# Patient Record
Sex: Male | Born: 1945
Health system: Southern US, Community
[De-identification: ages and names within clinical notes are randomized; demographics above are authoritative.]

## PROBLEM LIST (undated history)

## (undated) DIAGNOSIS — J4489 Other specified chronic obstructive pulmonary disease: Secondary | ICD-10-CM

## (undated) DIAGNOSIS — G4733 Obstructive sleep apnea (adult) (pediatric): Secondary | ICD-10-CM

## (undated) DIAGNOSIS — R0602 Shortness of breath: Secondary | ICD-10-CM

## (undated) DIAGNOSIS — I70213 Atherosclerosis of native arteries of extremities with intermittent claudication, bilateral legs: Secondary | ICD-10-CM

## (undated) DIAGNOSIS — J189 Pneumonia, unspecified organism: Secondary | ICD-10-CM

## (undated) DIAGNOSIS — G629 Polyneuropathy, unspecified: Secondary | ICD-10-CM

## (undated) DIAGNOSIS — J9611 Chronic respiratory failure with hypoxia: Secondary | ICD-10-CM

## (undated) DIAGNOSIS — I2510 Atherosclerotic heart disease of native coronary artery without angina pectoris: Secondary | ICD-10-CM

## (undated) DIAGNOSIS — J449 Chronic obstructive pulmonary disease, unspecified: Secondary | ICD-10-CM

## (undated) DIAGNOSIS — R600 Localized edema: Secondary | ICD-10-CM

## (undated) DIAGNOSIS — Z955 Presence of coronary angioplasty implant and graft: Secondary | ICD-10-CM

## (undated) DIAGNOSIS — G709 Myoneural disorder, unspecified: Secondary | ICD-10-CM

## (undated) DIAGNOSIS — G4730 Sleep apnea, unspecified: Secondary | ICD-10-CM

## (undated) DIAGNOSIS — I10 Essential (primary) hypertension: Secondary | ICD-10-CM

## (undated) DIAGNOSIS — M48061 Spinal stenosis, lumbar region without neurogenic claudication: Secondary | ICD-10-CM

## (undated) DIAGNOSIS — H269 Unspecified cataract: Secondary | ICD-10-CM

## (undated) DIAGNOSIS — I739 Peripheral vascular disease, unspecified: Secondary | ICD-10-CM

## (undated) DIAGNOSIS — E119 Type 2 diabetes mellitus without complications: Secondary | ICD-10-CM

## (undated) DIAGNOSIS — E785 Hyperlipidemia, unspecified: Secondary | ICD-10-CM

## (undated) DIAGNOSIS — I509 Heart failure, unspecified: Secondary | ICD-10-CM

## (undated) DIAGNOSIS — M1990 Unspecified osteoarthritis, unspecified site: Secondary | ICD-10-CM

## (undated) DIAGNOSIS — L719 Rosacea, unspecified: Secondary | ICD-10-CM

## (undated) DIAGNOSIS — Z9989 Dependence on other enabling machines and devices: Secondary | ICD-10-CM

## (undated) DIAGNOSIS — I5032 Chronic diastolic (congestive) heart failure: Secondary | ICD-10-CM

## (undated) HISTORY — PX: HAMMER TOE SURGERY: SHX385

## (undated) HISTORY — PX: CORONARY ANGIOPLASTY: SHX604

## (undated) HISTORY — DX: Myoneural disorder, unspecified: G70.9

## (undated) HISTORY — DX: Obstructive sleep apnea (adult) (pediatric): G47.33

## (undated) HISTORY — PX: BACK SURGERY: SHX140

## (undated) HISTORY — PX: COLONOSCOPY W/ BIOPSIES AND POLYPECTOMY: SHX1376

## (undated) HISTORY — DX: Chronic respiratory failure with hypoxia: J96.11

## (undated) HISTORY — DX: Rosacea, unspecified: L71.9

## (undated) HISTORY — DX: Chronic obstructive pulmonary disease, unspecified: J44.9

## (undated) HISTORY — PX: CARDIAC CATHETERIZATION: SHX172

## (undated) HISTORY — DX: Sleep apnea, unspecified: G47.30

## (undated) HISTORY — PX: LUMBAR DISC SURGERY: SHX700

## (undated) HISTORY — DX: Peripheral vascular disease, unspecified: I73.9

## (undated) HISTORY — PX: BUNIONECTOMY: SHX129

## (undated) HISTORY — PX: LUMBAR SPINE SURGERY: SHX701

## (undated) HISTORY — DX: Unspecified cataract: H26.9

---

## 1963-12-28 HISTORY — PX: TONSILLECTOMY: SUR1361

## 1994-12-27 HISTORY — PX: NEUROMA SURGERY: SHX722

## 1997-12-27 DIAGNOSIS — J189 Pneumonia, unspecified organism: Secondary | ICD-10-CM

## 1997-12-27 HISTORY — DX: Pneumonia, unspecified organism: J18.9

## 1999-03-26 HISTORY — PX: OTHER SURGICAL HISTORY: SHX169

## 1999-03-26 HISTORY — PX: CARDIAC CATHETERIZATION: SHX172

## 2000-06-02 HISTORY — PX: ANGIOPLASTY: SHX39

## 2000-06-02 HISTORY — PX: CORONARY ANGIOPLASTY WITH STENT PLACEMENT: SHX49

## 2001-10-27 HISTORY — PX: CERVICAL FUSION: SHX112

## 2001-10-27 HISTORY — PX: ANTERIOR CERVICAL DECOMP/DISCECTOMY FUSION: SHX1161

## 2004-04-21 HISTORY — PX: SPINE SURGERY: SHX786

## 2006-09-30 HISTORY — PX: CARPAL TUNNEL RELEASE: SHX101

## 2008-04-29 ENCOUNTER — Encounter: Admission: RE | Admit: 2008-04-29 | Discharge: 2008-04-29 | Payer: Self-pay

## 2009-09-26 HISTORY — PX: HAND ARTHROPLASTY: SHX968

## 2009-09-26 HISTORY — PX: FINGER ARTHRODESIS: SHX5000

## 2012-05-18 ENCOUNTER — Other Ambulatory Visit: Payer: Self-pay | Admitting: Chiropractic Medicine

## 2012-05-18 ENCOUNTER — Ambulatory Visit
Admission: RE | Admit: 2012-05-18 | Discharge: 2012-05-18 | Disposition: A | Discharge status: Home / Self Care | Payer: BC Managed Care – PPO | Source: Ambulatory Visit | Attending: Chiropractic Medicine | Admitting: Chiropractic Medicine

## 2012-05-18 DIAGNOSIS — R52 Pain, unspecified: Secondary | ICD-10-CM

## 2012-08-02 ENCOUNTER — Encounter (HOSPITAL_COMMUNITY): Payer: Self-pay

## 2012-08-02 ENCOUNTER — Ambulatory Visit (HOSPITAL_COMMUNITY)
Admission: RE | Admit: 2012-08-02 | Discharge: 2012-08-02 | Disposition: A | Discharge status: Home / Self Care | Payer: MEDICARE | Source: Ambulatory Visit | Attending: Internal Medicine | Admitting: Internal Medicine

## 2012-08-02 VITALS — BP 154/62 | HR 95 | Ht 70.0 in | Wt 283.8 lb

## 2012-08-02 DIAGNOSIS — J9610 Chronic respiratory failure, unspecified whether with hypoxia or hypercapnia: Secondary | ICD-10-CM

## 2012-08-02 DIAGNOSIS — R0602 Shortness of breath: Secondary | ICD-10-CM

## 2012-08-02 DIAGNOSIS — R0902 Hypoxemia: Secondary | ICD-10-CM

## 2012-08-02 DIAGNOSIS — Z6841 Body Mass Index (BMI) 40.0 and over, adult: Secondary | ICD-10-CM

## 2012-08-02 DIAGNOSIS — I2510 Atherosclerotic heart disease of native coronary artery without angina pectoris: Secondary | ICD-10-CM

## 2012-08-02 DIAGNOSIS — J9611 Chronic respiratory failure with hypoxia: Secondary | ICD-10-CM

## 2012-08-02 HISTORY — DX: Hyperlipidemia, unspecified: E78.5

## 2012-08-02 HISTORY — DX: Type 2 diabetes mellitus without complications: E11.9

## 2012-08-02 HISTORY — DX: Atherosclerotic heart disease of native coronary artery without angina pectoris: I25.10

## 2012-08-02 HISTORY — DX: Heart failure, unspecified: I50.9

## 2012-08-02 HISTORY — DX: Spinal stenosis, lumbar region without neurogenic claudication: M48.061

## 2012-08-02 NOTE — Progress Notes (Signed)
PCP: Creola Corn, MD  HPI:  Anthony [name] is a 66 y/o male with h/o obesity, HTN, HL, severe osteoarthritis, DM2 (diagnosed 2009), OSA (intolerant of CPAP), former smoker (quit 1989), CAD s/p stent 2001.  Recently moved here from [place] and here to establish cardiac care. Had cath in 2001. Was told he had 70% blockage in one artery and the one in the back was totally blocked. Eventually underwent stenting of the 70%. No caths since. Had stress test every year. Last one was May 2012. Was told it was normal.  Overall doing fairly well. BP and cholesterol have been well controlled. Has struggled with severe orthopedic issues. Is to the point now where does almost no exercise. Failed sleep study many years ago. Tried several masks but couldn't tolerate. Very fatigued during the day. + excessive daytime sleepiness. Wife says he gurgles a lot at night and has witnessed apnea.   No CP. Occasional mild tightness. Dyspnea at moderate activity. Feels like his limitation getting worse. Occasional edema. No orthopnea or PND.     Review of Systems:     Cardiac Review of Systems: {Y] = yes [ ]  = no  Chest Pain [    ]  Resting SOB [   ] Exertional SOB  Cove.Etienne  ]  Kenese.Mounts [  ]   Pedal Edema Cove.Etienne   ]    Palpitations [  ] Syncope  [  ]   Presyncope [   ]  General Review of Systems: [Y] = yes [  ]=no Constitional: recent weight change [  ]; anorexia [  ]; fatigue Cove.Etienne  ]; nausea [  ]; night sweats [  ]; fever [  ]; or chills [  ];                                                                                                                                          Dental: poor dentition[  ];   Eye : blurred vision [  ]; diplopia [   ]; vision changes [  ];  Amaurosis fugax[  ]; Resp: cough [  ];  wheezing[  ];  hemoptysis[  ]; shortness of breath[  y]; paroxysmal nocturnal dyspnea[  ]; dyspnea on exertion[ y ]; or orthopnea[  ];  GI:  gallstones[  ], vomiting[  ];  dysphagia[  ]; melena[  ];  hematochezia [  ];  heartburn[  ];   Hx of  Colonoscopy[  ]; GU: kidney stones [  ]; hematuria[  ];   dysuria [  ];  nocturia[  ];  history of     obstruction [  ];                 Skin: rash, swelling[  ];, hair loss[  ];  peripheral edema[  ];  or itching[  ]; Musculosketetal: myalgias[  ];  joint swelling[  ];  joint erythema[  ];  joint pain[y  ];  back pain[y  ];  Heme/Lymph: bruising[  ];  bleeding[  ];  anemia[  ];  Neuro: TIA[  ];  headaches[  ];  stroke[  ];  vertigo[  ];  seizures[  ];   paresthesias[  ];  difficulty walking[  ];  Psych:depression[  ]; anxiety[  ];  Endocrine: diabetes[ y ];  thyroid dysfunction[  ];   Past Medical History  Diagnosis Date  . CAD (coronary artery disease)     stent placed 06/02/2000  . Lumbar stenosis   . Diabetes mellitus   . Hyperlipidemia   . Congestive heart failure     Current Outpatient Prescriptions  Medication Sig Dispense Refill  . aspirin 325 MG tablet Take 325 mg by mouth daily.      Marland Kitchen atenolol (TENORMIN) 50 MG tablet Take 50 mg by mouth daily.      . B Complex Vitamins (VITAMIN B COMPLEX PO) Take by mouth. otc      . doxazosin (CARDURA) 4 MG tablet Take 4 mg by mouth at bedtime.      Marland Kitchen doxycycline (ADOXA) 100 MG tablet Take 250 mg by mouth daily.      . fish oil-omega-3 fatty acids 1000 MG capsule Take 2 g by mouth daily. OTC      . furosemide (LASIX) 20 MG tablet Take 20 mg by mouth 2 (two) times daily.      Marland Kitchen glipiZIDE (GLUCOTROL XL) 5 MG 24 hr tablet Take 5 mg by mouth daily.      . isosorbide dinitrate (ISORDIL) 30 MG tablet Take 30 mg by mouth 4 (four) times daily.      . isosorbide mononitrate (ISMO,MONOKET) 10 MG tablet Take 30 mg by mouth daily.      Marland Kitchen L-Lysine 1000 MG TABS Take by mouth. otc      . Liraglutide 18 MG/3ML SOLN Inject 18 mg into the skin daily.      Marland Kitchen losartan (COZAAR) 100 MG tablet Take 100 mg by mouth daily.      . metFORMIN (GLUCOPHAGE) 1000 MG tablet Take 1,000 mg by mouth 2 (two) times daily with a meal.      . NON  FORMULARY Cinammon OTC      . NON FORMULARY Garlic Supplement      . omeprazole (PRILOSEC) 20 MG capsule Take 20 mg by mouth daily.      . pioglitazone (ACTOS) 15 MG tablet Take 15 mg by mouth daily.      . potassium chloride SA (K-DUR,KLOR-CON) 20 MEQ tablet Take 20 mEq by mouth 2 (two) times daily.      Marland Kitchen rOPINIRole (REQUIP) 1 MG tablet Take 1 mg by mouth at bedtime.      . simvastatin (ZOCOR) 40 MG tablet Take 40 mg by mouth every evening.         No Known Allergies  History   Social History  . Marital Status: Married    Spouse Name: N/A    Number of Children: N/A  . Years of Education: N/A   Occupational History  . retired    Social History Main Topics  . Smoking status: Former Smoker    Quit date: 08/03/1987  . Smokeless tobacco: Not on file  . Alcohol Use: Not on file  . Drug Use: Not on file  . Sexually Active: Not on file   Other Topics Concern  . Not on file   Social History  Narrative  . No narrative on file    Family History  Problem Relation Age of Onset  . Heart disease Father 55    PHYSICAL EXAM: Filed Vitals:   08/02/12 1112  BP: 154/62  Pulse: 95  Height: 5\' 10"  (1.778 m)  Weight: 283 lb 12.8 oz (128.731 kg)  SpO2: 90%   Sats down to 79%on hall walk with me  General:  No acute distress.. No respiratory difficulty HEENT: normal + rosacea  Neck: supple. no JVD. Carotids 2+ bilat; no bruits. No lymphadenopathy or thryomegaly appreciated. Cor: PMI nondisplaced. Regular rate & rhythm. No rubs, gallops or murmurs. Lungs: clear with diminished  Air movment throughout Abdomen: soft, obese nontender, nondistended. No bruits or masses. Good bowel sounds. Extremities: no cyanosis, clubbing, rash, 1-2+ L > R with stasis dermatitis Neuro: alert & oriented x 3, cranial nerves grossly intact. moves all 4 extremities w/o difficulty. Affect pleasant.  ECG: NSR 80 No ST-T wave abnormalities.    ASSESSMENT & PLAN:

## 2012-08-02 NOTE — Progress Notes (Signed)
Pt's O2 sat 90% on RA, pt ambulated in hallway and O2 sat dropped to 79%, oxygen at 2 L [place] was placed and O2 sat returned to 94%

## 2012-08-02 NOTE — Patient Instructions (Addendum)
You have been referred to [facility] for Home Oxygen  You have been referred to Dr Craige Cotta at [facility]  Your physician has requested that you have an echocardiogram. Echocardiography is a painless test that uses sound waves to create images of your heart. It provides your doctor with information about the size and shape of your heart and how well your heart's chambers and valves are working. This procedure takes approximately one hour. There are no restrictions for this procedure.  Your physician recommends that you schedule a follow-up appointment in: 4-6 weeks with Dr Gala Romney

## 2012-08-03 ENCOUNTER — Telehealth: Payer: Self-pay | Admitting: *Deleted

## 2012-08-03 NOTE — Telephone Encounter (Signed)
I spoke with pt and is scheduled to come in 08/11/12 at 1:30 to see Dr. Craige Cotta

## 2012-08-03 NOTE — Telephone Encounter (Signed)
Message copied by Tommie Sams on Thu Aug 03, 2012  8:40 AM ------      Message from: [name]      Created: Wed Aug 02, 2012  3:44 PM       Can you schedule Mr. Misko for consult visit.  I spoke with cardiology about him, so already have a lot of info.  Should be able to double book visit.  Would like to schedule in next 1 to 2 weeks.            Thanks.

## 2012-08-11 ENCOUNTER — Ambulatory Visit (INDEPENDENT_AMBULATORY_CARE_PROVIDER_SITE_OTHER): Payer: BC Managed Care – PPO | Admitting: Pulmonary Disease

## 2012-08-11 ENCOUNTER — Ambulatory Visit (INDEPENDENT_AMBULATORY_CARE_PROVIDER_SITE_OTHER)
Admission: RE | Admit: 2012-08-11 | Discharge: 2012-08-11 | Disposition: A | Discharge status: Home / Self Care | Payer: BC Managed Care – PPO | Source: Ambulatory Visit | Attending: Pulmonary Disease | Admitting: Pulmonary Disease

## 2012-08-11 ENCOUNTER — Encounter: Payer: Self-pay | Admitting: Pulmonary Disease

## 2012-08-11 VITALS — BP 122/64 | HR 66 | Temp 98.1°F | Ht 69.0 in | Wt 273.0 lb

## 2012-08-11 DIAGNOSIS — J9611 Chronic respiratory failure with hypoxia: Secondary | ICD-10-CM | POA: Insufficient documentation

## 2012-08-11 DIAGNOSIS — R0602 Shortness of breath: Secondary | ICD-10-CM

## 2012-08-11 DIAGNOSIS — R0902 Hypoxemia: Secondary | ICD-10-CM

## 2012-08-11 DIAGNOSIS — Z6841 Body Mass Index (BMI) 40.0 and over, adult: Secondary | ICD-10-CM

## 2012-08-11 DIAGNOSIS — J449 Chronic obstructive pulmonary disease, unspecified: Secondary | ICD-10-CM

## 2012-08-11 DIAGNOSIS — J9610 Chronic respiratory failure, unspecified whether with hypoxia or hypercapnia: Secondary | ICD-10-CM

## 2012-08-11 DIAGNOSIS — G4733 Obstructive sleep apnea (adult) (pediatric): Secondary | ICD-10-CM

## 2012-08-11 DIAGNOSIS — J986 Disorders of diaphragm: Secondary | ICD-10-CM

## 2012-08-11 HISTORY — DX: Obstructive sleep apnea (adult) (pediatric): G47.33

## 2012-08-11 HISTORY — DX: Chronic obstructive pulmonary disease, unspecified: J44.9

## 2012-08-11 LAB — PULMONARY FUNCTION TEST

## 2012-08-11 MED ORDER — BUDESONIDE-FORMOTEROL FUMARATE 160-4.5 MCG/ACT IN AERO: 2.0000 | INHALATION_SPRAY | Freq: Two times a day (BID) | RESPIRATORY_TRACT | Status: DC

## 2012-08-11 NOTE — Assessment & Plan Note (Signed)
He has extensive history of smoking.  He has moderate obstruction on PFT.  He has symptoms suggestive of chronic bronchitis type pattern.  To further assess will arrange for full pulmonary function testing.  Will give him trial of symbicort, and then determine if he needs additional adjustments to his inhaler regimen.  Once he returns from his [place] cruise, will need to assess whether he would benefit from pulmonary rehab.  Will also need to check A1AT levels at some point.

## 2012-08-11 NOTE — Assessment & Plan Note (Signed)
Discussed how his weight is affecting his health.

## 2012-08-11 NOTE — Progress Notes (Signed)
PFT done today. 

## 2012-08-11 NOTE — Assessment & Plan Note (Signed)
I have explained to him the indications for supplemental oxygen.  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

## 2012-08-11 NOTE — Assessment & Plan Note (Signed)
He has snoring, sleep disruption, daytime sleepiness, and witnessed apnea.  He has hx of cardiac disease, COPD, and hypoxia.  He has prior history of sleep apnea.  I am concerned he still has sleep apnea.  I have explained how sleep apnea can affect the patient's health.  Driving precautions and importance of weight loss were discussed.  Treatment options for sleep apnea were reviewed.  To further assess will arrange for in lab sleep study.  Will then also determine how much supplemental oxygen he needs at night.

## 2012-08-11 NOTE — Assessment & Plan Note (Signed)
He has remote history of neck surgery.  I do not have any prior xray's for comparison.  Will assess right hemidiaphragm function with SNIFF test.  He would like benefit from CPAP/BPAP therapy for this.  Will re-assess PAP therapy after review of his sleep study.

## 2012-08-11 NOTE — Patient Instructions (Signed)
Will schedule sleep study, breathing test (PFT), and SNIFF test Symbicort two puffs twice per day, and rinse mouth after each use Follow up in 10 to 14 days

## 2012-08-11 NOTE — Progress Notes (Signed)
Chief Complaint  Patient presents with  . Advice Only     Pt uses 2.5 L at bedtime. Breathing is doing better, cough w/ phlem (not sure what color), some wheezing    History of Present Illness: Anthony [name] is a 66 y.o. male former smoker for evaluation of OSA, COPD, and hypoxic respiratory failure.  He recently moved from [place].  He had recent cardiology evaluation with Dr. Gala Romney.  He was advised that he needed further evaluation for his breathing and sleep.  He tried CPAP years ago, but was not able to adjust to the mask.  He had his sleep study in the 1980's.  His wife reports that he snores, and gurgles while asleep.  He does have trouble feeling sleepy during the day.  He also gets cramps in his legs, and talks in his sleep.    He has been using 2.5 liters oxygen at night, and this helps.  His Epworth score is 14 out of 24.  He gets winded with moderate activity.  He was noted to have oxygen desaturation with exertion in Dr. Prescott Gum office on 08/02/12.  He was started on 2.5 liters oxygen.  He gets cough, and wheeze.  He brings up sputum, but denies hemoptysis.  He does okay on level ground, but gets winded up stairs.  He denies hx of asthma or allergies.    He quit smoking in 1989.  He had pneumonia years ago, but no hx of TB.  He worked as a Social research officer, government for [employer].  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  He moved to [place] this year to be closer to family.  He has not had recent chest xray or PFT's.  He was on an inhaler years ago, but can't recall why.  He had neck surgery in 2002.  Past Medical History  Diagnosis Date  . CAD (coronary artery disease)     stent placed 06/02/2000  . Lumbar stenosis   . Diabetes mellitus   . Hyperlipidemia   . Congestive heart failure     Past Surgical History  Procedure Date  . Tonsillectomy 1965  . Bunionectomy 1991 & 1994    right foot  . Neuroma surgery 1996    right foot  . Lumbar  disc surgery Dec 1997 & Ampril 2005  . Cervical fusion Nov 2002  . Hammer toe surgery June 2007 & August 2008    left foot  . Carpal tunnel release 09/30/2006    right wrist  . Hand arthroplasty Oct 2010    right thumb    Current Outpatient Prescriptions on File Prior to Visit  Medication Sig Dispense Refill  . aspirin 325 MG tablet Take 325 mg by mouth daily.      Marland Kitchen atenolol (TENORMIN) 50 MG tablet Take 50 mg by mouth daily.      . B Complex Vitamins (VITAMIN B COMPLEX PO) Take by mouth. otc      . doxazosin (CARDURA) 4 MG tablet Take 4 mg by mouth at bedtime.      . fish oil-omega-3 fatty acids 1000 MG capsule Take 2 g by mouth daily. OTC      . fluticasone (FLONASE) 50 MCG/ACT nasal spray Place 2 sprays into the nose as needed.      . furosemide (LASIX) 20 MG tablet Take 20 mg by mouth 2 (two) times daily.      Marland Kitchen glipiZIDE (GLUCOTROL XL) 5 MG 24 hr tablet  Take 5 mg by mouth daily.      . isosorbide dinitrate (ISORDIL) 30 MG tablet Take 30 mg by mouth 4 (four) times daily.      . isosorbide mononitrate (ISMO,MONOKET) 10 MG tablet Take 30 mg by mouth daily.      Marland Kitchen L-Lysine 1000 MG TABS Take by mouth. otc      . Liraglutide 18 MG/3ML SOLN Inject 18 mg into the skin daily.      Marland Kitchen losartan (COZAAR) 100 MG tablet Take 100 mg by mouth daily.      . metFORMIN (GLUCOPHAGE) 1000 MG tablet Take 1,000 mg by mouth 2 (two) times daily with a meal.      . NON FORMULARY Cinammon OTC      . NON FORMULARY Garlic Supplement      . omeprazole (PRILOSEC) 20 MG capsule Take 20 mg by mouth 2 (two) times daily.       . potassium chloride SA (K-DUR,KLOR-CON) 20 MEQ tablet Take 20 mEq by mouth 2 (two) times daily.      Marland Kitchen rOPINIRole (REQUIP) 1 MG tablet Take 1 mg by mouth at bedtime.      . simvastatin (ZOCOR) 40 MG tablet Take 40 mg by mouth every evening.        No Known Allergies  Family History  Problem Relation Age of Onset  . Heart disease Father 6    History  Substance Use Topics  . Smoking  status: Former Smoker -- 2.0 packs/day for 28 years    Types: Cigarettes    Quit date: 08/03/1987  . Smokeless tobacco: Not on file  . Alcohol Use: No     Physical Exam: Filed Vitals:   08/11/12 1339  BP: 122/64  Pulse: 66  Temp: 98.1 F (36.7 C)  TempSrc: Oral  Height: 5\' 9"  (1.753 m)  Weight: 273 lb (123.832 kg)  SpO2: 95%  ,  Current Encounter SPO2  08/11/12 1339 95%  08/02/12 1112 90%    Wt Readings from Last 3 Encounters:  08/11/12 273 lb (123.832 kg)  08/02/12 283 lb 12.8 oz (128.731 kg)    Body mass index is 40.32 kg/(m^2).   General - No distress, obese, plethoric appearance ENT - TM clear, no sinus tenderness, no oral exudate, no LAN, no thyromegaly, MP 3 Cardiac - s1s2 regular, no murmur, pulses symmetric, minimal ankle edema Chest - decreased breath sounds, prolonged exhalation, no wheeze/rales/dullness Back - no focal tenderness Abd - soft, non-tender, no organomegaly, + bowel sounds Ext - normal motor strength Neuro - Cranial nerves are normal. PERLA. EOM's intact. Skin - no discernible active dermatitis, erythema, urticaria or inflammatory process. Psych - normal mood, and behavior.  Dg Chest 2 View  08/11/2012  *RADIOLOGY REPORT*  Clinical Data: Dyspnea.  Cough.  Wheezing.  Hypoxia.  CHEST - 2 VIEW  Comparison: None.  Findings: Low lung volumes are seen with elevation of right hemidiaphragm.  Crowding of bronchovascular markings at the lung bases, however both lungs are clear.  There is no evidence of pleural effusion.  Heart size is within normal limits.  Fixation hardware seen in lower cervical spine.  IMPRESSION: Low lung volumes and elevation of right hemidiaphragm.  No active disease.  Original Report Authenticated By: Danae Orleans, M.D.   Spirometry 08/11/12>>FEV1 1.82 (53%), FEV1% 67   Assessment/Plan:  [name], MD [facility]/Sleep Pager:  [phone] 08/11/2012, 1:41 PM

## 2012-08-14 ENCOUNTER — Ambulatory Visit (HOSPITAL_COMMUNITY)
Admission: RE | Admit: 2012-08-14 | Discharge: 2012-08-14 | Disposition: A | Discharge status: Home / Self Care | Payer: MEDICARE | Source: Ambulatory Visit | Attending: Pulmonary Disease | Admitting: Pulmonary Disease

## 2012-08-14 ENCOUNTER — Ambulatory Visit (HOSPITAL_BASED_OUTPATIENT_CLINIC_OR_DEPARTMENT_OTHER): Payer: MEDICARE | Attending: Pulmonary Disease | Admitting: Radiology

## 2012-08-14 VITALS — Ht 69.0 in | Wt 266.0 lb

## 2012-08-14 DIAGNOSIS — R6889 Other general symptoms and signs: Secondary | ICD-10-CM | POA: Insufficient documentation

## 2012-08-14 DIAGNOSIS — G4733 Obstructive sleep apnea (adult) (pediatric): Secondary | ICD-10-CM | POA: Insufficient documentation

## 2012-08-14 DIAGNOSIS — J449 Chronic obstructive pulmonary disease, unspecified: Secondary | ICD-10-CM

## 2012-08-14 DIAGNOSIS — J4489 Other specified chronic obstructive pulmonary disease: Secondary | ICD-10-CM | POA: Insufficient documentation

## 2012-08-21 ENCOUNTER — Encounter: Payer: Self-pay | Admitting: Pulmonary Disease

## 2012-08-21 ENCOUNTER — Ambulatory Visit (INDEPENDENT_AMBULATORY_CARE_PROVIDER_SITE_OTHER): Payer: BC Managed Care – PPO | Admitting: Pulmonary Disease

## 2012-08-21 VITALS — BP 110/56 | HR 61 | Temp 98.8°F | Ht 69.0 in | Wt 267.2 lb

## 2012-08-21 DIAGNOSIS — J449 Chronic obstructive pulmonary disease, unspecified: Secondary | ICD-10-CM

## 2012-08-21 DIAGNOSIS — J9611 Chronic respiratory failure with hypoxia: Secondary | ICD-10-CM

## 2012-08-21 DIAGNOSIS — J986 Disorders of diaphragm: Secondary | ICD-10-CM

## 2012-08-21 DIAGNOSIS — Z6841 Body Mass Index (BMI) 40.0 and over, adult: Secondary | ICD-10-CM

## 2012-08-21 DIAGNOSIS — G4733 Obstructive sleep apnea (adult) (pediatric): Secondary | ICD-10-CM

## 2012-08-21 DIAGNOSIS — J9610 Chronic respiratory failure, unspecified whether with hypoxia or hypercapnia: Secondary | ICD-10-CM

## 2012-08-21 DIAGNOSIS — R0902 Hypoxemia: Secondary | ICD-10-CM

## 2012-08-21 MED ORDER — AEROCHAMBER MV MISC: Status: AC

## 2012-08-21 NOTE — Assessment & Plan Note (Addendum)
He has severe sleep apnea.  I have reviewed his sleep test results with the patient.  Explained how sleep apnea can affect the patient's health.  Driving precautions and importance of weight loss were discussed.  Treatment options for sleep apnea were reviewed.  Will arrange for CPAP 13 cm H2O with 1 liter oxygen.

## 2012-08-21 NOTE — Assessment & Plan Note (Signed)
This is likely related to prior neck surgery with phrenic nerve injury.  Will monitor his status with supplemental oxygen and nocturnal CPAP.

## 2012-08-21 NOTE — Progress Notes (Signed)
Chief Complaint  Patient presents with  . Follow-up    pft, sniff test, and sleep study results. c/o hoarseness since being on symbicort    History of Present Illness: Anthony [name] is a 66 y.o. male former smoker with dyspnea 2nd to COPD, right hemidiaphragm paralysis, chronic hypoxia, and OSA.  He is here to review PFT, SNIFF test, and sleep study.  He has noticed hoarseness since starting symbicort.  He feels this has helped his breathing otherwise.  He is going on trip to [place] later this month.  Past Medical History  Diagnosis Date  . CAD (coronary artery disease)     stent placed 06/02/2000  . Lumbar stenosis   . Diabetes mellitus   . Hyperlipidemia   . Congestive heart failure   . COPD (chronic obstructive pulmonary disease) 08/11/2012  . OSA (obstructive sleep apnea) 08/11/2012  . Chronic respiratory failure with hypoxia 08/11/2012    Past Surgical History  Procedure Date  . Tonsillectomy 1965  . Bunionectomy 1991 & 1994    right foot  . Neuroma surgery 1996    right foot  . Lumbar disc surgery Dec 1997 & Ampril 2005  . Cervical fusion Nov 2002  . Hammer toe surgery June 2007 & August 2008    left foot  . Carpal tunnel release 09/30/2006    right wrist  . Hand arthroplasty Oct 2010    right thumb    Outpatient Encounter Prescriptions as of 08/21/2012  Medication Sig Dispense Refill  . aspirin 325 MG tablet Take 325 mg by mouth daily.      Marland Kitchen atenolol (TENORMIN) 50 MG tablet Take 50 mg by mouth daily.      . B Complex Vitamins (VITAMIN B COMPLEX PO) Take by mouth. otc      . budesonide-formoterol (SYMBICORT) 160-4.5 MCG/ACT inhaler Inhale 2 puffs into the lungs 2 (two) times daily.  1 Inhaler  12  . doxazosin (CARDURA) 4 MG tablet Take 4 mg by mouth at bedtime.      Marland Kitchen doxycycline (ADOXA) 50 MG tablet Take 50 mg by mouth daily.      . fish oil-omega-3 fatty acids 1000 MG capsule Take 2 g by mouth daily. OTC      . fluticasone (FLONASE) 50 MCG/ACT nasal spray Place  2 sprays into the nose as needed.      . furosemide (LASIX) 20 MG tablet Take 20 mg by mouth 2 (two) times daily.      Marland Kitchen glipiZIDE (GLUCOTROL XL) 5 MG 24 hr tablet Take 5 mg by mouth daily.      . isosorbide dinitrate (ISORDIL) 30 MG tablet Take 30 mg by mouth daily.       . isosorbide mononitrate (ISMO,MONOKET) 10 MG tablet Take 30 mg by mouth daily.      Marland Kitchen L-Lysine 1000 MG TABS Take by mouth. otc      . Liraglutide 18 MG/3ML SOLN Inject 12 mg into the skin daily.       Marland Kitchen losartan (COZAAR) 100 MG tablet Take 100 mg by mouth daily.      . metFORMIN (GLUCOPHAGE) 1000 MG tablet Take 1,000 mg by mouth 2 (two) times daily with a meal.      . NON FORMULARY Cinammon OTC      . NON FORMULARY Garlic Supplement      . omeprazole (PRILOSEC) 20 MG capsule Take 20 mg by mouth 2 (two) times daily.       . potassium chloride SA (  K-DUR,KLOR-CON) 20 MEQ tablet Take 20 mEq by mouth 2 (two) times daily.      Marland Kitchen rOPINIRole (REQUIP) 1 MG tablet Take 1 mg by mouth at bedtime.      . simvastatin (ZOCOR) 40 MG tablet Take 40 mg by mouth every evening.        No Known Allergies  Physical Exam:  Filed Vitals:   08/21/12 1343 08/21/12 1345  BP:  110/56  Pulse:  61  Temp: 98.8 F (37.1 C)   TempSrc: Oral   Height: 5\' 9"  (1.753 m)   Weight: 267 lb 3.2 oz (121.201 kg)   SpO2:  91%    Current Encounter SPO2  08/21/12 1345 91%  08/11/12 1339 95%  08/02/12 1112 90%    Body mass index is 39.46 kg/(m^2). Wt Readings from Last 2 Encounters:  08/21/12 267 lb 3.2 oz (121.201 kg)  08/14/12 266 lb (120.657 kg)    General - No distress, obese, plethoric appearance  ENT - TM clear, no sinus tenderness, no oral exudate, no LAN, no thyromegaly, MP 3, no thrush Cardiac - s1s2 regular, no murmur, pulses symmetric, minimal ankle edema  Chest - decreased breath sounds, prolonged exhalation, no wheeze/rales/dullness  Back - no focal tenderness  Abd - soft, non-tender, no organomegaly, + bowel sounds  Ext - normal  motor strength  Neuro - Cranial nerves are normal. PERLA. EOM's intact.  Skin - no discernible active dermatitis, erythema, urticaria or inflammatory process.  Psych - normal mood, and behavior.  Dg Sniff Test  08/14/2012  *RADIOLOGY REPORT*   Clinical Data: Low O2 sats.  Previous neck surgery.   SNIFF TEST  Comparison: None.  Last time:  0.29 minutes.  Findings: The right hemidiaphragm is elevated and shows paradoxical upward motion during inspiration.   IMPRESSION: Paradoxical motion of the right hemidiaphragm with inspiration.    Original Report Authenticated By: Anthony [name], M.D. ( 08/14/2012 12:27:35 )     PFT 08/13/12>>FEV1 1.83 (62%), FEV1% 64, TLC 5.17 (82%), DLCO 67%, +BD  PSG 08/14/12>>AHI 38.5.  CPAP 13 cm H2O with 1 liter oxygen.  Assessment/Plan:  Anthony Helling, MD [place]/Critical Care/Sleep Pager:  [phone] 08/21/2012, 1:46 PM

## 2012-08-21 NOTE — Assessment & Plan Note (Signed)
He is to continue supplemental oxygen at night and with exertion.

## 2012-08-21 NOTE — Patient Instructions (Addendum)
Continue symbicort twice per day with spacer device Will need to arrange for CPAP set up at home Follow up in 6 to 8 weeks

## 2012-08-21 NOTE — Assessment & Plan Note (Addendum)
He has moderate obstruction with bronchodilator response on PFT.    He is to continue symbicort.  Will try adding spacer device.  If this is unsuccessful, would then try switching to spiriva.

## 2012-08-21 NOTE — Assessment & Plan Note (Signed)
Discussed how his weight is affecting his health and his breathing.

## 2012-08-22 ENCOUNTER — Encounter: Payer: Self-pay | Admitting: Pulmonary Disease

## 2012-08-22 DIAGNOSIS — G4733 Obstructive sleep apnea (adult) (pediatric): Secondary | ICD-10-CM

## 2012-08-24 NOTE — Procedures (Signed)
NAME:  Anthony [name], Anthony [name] NO.:  [id number]  MEDICAL RECORD NO.:  [id number]          PATIENT TYPE:  OUT  LOCATION:  SLEEP CENTER                 FACILITY:  [facility]  PHYSICIAN:  [name], MD        DATE OF BIRTH:  12/05/1946  DATE OF STUDY:  08/14/2012                           NOCTURNAL POLYSOMNOGRAM  REFERRING PHYSICIAN:  INDICATION FOR STUDY:  Anthony [name] is a 66 year old male, who has history of coronary disease, hypertension, diabetes, COPD, and chronic hypoxemia.  He also has a history of obstructive sleep apnea from the 1980s.  However he was not able to tolerate CPAP therapy at that time. He continued to have snoring, witnessed apnea, sleep disruption, and daytime sleepiness.  He is referred to sleep lab for evaluation of hypersomnia with obstructive sleep apnea.  Height is 5 feet 9 inches, weight is 266 pounds.  BMI is 39.  Neck size is 18.5 inches.  EPWORTH SLEEPINESS SCORE:  8.  MEDICATIONS:  Cozaar, aspirin, Lasix, Glucotrol, isosorbide, Klor-Con, metformin, Prevacid, atenolol, doxazosin, Lipitor, Ropinirole, doxycycline, fluticasone, fish oil.  SLEEP ARCHITECTURE:  The patient followed a split-night study protocol. During the diagnostic portion of study, total recording time was 179 minutes, total sleep time was 120 minutes.  Sleep efficiency was 67%. Sleep latency was 21 minutes.  REM latency 25 minutes.  This portion of study was notable for lack of slow-wave sleep.  He slept exclusively in the non-supine position.  During the titration portion of study, total recording time was 228 minutes.  Total sleep time was 171 minute.  Sleep efficiency was 75%. Sleep latency was 1 minute.  REM latency was 1 minute.  This portion of the study was notable for lack of slow-wave sleep and he slept in both, supine and non-supine positions.  RESPIRATORY DATA:  The average respiratory rate was 18.  Moderate snoring was noted by the technician.  During the  diagnostic portion of the study, the overall apnea-hypopnea index was 38.5.  There were 2 mixed respiratory events.  The remainder of the events were obstructive in nature.  During the titration portion of study, the patient was started on CPAP of 5 and increased to 17 cm of water.  With CPAP set at 13 cm of water, the apnea-hypopnea index was reduced to 3.  At this pressure setting, he was observed in REM sleep and supine sleep.  OXYGEN DATA:  The patient's baseline oxygenation was 90%.  The oxygen saturation nadir was 69%.  The patient had reasonable control of his oxygenation with CPAP at 13 cm of water with the addition of 1 L of supplement oxygen.  CARDIAC DATA:  The average heart rate was 66 and the rhythm strip showed normal sinus rhythm.  MOVEMENT-PARASOMNIA:  The patient had 1 restroom trip.  The periodic limb movement index was 13 during the diagnostic portion of study, and 10 during the titration portion of study.  IMPRESSIONS-RECOMMENDATIONS:  This study shows evidence for severe obstructive sleep apnea with an apnea-hypopnea index of 38.5, an oxygen saturation nadir of 69%.  He had good control of his obstructive sleep apnea with the use of CPAP at 13 cm of  water.  He required the use of 1 L supplemental oxygen to maintain his oxygenation.  This would be consistent with sleep-related hyperventilation.  In addition to diet, exercise, and weight reduction, I would recommend the patient be started on CPAP at 13 cm of water with the use of 1 L supplemental oxygen.  The patient then be monitored for his clinical response.  He may need to have overnight oximetry done as an outpatient to determine if he needs further adjustments in his total oxygen setting.     [name], MD Diplomat, American Board of Sleep Medicine    VS/MEDQ  D:  08/22/2012 10:56:48  T:  08/23/2012 04:21:58  Job:  [id number]

## 2012-08-26 DIAGNOSIS — I2510 Atherosclerotic heart disease of native coronary artery without angina pectoris: Secondary | ICD-10-CM | POA: Insufficient documentation

## 2012-08-26 NOTE — Assessment & Plan Note (Signed)
Stable. No evidence of ischemia. Recent stress test with previous cardiologist reportedly normal. Needs aggressive RF management.

## 2012-08-26 NOTE — Assessment & Plan Note (Signed)
Stressed need for weight loss. We discussed various strategies. I suggested he consider the [employer].

## 2012-08-26 NOTE — Assessment & Plan Note (Addendum)
He has severe exertional hypoxemia likely due to underlying COPD, severe OSA and OHS. I had an extensive discussion with him and his wife about this. Will order home O2 and refer to Dr. Craige Cotta in Pulmonary for PFTs, sleep study and treatment of his lung disease. (I called Dr. Craige Cotta to discuss personally).  I stressed the need for Anthony [name] to lose weight. Will check echo to evaluate RV and LV function as well as PA pressures.

## 2012-08-30 ENCOUNTER — Encounter (HOSPITAL_BASED_OUTPATIENT_CLINIC_OR_DEPARTMENT_OTHER): Payer: BC Managed Care – PPO

## 2012-09-19 ENCOUNTER — Other Ambulatory Visit (HOSPITAL_COMMUNITY): Payer: BC Managed Care – PPO

## 2012-09-19 ENCOUNTER — Encounter (HOSPITAL_COMMUNITY): Payer: BC Managed Care – PPO

## 2012-10-09 ENCOUNTER — Ambulatory Visit (HOSPITAL_COMMUNITY)
Admission: RE | Admit: 2012-10-09 | Discharge: 2012-10-09 | Disposition: A | Discharge status: Home / Self Care | Payer: MEDICARE | Source: Ambulatory Visit | Attending: Internal Medicine | Admitting: Internal Medicine

## 2012-10-09 ENCOUNTER — Ambulatory Visit (HOSPITAL_BASED_OUTPATIENT_CLINIC_OR_DEPARTMENT_OTHER)
Admission: RE | Admit: 2012-10-09 | Discharge: 2012-10-09 | Disposition: A | Discharge status: Home / Self Care | Payer: MEDICARE | Source: Ambulatory Visit | Attending: Internal Medicine | Admitting: Internal Medicine

## 2012-10-09 VITALS — BP 118/58 | HR 68 | Wt 247.0 lb

## 2012-10-09 DIAGNOSIS — J9611 Chronic respiratory failure with hypoxia: Secondary | ICD-10-CM

## 2012-10-09 DIAGNOSIS — I509 Heart failure, unspecified: Secondary | ICD-10-CM

## 2012-10-09 DIAGNOSIS — E119 Type 2 diabetes mellitus without complications: Secondary | ICD-10-CM | POA: Insufficient documentation

## 2012-10-09 DIAGNOSIS — I739 Peripheral vascular disease, unspecified: Secondary | ICD-10-CM

## 2012-10-09 DIAGNOSIS — R0602 Shortness of breath: Secondary | ICD-10-CM | POA: Insufficient documentation

## 2012-10-09 DIAGNOSIS — I517 Cardiomegaly: Secondary | ICD-10-CM | POA: Insufficient documentation

## 2012-10-09 DIAGNOSIS — R0902 Hypoxemia: Secondary | ICD-10-CM

## 2012-10-09 DIAGNOSIS — J9610 Chronic respiratory failure, unspecified whether with hypoxia or hypercapnia: Secondary | ICD-10-CM

## 2012-10-09 DIAGNOSIS — I10 Essential (primary) hypertension: Secondary | ICD-10-CM | POA: Insufficient documentation

## 2012-10-09 DIAGNOSIS — I2510 Atherosclerotic heart disease of native coronary artery without angina pectoris: Secondary | ICD-10-CM

## 2012-10-09 DIAGNOSIS — Z6841 Body Mass Index (BMI) 40.0 and over, adult: Secondary | ICD-10-CM

## 2012-10-09 MED ORDER — BISOPROLOL FUMARATE 5 MG PO TABS: 5.0000 mg | ORAL_TABLET | Freq: Every day | ORAL | Status: DC

## 2012-10-09 NOTE — Assessment & Plan Note (Signed)
No evidence of ischemia. Given COPD with reactive airway will switch atenolol 50 daily to bisoprolol 5 daily to minimize bronchoconstriction.

## 2012-10-09 NOTE — Assessment & Plan Note (Signed)
He is doing great with his weight loss. I congratulated him and urged him to continue. Exercise seems limited by claudication (at least in part) - will get ABIs.

## 2012-10-09 NOTE — Assessment & Plan Note (Signed)
Much improved with weight loss and CPAP. Can use O2 as needed.

## 2012-10-09 NOTE — Assessment & Plan Note (Signed)
Check ABIs. 

## 2012-10-09 NOTE — Progress Notes (Signed)
Patient ID: Anthony [name], male   DOB: [birth year], 66 y.o.   MRN: [id number] PCP: Anthony Corn, MD  HPI:  Anthony [name] is a 66 y/o male with h/o obesity, HTN, HL, severe osteoarthritis, DM2 (diagnosed 2009), OSA (intolerant of CPAP), former smoker (quit 1989), CAD s/p stent 2001.  Recently moved here from [place] and here to establish cardiac care. Had cath in 2001. Was told he had 70% blockage in one artery and the one in the back was totally blocked. Eventually underwent stenting of the 70%. No caths since. Had stress test every year. Last one was May 2012. Was told it was normal.  We saw him a few months ago. Had exertional desaturations. Started on home O2. Scheduled f/u with Anthony [name].  PFTs with moderate COPD FEV1 1.83 (62%) FVC   2.84 (66%) FEF 25-75% 0.79 (29%) DLCO 67%  CXR with elevated R hemidiaphragm.  Sleep study with severe OS - AHI 39   Feeling much better. Using paleo diet. Weight down 45 pounds. Using CPAP regularly. Only wearing O2 at night. No CP, edema, orthopnea, PND. Still not on exercise program. Has bilateral calf pain which limits his walking.   ECHO reviewed with him personally. EF 55%. RV moderately dilated but normal function. No significant TR jet to measure PA pressure.  Past Medical History  Diagnosis Date  . CAD (coronary artery disease)     stent placed 06/02/2000  . Lumbar stenosis   . Diabetes mellitus   . Hyperlipidemia   . Congestive heart failure   . COPD (chronic obstructive pulmonary disease) 08/11/2012  . OSA (obstructive sleep apnea) 08/11/2012  . Chronic respiratory failure with hypoxia 08/11/2012    Current Outpatient Prescriptions  Medication Sig Dispense Refill  . aspirin 325 MG tablet Take 325 mg by mouth daily.      Marland Kitchen atenolol (TENORMIN) 50 MG tablet Take 50 mg by mouth daily.      . B Complex Vitamins (VITAMIN B COMPLEX PO) Take by mouth. otc      . budesonide-formoterol (SYMBICORT) 160-4.5 MCG/ACT inhaler Inhale 2 puffs into the lungs 2  (two) times daily.  1 Inhaler  12  . doxazosin (CARDURA) 4 MG tablet Take 4 mg by mouth at bedtime.      Marland Kitchen doxycycline (ADOXA) 50 MG tablet Take 50 mg by mouth daily.      . fish oil-omega-3 fatty acids 1000 MG capsule Take 2 g by mouth daily. OTC      . fluticasone (FLONASE) 50 MCG/ACT nasal spray Place 2 sprays into the nose as needed.      . furosemide (LASIX) 20 MG tablet Take 20 mg by mouth 2 (two) times daily.      Marland Kitchen glipiZIDE (GLUCOTROL XL) 5 MG 24 hr tablet Take 5 mg by mouth daily.      . isosorbide dinitrate (ISORDIL) 30 MG tablet Take 30 mg by mouth daily.       . isosorbide mononitrate (ISMO,MONOKET) 10 MG tablet Take 30 mg by mouth daily.      Marland Kitchen L-Lysine 1000 MG TABS Take by mouth. otc      . Liraglutide 18 MG/3ML SOLN Inject 12 mg into the skin daily.       Marland Kitchen losartan (COZAAR) 100 MG tablet Take 100 mg by mouth daily.      . metFORMIN (GLUCOPHAGE) 1000 MG tablet Take 1,000 mg by mouth 2 (two) times daily with a meal.      . NON FORMULARY Cinammon OTC      .  NON FORMULARY Garlic Supplement      . omeprazole (PRILOSEC) 20 MG capsule Take 20 mg by mouth 2 (two) times daily.       . potassium chloride SA (K-DUR,KLOR-CON) 20 MEQ tablet Take 20 mEq by mouth 2 (two) times daily.      Marland Kitchen rOPINIRole (REQUIP) 1 MG tablet Take 1 mg by mouth at bedtime.      . simvastatin (ZOCOR) 40 MG tablet Take 40 mg by mouth every evening.      Marland Kitchen Spacer/Aero-Holding Chambers (AEROCHAMBER MV) inhaler Use as instructed  1 each  0     No Known Allergies  History   Social History  . Marital Status: Married    Spouse Name: N/A    Number of Children: N/A  . Years of Education: N/A   Occupational History  . retired    Social History Main Topics  . Smoking status: Former Smoker -- 2.0 packs/day for 28 years    Types: Cigarettes    Quit date: 08/03/1987  . Smokeless tobacco: Not on file  . Alcohol Use: No  . Drug Use: No  . Sexually Active: Not on file   Other Topics Concern  . Not on file     Social History Narrative  . No narrative on file    Family History  Problem Relation Age of Onset  . Heart disease Father 24    PHYSICAL EXAM: Filed Vitals:   10/09/12 1347  BP: 118/58  Pulse: 68  Weight: 247 lb (112.038 kg)  SpO2: 90%   Brisk hall walk today sats dropped to 89% at peak  General:  No acute distress.. No respiratory difficulty HEENT: normal + mild rosacea  Neck: supple. no JVD. Carotids 2+ bilat; no bruits. No lymphadenopathy or thryomegaly appreciated. Cor: PMI nondisplaced. Regular rate & rhythm. No rubs, gallops or murmurs. Lungs: clear with mildly diminished  air movment throughout Abdomen: soft, obese nontender, nondistended. No bruits or masses. Good bowel sounds. Extremities: no cyanosis, clubbing, rash, no edema. +stasis dermatitis  DP pulses non-palpable Neuro: alert & oriented x 3, cranial nerves grossly intact. moves all 4 extremities w/o difficulty. Affect pleasant.    ASSESSMENT & PLAN:

## 2012-10-09 NOTE — Patient Instructions (Addendum)
Stop Atenolol  Start Bisoprolol 5 mg daily  Your physician has requested that you have an ankle brachial index (ABI). During this test an ultrasound and blood pressure cuff are used to evaluate the arteries that supply the arms and legs with blood. Allow thirty minutes for this exam. There are no restrictions or special instructions.  We will contact you in 3 months to schedule your next appointment.

## 2012-10-09 NOTE — Progress Notes (Signed)
  Echocardiogram 2D Echocardiogram has been performed.  Eyob Godlewski 10/09/2012, 1:44 PM

## 2012-10-10 ENCOUNTER — Encounter (INDEPENDENT_AMBULATORY_CARE_PROVIDER_SITE_OTHER): Payer: MEDICARE

## 2012-10-10 ENCOUNTER — Other Ambulatory Visit: Payer: Self-pay | Admitting: Cardiology

## 2012-10-10 DIAGNOSIS — I739 Peripheral vascular disease, unspecified: Secondary | ICD-10-CM

## 2012-10-10 DIAGNOSIS — I70219 Atherosclerosis of native arteries of extremities with intermittent claudication, unspecified extremity: Secondary | ICD-10-CM

## 2012-10-11 ENCOUNTER — Encounter: Payer: Self-pay | Admitting: Pulmonary Disease

## 2012-10-11 ENCOUNTER — Ambulatory Visit: Payer: MEDICARE | Admitting: Cardiovascular Disease

## 2012-10-11 ENCOUNTER — Ambulatory Visit (INDEPENDENT_AMBULATORY_CARE_PROVIDER_SITE_OTHER): Payer: MEDICARE | Admitting: Pulmonary Disease

## 2012-10-11 VITALS — BP 104/52 | HR 71 | Temp 97.7°F | Ht 69.0 in | Wt 248.0 lb

## 2012-10-11 DIAGNOSIS — J986 Disorders of diaphragm: Secondary | ICD-10-CM

## 2012-10-11 DIAGNOSIS — R0902 Hypoxemia: Secondary | ICD-10-CM

## 2012-10-11 DIAGNOSIS — J449 Chronic obstructive pulmonary disease, unspecified: Secondary | ICD-10-CM

## 2012-10-11 DIAGNOSIS — J9611 Chronic respiratory failure with hypoxia: Secondary | ICD-10-CM

## 2012-10-11 DIAGNOSIS — J9610 Chronic respiratory failure, unspecified whether with hypoxia or hypercapnia: Secondary | ICD-10-CM

## 2012-10-11 DIAGNOSIS — G4733 Obstructive sleep apnea (adult) (pediatric): Secondary | ICD-10-CM

## 2012-10-11 MED ORDER — MOMETASONE FURO-FORMOTEROL FUM 100-5 MCG/ACT IN AERO: 2.0000 | INHALATION_SPRAY | Freq: Two times a day (BID) | RESPIRATORY_TRACT | Status: DC

## 2012-10-11 NOTE — Patient Instructions (Signed)
Stop symbicort Dulera two puffs twice per day, and rinse mouth after each use Follow up in January 2014

## 2012-10-11 NOTE — Assessment & Plan Note (Signed)
This is likely related to prior neck surgery with phrenic nerve injury.  Continue supplemental oxygen and nocturnal CPAP.

## 2012-10-11 NOTE — Assessment & Plan Note (Signed)
He has benefit from LABA/ICS.  He has BD responsiveness on recent PFT.  His symptoms are more consistent with chronic bronchitis.  He has hoarseness for using symbicort that has not improved with spacer.  Will change him to dulera.  If this does not help, then could try spiriva or nebulized pulmicort.

## 2012-10-11 NOTE — Assessment & Plan Note (Signed)
He is compliant with therapy and reports benefit.  He is to continue CPAP 13 cm H2O with 1 liter oxygen.

## 2012-10-11 NOTE — Assessment & Plan Note (Signed)
He is to continue supplemental oxygen at night and with exertion as needed.

## 2012-10-11 NOTE — Progress Notes (Signed)
Chief Complaint  Patient presents with  . Follow-up    breathing is better, slight cough-can't bring the phlem up. denies any wheezing, chest tx. c/o hoarseness d/t symbicort    History of Present Illness: Anthony [name] is a 66 y.o. male former smoker with dyspnea 2nd to COPD, right hemidiaphragm paralysis, chronic hypoxia, and OSA.  His breathing is doing better.  He still has hoarseness and globus since starting symbicort.    He is sleeping better with CPAP.  Tests: PFT 08/13/12>>FEV1 1.83 (62%), FEV1% 64, TLC 5.17 (82%), DLCO 67%, +BD SNIFF test 08/14/12>>Paradoxical motion of the right hemidiaphragm with inspiration.  PSG 08/14/12>>AHI 38.5.  CPAP 13 cm H2O with 1 liter oxygen. Echo 10/09/12>>mild LVH, EF 50 to 55%, grade 1 diastolic dysfx, mild LA dilation, PAS 35 mmHg  Past Medical History  Diagnosis Date  . CAD (coronary artery disease)     stent placed 06/02/2000  . Lumbar stenosis   . Diabetes mellitus   . Hyperlipidemia   . Congestive heart failure   . COPD (chronic obstructive pulmonary disease) 08/11/2012  . OSA (obstructive sleep apnea) 08/11/2012  . Chronic respiratory failure with hypoxia 08/11/2012    Past Surgical History  Procedure Date  . Tonsillectomy 1965  . Bunionectomy 1991 & 1994    right foot  . Neuroma surgery 1996    right foot  . Lumbar disc surgery Dec 1997 & Ampril 2005  . Cervical fusion Nov 2002  . Hammer toe surgery June 2007 & August 2008    left foot  . Carpal tunnel release 09/30/2006    right wrist  . Hand arthroplasty Oct 2010    right thumb    Outpatient Encounter Prescriptions as of 10/11/2012  Medication Sig Dispense Refill  . aspirin 325 MG tablet Take 325 mg by mouth daily.      . B Complex Vitamins (VITAMIN B COMPLEX PO) Take by mouth. otc      . bisoprolol (ZEBETA) 5 MG tablet Take 1 tablet (5 mg total) by mouth daily.  90 tablet  1  . doxazosin (CARDURA) 4 MG tablet Take 4 mg by mouth at bedtime.      Marland Kitchen doxycycline (ADOXA)  50 MG tablet Take 50 mg by mouth daily.      . fish oil-omega-3 fatty acids 1000 MG capsule Take 2 g by mouth daily. OTC      . fluticasone (FLONASE) 50 MCG/ACT nasal spray Place 2 sprays into the nose as needed.      . furosemide (LASIX) 20 MG tablet Take 20 mg by mouth 2 (two) times daily.      Marland Kitchen glipiZIDE (GLUCOTROL XL) 5 MG 24 hr tablet Take 5 mg by mouth daily.      . isosorbide dinitrate (ISORDIL) 30 MG tablet Take 30 mg by mouth daily.       Marland Kitchen L-Lysine 1000 MG TABS Take by mouth. otc      . Liraglutide 18 MG/3ML SOLN Inject 12 mg into the skin daily.       Marland Kitchen losartan (COZAAR) 100 MG tablet Take 100 mg by mouth daily.      . metFORMIN (GLUCOPHAGE) 1000 MG tablet Take 1,000 mg by mouth 2 (two) times daily with a meal.      . Multiple Vitamin (MULTIVITAMIN) tablet Take 1 tablet by mouth daily.      . NON FORMULARY Cinammon OTC      . NON FORMULARY Garlic Supplement      .  omeprazole (PRILOSEC) 20 MG capsule Take 20 mg by mouth 2 (two) times daily.       . potassium chloride SA (K-DUR,KLOR-CON) 20 MEQ tablet Take 20 mEq by mouth 2 (two) times daily.      Marland Kitchen rOPINIRole (REQUIP) 1 MG tablet Take 1 mg by mouth at bedtime.      . simvastatin (ZOCOR) 40 MG tablet Take 40 mg by mouth every evening.      Marland Kitchen Spacer/Aero-Holding Chambers (AEROCHAMBER MV) inhaler Use as instructed  1 each  0  . DISCONTD: budesonide-formoterol (SYMBICORT) 160-4.5 MCG/ACT inhaler Inhale 2 puffs into the lungs 2 (two) times daily.  1 Inhaler  12  . mometasone-formoterol (DULERA) 100-5 MCG/ACT AERO Inhale 2 puffs into the lungs 2 (two) times daily.  1 Inhaler  5    No Known Allergies  Physical Exam:  Filed Vitals:   10/11/12 1335  BP: 104/52  Pulse: 71  Temp: 97.7 F (36.5 C)  TempSrc: Oral  Height: 5\' 9"  (1.753 m)  Weight: 248 lb (112.492 kg)  SpO2: 94%    Current Encounter SPO2  10/11/12 1335 94%  10/09/12 1347 90%  08/21/12 1345 91%    Body mass index is 36.62 kg/(m^2). Wt Readings from Last 2  Encounters:  10/11/12 248 lb (112.492 kg)  10/09/12 247 lb (112.038 kg)    General - No distress, obese, plethoric appearance  ENT - TM clear, no sinus tenderness, no oral exudate, no LAN, no thyromegaly, MP 3, no thrush Cardiac - s1s2 regular, no murmur, pulses symmetric, minimal ankle edema  Chest - decreased breath sounds, prolonged exhalation, no wheeze/rales/dullness  Back - no focal tenderness  Abd - soft, non-tender, no organomegaly, + bowel sounds  Ext - normal motor strength  Neuro - Cranial nerves are normal. PERLA. EOM's intact.  Skin - no discernible active dermatitis, erythema, urticaria or inflammatory process.  Psych - normal mood, and behavior.  Dg Sniff Test  08/14/2012  *RADIOLOGY REPORT*   Clinical Data: Low O2 sats.  Previous neck surgery.   SNIFF TEST  Comparison: None.  Last time:  0.29 minutes.  Findings: The right hemidiaphragm is elevated and shows paradoxical upward motion during inspiration.   IMPRESSION: Paradoxical motion of the right hemidiaphragm with inspiration.    Original Report Authenticated By: Reyes Ivan, M.D. ( 08/14/2012 12:27:35 )       Assessment/Plan:  [name], MD [facility] Pager:  [phone] 10/11/2012, 1:55 PM

## 2012-10-17 ENCOUNTER — Encounter: Payer: Self-pay | Admitting: Dermatopathology

## 2012-10-18 ENCOUNTER — Ambulatory Visit (INDEPENDENT_AMBULATORY_CARE_PROVIDER_SITE_OTHER): Payer: MEDICARE | Admitting: Cardiovascular Disease

## 2012-10-18 ENCOUNTER — Encounter: Payer: Self-pay | Admitting: Cardiovascular Disease

## 2012-10-18 VITALS — BP 108/64 | HR 72 | Ht 69.0 in | Wt 245.8 lb

## 2012-10-18 DIAGNOSIS — I739 Peripheral vascular disease, unspecified: Secondary | ICD-10-CM

## 2012-10-18 NOTE — Progress Notes (Signed)
HPI  This is a 66 year old pleasant male who was referred by Dr. Gala Romney for evaluation and management of peripheral arterial disease and claudication. The patient recently moved from [place]. He has a history of obesity, HTN, HL, severe osteoarthritis, DM2 (diagnosed 2009), OSA (intolerant of CPAP), former smoker (quit 1989), CAD s/p stent 2001. He also has moderate COPD. He has been dieting recently and lost 45 pounds. Recent ECHO showed EF 55%. RV moderately dilated but normal function. No significant TR jet to measure PA pressure. The patient noticed bilateral calf discomfort with walking which started more than one year ago. The discomfort is worse on the right side. It has been progressive. He gets the pain walking to the mailbox. He usually has to stop for a minute or two and then can resume. This has been significantly affecting his exercise ability. There is no rest pain or lower extremity ulceration.   No Known Allergies   Current Outpatient Prescriptions on File Prior to Visit  Medication Sig Dispense Refill  . aspirin 325 MG tablet Take 325 mg by mouth daily.      . B Complex Vitamins (VITAMIN B COMPLEX PO) Take by mouth. otc      . bisoprolol (ZEBETA) 5 MG tablet Take 1 tablet (5 mg total) by mouth daily.  90 tablet  1  . doxazosin (CARDURA) 4 MG tablet Take 4 mg by mouth at bedtime.      Marland Kitchen doxycycline (ADOXA) 50 MG tablet Take 50 mg by mouth daily.      . fish oil-omega-3 fatty acids 1000 MG capsule Take 2 g by mouth daily. OTC      . fluticasone (FLONASE) 50 MCG/ACT nasal spray Place 2 sprays into the nose as needed.      . furosemide (LASIX) 20 MG tablet Take 20 mg by mouth 2 (two) times daily.      Marland Kitchen glipiZIDE (GLUCOTROL XL) 5 MG 24 hr tablet Take 5 mg by mouth daily.      . isosorbide dinitrate (ISORDIL) 30 MG tablet Take 30 mg by mouth daily.       Marland Kitchen L-Lysine 1000 MG TABS Take by mouth. otc      . Liraglutide 18 MG/3ML SOLN Inject 12 mg into the skin daily.       Marland Kitchen  losartan (COZAAR) 100 MG tablet Take 100 mg by mouth daily.      . metFORMIN (GLUCOPHAGE) 1000 MG tablet Take 1,000 mg by mouth 2 (two) times daily with a meal.      . mometasone-formoterol (DULERA) 100-5 MCG/ACT AERO Inhale 2 puffs into the lungs 2 (two) times daily.  1 Inhaler  5  . Multiple Vitamin (MULTIVITAMIN) tablet Take 1 tablet by mouth daily.      . NON FORMULARY Cinammon OTC      . NON FORMULARY Garlic Supplement      . potassium chloride SA (K-DUR,KLOR-CON) 20 MEQ tablet Take 20 mEq by mouth 2 (two) times daily.      Marland Kitchen rOPINIRole (REQUIP) 1 MG tablet Take 1 mg by mouth at bedtime.      . simvastatin (ZOCOR) 40 MG tablet Take 40 mg by mouth every evening.      Marland Kitchen Spacer/Aero-Holding Chambers (AEROCHAMBER MV) inhaler Use as instructed  1 each  0  . omeprazole (PRILOSEC) 20 MG capsule Take 20 mg by mouth 2 (two) times daily.          Past Medical History  Diagnosis Date  .  CAD (coronary artery disease)     stent placed 06/02/2000  . Lumbar stenosis   . Diabetes mellitus   . Hyperlipidemia   . Congestive heart failure   . COPD (chronic obstructive pulmonary disease) 08/11/2012  . OSA (obstructive sleep apnea) 08/11/2012  . Chronic respiratory failure with hypoxia 08/11/2012     Past Surgical History  Procedure Date  . Tonsillectomy 1965  . Bunionectomy 1991 & 1994    right foot  . Neuroma surgery 1996    right foot  . Lumbar disc surgery Dec 1997 & Ampril 2005  . Cervical fusion Nov 2002  . Hammer toe surgery June 2007 & August 2008    left foot  . Carpal tunnel release 09/30/2006    right wrist  . Hand arthroplasty Oct 2010    right thumb     Family History  Problem Relation Age of Onset  . Heart disease Father 79     History   Social History  . Marital Status: Married    Spouse Name: N/A    Number of Children: N/A  . Years of Education: N/A   Occupational History  . retired    Social History Main Topics  . Smoking status: Former Smoker -- 2.0  packs/day for 28 years    Types: Cigarettes    Quit date: 08/03/1987  . Smokeless tobacco: Not on file  . Alcohol Use: No  . Drug Use: No  . Sexually Active: Not on file   Other Topics Concern  . Not on file   Social History Narrative  . No narrative on file     PHYSICAL EXAM   BP 108/64  Pulse 72  Ht 5\' 9"  (1.753 m)  Wt 111.471 kg (245 lb 12 oz)  BMI 36.29 kg/m2  SpO2 94% Constitutional: He is oriented to person, place, and time. He appears well-developed and well-nourished. No distress.  HENT: No nasal discharge.  Head: Normocephalic and atraumatic.  Eyes: Pupils are equal and round. Right eye exhibits no discharge. Left eye exhibits no discharge.  Neck: Normal range of motion. Neck supple. No JVD present. No thyromegaly present.  Cardiovascular: Normal rate, regular rhythm, normal heart sounds and. Exam reveals no gallop and no friction rub. No murmur heard.  Pulmonary/Chest: Effort normal and breath sounds normal. No stridor. No respiratory distress. He has no wheezes. He has no rales. He exhibits no tenderness.  Abdominal: Soft. Bowel sounds are normal. He exhibits no distension. There is no tenderness. There is no rebound and no guarding.  Musculoskeletal: Normal range of motion. He exhibits no edema and no tenderness.  Neurological: He is alert and oriented to person, place, and time. Coordination normal.  Skin: Skin is warm and dry. No rash noted. He is not diaphoretic. No erythema. No pallor.  Psychiatric: He has a normal mood and affect. His behavior is normal. Judgment and thought content normal.  Vascular: Femoral pulse is diminished bilaterally but that is stronger on the right side. DP/PT diminished bilaterally. Radial pulses are normal on both sides.     ASSESSMENT AND PLAN

## 2012-10-18 NOTE — Patient Instructions (Addendum)
You have been referred to Dr Myra Gianotti at [facility]

## 2012-10-18 NOTE — Assessment & Plan Note (Signed)
The patient has bilateral calf claudication worse on the right side. His symptoms currently seem to be lifestyle limiting and has been progressive over the last year. He is currently in [place] category 2 or 3.  Duplex ultrasound showed significant right common femoral artery stenosis with a velocity of 489 cm/s. There was also evidence of right popliteal stenosis. On the left side, there was evidence of inflow disease. I discussed with the patient and his wife different management options including a trial of medical therapy with a walking program versus proceeding with angiography and possible revascularization. The patient is frustrated by his inability to do much exercise. His symptoms also has been worsening. Thus, I think it is reasonable to proceed with the second option. The patient's wife is interested in a second opinion and he will be referred to Dr. Myra Gianotti per their request.

## 2012-11-02 ENCOUNTER — Telehealth: Payer: Self-pay | Admitting: Pulmonary Disease

## 2012-11-02 NOTE — Telephone Encounter (Signed)
CPAP 09/26/12 to 10/25/12>>Used on 30 of 30 nights with average 8 hrs 48 min.  Average AHI 0.6 with CPAP 13 cm H2O.  Will have my nurse inform pt that CPAP report looks very good.  No change to current set up.

## 2012-11-03 NOTE — Telephone Encounter (Signed)
I spoke with patient about results and he verbalized understanding and had no questions 

## 2012-11-06 ENCOUNTER — Encounter: Payer: MEDICARE | Admitting: Surgery

## 2012-11-10 ENCOUNTER — Encounter: Payer: Self-pay | Admitting: Surgery

## 2012-11-13 ENCOUNTER — Ambulatory Visit (INDEPENDENT_AMBULATORY_CARE_PROVIDER_SITE_OTHER): Payer: MEDICARE | Admitting: Surgery

## 2012-11-13 ENCOUNTER — Encounter: Payer: Self-pay | Admitting: Surgery

## 2012-11-13 VITALS — BP 123/68 | HR 68 | Resp 16 | Ht 68.0 in | Wt 243.4 lb

## 2012-11-13 DIAGNOSIS — I739 Peripheral vascular disease, unspecified: Secondary | ICD-10-CM

## 2012-11-13 DIAGNOSIS — I70219 Atherosclerosis of native arteries of extremities with intermittent claudication, unspecified extremity: Secondary | ICD-10-CM | POA: Insufficient documentation

## 2012-11-13 NOTE — Progress Notes (Signed)
[facility] [employer]   Patient name: Anthony [name] MRN: [id number] DOB: 01/16/1946 Sex: male   Referred by: Dr. Arida  Reason for referral:  Chief Complaint  Patient presents with  . New Evaluation    PAD severe focal right CFA stenosis, Ref. by Dr. Arida    HISTORY OF PRESENT ILLNESS: The patient is referred today for a second opinion regarding peripheral vascular disease. This is a very pleasant 66-year-old gentleman who for the past several years has been experiencing progressive lower extremity problems which he describes as cramping or tightness in both calves. This fracture and at less than one block. It is alleviated with rest. This has become lifestyle limiting for him. He denies ulceration. He denies rest pain.  The patient is a diabetic. His most recent hemoglobin A1c was 5.6. He is also medically managed for his hypercholesterolemia with a statin. His last LDL cholesterol was 70. He has a history of coronary artery disease which was treated with percutaneous stenting in the remote past. He has not had any congestive failure symptoms or angina recently. He is on 1 L of oxygen at night. This is thought to be secondary to the phrenic nerve injury during neck surgery. This was confirmed with a sniff test.  Past Medical History  Diagnosis Date  . CAD (coronary artery disease)     stent placed 06/02/2000  . Lumbar stenosis   . Diabetes mellitus   . Hyperlipidemia   . Congestive heart failure   . COPD (chronic obstructive pulmonary disease) 08/11/2012  . OSA (obstructive sleep apnea) 08/11/2012  . Chronic respiratory failure with hypoxia 08/11/2012  . Peripheral vascular disease     Past Surgical History  Procedure Date  . Tonsillectomy 1965  . Bunionectomy 1991 & 1994    right foot  . Neuroma surgery 1996    right foot  . Lumbar disc surgery Dec 1997 & Ampril 2005  . Cervical fusion Nov 2002  . Hammer toe surgery June 2007 & August 2008    left foot  .  Carpal tunnel release 09/30/2006    right wrist  . Hand arthroplasty Oct 2010    right thumb  . Heart catherization 03/26/1999  . Angioplasty 06/02/2000    Stent  . Spine surgery 04/21/2004    Lower back disk gurgery- Lumbar stenosis    History   Social History  . Marital Status: Married    Spouse Name: N/A    Number of Children: N/A  . Years of Education: N/A   Occupational History  . retired    Social History Main Topics  . Smoking status: Former Smoker -- 2.0 packs/day for 28 years    Types: Cigarettes    Quit date: 08/03/1987  . Smokeless tobacco: Never Used  . Alcohol Use: No  . Drug Use: No  . Sexually Active: Not on file   Other Topics Concern  . Not on file   Social History Narrative  . No narrative on file    Family History  Problem Relation Age of Onset  . Heart disease Father 48  . Cancer Father   . Hyperlipidemia Father   . Hypertension Father   . Heart attack Father   . Cancer Mother   . Deep vein thrombosis Mother     Varicose veins  . Diabetes Mother     Allergies as of 11/13/2012  . (No Known Allergies)    Current Outpatient Prescriptions on File Prior to Visit  Medication   Sig Dispense Refill  . aspirin 325 MG tablet Take 325 mg by mouth daily.      . B Complex Vitamins (VITAMIN B COMPLEX PO) Take by mouth. otc      . bisoprolol (ZEBETA) 5 MG tablet Take 1 tablet (5 mg total) by mouth daily.  90 tablet  1  . doxazosin (CARDURA) 4 MG tablet Take 4 mg by mouth at bedtime.      . doxycycline (ADOXA) 50 MG tablet Take 50 mg by mouth daily.      . fish oil-omega-3 fatty acids 1000 MG capsule Take 2 g by mouth daily. OTC      . furosemide (LASIX) 20 MG tablet Take 20 mg by mouth daily.       . glipiZIDE (GLUCOTROL XL) 5 MG 24 hr tablet Take 5 mg by mouth daily.      . isosorbide dinitrate (ISORDIL) 30 MG tablet Take 30 mg by mouth daily.       . L-Lysine 1000 MG TABS Take by mouth. otc      . Liraglutide 18 MG/3ML SOLN Inject 6 mg into the skin  daily.       . losartan (COZAAR) 100 MG tablet Take 100 mg by mouth daily.      . metFORMIN (GLUCOPHAGE) 1000 MG tablet Take 1,000 mg by mouth 2 (two) times daily with a meal.      . mometasone-formoterol (DULERA) 100-5 MCG/ACT AERO Inhale 2 puffs into the lungs 2 (two) times daily.  1 Inhaler  5  . Multiple Vitamin (MULTIVITAMIN) tablet Take 1 tablet by mouth daily.      . NON FORMULARY Cinammon OTC      . NON FORMULARY Garlic Supplement      . potassium chloride SA (K-DUR,KLOR-CON) 20 MEQ tablet Take 20 mEq by mouth 2 (two) times daily.      . rOPINIRole (REQUIP) 1 MG tablet Take 1 mg by mouth at bedtime.      . simvastatin (ZOCOR) 40 MG tablet Take 40 mg by mouth every evening.      . Spacer/Aero-Holding Chambers (AEROCHAMBER MV) inhaler Use as instructed  1 each  0  . fluticasone (FLONASE) 50 MCG/ACT nasal spray Place 2 sprays into the nose as needed.      . omeprazole (PRILOSEC) 20 MG capsule Take 20 mg by mouth 2 (two) times daily.          REVIEW OF SYSTEMS: Cardiovascular: No chest pain, chest pressure, No history of DVT or phlebitis. Pulmonary: No productive cough, asthma or wheezing. Neurologic: No weakness, paresthesias, aphasia, or amaurosis. No dizziness. Hematologic: No bleeding problems or clotting disorders. Musculoskeletal: No joint pain or joint swelling. Gastrointestinal: No blood in stool or hematemesis Genitourinary: No dysuria or hematuria. Psychiatric:: No history of major depression. Integumentary: No rashes or ulcers. Constitutional: No fever or chills.  PHYSICAL EXAMINATION: General: The patient appears their stated age.  Vital signs are BP 123/68  Pulse 68  Resp 16  Ht 5' 8" (1.727 m)  Wt 243 lb 6.4 oz (110.406 kg)  BMI 37.01 kg/m2  SpO2 97% HEENT:  No gross abnormalities Pulmonary: Respirations are non-labored Abdomen: Soft and non-tender. Aorta is not palpable  Musculoskeletal: There are no major deformities.   Neurologic: No focal weakness or  paresthesias are detected, Skin: There are no ulcer or rashes noted. Psychiatric: The patient has normal affect. Cardiovascular: There is a regular rate and rhythm without significant murmur appreciated. Femoral pulses are palpable. The   left is 2+, right is 1+. No carotid bruits  Diagnostic Studies: I have reviewed his outside ultrasound which shows a high-grade right common femoral stenosis and a right popliteal stenosis. The ABI on the right is 0.64 on the left is 0.83  Outside Studies/Documentation Historical records were reviewed.  They showed vascular disease with bilateral arterial insufficiency  Medication Changes: The patient was started on cilostazol today  Assessment:  Peripheral vascular disease Plan: I discussed the treatment options today which include medical therapy, versus surgical therapy, versus percutaneous intervention. The patient's symptoms do appear to be lifestyle limiting, and therefore I would recommend proceeding with intervention. I have scheduled him for angiography to be performed on Wednesday, December 4. L. plan on accessing his left groin, studying his aorta and bilateral lower extremities, and proceeding with intervention of the right leg if feasible. The risks and benefits were discussed today. I decided to start him on cilostazol to see if we get any relief from this. Congestive heart failure is a contraindication, however the patient has not had any failure symptoms for greater than 10 years. If he does have significant improvement from medication, he'll contact me and cancel his arteriogram and have followup in 6 months.     V. Wells Brabham IV, M.D. [facility]  Office: [phone] Pager:  [phone]   

## 2012-11-20 ENCOUNTER — Encounter (HOSPITAL_COMMUNITY): Payer: Self-pay | Admitting: Pharmacy Technician

## 2012-11-21 ENCOUNTER — Other Ambulatory Visit: Payer: Self-pay

## 2012-11-28 MED ORDER — SODIUM CHLORIDE 0.9 % IV SOLN: INTRAVENOUS | Status: DC

## 2012-11-29 ENCOUNTER — Encounter (HOSPITAL_COMMUNITY)
Admission: RE | Disposition: A | Discharge status: Home / Self Care | Payer: Self-pay | Source: Ambulatory Visit | Attending: Surgery

## 2012-11-29 ENCOUNTER — Telehealth: Payer: Self-pay | Admitting: Surgery

## 2012-11-29 ENCOUNTER — Ambulatory Visit (HOSPITAL_COMMUNITY)
Admission: RE | Admit: 2012-11-29 | Discharge: 2012-11-29 | Disposition: A | Discharge status: Home / Self Care | Payer: MEDICARE | Source: Ambulatory Visit | Attending: Surgery | Admitting: Surgery

## 2012-11-29 DIAGNOSIS — J449 Chronic obstructive pulmonary disease, unspecified: Secondary | ICD-10-CM | POA: Insufficient documentation

## 2012-11-29 DIAGNOSIS — E119 Type 2 diabetes mellitus without complications: Secondary | ICD-10-CM | POA: Insufficient documentation

## 2012-11-29 DIAGNOSIS — I70219 Atherosclerosis of native arteries of extremities with intermittent claudication, unspecified extremity: Secondary | ICD-10-CM

## 2012-11-29 DIAGNOSIS — J4489 Other specified chronic obstructive pulmonary disease: Secondary | ICD-10-CM | POA: Insufficient documentation

## 2012-11-29 HISTORY — PX: ABDOMINAL AORTAGRAM: SHX5454

## 2012-11-29 HISTORY — PX: ABDOMINAL ANGIOGRAM: SHX5705

## 2012-11-29 LAB — POCT I-STAT, CHEM 8
BUN: 39 mg/dL — ABNORMAL HIGH (ref 6–23)
Calcium, Ion: 1.22 mmol/L (ref 1.13–1.30)
Chloride: 108 mEq/L (ref 96–112)
Creatinine, Ser: 1.3 mg/dL (ref 0.50–1.35)
Glucose, Bld: 154 mg/dL — ABNORMAL HIGH (ref 70–99)
HCT: 43 % (ref 39.0–52.0)
Hemoglobin: 14.6 g/dL (ref 13.0–17.0)
Potassium: 4.5 mEq/L (ref 3.5–5.1)
Sodium: 139 mEq/L (ref 135–145)
TCO2: 23 mmol/L (ref 0–100)

## 2012-11-29 LAB — GLUCOSE, CAPILLARY: Glucose-Capillary: 119 mg/dL — ABNORMAL HIGH (ref 70–99)

## 2012-11-29 SURGERY — ABDOMINAL AORTAGRAM
Anesthesia: LOCAL

## 2012-11-29 MED ORDER — HYDRALAZINE HCL 20 MG/ML IJ SOLN: 10.0000 mg | INTRAMUSCULAR | Status: DC | PRN

## 2012-11-29 MED ORDER — ALUM & MAG HYDROXIDE-SIMETH 200-200-20 MG/5ML PO SUSP: 15.0000 mL | ORAL | Status: DC | PRN

## 2012-11-29 MED ORDER — OXYCODONE-ACETAMINOPHEN 5-325 MG PO TABS: 1.0000 | ORAL_TABLET | ORAL | Status: DC | PRN

## 2012-11-29 MED ORDER — MIDAZOLAM HCL 2 MG/2ML IJ SOLN
INTRAMUSCULAR | Status: AC
  Filled 2012-11-29: qty 2

## 2012-11-29 MED ORDER — ACETAMINOPHEN 325 MG PO TABS: 325.0000 mg | ORAL_TABLET | ORAL | Status: DC | PRN

## 2012-11-29 MED ORDER — HEPARIN (PORCINE) IN NACL 2-0.9 UNIT/ML-% IJ SOLN
INTRAMUSCULAR | Status: AC
  Filled 2012-11-29: qty 1000

## 2012-11-29 MED ORDER — FENTANYL CITRATE 0.05 MG/ML IJ SOLN
INTRAMUSCULAR | Status: AC
  Filled 2012-11-29: qty 2

## 2012-11-29 MED ORDER — MORPHINE SULFATE 10 MG/ML IJ SOLN: 2.0000 mg | INTRAMUSCULAR | Status: DC | PRN

## 2012-11-29 MED ORDER — ACETAMINOPHEN 325 MG RE SUPP: 325.0000 mg | RECTAL | Status: DC | PRN

## 2012-11-29 MED ORDER — PHENOL 1.4 % MT LIQD: 1.0000 | OROMUCOSAL | Status: DC | PRN

## 2012-11-29 MED ORDER — GUAIFENESIN-DM 100-10 MG/5ML PO SYRP: 15.0000 mL | ORAL_SOLUTION | ORAL | Status: DC | PRN

## 2012-11-29 MED ORDER — LABETALOL HCL 5 MG/ML IV SOLN: 10.0000 mg | INTRAVENOUS | Status: DC | PRN

## 2012-11-29 MED ORDER — SODIUM CHLORIDE 0.9 % IV SOLN: INTRAVENOUS | Status: DC

## 2012-11-29 MED ORDER — ONDANSETRON HCL 4 MG/2ML IJ SOLN: 4.0000 mg | Freq: Four times a day (QID) | INTRAMUSCULAR | Status: DC | PRN

## 2012-11-29 MED ORDER — LIDOCAINE HCL (PF) 1 % IJ SOLN
INTRAMUSCULAR | Status: AC
  Filled 2012-11-29: qty 30

## 2012-11-29 MED ORDER — METOPROLOL TARTRATE 1 MG/ML IV SOLN: 2.0000 mg | INTRAVENOUS | Status: DC | PRN

## 2012-11-29 NOTE — H&P (View-Only) (Signed)
[facility] Specialist of [facility]   Patient name: Anthony [name] MRN: [id number] DOB: [birth year] Sex: male   Referred by: Dr. Kirke Corin  Reason for referral:  Chief Complaint  Patient presents with  . New Evaluation    PAD severe focal right CFA stenosis, Ref. by Dr. Kirke Corin    HISTORY OF PRESENT ILLNESS: The patient is referred today for a second opinion regarding peripheral vascular disease. This is a very pleasant 66 year old gentleman who for the past several years has been experiencing progressive lower extremity problems which he describes as cramping or tightness in both calves. This fracture and at less than one block. It is alleviated with rest. This has become lifestyle limiting for him. He denies ulceration. He denies rest pain.  The patient is a diabetic. His most recent hemoglobin A1c was 5.6. He is also medically managed for his hypercholesterolemia with a statin. His last LDL cholesterol was 70. He has a history of coronary artery disease which was treated with percutaneous stenting in the remote past. He has not had any congestive failure symptoms or angina recently. He is on 1 L of oxygen at night. This is thought to be secondary to the phrenic nerve injury during neck surgery. This was confirmed with a sniff test.  Past Medical History  Diagnosis Date  . CAD (coronary artery disease)     stent placed 06/02/2000  . Lumbar stenosis   . Diabetes mellitus   . Hyperlipidemia   . Congestive heart failure   . COPD (chronic obstructive pulmonary disease) 08/11/2012  . OSA (obstructive sleep apnea) 08/11/2012  . Chronic respiratory failure with hypoxia 08/11/2012  . Peripheral vascular disease     Past Surgical History  Procedure Date  . Tonsillectomy 1965  . Bunionectomy 1991 & 1994    right foot  . Neuroma surgery 1996    right foot  . Lumbar disc surgery Dec 1997 & Ampril 2005  . Cervical fusion Nov 2002  . Hammer toe surgery June 2007 & August 2008    left foot  .  Carpal tunnel release 09/30/2006    right wrist  . Hand arthroplasty Oct 2010    right thumb  . Heart catherization 03/26/1999  . Angioplasty 06/02/2000    Stent  . Spine surgery 04/21/2004    Lower back disk gurgery- Lumbar stenosis    History   Social History  . Marital Status: Married    Spouse Name: N/A    Number of Children: N/A  . Years of Education: N/A   Occupational History  . retired    Social History Main Topics  . Smoking status: Former Smoker -- 2.0 packs/day for 28 years    Types: Cigarettes    Quit date: 08/03/1987  . Smokeless tobacco: Never Used  . Alcohol Use: No  . Drug Use: No  . Sexually Active: Not on file   Other Topics Concern  . Not on file   Social History Narrative  . No narrative on file    Family History  Problem Relation Age of Onset  . Heart disease Father 15  . Cancer Father   . Hyperlipidemia Father   . Hypertension Father   . Heart attack Father   . Cancer Mother   . Deep vein thrombosis Mother     Varicose veins  . Diabetes Mother     Allergies as of 11/13/2012  . (No Known Allergies)    Current Outpatient Prescriptions on File Prior to Visit  Medication  Sig Dispense Refill  . aspirin 325 MG tablet Take 325 mg by mouth daily.      . B Complex Vitamins (VITAMIN B COMPLEX PO) Take by mouth. otc      . bisoprolol (ZEBETA) 5 MG tablet Take 1 tablet (5 mg total) by mouth daily.  90 tablet  1  . doxazosin (CARDURA) 4 MG tablet Take 4 mg by mouth at bedtime.      Marland Kitchen doxycycline (ADOXA) 50 MG tablet Take 50 mg by mouth daily.      . fish oil-omega-3 fatty acids 1000 MG capsule Take 2 g by mouth daily. OTC      . furosemide (LASIX) 20 MG tablet Take 20 mg by mouth daily.       Marland Kitchen glipiZIDE (GLUCOTROL XL) 5 MG 24 hr tablet Take 5 mg by mouth daily.      . isosorbide dinitrate (ISORDIL) 30 MG tablet Take 30 mg by mouth daily.       Marland Kitchen L-Lysine 1000 MG TABS Take by mouth. otc      . Liraglutide 18 MG/3ML SOLN Inject 6 mg into the skin  daily.       Marland Kitchen losartan (COZAAR) 100 MG tablet Take 100 mg by mouth daily.      . metFORMIN (GLUCOPHAGE) 1000 MG tablet Take 1,000 mg by mouth 2 (two) times daily with a meal.      . mometasone-formoterol (DULERA) 100-5 MCG/ACT AERO Inhale 2 puffs into the lungs 2 (two) times daily.  1 Inhaler  5  . Multiple Vitamin (MULTIVITAMIN) tablet Take 1 tablet by mouth daily.      . NON FORMULARY Cinammon OTC      . NON FORMULARY Garlic Supplement      . potassium chloride SA (K-DUR,KLOR-CON) 20 MEQ tablet Take 20 mEq by mouth 2 (two) times daily.      Marland Kitchen rOPINIRole (REQUIP) 1 MG tablet Take 1 mg by mouth at bedtime.      . simvastatin (ZOCOR) 40 MG tablet Take 40 mg by mouth every evening.      Marland Kitchen Spacer/Aero-Holding Chambers (AEROCHAMBER MV) inhaler Use as instructed  1 each  0  . fluticasone (FLONASE) 50 MCG/ACT nasal spray Place 2 sprays into the nose as needed.      Marland Kitchen omeprazole (PRILOSEC) 20 MG capsule Take 20 mg by mouth 2 (two) times daily.          REVIEW OF SYSTEMS: Cardiovascular: No chest pain, chest pressure, No history of DVT or phlebitis. Pulmonary: No productive cough, asthma or wheezing. Neurologic: No weakness, paresthesias, aphasia, or amaurosis. No dizziness. Hematologic: No bleeding problems or clotting disorders. Musculoskeletal: No joint pain or joint swelling. Gastrointestinal: No blood in stool or hematemesis Genitourinary: No dysuria or hematuria. Psychiatric:: No history of major depression. Integumentary: No rashes or ulcers. Constitutional: No fever or chills.  PHYSICAL EXAMINATION: General: The patient appears their stated age.  Vital signs are BP 123/68  Pulse 68  Resp 16  Ht 5\' 8"  (1.727 m)  Wt 243 lb 6.4 oz (110.406 kg)  BMI 37.01 kg/m2  SpO2 97% HEENT:  No gross abnormalities Pulmonary: Respirations are non-labored Abdomen: Soft and non-tender. Aorta is not palpable  Musculoskeletal: There are no major deformities.   Neurologic: No focal weakness or  paresthesias are detected, Skin: There are no ulcer or rashes noted. Psychiatric: The patient has normal affect. Cardiovascular: There is a regular rate and rhythm without significant murmur appreciated. Femoral pulses are palpable. The  left is 2+, right is 1+. No carotid bruits  Diagnostic Studies: I have reviewed his outside ultrasound which shows a high-grade right common femoral stenosis and a right popliteal stenosis. The ABI on the right is 0.64 on the left is 0.83  Outside Studies/Documentation Historical records were reviewed.  They showed vascular disease with bilateral arterial insufficiency  Medication Changes: The patient was started on cilostazol today  Assessment:  Peripheral vascular disease Plan: I discussed the treatment options today which include medical therapy, versus surgical therapy, versus percutaneous intervention. The patient's symptoms do appear to be lifestyle limiting, and therefore I would recommend proceeding with intervention. I have scheduled him for angiography to be performed on Wednesday, December 4. L. plan on accessing his left groin, studying his aorta and bilateral lower extremities, and proceeding with intervention of the right leg if feasible. The risks and benefits were discussed today. I decided to start him on cilostazol to see if we get any relief from this. Congestive heart failure is a contraindication, however the patient has not had any failure symptoms for greater than 10 years. If he does have significant improvement from medication, he'll contact me and cancel his arteriogram and have followup in 6 months.     Jorge Ny, M.D. [facility] [place] Office: [phone] Pager:  [phone]

## 2012-11-29 NOTE — Telephone Encounter (Signed)
Pt called, dpm

## 2012-11-29 NOTE — Interval H&P Note (Signed)
History and Physical Interval Note:  11/29/2012 10:37 AM  Anthony [name]  has presented today for surgery, with the diagnosis of PVD  The various methods of treatment have been discussed with the patient and family. After consideration of risks, benefits and other options for treatment, the patient has consented to  Procedure(s) (LRB) with comments: ABDOMINAL AORTAGRAM (N/A) as a surgical intervention .  The patient's history has been reviewed, patient examined, no change in status, stable for surgery.  I have reviewed the patient's chart and labs.  Questions were answered to the patient's satisfaction.     Jamerson Vonbargen IV, V. WELLS

## 2012-11-29 NOTE — Telephone Encounter (Signed)
Message copied by Fredrich Birks on Wed Nov 29, 2012  3:24 PM ------      Message from: Melene Plan      Created: Wed Nov 29, 2012 12:10 PM                   ----- Message -----         From: Nada Libman, MD         Sent: 11/29/2012  11:31 AM           To: Reuel Derby, Melene Plan, RN            11/29/2012:                   1.  ultrasound-guided access, left femoral artery       2.  abdominal aortogram       3.  bilateral lower extremity runoff       4.  second order catheterization (right external iliac artery).                   Please schedule the patient coming to see me in one month

## 2012-11-29 NOTE — Op Note (Signed)
[facility] [place]  Patient name: Anthony [name] MRN: [id number] DOB: December 27, 1946 Sex: male  11/29/2012 Pre-operative Diagnosis: Bilateral claudication, right greater than left Post-operative diagnosis:  Same Surgeon:  Jorge Ny Procedure Performed:  1.  ultrasound-guided access, left femoral artery  2.  abdominal aortogram  3.  bilateral lower extremity runoff  4.  second order catheterization (right external iliac artery).     Indications:  The patient suffers from bilateral lower extremity lifestyle limiting claudication, right leg worse than the left. Ultrasound has identified lesions within the right common femoral and right popliteal artery no be further evaluated today. In addition we will evaluate his left leg. Percutaneous intervention will be considered if it is reasonable.  Procedure:  The patient was identified in the holding area and taken to room 8.  The patient was then placed supine on the table and prepped and draped in the usual sterile fashion.  A time out was called.  Ultrasound was used to evaluate the left common femoral artery.  It was patent .  A digital ultrasound image was acquired.  A micropuncture needle was used to access the left common femoral artery under ultrasound guidance.  An 018 wire was advanced without resistance and a micropuncture sheath was placed.  The 018 wire was removed and a benson wire was placed.  The micropuncture sheath was exchanged for a 5 french sheath.  An omniflush catheter was advanced over the wire to the level of L-1.  An abdominal angiogram was obtained.  Next, using the omniflush catheter and a benson wire, the aortic bifurcation was crossed and the catheter was placed into theright external iliac artery and right runoff was obtained.  left runoff was performed via retrograde sheath injections.  Findings:   Aortogram:  The visualized portions of the suprarenal abdominal aorta showed no significant  stenosis. Single renal arteries are identified bilaterally which are widely patent without significant stenosis. The infrarenal abdominal aorta is widely patent. The right common and external iliac arteries are patent throughout their course without significant stenosis. Significant calcification is identified within bilateral common and external iliac arteries. The right hypogastric artery is occluded. The left common iliac artery is patent with significant calcification at its distal extent, creating moderate stenosis.. The left hypogastric artery is widely patent the left external iliac artery is patent. A pullback gradient was obtained across the common and external iliac stenosis. There was a 20 mm gradient across the common left iliac lesion.  Right Lower Extremity:  Several islands of calcification are identified within the right common femoral artery which appeared to be flow-limiting. The right profundal femoral artery is widely patent. The right superficial femoral artery has several islands of focal calcification creating significant stenosis. There is a nearly occlusive lesion within the popliteal artery within the joint space behind the knee. Three-vessel runoff is identified down to the ankle. Across the ankle, the posterior tibial artery is the dominant vessel. The anterior tibial artery becomes diminutive.  Left Lower Extremity:  Several islands of focal calcification are identified within the left common femoral artery. There are also calcified areas at the ostium of the left profunda femoral artery. Multiple focal calcifications are seen within the superficial femoral artery creating mild to moderate stenosis. Again seen in the popliteal artery behind the knee is a large calcified lesion. This does not appear to be flow-limiting at this time. There is three-vessel runoff down to the ankle. The posterior tibial artery is the  dominant vessel across the ankle.  Intervention:   None  Impression:  #1  20 mm gradient across a left common iliac lesion  #2  focal calcified lesions within the right common femoral artery which are hemodynamically significant.  #3  focal lesion in the right popliteal artery behind the knee which is hemodynamically significant  #4  left common femoral artery stenosis secondary to focal areas of calcified plaque    V. Durene Cal, M.D. [facility] [place] Office: [phone] Pager:  [phone]

## 2012-11-29 NOTE — Progress Notes (Addendum)
1530 discharge instructions given to patient and spouse.  Both voice understanding.  Advised to resume glucophage  In 48 hours.   1545 ambulatory about department without incident.  Groin without sign of bleeding or hematoma

## 2012-11-29 NOTE — Telephone Encounter (Addendum)
Message copied by Shari Prows on Wed Nov 29, 2012  3:25 PM ------      Message from: Melene Plan      Created: Wed Nov 29, 2012 12:10 PM                   ----- Message -----         From: Nada Libman, MD         Sent: 11/29/2012  11:31 AM           To: Reuel Derby, Melene Plan, RN            11/29/2012:                   1.  ultrasound-guided access, left femoral artery       2.  abdominal aortogram       3.  bilateral lower extremity runoff       4.  second order catheterization (right external iliac artery).                   Please schedule the patient coming to see me in one month  The above pt was scheduled on 01/08/13 at 11:45am w/ [facility]. I left a message for the pt and also mailed an appt letter.awt

## 2012-12-27 HISTORY — PX: EYE SURGERY: SHX253

## 2012-12-27 HISTORY — PX: CATARACT EXTRACTION W/ INTRAOCULAR LENS IMPLANT: SHX1309

## 2013-01-05 ENCOUNTER — Encounter: Payer: Self-pay | Admitting: Surgery

## 2013-01-08 ENCOUNTER — Encounter: Payer: Self-pay | Admitting: Pulmonary Disease

## 2013-01-08 ENCOUNTER — Ambulatory Visit (INDEPENDENT_AMBULATORY_CARE_PROVIDER_SITE_OTHER): Payer: MEDICARE | Admitting: Surgery

## 2013-01-08 ENCOUNTER — Encounter: Payer: Self-pay | Admitting: Surgery

## 2013-01-08 ENCOUNTER — Ambulatory Visit (INDEPENDENT_AMBULATORY_CARE_PROVIDER_SITE_OTHER): Payer: MEDICARE | Admitting: Pulmonary Disease

## 2013-01-08 VITALS — BP 132/77 | HR 58 | Resp 16 | Ht 68.0 in | Wt 234.0 lb

## 2013-01-08 VITALS — BP 120/64 | HR 49 | Temp 98.0°F | Ht 68.0 in | Wt 235.6 lb

## 2013-01-08 DIAGNOSIS — J449 Chronic obstructive pulmonary disease, unspecified: Secondary | ICD-10-CM

## 2013-01-08 DIAGNOSIS — I739 Peripheral vascular disease, unspecified: Secondary | ICD-10-CM

## 2013-01-08 DIAGNOSIS — G4733 Obstructive sleep apnea (adult) (pediatric): Secondary | ICD-10-CM

## 2013-01-08 DIAGNOSIS — R490 Dysphonia: Secondary | ICD-10-CM

## 2013-01-08 MED ORDER — FLUTICASONE PROPIONATE 50 MCG/ACT NA SUSP: 2.0000 | Freq: Every day | NASAL | Status: DC

## 2013-01-08 NOTE — Assessment & Plan Note (Signed)
Stable on current inhaler regimen. 

## 2013-01-08 NOTE — Assessment & Plan Note (Signed)
This is intermittent, and seems to be related to post nasal drip.  Will give him trial of flonase.

## 2013-01-08 NOTE — Patient Instructions (Signed)
Flonase two sprays each nostril daily for 3 weeks, then as needed Call in 2 to 3 weeks to update status of hoarseness Will arrange for overnight oxygen test Follow up in 6 months

## 2013-01-08 NOTE — Progress Notes (Signed)
Chief Complaint  Patient presents with  . COPD    breathing is better. c/o raspy voice from dulera (using spacer and rinsing after each use). no cough, wheezing, chest tx)  . Sleep Apnea    wears CPAP everynight x 8-9 hrs. denies any problems w/ mask/machine    History of Present Illness: Anthony [name] is a 67 y.o. male former smoker with dyspnea 2nd to COPD, right hemidiaphragm paralysis, chronic hypoxia, and OSA.  He has been doing well.  He is sleeping better, and more active.  His oxygen levels have improved, and he is no longer needing oxygen during the day.  He still has intermittent hoarseness.  He thinks this was worse when he was taking PPI.  He has some post-nasal drip.  He is sleeping better with CPAP.  Tests: PFT 08/13/12>>FEV1 1.83 (62%), FEV1% 64, TLC 5.17 (82%), DLCO 67%, +BD SNIFF test 08/14/12>>Paradoxical motion of the right hemidiaphragm with inspiration.  PSG 08/14/12>>AHI 38.5.  CPAP 13 cm H2O with 1 liter oxygen. Echo 10/09/12>>mild LVH, EF 50 to 55%, grade 1 diastolic dysfx, mild LA dilation, PAS 35 mmHg CPAP 09/26/12 to 10/25/12>>Used on 30 of 30 nights with average 8 hrs 48 min. Average AHI 0.6 with CPAP 13 cm H2O.  Past Medical History  Diagnosis Date  . CAD (coronary artery disease)     stent placed 06/02/2000  . Lumbar stenosis   . Diabetes mellitus   . Hyperlipidemia   . Congestive heart failure   . COPD (chronic obstructive pulmonary disease) 08/11/2012  . OSA (obstructive sleep apnea) 08/11/2012  . Chronic respiratory failure with hypoxia 08/11/2012  . Peripheral vascular disease     Past Surgical History  Procedure Date  . Tonsillectomy 1965  . Bunionectomy 1991 & 1994    right foot  . Neuroma surgery 1996    right foot  . Lumbar disc surgery Dec 1997 & Ampril 2005  . Cervical fusion Nov 2002  . Hammer toe surgery June 2007 & August 2008    left foot  . Carpal tunnel release 09/30/2006    right wrist  . Hand arthroplasty Oct 2010   right thumb  . Heart catherization 03/26/1999  . Angioplasty 06/02/2000    Stent  . Spine surgery 04/21/2004    Lower back disk gurgery- Lumbar stenosis  . Abdominal angiogram Dec. 4, 2013    Outpatient Encounter Prescriptions as of 01/08/2013  Medication Sig Dispense Refill  . aspirin 325 MG tablet Take 325 mg by mouth daily.      . B Complex Vitamins (VITAMIN B COMPLEX PO) Take 1 tablet by mouth 2 (two) times daily.       . bisoprolol (ZEBETA) 5 MG tablet Take 5 mg by mouth daily.      Marland Kitchen CINNAMON PO Take 2 tablets by mouth daily.      Marland Kitchen doxazosin (CARDURA) 4 MG tablet Take 2 mg by mouth at bedtime.       Marland Kitchen doxycycline (ADOXA) 50 MG tablet Take 50 mg by mouth daily.      . fish oil-omega-3 fatty acids 1000 MG capsule Take 2 g by mouth daily.       . furosemide (LASIX) 20 MG tablet Take 20 mg by mouth daily.       Marland Kitchen GARLIC PO Take 1 tablet by mouth daily.      Marland Kitchen glipiZIDE (GLUCOTROL XL) 5 MG 24 hr tablet Take 5 mg by mouth daily.      . isosorbide  dinitrate (ISORDIL) 30 MG tablet Take 30 mg by mouth daily.       Marland Kitchen L-Lysine 1000 MG TABS Take 1,000 mg by mouth daily.       . Liraglutide 18 MG/3ML SOLN Inject 6 mg into the skin daily.       Marland Kitchen losartan (COZAAR) 100 MG tablet Take 100 mg by mouth daily.      . metFORMIN (GLUCOPHAGE) 1000 MG tablet Take 1,000 mg by mouth 2 (two) times daily with a meal.      . mometasone-formoterol (DULERA) 100-5 MCG/ACT AERO Inhale 2 puffs into the lungs 2 (two) times daily.      . Multiple Vitamin (MULTIVITAMIN) tablet Take 1 tablet by mouth daily.      . pentoxifylline (TRENTAL) 400 MG CR tablet Take 400 mg by mouth 2 (two) times daily.      . potassium chloride SA (K-DUR,KLOR-CON) 20 MEQ tablet Take 20 mEq by mouth 2 (two) times daily.      Marland Kitchen rOPINIRole (REQUIP) 1 MG tablet Take 1 mg by mouth at bedtime.      . simvastatin (ZOCOR) 40 MG tablet Take 40 mg by mouth every evening.      Marland Kitchen Spacer/Aero-Holding Chambers (AEROCHAMBER MV) inhaler Use as instructed  1  each  0  . [DISCONTINUED] cilostazol (PLETAL) 100 MG tablet Take 100 mg by mouth 2 (two) times daily.      . [DISCONTINUED] doxycycline (VIBRAMYCIN) 50 MG capsule       . [DISCONTINUED] isosorbide mononitrate (IMDUR) 30 MG 24 hr tablet         No Known Allergies  Physical Exam:  Filed Vitals:   01/08/13 1337  BP: 120/64  Pulse: 49  Temp: 98 F (36.7 C)  TempSrc: Oral  Height: 5\' 8"  (1.727 m)  Weight: 235 lb 9.6 oz (106.867 kg)  SpO2: 98%    Current Encounter SPO2  01/08/13 1337 98%  01/08/13 1159 97%  11/29/12 1553 97%  11/29/12 1551 97%  11/29/12 1546 97%  11/29/12 1500 98%  11/29/12 1430 97%  11/29/12 1400 94%  11/29/12 1341 95%  11/29/12 1324 94%  11/29/12 1310 95%  11/29/12 0803 95%    Body mass index is 35.82 kg/(m^2).   Wt Readings from Last 2 Encounters:  01/08/13 235 lb 9.6 oz (106.867 kg)  01/08/13 234 lb (106.142 kg)    General - No distress, obese, plethoric appearance  ENT - No sinus tenderness, clear nasal drainage, no oral exudate, no LAN, MP 3, no thrush Cardiac - s1s2 regular, no murmur Chest - decreased breath sounds, prolonged exhalation, no wheeze/rales/dullness  Back - no focal tenderness  Abd - soft, non-tender Ext - no edema Neuro - normal strength Skin - no rashes Psych - normal mood, and behavior   Assessment/Plan:  [name], MD [facility] Pager:  [phone] 01/08/2013, 1:45 PM

## 2013-01-08 NOTE — Assessment & Plan Note (Signed)
He is compliant with therapy and reports benefit.  Will arrange for ONO with CPAP and room air and then determine if he needs to continue supplemental oxygen at night.

## 2013-01-08 NOTE — Progress Notes (Signed)
[facility]   Patient name: Anthony [name] MRN: [id number] DOB: 1946/07/29 Sex: male     Chief Complaint  Patient presents with  . PVD    One month f/up  of Aortogram of Dec. 4, 2013    HISTORY OF PRESENT ILLNESS: The patient is back today for followup. He recently underwent angiography to better define his lower extremity anatomy. He suffers from bilateral claudication, right greater than left. He is medically managed for his diabetes, hypertension, and hyperlipidemia. He also suffers lower back pain. He was not a candidate for percutaneous intervention. He attempted a trial of cilostazol, which he had benefit from however had to discontinue it secondary to GI side effects. He also tried to cut the dose in half but again had side effects.  Past Medical History  Diagnosis Date  . CAD (coronary artery disease)     stent placed 06/02/2000  . Lumbar stenosis   . Diabetes mellitus   . Hyperlipidemia   . Congestive heart failure   . COPD (chronic obstructive pulmonary disease) 08/11/2012  . OSA (obstructive sleep apnea) 08/11/2012  . Chronic respiratory failure with hypoxia 08/11/2012  . Peripheral vascular disease     Past Surgical History  Procedure Date  . Tonsillectomy 1965  . Bunionectomy 1991 & 1994    right foot  . Neuroma surgery 1996    right foot  . Lumbar disc surgery Dec 1997 & Ampril 2005  . Cervical fusion Nov 2002  . Hammer toe surgery June 2007 & August 2008    left foot  . Carpal tunnel release 09/30/2006    right wrist  . Hand arthroplasty Oct 2010    right thumb  . Heart catherization 03/26/1999  . Angioplasty 06/02/2000    Stent  . Spine surgery 04/21/2004    Lower back disk gurgery- Lumbar stenosis  . Abdominal angiogram Dec. 4, 2013    History   Social History  . Marital Status: Married    Spouse Name: N/A    Number of Children: N/A  . Years of Education: N/A   Occupational History  . retired    Social History Main  Topics  . Smoking status: Former Smoker -- 2.0 packs/day for 28 years    Types: Cigarettes    Quit date: 08/03/1987  . Smokeless tobacco: Never Used  . Alcohol Use: No  . Drug Use: No  . Sexually Active: Not on file   Other Topics Concern  . Not on file   Social History Narrative  . No narrative on file    Family History  Problem Relation Age of Onset  . Heart disease Father 66  . Cancer Father   . Hyperlipidemia Father   . Hypertension Father   . Heart attack Father   . Cancer Mother   . Deep vein thrombosis Mother     Varicose veins  . Diabetes Mother   . Hyperlipidemia Mother   . Hypertension Mother     Allergies as of 01/08/2013  . (No Known Allergies)    Current Outpatient Prescriptions on File Prior to Visit  Medication Sig Dispense Refill  . aspirin 325 MG tablet Take 325 mg by mouth daily.      . B Complex Vitamins (VITAMIN B COMPLEX PO) Take 1 tablet by mouth 2 (two) times daily.       . bisoprolol (ZEBETA) 5 MG tablet Take 5 mg by mouth daily.      Marland Kitchen CINNAMON PO  Take 2 tablets by mouth daily.      Marland Kitchen doxazosin (CARDURA) 4 MG tablet Take 2 mg by mouth at bedtime.       Marland Kitchen doxycycline (ADOXA) 50 MG tablet Take 50 mg by mouth daily.      . fish oil-omega-3 fatty acids 1000 MG capsule Take 2 g by mouth daily.       . furosemide (LASIX) 20 MG tablet Take 20 mg by mouth daily.       Marland Kitchen GARLIC PO Take 1 tablet by mouth daily.      Marland Kitchen glipiZIDE (GLUCOTROL XL) 5 MG 24 hr tablet Take 5 mg by mouth daily.      . isosorbide dinitrate (ISORDIL) 30 MG tablet Take 30 mg by mouth daily.       Marland Kitchen L-Lysine 1000 MG TABS Take 1,000 mg by mouth daily.       . Liraglutide 18 MG/3ML SOLN Inject 6 mg into the skin daily.       Marland Kitchen losartan (COZAAR) 100 MG tablet Take 100 mg by mouth daily.      . metFORMIN (GLUCOPHAGE) 1000 MG tablet Take 1,000 mg by mouth 2 (two) times daily with a meal.      . mometasone-formoterol (DULERA) 100-5 MCG/ACT AERO Inhale 2 puffs into the lungs 2 (two)  times daily.      . Multiple Vitamin (MULTIVITAMIN) tablet Take 1 tablet by mouth daily.      . potassium chloride SA (K-DUR,KLOR-CON) 20 MEQ tablet Take 20 mEq by mouth 2 (two) times daily.      Marland Kitchen rOPINIRole (REQUIP) 1 MG tablet Take 1 mg by mouth at bedtime.      . simvastatin (ZOCOR) 40 MG tablet Take 40 mg by mouth every evening.      Marland Kitchen Spacer/Aero-Holding Chambers (AEROCHAMBER MV) inhaler Use as instructed  1 each  0  . cilostazol (PLETAL) 100 MG tablet Take 100 mg by mouth 2 (two) times daily.         REVIEW OF SYSTEMS: No changes from prior visit  PHYSICAL EXAMINATION:   Vital signs are BP 132/77  Pulse 58  Resp 16  Ht 5\' 8"  (1.727 m)  Wt 234 lb (106.142 kg)  BMI 35.58 kg/m2  SpO2 97% General: The patient appears their stated age. HEENT:  No gross abnormalities Pulmonary:  Non labored breathing Musculoskeletal: There are no major deformities. Neurologic: No focal weakness or paresthesias are detected, Skin: There are no ulcer or rashes noted. Psychiatric: The patient has normal affect. Cardiovascular: There is a regular rate and rhythm without significant murmur appreciated. No carotid bruits   Diagnostic Studies None  Assessment: Peripheral vascular disease without ulceration Plan: I reviewed the patient's angiogram with the patient and his wife. I do not feel that he is a candidate for percutaneous intervention given that his disease is in the right common femoral artery as well as in the right popliteal artery behind the knee. In order to proceed with revascularization, he would need a right femoral endarterectomy with below knee popliteal artery bypass graft. I do not feel that his symptoms are severe enough to warrant such an operation. I had extensive discussion with the patient detailing an exercise program. We also discussed the importance of daily foot examinations. If he were to develop wounds or an infection this would necessitate revascularization. Since he  responded to Pletal but could not tolerate it secondary to GI side effects, I have given him a prescription for Trental.  I have scheduled him to come back to see me in one year, however I have told him that if his symptoms get worse for him to call or contact me sooner.  Jorge Ny, M.D. [facility] Office: [phone] Pager:  [phone]

## 2013-01-23 ENCOUNTER — Telehealth: Payer: Self-pay | Admitting: Pulmonary Disease

## 2013-01-23 DIAGNOSIS — G4733 Obstructive sleep apnea (adult) (pediatric): Secondary | ICD-10-CM

## 2013-01-23 NOTE — Telephone Encounter (Signed)
ONO with CPAP and RA 01/18/13 >> test time 10 hrs 37 min.  Mean SpO2 94%, low SpO2 86%.  Spent 16 sec with SpO2 < 88%.  Will have my nurse inform pt that ONO looked good.  He no longer needs to use oxygen at night.  He needs to continue to use CPAP with sleep.  Please inform pt that I have sent order to have home oxygen set up removed.

## 2013-01-24 NOTE — Telephone Encounter (Signed)
I spoke with patient about results and he verbalized understanding and had no questions 

## 2013-01-29 ENCOUNTER — Other Ambulatory Visit (HOSPITAL_COMMUNITY): Payer: Self-pay | Admitting: *Deleted

## 2013-01-29 MED ORDER — ISOSORBIDE MONONITRATE ER 30 MG PO TB24: 30.0000 mg | ORAL_TABLET | Freq: Every day | ORAL | Status: DC

## 2013-02-01 ENCOUNTER — Encounter: Payer: Self-pay | Admitting: Pulmonary Disease

## 2013-04-24 ENCOUNTER — Other Ambulatory Visit (HOSPITAL_COMMUNITY): Payer: Self-pay | Admitting: Internal Medicine

## 2013-06-11 ENCOUNTER — Other Ambulatory Visit: Payer: Self-pay | Admitting: *Deleted

## 2013-06-11 DIAGNOSIS — I739 Peripheral vascular disease, unspecified: Secondary | ICD-10-CM

## 2013-06-11 MED ORDER — PENTOXIFYLLINE ER 400 MG PO TBCR: 400.0000 mg | EXTENDED_RELEASE_TABLET | Freq: Two times a day (BID) | ORAL | Status: DC

## 2013-07-14 ENCOUNTER — Other Ambulatory Visit (HOSPITAL_COMMUNITY): Payer: Self-pay | Admitting: Internal Medicine

## 2013-07-19 ENCOUNTER — Other Ambulatory Visit: Payer: Self-pay | Admitting: *Deleted

## 2013-07-19 DIAGNOSIS — I739 Peripheral vascular disease, unspecified: Secondary | ICD-10-CM

## 2013-07-19 MED ORDER — PENTOXIFYLLINE ER 400 MG PO TBCR: 400.0000 mg | EXTENDED_RELEASE_TABLET | Freq: Two times a day (BID) | ORAL | Status: DC

## 2013-07-24 ENCOUNTER — Encounter: Payer: Self-pay | Admitting: Pulmonary Disease

## 2013-07-24 ENCOUNTER — Ambulatory Visit (INDEPENDENT_AMBULATORY_CARE_PROVIDER_SITE_OTHER): Payer: MEDICARE | Admitting: Pulmonary Disease

## 2013-07-24 VITALS — BP 118/72 | HR 59 | Temp 98.2°F | Ht 69.0 in | Wt 243.8 lb

## 2013-07-24 DIAGNOSIS — G4733 Obstructive sleep apnea (adult) (pediatric): Secondary | ICD-10-CM

## 2013-07-24 DIAGNOSIS — R490 Dysphonia: Secondary | ICD-10-CM

## 2013-07-24 DIAGNOSIS — J449 Chronic obstructive pulmonary disease, unspecified: Secondary | ICD-10-CM

## 2013-07-24 MED ORDER — TIOTROPIUM BROMIDE MONOHYDRATE 18 MCG IN CAPS: 18.0000 ug | ORAL_CAPSULE | Freq: Every day | RESPIRATORY_TRACT | Status: DC

## 2013-07-24 NOTE — Assessment & Plan Note (Signed)
Will change to spiriva and stop dulera.

## 2013-07-24 NOTE — Progress Notes (Signed)
Chief Complaint  Patient presents with  . Sleep Apnea    Pt is using cpap every night for 7-8 hours a night without any complaints.   Marland Kitchen COPD    pt denies any increase in sob, or cough. he is still having hoarsenss and decreased dulera to once a day and hoarsenss is some improved.     History of Present Illness: Anthony [name] is a 67 y.o. male former smoker with dyspnea 2nd to COPD, right hemidiaphragm paralysis, and OSA.  He is still having hoarseness.  He was using flonase, and this helped his sinus congestion, but his hoarseness persisted.  He decreased dulera to two puffs in the morning, and this helped his hoarseness.  He is not having cough, wheeze, or sputum.  He keeps up with his activities okay.  He is using CPAP every night and feels this helps.  Tests: PFT 08/13/12>>FEV1 1.83 (62%), FEV1% 64, TLC 5.17 (82%), DLCO 67%, +BD SNIFF test 08/14/12>>Paradoxical motion of the right hemidiaphragm with inspiration.  PSG 08/14/12>>AHI 38.5.  CPAP 13 cm H2O with 1 liter oxygen. Echo 10/09/12>>mild LVH, EF 50 to 55%, grade 1 diastolic dysfx, mild LA dilation, PAS 35 mmHg CPAP 09/26/12 to 10/25/12>>Used on 30 of 30 nights with average 8 hrs 48 min. Average AHI 0.6 with CPAP 13 cm H2O. ONO with CPAP and RA 01/18/13 >> test time 10 hrs 37 min. Mean SpO2 94%, low SpO2 86%. Spent 16 sec with SpO2 < 88%.  He  has a past medical history of CAD (coronary artery disease); Lumbar stenosis; Diabetes mellitus; Hyperlipidemia; Congestive heart failure; COPD (chronic obstructive pulmonary disease) (08/11/2012); OSA (obstructive sleep apnea) (08/11/2012); Chronic respiratory failure with hypoxia (08/11/2012); and Peripheral vascular disease.  He  has past surgical history that includes Tonsillectomy (1965); Bunionectomy (1991 & 1994); Neuroma surgery (1996); Lumbar disc surgery (Dec 1997 & Ampril 2005); Cervical fusion (Nov 2002); Hammer toe surgery (June 2007 & August 2008); Carpal tunnel release (09/30/2006);  Hand Arthroplasty (Oct 2010); Heart catherization (03/26/1999); Angioplasty (06/02/2000); Spine surgery (04/21/2004); and Abdominal angiogram (Dec. 4, 2013).   Outpatient Encounter Prescriptions as of 07/24/2013  Medication Sig Dispense Refill  . aspirin 325 MG tablet Take 325 mg by mouth daily.      . B Complex Vitamins (VITAMIN B COMPLEX PO) Take 1 tablet by mouth 2 (two) times daily.       . bisoprolol (ZEBETA) 5 MG tablet TAKE 1 TABLET DAILY  90 tablet  0  . CINNAMON PO Take 2 tablets by mouth daily.      Marland Kitchen doxazosin (CARDURA) 4 MG tablet Take 2 mg by mouth at bedtime.       Marland Kitchen doxycycline (ADOXA) 50 MG tablet Take 50 mg by mouth daily.      . fish oil-omega-3 fatty acids 1000 MG capsule Take 2 g by mouth daily.       . fluticasone (FLONASE) 50 MCG/ACT nasal spray Place 2 sprays into the nose daily.  16 g  2  . furosemide (LASIX) 20 MG tablet Take 20 mg by mouth daily.       Marland Kitchen GARLIC PO Take 1 tablet by mouth daily.      Marland Kitchen glipiZIDE (GLUCOTROL XL) 5 MG 24 hr tablet Take 5 mg by mouth daily.      . isosorbide mononitrate (IMDUR) 30 MG 24 hr tablet Take 1 tablet (30 mg total) by mouth daily.  90 tablet  3  . L-Lysine 1000 MG TABS Take 1,000 mg  by mouth daily.       Marland Kitchen losartan (COZAAR) 100 MG tablet Take 100 mg by mouth daily.      . metFORMIN (GLUCOPHAGE) 1000 MG tablet Take 1,000 mg by mouth 2 (two) times daily with a meal.      . mometasone-formoterol (DULERA) 100-5 MCG/ACT AERO Inhale 2 puffs into the lungs daily.       . Multiple Vitamin (MULTIVITAMIN) tablet Take 1 tablet by mouth daily.      . pentoxifylline (TRENTAL) 400 MG CR tablet Take 1 tablet (400 mg total) by mouth 2 (two) times daily.  180 tablet  2  . potassium chloride SA (K-DUR,KLOR-CON) 20 MEQ tablet Take 20 mEq by mouth 2 (two) times daily.      Marland Kitchen rOPINIRole (REQUIP) 1 MG tablet Take 1 mg by mouth at bedtime.      . simvastatin (ZOCOR) 40 MG tablet Take 40 mg by mouth every evening.      Marland Kitchen Spacer/Aero-Holding Chambers  (AEROCHAMBER MV) inhaler Use as instructed  1 each  0  . [DISCONTINUED] bisoprolol (ZEBETA) 5 MG tablet Take 5 mg by mouth daily.      . [DISCONTINUED] Liraglutide 18 MG/3ML SOLN Inject 6 mg into the skin daily.        No facility-administered encounter medications on file as of 07/24/2013.    No Known Allergies  Physical Exam:  General - No distress ENT - No sinus tenderness, clear nasal drainage, no oral exudate, no LAN, MP 3 Cardiac - s1s2 regular, no murmur Chest - decreased breath sounds, prolonged exhalation, no wheeze/rales/dullness  Back - no focal tenderness  Abd - soft, non-tender Ext - no edema Neuro - normal strength Skin - no rashes Psych - normal mood, and behavior   Assessment/Plan:  [name], MD [facility] Pager:  [phone] 07/24/2013, 2:02 PM

## 2013-07-24 NOTE — Assessment & Plan Note (Signed)
He is compliant with therapy and reports benefit from CPAP.     

## 2013-07-24 NOTE — Assessment & Plan Note (Signed)
This persists after improving his post-nasal drip.  Will stop dulera and give trial of spiriva.  He is to call in two weeks to update status, and then decide if he wants to continue spiriva.  Discussed side effects to monitor for while using spiriva.

## 2013-07-24 NOTE — Patient Instructions (Signed)
Stop dulera Spiriva one puff daily Call in two weeks to update status Follow up in 6 months

## 2013-08-01 ENCOUNTER — Telehealth: Payer: Self-pay | Admitting: Pulmonary Disease

## 2013-08-01 ENCOUNTER — Other Ambulatory Visit: Payer: Self-pay

## 2013-08-01 MED ORDER — TIOTROPIUM BROMIDE MONOHYDRATE 18 MCG IN CAPS: 18.0000 ug | ORAL_CAPSULE | Freq: Every day | RESPIRATORY_TRACT | Status: DC

## 2013-08-01 NOTE — Telephone Encounter (Signed)
I spoke with pt and he just needed RX [facility] I advised will send RX nothing further was needed

## 2013-09-27 ENCOUNTER — Encounter (HOSPITAL_COMMUNITY): Payer: Self-pay

## 2013-09-27 ENCOUNTER — Ambulatory Visit (HOSPITAL_COMMUNITY)
Admission: RE | Admit: 2013-09-27 | Discharge: 2013-09-27 | Disposition: A | Discharge status: Home / Self Care | Payer: MEDICARE | Source: Ambulatory Visit | Attending: Internal Medicine | Admitting: Internal Medicine

## 2013-09-27 VITALS — BP 116/52 | HR 67 | Ht 69.0 in | Wt 244.8 lb

## 2013-09-27 DIAGNOSIS — Z7982 Long term (current) use of aspirin: Secondary | ICD-10-CM | POA: Insufficient documentation

## 2013-09-27 DIAGNOSIS — J4489 Other specified chronic obstructive pulmonary disease: Secondary | ICD-10-CM | POA: Insufficient documentation

## 2013-09-27 DIAGNOSIS — I2510 Atherosclerotic heart disease of native coronary artery without angina pectoris: Secondary | ICD-10-CM

## 2013-09-27 DIAGNOSIS — R079 Chest pain, unspecified: Secondary | ICD-10-CM | POA: Insufficient documentation

## 2013-09-27 DIAGNOSIS — I10 Essential (primary) hypertension: Secondary | ICD-10-CM

## 2013-09-27 DIAGNOSIS — J449 Chronic obstructive pulmonary disease, unspecified: Secondary | ICD-10-CM | POA: Insufficient documentation

## 2013-09-27 DIAGNOSIS — I739 Peripheral vascular disease, unspecified: Secondary | ICD-10-CM | POA: Insufficient documentation

## 2013-09-27 DIAGNOSIS — I509 Heart failure, unspecified: Secondary | ICD-10-CM | POA: Insufficient documentation

## 2013-09-27 DIAGNOSIS — Z9861 Coronary angioplasty status: Secondary | ICD-10-CM | POA: Insufficient documentation

## 2013-09-27 DIAGNOSIS — E785 Hyperlipidemia, unspecified: Secondary | ICD-10-CM | POA: Insufficient documentation

## 2013-09-27 DIAGNOSIS — E119 Type 2 diabetes mellitus without complications: Secondary | ICD-10-CM | POA: Insufficient documentation

## 2013-09-27 DIAGNOSIS — R0789 Other chest pain: Secondary | ICD-10-CM | POA: Insufficient documentation

## 2013-09-27 DIAGNOSIS — G4733 Obstructive sleep apnea (adult) (pediatric): Secondary | ICD-10-CM | POA: Insufficient documentation

## 2013-09-27 DIAGNOSIS — M48061 Spinal stenosis, lumbar region without neurogenic claudication: Secondary | ICD-10-CM | POA: Insufficient documentation

## 2013-09-27 DIAGNOSIS — Z79899 Other long term (current) drug therapy: Secondary | ICD-10-CM | POA: Insufficient documentation

## 2013-09-27 DIAGNOSIS — Z87891 Personal history of nicotine dependence: Secondary | ICD-10-CM | POA: Insufficient documentation

## 2013-09-27 DIAGNOSIS — J9610 Chronic respiratory failure, unspecified whether with hypoxia or hypercapnia: Secondary | ICD-10-CM | POA: Insufficient documentation

## 2013-09-27 MED ORDER — BISOPROLOL FUMARATE 5 MG PO TABS: ORAL_TABLET | ORAL | Status: DC

## 2013-09-27 NOTE — Addendum Note (Signed)
Encounter addended by: Noralee Space, RN on: 09/27/2013  3:01 PM<BR>     Documentation filed: Orders

## 2013-09-27 NOTE — Progress Notes (Signed)
Patient ID: [name], male   DOB: Sep 03, 1946, 67 y.o.   MRN: [id number] PCP: Creola Corn, MD  HPI:  Anthony [name] is a 67 y/o male with h/o obesity, HTN, HL, severe osteoarthritis, DM2 (diagnosed 2009), OSA (intolerant of CPAP), former smoker (quit 1989), CAD s/p stent 2001.  Recently moved here from [place] and here to establish cardiac care. Had cath in 2001. Was told he had 70% blockage in one artery and the one in the back was totally blocked. Eventually underwent stenting of the 70%. No caths since. Had stress test every year. Last one was May 2012. Was told it was normal.  We saw him a few months ago. Had exertional desaturations. Started on home O2. Scheduled f/u with Dr. Craige Cotta.  PFTs with moderate COPD FEV1 1.83 (62%) FVC   2.84 (66%) FEF 25-75% 0.79 (29%) DLCO 67%  ECHO 10/13 EF 50-55%  CXR with elevated R hemidiaphragm. Sleep study with severe OS - AHI 39. Weight initially 285 pounds lost down to 235. Now back at 245.    Has severe PAD R>L followed by Dr. Myra Gianotti. Treating medically for now. Now on Trental. Says he is doing fairly well. Claudication is limiting. C/o chest tightness. Started about 2 weeks ago. Worse when taking deep breaths and in evening. Seems to be getting some better. No HF.     Past Medical History  Diagnosis Date  . CAD (coronary artery disease)     stent placed 06/02/2000  . Lumbar stenosis   . Diabetes mellitus   . Hyperlipidemia   . Congestive heart failure   . COPD (chronic obstructive pulmonary disease) 08/11/2012  . OSA (obstructive sleep apnea) 08/11/2012  . Chronic respiratory failure with hypoxia 08/11/2012  . Peripheral vascular disease     Current Outpatient Prescriptions  Medication Sig Dispense Refill  . aspirin 325 MG tablet Take 325 mg by mouth daily.      . B Complex Vitamins (VITAMIN B COMPLEX PO) Take 1 tablet by mouth 2 (two) times daily.       . bisoprolol (ZEBETA) 5 MG tablet TAKE 1 TABLET DAILY  90 tablet  0  . CINNAMON PO Take  2 tablets by mouth daily.      Marland Kitchen doxazosin (CARDURA) 4 MG tablet Take 2 mg by mouth at bedtime.       Marland Kitchen doxycycline (ADOXA) 50 MG tablet Take 50 mg by mouth daily.      . fish oil-omega-3 fatty acids 1000 MG capsule Take 2 g by mouth daily.       . fluticasone (FLONASE) 50 MCG/ACT nasal spray Place 2 sprays into the nose daily.  16 g  2  . furosemide (LASIX) 20 MG tablet Take 20 mg by mouth daily.       Marland Kitchen GARLIC PO Take 1 tablet by mouth daily.      Marland Kitchen glipiZIDE (GLUCOTROL XL) 5 MG 24 hr tablet Take 5 mg by mouth daily.      . isosorbide mononitrate (IMDUR) 30 MG 24 hr tablet Take 1 tablet (30 mg total) by mouth daily.  90 tablet  3  . L-Lysine 1000 MG TABS Take 1,000 mg by mouth daily.       Marland Kitchen losartan (COZAAR) 100 MG tablet Take 100 mg by mouth daily.      . metFORMIN (GLUCOPHAGE) 1000 MG tablet Take 1,000 mg by mouth 2 (two) times daily with a meal.      . Multiple Vitamin (MULTIVITAMIN) tablet Take 1  tablet by mouth daily.      . pentoxifylline (TRENTAL) 400 MG CR tablet Take 1 tablet (400 mg total) by mouth 2 (two) times daily.  180 tablet  2  . potassium chloride SA (K-DUR,KLOR-CON) 20 MEQ tablet Take 20 mEq by mouth 2 (two) times daily.      Marland Kitchen rOPINIRole (REQUIP) 1 MG tablet Take 1 mg by mouth at bedtime.      . simvastatin (ZOCOR) 40 MG tablet Take 40 mg by mouth every evening.      . tiotropium (SPIRIVA) 18 MCG inhalation capsule Place 1 capsule (18 mcg total) into inhaler and inhale daily.  90 capsule  1  . traMADol (ULTRAM) 50 MG tablet Take 50 mg by mouth every 6 (six) hours as needed for pain.       No current facility-administered medications for this encounter.     No Known Allergies  History   Social History  . Marital Status: Married    Spouse Name: N/A    Number of Children: N/A  . Years of Education: N/A   Occupational History  . retired    Social History Main Topics  . Smoking status: Former Smoker -- 2.00 packs/day for 28 years    Types: Cigarettes    Quit  date: 08/03/1987  . Smokeless tobacco: Never Used  . Alcohol Use: No  . Drug Use: No  . Sexual Activity: Not on file   Other Topics Concern  . Not on file   Social History Narrative  . No narrative on file    Family History  Problem Relation Age of Onset  . Heart disease Father 42  . Cancer Father   . Hyperlipidemia Father   . Hypertension Father   . Heart attack Father   . Cancer Mother   . Deep vein thrombosis Mother     Varicose veins  . Diabetes Mother   . Hyperlipidemia Mother   . Hypertension Mother     PHYSICAL EXAM: Filed Vitals:   09/27/13 1409  BP: 116/52  Pulse: 67  Height: 5\' 9"  (1.753 m)  Weight: 244 lb 12.8 oz (111.041 kg)  SpO2: 95%    General:  No acute distress.. No respiratory difficulty HEENT: normal + mild rosacea  Neck: supple. JVP 7. Carotids 2+ bilat; no bruits. No lymphadenopathy or thryomegaly appreciated. Cor: PMI nondisplaced. Regular rate & rhythm. No rubs, gallops or murmurs. Lungs: clear with mildly diminished  air movment throughout Abdomen: soft, obese nontender, nondistended. No bruits or masses. Good bowel sounds. Extremities: no cyanosis, clubbing, rash, no edema. +stasis dermatitis  DP pulses non-palpable Neuro: alert & oriented x 3, cranial nerves grossly intact. moves all 4 extremities w/o difficulty. Affect pleasant.    ASSESSMENT & PLAN:  1) Chest tightness 2) CAD 3) PAD 4) DM2 5) HTN  Chest tightness with typical and atypical features. Will proceed with Lexiscan Myoview (unable to walk TM due to PAD). Lipids followed by Dr. Timothy Lasso. Continue ASA and statin. Blood pressure well controlled. Continue current regimen.   Truman Hayward 2:31 PM

## 2013-09-28 NOTE — Addendum Note (Signed)
Encounter addended by: Simon Rhein, CCT on: 09/28/2013 10:21 AM<BR>     Documentation filed: Charges VN

## 2013-09-28 NOTE — Addendum Note (Signed)
Encounter addended by: Theresia Bough, CMA on: 09/28/2013  9:44 AM<BR>     Documentation filed: Orders

## 2013-10-16 ENCOUNTER — Encounter (HOSPITAL_COMMUNITY): Payer: MEDICARE

## 2013-10-18 ENCOUNTER — Ambulatory Visit (HOSPITAL_COMMUNITY): Payer: MEDICARE | Attending: Internal Medicine | Admitting: Radiology

## 2013-10-18 VITALS — BP 174/88 | HR 61 | Ht 69.0 in | Wt 242.0 lb

## 2013-10-18 DIAGNOSIS — R0602 Shortness of breath: Secondary | ICD-10-CM

## 2013-10-18 DIAGNOSIS — I2510 Atherosclerotic heart disease of native coronary artery without angina pectoris: Secondary | ICD-10-CM

## 2013-10-18 DIAGNOSIS — R0789 Other chest pain: Secondary | ICD-10-CM | POA: Insufficient documentation

## 2013-10-18 DIAGNOSIS — R079 Chest pain, unspecified: Secondary | ICD-10-CM

## 2013-10-18 MED ORDER — TECHNETIUM TC 99M SESTAMIBI GENERIC - CARDIOLITE
30.0000 | Freq: Once | INTRAVENOUS | Status: AC | PRN
  Administered 2013-10-18: 30 via INTRAVENOUS

## 2013-10-18 MED ORDER — TECHNETIUM TC 99M SESTAMIBI GENERIC - CARDIOLITE
10.0000 | Freq: Once | INTRAVENOUS | Status: AC | PRN
  Administered 2013-10-18: 10 via INTRAVENOUS

## 2013-10-18 MED ORDER — REGADENOSON 0.4 MG/5ML IV SOLN
0.4000 mg | Freq: Once | INTRAVENOUS | Status: AC
  Administered 2013-10-18: 0.4 mg via INTRAVENOUS

## 2013-10-18 NOTE — Progress Notes (Signed)
[facility] 3 NUCLEAR MED [address] [place], [place] [address] [phone]    Cardiology Nuclear Med Study  Anthony [name] is a 67 y.o. male     MRN : [id number]     DOB: 09-11-1946  Procedure Date: 10/18/2013  Nuclear Med Background Indication for Stress Test:  Evaluation for Ischemia and Stent Patency History:  Stent; OZH:[id number] (no report); Echo Cardiac Risk Factors: Family History - CAD, History of Smoking, Hypertension, Lipids, NIDDM, Obesity and PVD  Symptoms:  Chest Tightness (last date of chest discomfort was about 2-weeks ago) and DOE   Nuclear Pre-Procedure Caffeine/Decaff Intake:  None NPO After: 7:00pm   Lungs:  Clear. O2 Sat: 97% on room air. IV 0.9% NS with Angio Cath:  22g  IV Site: R Hand  IV Started by:  Bonnita Levan, RN  Chest Size (in):  48 Cup Size: n/a  Height: 5\' 9"  (1.753 m)  Weight:  242 lb (109.77 kg)  BMI:  Body mass index is 35.72 kg/(m^2). Tech Comments:  Trental held > 72 hrs    Nuclear Med Study 1 or 2 day study: 1 day  Stress Test Type:  Lexiscan  Reading MD: Dietrich Pates, MD  Order Authorizing Provider:  Arvilla Meres, MD  Resting Radionuclide: Technetium 59m Sestamibi  Resting Radionuclide Dose: 11.0 mCi   Stress Radionuclide:  Technetium 87m Sestamibi  Stress Radionuclide Dose: 33.0 mCi           Stress Protocol Rest HR: 61 Stress HR: 83  Rest BP: 174/88 Stress BP: 182/92  Exercise Time (min): n/a METS: n/a   Predicted Max HR: 151 bpm % Max HR: 54.97 bpm Rate Pressure Product: 46962   Dose of Adenosine (mg):  n/a Dose of Lexiscan: 0.4 mg  Dose of Atropine (mg): n/a Dose of Dobutamine: n/a mcg/kg/min (at max HR)  Stress Test Technologist: Smiley Houseman, CMA-N  Nuclear Technologist:  Harlow Asa, CNMT     Rest Procedure:  Myocardial perfusion imaging was performed at rest 45 minutes following the intravenous administration of Technetium 31m Sestamibi.  Rest ECG: NSR - Normal EKG  Stress Procedure:   The patient received IV Lexiscan 0.4 mg over 15-seconds.  Technetium 16m Sestamibi injected at 30-seconds.  He c/o shortness of breath and tingling in fingers with Lexiscan.  Isolated PVC.  Quantitative spect images were obtained after a 45 minute delay.  Stress ECG: No significant change from baseline ECG  QPS Raw Data Images:  Soft tissue (diaphragm. Bowel) underlie heart.   Stress Images:  Normal homogeneous uptake in all areas of the myocardium. Rest Images:  Normal homogeneous uptake in all areas of the myocardium. Subtraction (SDS):  No evidence of ischemia. Transient Ischemic Dilatation (Normal <1.22):  1.04 Lung/Heart Ratio (Normal <0.45):  0.33  Quantitative Gated Spect Images QGS EDV:  141 ml QGS ESV:  62 ml  Impression Exercise Capacity:  Lexiscan with no exercise. BP Response:  Normal blood pressure response. Clinical Symptoms:  No chest pain. ECG Impression:  No significant ST segment change suggestive of ischemia. Comparison with Prior Nuclear Study: No images to compare  Overall Impression:  Normal stress nuclear study.  LV Ejection Fraction: 56%.  LV Wall Motion:  NL LV Function; NL Wall Motion  Dietrich Pates

## 2013-11-01 ENCOUNTER — Other Ambulatory Visit: Payer: Self-pay

## 2013-11-29 ENCOUNTER — Encounter: Payer: Self-pay | Admitting: Pulmonary Disease

## 2013-11-29 ENCOUNTER — Ambulatory Visit (INDEPENDENT_AMBULATORY_CARE_PROVIDER_SITE_OTHER): Payer: MEDICARE | Admitting: Pulmonary Disease

## 2013-11-29 VITALS — BP 130/80 | HR 66 | Ht 69.0 in | Wt 249.0 lb

## 2013-11-29 DIAGNOSIS — R490 Dysphonia: Secondary | ICD-10-CM

## 2013-11-29 DIAGNOSIS — J449 Chronic obstructive pulmonary disease, unspecified: Secondary | ICD-10-CM

## 2013-11-29 DIAGNOSIS — G4733 Obstructive sleep apnea (adult) (pediatric): Secondary | ICD-10-CM

## 2013-11-29 MED ORDER — ALBUTEROL SULFATE HFA 108 (90 BASE) MCG/ACT IN AERS: 1.0000 | INHALATION_SPRAY | Freq: Four times a day (QID) | RESPIRATORY_TRACT | Status: DC | PRN

## 2013-11-29 NOTE — Patient Instructions (Signed)
Can try changing spiriva two one puff every other day Proair two puffs up to four times per day as needed for cough, wheeze, or chest congestion Follow up in 6 months

## 2013-11-29 NOTE — Progress Notes (Signed)
Chief Complaint  Patient presents with  . COPD    Breathing is doing well. Reports DOE from time to time. Feels Spiriva is working well.    History of Present Illness: Anthony [name] is a 67 y.o. male former smoker with dyspnea 2nd to COPD, right hemidiaphragm paralysis, and OSA on CPAP 13 cm H2O.  He has been doing well.  He went on cruise to [place], and did not have any breathing difficulty with travel.  He feels spiriva is working well.  He is not having hoarseness.  He uses flonase occasionally.  He uses CPAP every night, and this works well.  His main limitation to activity at present is related to claudication.  Tests: PFT 08/13/12>>FEV1 1.83 (62%), FEV1% 64, TLC 5.17 (82%), DLCO 67%, +BD SNIFF test 08/14/12>>Paradoxical motion of the right hemidiaphragm with inspiration.  PSG 08/14/12>>AHI 38.5.  CPAP 13 cm H2O with 1 liter oxygen. Echo 10/09/12>>mild LVH, EF 50 to 55%, grade 1 diastolic dysfx, mild LA dilation, PAS 35 mmHg CPAP 09/26/12 to 10/25/12>>Used on 30 of 30 nights with average 8 hrs 48 min. Average AHI 0.6 with CPAP 13 cm H2O. ONO with CPAP and RA 01/18/13 >> test time 10 hrs 37 min. Mean SpO2 94%, low SpO2 86%. Spent 16 sec with SpO2 < 88%.  He  has a past medical history of CAD (coronary artery disease); Lumbar stenosis; Diabetes mellitus; Hyperlipidemia; Congestive heart failure; COPD (chronic obstructive pulmonary disease) (08/11/2012); OSA (obstructive sleep apnea) (08/11/2012); Chronic respiratory failure with hypoxia (08/11/2012); and Peripheral vascular disease.  He  has past surgical history that includes Tonsillectomy (1965); Bunionectomy (1991 & 1994); Neuroma surgery (1996); Lumbar disc surgery (Dec 1997 & Ampril 2005); Cervical fusion (Nov 2002); Hammer toe surgery (June 2007 & August 2008); Carpal tunnel release (09/30/2006); Hand Arthroplasty (Oct 2010); Heart catherization (03/26/1999); Angioplasty (06/02/2000); Spine surgery (04/21/2004); and Abdominal angiogram (Dec.  4, 2013).   Outpatient Encounter Prescriptions as of 11/29/2013  Medication Sig  . aspirin 325 MG tablet Take 325 mg by mouth daily.  . B Complex Vitamins (VITAMIN B COMPLEX PO) Take 1 tablet by mouth 2 (two) times daily.   . bisoprolol (ZEBETA) 5 MG tablet TAKE 1 TABLET DAILY  . CINNAMON PO Take 2 tablets by mouth daily.  Marland Kitchen doxazosin (CARDURA) 4 MG tablet Take 2 mg by mouth at bedtime.   Marland Kitchen doxycycline (ADOXA) 50 MG tablet Take 50 mg by mouth daily.  . fish oil-omega-3 fatty acids 1000 MG capsule Take 2 g by mouth daily.   . fluticasone (FLONASE) 50 MCG/ACT nasal spray Place 2 sprays into the nose at bedtime as needed.  . furosemide (LASIX) 20 MG tablet Take 20 mg by mouth daily.   Marland Kitchen GARLIC PO Take 1 tablet by mouth daily.  Marland Kitchen glipiZIDE (GLUCOTROL XL) 5 MG 24 hr tablet Take 5 mg by mouth daily.  . isosorbide mononitrate (IMDUR) 30 MG 24 hr tablet Take 1 tablet (30 mg total) by mouth daily.  Marland Kitchen L-Lysine 1000 MG TABS Take 1,000 mg by mouth daily.   Marland Kitchen losartan (COZAAR) 100 MG tablet Take 100 mg by mouth daily.  . metFORMIN (GLUCOPHAGE) 1000 MG tablet Take 1,000 mg by mouth 2 (two) times daily with a meal.  . Multiple Vitamin (MULTIVITAMIN) tablet Take 1 tablet by mouth daily.  . pentoxifylline (TRENTAL) 400 MG CR tablet Take 1 tablet (400 mg total) by mouth 2 (two) times daily.  . potassium chloride SA (K-DUR,KLOR-CON) 20 MEQ tablet Take 20 mEq  by mouth 2 (two) times daily.  Marland Kitchen rOPINIRole (REQUIP) 1 MG tablet Take 1 mg by mouth at bedtime.  . simvastatin (ZOCOR) 40 MG tablet Take 40 mg by mouth every evening.  . tiotropium (SPIRIVA) 18 MCG inhalation capsule Place 1 capsule (18 mcg total) into inhaler and inhale daily.  . traMADol (ULTRAM) 50 MG tablet Take 50 mg by mouth every 6 (six) hours as needed for pain.  . [DISCONTINUED] fluticasone (FLONASE) 50 MCG/ACT nasal spray Place 2 sprays into the nose daily.    No Known Allergies  Physical Exam:  General - No distress ENT - No sinus  tenderness, no oral exudate, no LAN, MP 3 Cardiac - s1s2 regular, no murmur Chest - decreased breath sounds, prolonged exhalation, no wheeze/rales/dullness  Back - no focal tenderness  Abd - soft, non-tender Ext - no edema Neuro - normal strength Skin - no rashes Psych - normal mood, and behavior   Assessment/Plan:  [name], MD  Pulmonary/Critical Care/Sleep Pager:  [phone] 11/29/2013, 4:24 PM

## 2013-11-29 NOTE — Assessment & Plan Note (Signed)
Much improved after stopping dulera.

## 2013-11-29 NOTE — Assessment & Plan Note (Signed)
He has done well since changing to spiriva.  Advised he could try decreasing dose of spiriva as tolerated.  Will provide him with proair rescue inhaler.

## 2013-11-29 NOTE — Assessment & Plan Note (Signed)
He is compliant with CPAP and reports benefit.  Continue CPAP 13 cm H2O.

## 2013-12-05 ENCOUNTER — Ambulatory Visit: Payer: Self-pay | Admitting: Podiatrist

## 2013-12-07 ENCOUNTER — Encounter: Payer: Self-pay | Admitting: Surgery

## 2013-12-10 ENCOUNTER — Ambulatory Visit (INDEPENDENT_AMBULATORY_CARE_PROVIDER_SITE_OTHER): Payer: MEDICARE | Admitting: Surgery

## 2013-12-10 ENCOUNTER — Encounter: Payer: Self-pay | Admitting: Surgery

## 2013-12-10 VITALS — BP 130/62 | HR 60 | Temp 98.0°F | Resp 14 | Ht 68.75 in | Wt 245.0 lb

## 2013-12-10 DIAGNOSIS — M79609 Pain in unspecified limb: Secondary | ICD-10-CM | POA: Insufficient documentation

## 2013-12-10 DIAGNOSIS — I739 Peripheral vascular disease, unspecified: Secondary | ICD-10-CM

## 2013-12-10 NOTE — Progress Notes (Signed)
Patient name: Anthony [name] MRN: [id number] DOB: Jun 07, 1946 Sex: male     Chief Complaint  Patient presents with  . PVD    1 year follow up    HISTORY OF PRESENT ILLNESS: The patient is back today for followup.  He suffers from bilateral claudication, right greater than left.  Approximately one year ago he underwent angiography which revealed severe disease within the right common femoral and right popliteal artery behind the knee.  At that time, I felt he would need a right femoral endarterectomy with below knee popliteal artery bypass grafting.  I did not feel that his symptoms were severe enough to warrant an operation.  I tried him on Pletal, however he could not tolerate it secondary to GI side effects, therefore I gave him a prescription for Trental at his  last visit.  He does feel like this is helping as he had to come off of it for catheterization and noticed a difference.  He denies rest pain or nonhealing ulcers.  The patient continues to be medically managed for diabetes, hypertension, and hyperlipidemia.  He does take a statin.  Past Medical History  Diagnosis Date  . CAD (coronary artery disease)     stent placed 06/02/2000  . Lumbar stenosis   . Diabetes mellitus   . Hyperlipidemia   . Congestive heart failure   . COPD (chronic obstructive pulmonary disease) 08/11/2012  . OSA (obstructive sleep apnea) 08/11/2012  . Chronic respiratory failure with hypoxia 08/11/2012  . Peripheral vascular disease     Past Surgical History  Procedure Laterality Date  . Tonsillectomy  1965  . Bunionectomy  1991 & 1994    right foot  . Neuroma surgery  1996    right foot  . Lumbar disc surgery  Dec 1997 & Ampril 2005  . Cervical fusion  Nov 2002  . Hammer toe surgery  June 2007 & August 2008    left foot  . Carpal tunnel release  09/30/2006    right wrist  . Hand arthroplasty  Oct 2010    right thumb  . Heart catherization  03/26/1999  . Angioplasty  06/02/2000    Stent  .  Spine surgery  04/21/2004    Lower back disk gurgery- Lumbar stenosis  . Abdominal angiogram  Dec. 4, 2013    History   Social History  . Marital Status: Married    Spouse Name: N/A    Number of Children: N/A  . Years of Education: N/A   Occupational History  . retired    Social History Main Topics  . Smoking status: Former Smoker -- 2.00 packs/day for 28 years    Types: Cigarettes    Quit date: 08/03/1987  . Smokeless tobacco: Never Used  . Alcohol Use: No  . Drug Use: No  . Sexual Activity: Not on file   Other Topics Concern  . Not on file   Social History Narrative  . No narrative on file    Family History  Problem Relation Age of Onset  . Heart disease Father 12  . Cancer Father   . Hyperlipidemia Father   . Hypertension Father   . Heart attack Father   . Cancer Mother   . Deep vein thrombosis Mother     Varicose veins  . Diabetes Mother   . Hyperlipidemia Mother   . Hypertension Mother     Allergies as of 12/10/2013  . (No Known Allergies)    Current Outpatient  Prescriptions on File Prior to Visit  Medication Sig Dispense Refill  . albuterol (PROAIR HFA) 108 (90 BASE) MCG/ACT inhaler Inhale 1-2 puffs into the lungs every 6 (six) hours as needed for wheezing or shortness of breath.  3 Inhaler  3  . aspirin 325 MG tablet Take 325 mg by mouth daily.      . B Complex Vitamins (VITAMIN B COMPLEX PO) Take 1 tablet by mouth 2 (two) times daily.       . bisoprolol (ZEBETA) 5 MG tablet TAKE 1 TABLET DAILY  90 tablet  3  . CINNAMON PO Take 2 tablets by mouth daily.      Marland Kitchen doxazosin (CARDURA) 4 MG tablet Take 2 mg by mouth at bedtime.       Marland Kitchen doxycycline (ADOXA) 50 MG tablet Take 50 mg by mouth daily.      . fish oil-omega-3 fatty acids 1000 MG capsule Take 2 g by mouth daily.       . fluticasone (FLONASE) 50 MCG/ACT nasal spray Place 2 sprays into the nose at bedtime as needed.      . furosemide (LASIX) 20 MG tablet Take 20 mg by mouth daily.       Marland Kitchen GARLIC PO  Take 1 tablet by mouth daily.      Marland Kitchen glipiZIDE (GLUCOTROL XL) 5 MG 24 hr tablet Take 5 mg by mouth daily.      . isosorbide mononitrate (IMDUR) 30 MG 24 hr tablet Take 1 tablet (30 mg total) by mouth daily.  90 tablet  3  . L-Lysine 1000 MG TABS Take 1,000 mg by mouth daily.       Marland Kitchen losartan (COZAAR) 100 MG tablet Take 100 mg by mouth daily.      . metFORMIN (GLUCOPHAGE) 1000 MG tablet Take 1,000 mg by mouth 2 (two) times daily with a meal.      . Multiple Vitamin (MULTIVITAMIN) tablet Take 1 tablet by mouth daily.      . pentoxifylline (TRENTAL) 400 MG CR tablet Take 1 tablet (400 mg total) by mouth 2 (two) times daily.  180 tablet  2  . potassium chloride SA (K-DUR,KLOR-CON) 20 MEQ tablet Take 20 mEq by mouth 2 (two) times daily.      Marland Kitchen rOPINIRole (REQUIP) 1 MG tablet Take 1 mg by mouth at bedtime.      . simvastatin (ZOCOR) 40 MG tablet Take 40 mg by mouth every evening.      . traMADol (ULTRAM) 50 MG tablet Take 50 mg by mouth every 6 (six) hours as needed for pain.       No current facility-administered medications on file prior to visit.     REVIEW OF SYSTEMS: Please see history of present illness, otherwise no changes from prior visit.  PHYSICAL EXAMINATION:   Vital signs are BP 130/62  Pulse 60  Temp(Src) 98 F (36.7 C) (Oral)  Resp 14  Ht 5' 8.75" (1.746 m)  Wt 245 lb (111.131 kg)  BMI 36.45 kg/m2  SpO2 100% General: The patient appears their stated age. HEENT:  No gross abnormalities Pulmonary:  Non labored breathing Musculoskeletal: There are no major deformities. Neurologic: No focal weakness or paresthesias are detected, Skin: There are no ulcer or rashes noted. Psychiatric: The patient has normal affect. Cardiovascular: There is a regular rate and rhythm without significant murmur appreciated.  Pedal pulses are not palpable.  No carotid bruits.   Diagnostic Studies None  Assessment: Bilateral claudication, right greater than left  Plan: The patient has had a  slight improvement in his symptomatology with the addition of Trental.  He is currently without rest pain or ulcers.  I again reviewed the natural history of peripheral vascular disease, as well as appropriate management strategies.  I again do not think he has symptoms severe enough to warrant revascularization below the knee.  We discussed the possibility of going to [facility] for stem cell treatment.  He will contact me if he wishes to do so.  Otherwise, he will followup with ultrasound in one year.  Jorge Ny, M.D. [facility] Office: [phone] Pager:  [phone]

## 2013-12-10 NOTE — Addendum Note (Signed)
Addended by: Sharee Pimple on: 12/10/2013 02:49 PM   Modules accepted: Orders

## 2013-12-12 ENCOUNTER — Ambulatory Visit: Payer: BC Managed Care – PPO | Admitting: Podiatrist

## 2013-12-17 ENCOUNTER — Ambulatory Visit: Payer: MEDICARE | Admitting: Surgery

## 2013-12-19 ENCOUNTER — Ambulatory Visit (INDEPENDENT_AMBULATORY_CARE_PROVIDER_SITE_OTHER): Payer: BC Managed Care – PPO | Admitting: Podiatrist

## 2013-12-19 ENCOUNTER — Encounter: Payer: Self-pay | Admitting: Podiatrist

## 2013-12-19 VITALS — BP 185/95 | HR 56 | Resp 18 | Ht 69.0 in | Wt 239.0 lb

## 2013-12-19 DIAGNOSIS — M79609 Pain in unspecified limb: Secondary | ICD-10-CM

## 2013-12-19 DIAGNOSIS — B351 Tinea unguium: Secondary | ICD-10-CM

## 2013-12-19 NOTE — Progress Notes (Signed)
  HPI:  Patient presents today for follow up of foot and nail care. Denies any new complaints today.  Objective:  Patients chart is reviewed.  Neurovascular status unchanged with non palpable pedal pulses, delayed capillary refill time- He is being followed by Dr. Myra Gianotti.  Patients nails are thickened, discolored, distrophic, friable and brittle with yellow-brown discoloration. Patient subjectively relates they are painful with shoes and with ambulation of bilateral feet.  Assessment:  Symptomatic onychomycosis, PVD   Plan:  Discussed treatment options and alternatives.  The symptomatic toenails were debrided through manual an mechanical means without complication.  Return appointment recommended at routine intervals of 3-5 months.  He goes to [place] in the wintertime    Marlowe Aschoff, DPM

## 2014-01-04 ENCOUNTER — Other Ambulatory Visit (HOSPITAL_COMMUNITY): Payer: Self-pay | Admitting: Cardiology

## 2014-01-04 DIAGNOSIS — I509 Heart failure, unspecified: Secondary | ICD-10-CM

## 2014-01-04 MED ORDER — ISOSORBIDE MONONITRATE ER 30 MG PO TB24: 30.0000 mg | ORAL_TABLET | Freq: Every day | ORAL | Status: DC

## 2014-01-14 ENCOUNTER — Ambulatory Visit: Payer: MEDICARE | Admitting: Family

## 2014-03-26 ENCOUNTER — Other Ambulatory Visit: Payer: Self-pay | Admitting: *Deleted

## 2014-03-26 DIAGNOSIS — I739 Peripheral vascular disease, unspecified: Secondary | ICD-10-CM

## 2014-03-26 MED ORDER — PENTOXIFYLLINE ER 400 MG PO TBCR
400.0000 mg | EXTENDED_RELEASE_TABLET | Freq: Two times a day (BID) | ORAL | Status: DC
Start: 2014-03-26 — End: 2015-01-02

## 2014-05-01 ENCOUNTER — Encounter (HOSPITAL_COMMUNITY): Payer: Self-pay

## 2014-05-01 ENCOUNTER — Ambulatory Visit (HOSPITAL_COMMUNITY)
Admission: RE | Admit: 2014-05-01 | Discharge: 2014-05-01 | Disposition: A | Discharge status: Home / Self Care | Payer: MEDICARE | Source: Ambulatory Visit | Attending: Internal Medicine | Admitting: Internal Medicine

## 2014-05-01 ENCOUNTER — Encounter: Payer: Self-pay | Admitting: Podiatrist

## 2014-05-01 ENCOUNTER — Ambulatory Visit (INDEPENDENT_AMBULATORY_CARE_PROVIDER_SITE_OTHER): Payer: BC Managed Care – PPO | Admitting: Podiatrist

## 2014-05-01 VITALS — BP 138/72 | HR 67 | Wt 247.0 lb

## 2014-05-01 VITALS — BP 168/82 | HR 60 | Resp 17 | Ht 69.0 in | Wt 247.0 lb

## 2014-05-01 DIAGNOSIS — E785 Hyperlipidemia, unspecified: Secondary | ICD-10-CM | POA: Insufficient documentation

## 2014-05-01 DIAGNOSIS — M5430 Sciatica, unspecified side: Secondary | ICD-10-CM | POA: Insufficient documentation

## 2014-05-01 DIAGNOSIS — E119 Type 2 diabetes mellitus without complications: Secondary | ICD-10-CM | POA: Insufficient documentation

## 2014-05-01 DIAGNOSIS — I2510 Atherosclerotic heart disease of native coronary artery without angina pectoris: Secondary | ICD-10-CM

## 2014-05-01 DIAGNOSIS — J449 Chronic obstructive pulmonary disease, unspecified: Secondary | ICD-10-CM | POA: Insufficient documentation

## 2014-05-01 DIAGNOSIS — L600 Ingrowing nail: Secondary | ICD-10-CM

## 2014-05-01 DIAGNOSIS — I10 Essential (primary) hypertension: Secondary | ICD-10-CM | POA: Insufficient documentation

## 2014-05-01 DIAGNOSIS — G4733 Obstructive sleep apnea (adult) (pediatric): Secondary | ICD-10-CM | POA: Insufficient documentation

## 2014-05-01 DIAGNOSIS — L03039 Cellulitis of unspecified toe: Secondary | ICD-10-CM

## 2014-05-01 DIAGNOSIS — J4489 Other specified chronic obstructive pulmonary disease: Secondary | ICD-10-CM | POA: Insufficient documentation

## 2014-05-01 DIAGNOSIS — M79609 Pain in unspecified limb: Secondary | ICD-10-CM

## 2014-05-01 DIAGNOSIS — B351 Tinea unguium: Secondary | ICD-10-CM

## 2014-05-01 DIAGNOSIS — I739 Peripheral vascular disease, unspecified: Secondary | ICD-10-CM | POA: Insufficient documentation

## 2014-05-01 DIAGNOSIS — Z87891 Personal history of nicotine dependence: Secondary | ICD-10-CM | POA: Insufficient documentation

## 2014-05-01 NOTE — Addendum Note (Signed)
Encounter addended by: Kerry Dory, CMA on: 05/01/2014 10:31 AM<BR>     Documentation filed: Patient Instructions Section

## 2014-05-01 NOTE — Progress Notes (Signed)
Patient ID: Anthony [name], male   DOB: [birth year], 68 y.o.   MRN: [id number]  PCP: [name], MD  HPI:  Devery is a 68 y/o male with h/o obesity, HTN, HL, severe osteoarthritis, DM2 (diagnosed 2009), OSA (intolerant of CPAP), former smoker (quit 1989), CAD s/p stent 2001 and PAD. He is the father-in-law of Dr. [name] in Interventional Radiology.    Had cath in [place] in 2001. Was told he had 70% blockage in one artery and the one in the back was totally blocked. Eventually underwent stenting of the 70%. No caths since. Myoview 10/14: Normal. LV Ejection Fraction: 56%. LV Wall Motion: NL LV Function; NL Wall Motion  PFTs with moderate COPD FEV1 1.83 (62%) FVC   2.84 (66%) FEF 25-75% 0.79 (29%) DLCO 67%  Saw Dr. Halford Chessman and placed on home O2 but now weaned off with inhalers. Checks sats with pulse oximeter  ECHO 10/13 EF 50-55%  CXR with elevated R hemidiaphragm. Sleep study with severe OS - AHI 39. Weight initially 285 pounds lost down to 235. Now back at 245.    Has severe PAD R>L followed by Dr. [name]. Treating medically for now. Now on Trental.   Says he is doing fairly well. Feels claudication is some worse R>L but main limiting factor now is left leg sciatica - unable to do his walking program. Trying to get in with [facility]. Wears CPAP at night. No dyspnea or CP. Weight back up about 10 pounds. (had lost 35 pounds).    Past Medical History  Diagnosis Date  . CAD (coronary artery disease)     stent placed 06/02/2000  . Lumbar stenosis   . Diabetes mellitus   . Hyperlipidemia   . Congestive heart failure   . COPD (chronic obstructive pulmonary disease) 08/11/2012  . OSA (obstructive sleep apnea) 08/11/2012  . Chronic respiratory failure with hypoxia 08/11/2012  . Peripheral vascular disease     Current Outpatient Prescriptions  Medication Sig Dispense Refill  . albuterol (PROAIR HFA) 108 (90 BASE) MCG/ACT inhaler Inhale 1-2 puffs into the lungs every 6 (six) hours as needed  for wheezing or shortness of breath.  3 Inhaler  3  . aspirin 325 MG tablet Take 325 mg by mouth daily.      . B Complex Vitamins (VITAMIN B COMPLEX PO) Take 1 tablet by mouth 2 (two) times daily.       . bisoprolol (ZEBETA) 5 MG tablet TAKE 1 TABLET DAILY  90 tablet  3  . CINNAMON PO Take 2 tablets by mouth daily.      Marland Kitchen doxazosin (CARDURA) 4 MG tablet Take 2 mg by mouth at bedtime.       Marland Kitchen doxycycline (ADOXA) 50 MG tablet Take 50 mg by mouth daily.      . fish oil-omega-3 fatty acids 1000 MG capsule Take 2 g by mouth daily.       . fluticasone (FLONASE) 50 MCG/ACT nasal spray Place 2 sprays into the nose at bedtime as needed.      . furosemide (LASIX) 20 MG tablet Take 20 mg by mouth daily.       Marland Kitchen GARLIC PO Take 1 tablet by mouth daily.      Marland Kitchen glipiZIDE (GLUCOTROL XL) 5 MG 24 hr tablet Take 5 mg by mouth daily.      . isosorbide mononitrate (IMDUR) 30 MG 24 hr tablet Take 1 tablet (30 mg total) by mouth daily.  90 tablet  3  .  L-Lysine 1000 MG TABS Take 1,000 mg by mouth daily.       Marland Kitchen losartan (COZAAR) 100 MG tablet Take 100 mg by mouth daily.      . metFORMIN (GLUCOPHAGE) 1000 MG tablet Take 1,000 mg by mouth 2 (two) times daily with a meal.      . MISC NATURAL PRODUCTS PO Take by mouth. TUMERIC capsules  500mg   One capsule twice a day.      . Multiple Vitamin (MULTIVITAMIN) tablet Take 1 tablet by mouth daily.      . pentoxifylline (TRENTAL) 400 MG CR tablet Take 1 tablet (400 mg total) by mouth 2 (two) times daily.  180 tablet  2  . potassium chloride SA (K-DUR,KLOR-CON) 20 MEQ tablet Take 20 mEq by mouth 2 (two) times daily.      Marland Kitchen rOPINIRole (REQUIP) 1 MG tablet Take 1 mg by mouth at bedtime.      . simvastatin (ZOCOR) 40 MG tablet Take 40 mg by mouth every evening.      . tiotropium (SPIRIVA) 18 MCG inhalation capsule Place 18 mcg into inhaler and inhale every other day.      . traMADol (ULTRAM) 50 MG tablet Take 50 mg by mouth every 6 (six) hours as needed for pain.       No  current facility-administered medications for this encounter.     No Known Allergies  History   Social History  . Marital Status: Married    Spouse Name: N/A    Number of Children: N/A  . Years of Education: N/A   Occupational History  . retired    Social History Main Topics  . Smoking status: Former Smoker -- 2.00 packs/day for 28 years    Types: Cigarettes    Quit date: 08/03/1987  . Smokeless tobacco: Never Used  . Alcohol Use: No  . Drug Use: No  . Sexual Activity: Not on file   Other Topics Concern  . Not on file   Social History Narrative  . No narrative on file    Family History  Problem Relation Age of Onset  . Heart disease Father 17  . Cancer Father   . Hyperlipidemia Father   . Hypertension Father   . Heart attack Father   . Cancer Mother   . Deep vein thrombosis Mother     Varicose veins  . Diabetes Mother   . Hyperlipidemia Mother   . Hypertension Mother     PHYSICAL EXAM: Filed Vitals:   05/01/14 0931  BP: 138/72  Pulse: 67  Weight: 247 lb (112.038 kg)  SpO2: 95%    General:  No acute distress..\ No respiratory difficulty HEENT: normal +rosacea  Neck: supple. JVP 6. Carotids 2+ bilat; no bruits. No lymphadenopathy or thryomegaly appreciated. Cor: PMI nondisplaced. Regular rate & rhythm. No rubs, gallops or murmurs. Lungs: clear with mildly diminished  air movment throughout Abdomen: soft, obese nontender, nondistended. No bruits or masses. Good bowel sounds. Extremities: no cyanosis, clubbing, rash, no edema. +stasis dermatitis  DP pulses non-palpable. Small healing ulcer on back of R foot Neuro: alert & oriented x 3, cranial nerves grossly intact. moves all 4 extremities w/o difficulty. Affect pleasant.    ASSESSMENT & PLAN:  1) CAD 2) PAD 3) DM2 4) HTN 5) Sciatica 6) OSA on CPAP  CAD stable without exertional symptoms. Recent Myoview looks good. Several prominent issues. Will refer him to NSU to evaluate sciatica (I  contacted Dr. Vertell Limber to arrange). Also likely needs  to f/u with Dr. [name] for PAD once sciatica issue straightened out. Stressed need to find alternate exercise he can tolerate and resume his Paleo diet to keep his weight down closer to 230.   Lipids followed by Dr. Virgina Jock. Continue ASA and statin. Blood pressure well controlled.   RTC in 6 months.   Kayleena Eke R Eilis Chestnutt,MD 10:04 AM

## 2014-05-01 NOTE — Progress Notes (Signed)
   HPI: Patient presents today for follow up of foot and nail care. He relates his right great toenail is digging  on the both borders and causing him to have significant discomfort. Objective: Patients chart is reviewed. Neurovascular status unchanged with non palpable pedal pulses, delayed capillary refill time- He is being followed by Dr. [name]. Patients nails are thickened, discolored, distrophic, friable and brittle with yellow-brown discoloration. Patient subjectively relates they are painful with shoes and with ambulation of bilateral feet. His right great toenail has ingrown medial and lateral nail borders. Redness swelling and pain with compression is also noted. No active pus, or purulence is present.  Assessment: Symptomatic onychomycosis, PVD , ingrown toenail right first  Plan: Discussed treatment options and alternatives. The symptomatic toenails were debrided through manual an mechanical means without complication. Discussed a permanent procedure to his right great toenail however I cannot do this procedure until he has a recent vascular study performed to assess healing potential. I did however perform a incision and drainage on the medial and lateral nail borders to open a channel to relieve the pressure. Antibiotic ointment and a dressing was applied and he was given instructions for aftercare. He will watch the area closely for any signs of infection will call if any of these arise. He is also encouraged to have his ABIs and vascular studies performed per Dr. Stephens Shire orders. Should he have adequate circulation for wound healing we'll consider permanent phenol matrixectomy right great toe.

## 2014-05-01 NOTE — Patient Instructions (Signed)
Your physician recommends that you schedule a follow-up appointment in: 6 months  Do the following things EVERYDAY: 1) Weigh yourself in the morning before breakfast. Write it down and keep it in a log. 2) Take your medicines as prescribed 3) Eat low salt foods-Limit salt (sodium) to 2000 mg per day.  4) Stay as active as you can everyday 5) Limit all fluids for the day to less than 2 liters 6)

## 2014-05-01 NOTE — Patient Instructions (Signed)

## 2014-05-03 ENCOUNTER — Other Ambulatory Visit: Payer: Self-pay | Admitting: Neurosurgery

## 2014-05-03 DIAGNOSIS — M5416 Radiculopathy, lumbar region: Secondary | ICD-10-CM

## 2014-05-04 ENCOUNTER — Ambulatory Visit
Admission: RE | Admit: 2014-05-04 | Discharge: 2014-05-04 | Disposition: A | Discharge status: Home / Self Care | Payer: MEDICARE | Source: Ambulatory Visit | Attending: Neurosurgery | Admitting: Neurosurgery

## 2014-05-04 DIAGNOSIS — M5416 Radiculopathy, lumbar region: Secondary | ICD-10-CM

## 2014-05-04 MED ORDER — GADOBENATE DIMEGLUMINE 529 MG/ML IV SOLN
20.0000 mL | Freq: Once | INTRAVENOUS | Status: AC | PRN
  Administered 2014-05-04: 20 mL via INTRAVENOUS

## 2014-05-09 ENCOUNTER — Other Ambulatory Visit: Payer: Self-pay | Admitting: Neurosurgery

## 2014-05-13 ENCOUNTER — Other Ambulatory Visit: Payer: MEDICARE

## 2014-05-15 ENCOUNTER — Encounter (HOSPITAL_COMMUNITY): Payer: Self-pay | Admitting: Pharmacy Technician

## 2014-05-18 NOTE — Pre-Procedure Instructions (Signed)
[facility] - Preparing for Surgery  Before surgery, you can play an important role.  Because skin is not sterile, your skin needs to be as free of germs as possible.  You can reduce the number of germs on you skin by washing with CHG (chlorahexidine gluconate) soap before surgery.  CHG is an antiseptic cleaner which kills germs and bonds with the skin to continue killing germs even after washing.  Please DO NOT use if you have an allergy to CHG or antibacterial soaps.  If your skin becomes reddened/irritated stop using the CHG and inform your nurse when you arrive at Short Stay.  Do not shave (including legs and underarms) for at least 48 hours prior to the first CHG shower.  You may shave your face.  Please follow these instructions carefully:   1.  Shower with CHG Soap the night before surgery and the morning of Surgery.  2.  If you choose to wash your hair, wash your hair first as usual with your normal shampoo.  3.  After you shampoo, rinse your hair and body thoroughly to remove the shampoo.  4.  Use CHG as you would any other liquid soap.  You can apply CHG directly to the skin and wash gently with scrungie or a clean washcloth.  5.  Apply the CHG Soap to your body ONLY FROM THE NECK DOWN.  Do not use on open wounds or open sores.  Avoid contact with your eyes, ears, mouth and genitals (private parts).  Wash genitals (private parts) with your normal soap.  6.  Wash thoroughly, paying special attention to the area where your surgery will be performed.  7.  Thoroughly rinse your body with warm water from the neck down.  8.  DO NOT shower/wash with your normal soap after using and rinsing off the CHG Soap.  9.  Pat yourself dry with a clean towel.            10.  Wear clean pajamas.            11.  Place clean sheets on your bed the night of your first shower and do not sleep with pets.  Day of Surgery  Do not apply any lotions the morning of surgery.  Please wear clean clothes to the  hospital/surgery center.   

## 2014-05-18 NOTE — Pre-Procedure Instructions (Signed)
Anthony [name]  05/18/2014   Your procedure is scheduled on:  June 2  Report to [facility] Admitting at 07:00 AM.  Call this number if you have problems the morning of surgery: [phone]   Remember:   Do not eat food or drink liquids after midnight.   Take these medicines the morning of surgery with A SIP OF WATER: Albuterol, Bisoprolol, Doxycyline, Flonase, Hydrocodone (if needed), Isosorbide, Spiriva   STOP Multiple Vitamin, Tumeric, L-Lysine, Garlic, Fish Oil, Cinnamon, Vitamin B complex, Aspirin today   Do not wear jewelry, make-up or nail polish.  Do not wear lotions, powders, or perfumes. You may wear deodorant.  Do not shave 48 hours prior to surgery. Men may shave face and neck.  Do not bring valuables to the hospital.  [facility] is not responsible for any belongings or valuables.               Contacts, dentures or bridgework may not be worn into surgery.  Leave suitcase in the car. After surgery it may be brought to your room.  For patients admitted to the hospital, discharge time is determined by your treatment team.               Special Instructions: See [facility] Preparing For Surgery   Please read over the following fact sheets that you were given: Pain Booklet, Coughing and Deep Breathing, Blood Transfusion Information and Surgical Site Infection Prevention

## 2014-05-21 ENCOUNTER — Encounter (HOSPITAL_COMMUNITY): Payer: Self-pay

## 2014-05-21 ENCOUNTER — Ambulatory Visit (HOSPITAL_COMMUNITY)
Admission: RE | Admit: 2014-05-21 | Discharge: 2014-05-21 | Disposition: A | Discharge status: Home / Self Care | Payer: MEDICARE | Source: Ambulatory Visit | Attending: Neurosurgery | Admitting: Neurosurgery

## 2014-05-21 ENCOUNTER — Encounter (HOSPITAL_COMMUNITY)
Admission: RE | Admit: 2014-05-21 | Discharge: 2014-05-21 | Disposition: A | Discharge status: Home / Self Care | Payer: MEDICARE | Source: Ambulatory Visit | Attending: Neurosurgery | Admitting: Neurosurgery

## 2014-05-21 DIAGNOSIS — Z01812 Encounter for preprocedural laboratory examination: Secondary | ICD-10-CM | POA: Insufficient documentation

## 2014-05-21 DIAGNOSIS — Z01818 Encounter for other preprocedural examination: Secondary | ICD-10-CM | POA: Insufficient documentation

## 2014-05-21 LAB — CBC
HCT: 44.8 % (ref 39.0–52.0)
Hemoglobin: 15.1 g/dL (ref 13.0–17.0)
MCH: 30.9 pg (ref 26.0–34.0)
MCHC: 33.7 g/dL (ref 30.0–36.0)
MCV: 91.6 fL (ref 78.0–100.0)
Platelets: 149 10*3/uL — ABNORMAL LOW (ref 150–400)
RBC: 4.89 MIL/uL (ref 4.22–5.81)
RDW: 12.8 % (ref 11.5–15.5)
WBC: 7.3 10*3/uL (ref 4.0–10.5)

## 2014-05-21 LAB — BASIC METABOLIC PANEL
BUN: 29 mg/dL — ABNORMAL HIGH (ref 6–23)
CO2: 27 mEq/L (ref 19–32)
Calcium: 9.8 mg/dL (ref 8.4–10.5)
Chloride: 102 mEq/L (ref 96–112)
Creatinine, Ser: 1.19 mg/dL (ref 0.50–1.35)
GFR calc Af Amer: 70 mL/min — ABNORMAL LOW (ref >90)
GFR calc non Af Amer: 61 mL/min — ABNORMAL LOW (ref >90)
Glucose, Bld: 154 mg/dL — ABNORMAL HIGH (ref 70–99)
Potassium: 4.9 mEq/L (ref 3.7–5.3)
Sodium: 140 mEq/L (ref 137–147)

## 2014-05-21 LAB — TYPE AND SCREEN
ABO/RH(D): O POS
Antibody Screen: NEGATIVE

## 2014-05-21 LAB — ABO/RH: ABO/RH(D): O POS

## 2014-05-21 LAB — SURGICAL PCR SCREEN
MRSA, PCR: NEGATIVE
Staphylococcus aureus: NEGATIVE

## 2014-05-21 NOTE — Pre-Procedure Instructions (Signed)
Anthony [name]  05/21/2014   Your procedure is scheduled on:  June 2  Report to [facility] Admitting at 07:00 AM.  Call this number if you have problems the morning of surgery: [phone]   Remember:   Do not eat food or drink liquids after midnight.   Take these medicines the morning of surgery with A SIP OF WATER: Albuterol, Bisoprolol, Doxycyline, Flonase, Hydrocodone (if needed), Isosorbide, Spiriva   STOP Multiple Vitamin, Tumeric, L-Lysine, Garlic, Fish Oil, Cinnamon, Vitamin B complex, Aspirin today   Do not wear jewelry, make-up or nail polish.  Do not wear lotions, powders, or perfumes. You may wear deodorant.  Do not shave 48 hours prior to surgery. Men may shave face and neck.  Do not bring valuables to the hospital.  [facility] is not responsible for any belongings or valuables.               Contacts, dentures or bridgework may not be worn into surgery.  Leave suitcase in the car. After surgery it may be brought to your room.  For patients admitted to the hospital, discharge time is determined by your treatment team.               Special Instructions: See [facility] Preparing For Surgery   Please read over the following fact sheets that you were given: Pain Booklet, Coughing and Deep Breathing, Blood Transfusion Information and Surgical Site Infection Prevention

## 2014-05-22 NOTE — Progress Notes (Signed)
Anesthesia Chart Review:  Patient is a 68 year old male scheduled for right L2-3, L4-5, L3-4, possible L1-2 ALIF on 05/28/14 by Dr. Vertell Limber.  History includes former smoker, CAD s/p stent '01, DM2, HLD, CHF, COPD, right hemidiaphragm paralysis, chronic respiratory failure with hypoxia '13 (now off supplemental O2 per 05/01/14 cardiology notes), PVD/claudication (Dr. [name]), OSA with CPAP use, tonsillectomy, cervical fusion '02, lumbar surgery '05. BMI is consistent with obesity. PCP is Dr. [name]. Pulmonologist is Dr. Halford Chessman, last visit 11/29/13. Cardiologist is Dr. [name], last visit 05/01/14. Patient is the father-in-law to interventional radiologist Dr. [name].  EKG on 09/27/13 showed NSR, low voltage QRS, cannot rule out anterior infarct (age undetermined).  Nuclear stress test on 10/18/13 showed: Overall Impression: Normal stress nuclear study. LV Ejection Fraction: 56%. LV Wall Motion: NL LV Function; NL Wall Motion.  Echo on 10/09/12 showed: - Left ventricle: The cavity size was normal. Wall thickness was increased in a pattern of mild LVH. Systolic function was normal. The estimated ejection fraction was in the range of 50% to 55%. Doppler parameters are consistent with abnormal left ventricular relaxation (grade 1 diastolic dysfunction). - Left atrium: The atrium was mildly dilated. - Pulmonary arteries: PA peak pressure: 42mm Hg (S). - Mitral valve: Trivial regurgitation. - Tricuspid valve: Mild regurgitation.  According to Dr. Clayborne Dana 05/01/14 note, patient had a cardiac cath in 2001 in [place] and was told "he had 70% blockage in one artery and one in the back was totally blocker.  Eventually underwent stenting of the 70%. No caths since."  CXR on 05/21/14 showed: Chronic elevation of the right hemidiaphragm. No superimposed acute abnormality  According to Dr. Juanetta Gosling last office note: PFT 08/13/12>>FEV1 1.83 (62%), FEV1% 64, TLC 5.17 (82%), DLCO 67%, +BD  SNIFF test  08/14/12>>Paradoxical motion of the right hemidiaphragm with inspiration.  PSG 08/14/12>>AHI 38.5. CPAP 13 cm H2O with 1 liter oxygen.  Echo 10/09/12>>mild LVH, EF 50 to 24%, grade 1 diastolic dysfx, mild LA dilation, PAS 35 mmHg  CPAP 09/26/12 to 10/25/12>>Used on 30 of 30 nights with average 8 hrs 48 min. Average AHI 0.6 with CPAP 13 cm H2O.  ONO with CPAP and RA 01/18/13 >> test time 10 hrs 37 min. Mean SpO2 94%, low SpO2 86%. Spent 16 sec with SpO2 < 88%.  Preoperative labs noted.    Patient with recent cardiology follow-up and was felt stable at that time. He had a normal stress test less than one year ago. Dr. [name] referred patient to Dr. Vertell Limber.  Pulmonology also saw him within the past six month.  There were no new CXR findings.  Further evaluation by his assigned anesthesiologist on the day of surgery, but if no acute cardiopulmonary symptoms then I would anticipate that he could proceed as planned.  Anesthesiologist Dr. Lissa Hoard agrees with this plan.  [facility] Short Stay Center/Anesthesiology Phone [phone] 05/22/2014 4:39 PM

## 2014-05-27 MED ORDER — DEXTROSE 5 % IV SOLN
3.0000 g | INTRAVENOUS | Status: AC
  Administered 2014-05-28 (×2): 3 g via INTRAVENOUS
  Filled 2014-05-27: qty 3000

## 2014-05-28 ENCOUNTER — Encounter (HOSPITAL_COMMUNITY): Payer: Self-pay | Admitting: Surgery

## 2014-05-28 ENCOUNTER — Inpatient Hospital Stay (HOSPITAL_COMMUNITY)
Admission: RE | Admit: 2014-05-28 | Discharge: 2014-05-30 | DRG: 458 | Disposition: A | Discharge status: Home / Self Care | Payer: MEDICARE | Source: Ambulatory Visit | Attending: Neurosurgery | Admitting: Neurosurgery

## 2014-05-28 ENCOUNTER — Encounter (HOSPITAL_COMMUNITY)
Admission: RE | Disposition: A | Discharge status: Home / Self Care | Payer: Self-pay | Source: Ambulatory Visit | Attending: Neurosurgery

## 2014-05-28 ENCOUNTER — Inpatient Hospital Stay (HOSPITAL_COMMUNITY): Payer: MEDICARE | Admitting: Anesthesiology

## 2014-05-28 ENCOUNTER — Inpatient Hospital Stay (HOSPITAL_COMMUNITY): Payer: MEDICARE

## 2014-05-28 ENCOUNTER — Encounter (HOSPITAL_COMMUNITY): Payer: MEDICARE | Admitting: Vascular Surgery

## 2014-05-28 DIAGNOSIS — M47817 Spondylosis without myelopathy or radiculopathy, lumbosacral region: Secondary | ICD-10-CM | POA: Diagnosis not present

## 2014-05-28 DIAGNOSIS — M5137 Other intervertebral disc degeneration, lumbosacral region: Secondary | ICD-10-CM | POA: Diagnosis present

## 2014-05-28 DIAGNOSIS — J449 Chronic obstructive pulmonary disease, unspecified: Secondary | ICD-10-CM | POA: Diagnosis not present

## 2014-05-28 DIAGNOSIS — E119 Type 2 diabetes mellitus without complications: Secondary | ICD-10-CM | POA: Diagnosis present

## 2014-05-28 DIAGNOSIS — I509 Heart failure, unspecified: Secondary | ICD-10-CM | POA: Diagnosis present

## 2014-05-28 DIAGNOSIS — I10 Essential (primary) hypertension: Secondary | ICD-10-CM | POA: Diagnosis present

## 2014-05-28 DIAGNOSIS — M4120 Other idiopathic scoliosis, site unspecified: Principal | ICD-10-CM | POA: Diagnosis present

## 2014-05-28 DIAGNOSIS — M51379 Other intervertebral disc degeneration, lumbosacral region without mention of lumbar back pain or lower extremity pain: Secondary | ICD-10-CM | POA: Diagnosis present

## 2014-05-28 DIAGNOSIS — M419 Scoliosis, unspecified: Secondary | ICD-10-CM | POA: Diagnosis present

## 2014-05-28 DIAGNOSIS — J4489 Other specified chronic obstructive pulmonary disease: Secondary | ICD-10-CM | POA: Diagnosis present

## 2014-05-28 HISTORY — PX: ANTERIOR LATERAL LUMBAR FUSION 4 LEVELS: SHX5552

## 2014-05-28 HISTORY — PX: LUMBAR PERCUTANEOUS PEDICLE SCREW 4 LEVEL: SHX6318

## 2014-05-28 LAB — GLUCOSE, CAPILLARY
Glucose-Capillary: 154 mg/dL — ABNORMAL HIGH (ref 70–99)
Glucose-Capillary: 131 mg/dL — ABNORMAL HIGH (ref 70–99)
Glucose-Capillary: 152 mg/dL — ABNORMAL HIGH (ref 70–99)
Glucose-Capillary: 184 mg/dL — ABNORMAL HIGH (ref 70–99)

## 2014-05-28 SURGERY — ANTERIOR LATERAL LUMBAR FUSION 4 LEVELS
Anesthesia: General | Site: Back | Laterality: Right

## 2014-05-28 MED ORDER — OXYCODONE HCL 5 MG PO TABS: 5.0000 mg | ORAL_TABLET | Freq: Once | ORAL | Status: DC | PRN

## 2014-05-28 MED ORDER — SIMVASTATIN 40 MG PO TABS
40.0000 mg | ORAL_TABLET | Freq: Every evening | ORAL | Status: DC
  Administered 2014-05-28 – 2014-05-29 (×2): 40 mg via ORAL
  Filled 2014-05-28 (×3): qty 1

## 2014-05-28 MED ORDER — PANTOPRAZOLE SODIUM 40 MG IV SOLR
40.0000 mg | Freq: Every day | INTRAVENOUS | Status: DC
  Administered 2014-05-28: 40 mg via INTRAVENOUS
  Filled 2014-05-28 (×2): qty 40

## 2014-05-28 MED ORDER — CEFAZOLIN SODIUM 1-5 GM-% IV SOLN
INTRAVENOUS | Status: AC
  Filled 2014-05-28: qty 50

## 2014-05-28 MED ORDER — PROPOFOL 10 MG/ML IV BOLUS
INTRAVENOUS | Status: DC | PRN
Start: 2014-05-28 — End: 2014-05-28
  Administered 2014-05-28: 160 mg via INTRAVENOUS

## 2014-05-28 MED ORDER — SUCCINYLCHOLINE CHLORIDE 20 MG/ML IJ SOLN
INTRAMUSCULAR | Status: DC | PRN
  Administered 2014-05-28: 100 mg via INTRAVENOUS

## 2014-05-28 MED ORDER — FENTANYL CITRATE 0.05 MG/ML IJ SOLN
INTRAMUSCULAR | Status: DC | PRN
  Administered 2014-05-28 (×2): 50 ug via INTRAVENOUS
  Administered 2014-05-28: 200 ug via INTRAVENOUS
  Administered 2014-05-28 (×4): 50 ug via INTRAVENOUS
  Administered 2014-05-28: 25 ug via INTRAVENOUS
  Administered 2014-05-28: 100 ug via INTRAVENOUS

## 2014-05-28 MED ORDER — BISOPROLOL FUMARATE 5 MG PO TABS
5.0000 mg | ORAL_TABLET | Freq: Every day | ORAL | Status: DC
  Administered 2014-05-29 – 2014-05-30 (×2): 5 mg via ORAL
  Filled 2014-05-28 (×2): qty 1

## 2014-05-28 MED ORDER — LIDOCAINE-EPINEPHRINE 1 %-1:100000 IJ SOLN
INTRAMUSCULAR | Status: DC | PRN
  Administered 2014-05-28: 22 mL

## 2014-05-28 MED ORDER — KCL IN DEXTROSE-NACL 20-5-0.45 MEQ/L-%-% IV SOLN
INTRAVENOUS | Status: DC
  Administered 2014-05-28 (×2): via INTRAVENOUS
  Filled 2014-05-28 (×4): qty 1000

## 2014-05-28 MED ORDER — ASPIRIN 325 MG PO TABS
325.0000 mg | ORAL_TABLET | Freq: Every day | ORAL | Status: DC
  Administered 2014-05-29 – 2014-05-30 (×2): 325 mg via ORAL
  Filled 2014-05-28 (×2): qty 1

## 2014-05-28 MED ORDER — 0.9 % SODIUM CHLORIDE (POUR BTL) OPTIME
TOPICAL | Status: DC | PRN
  Administered 2014-05-28: 1000 mL

## 2014-05-28 MED ORDER — ALBUTEROL SULFATE (2.5 MG/3ML) 0.083% IN NEBU: 2.5000 mg | INHALATION_SOLUTION | Freq: Four times a day (QID) | RESPIRATORY_TRACT | Status: DC | PRN

## 2014-05-28 MED ORDER — FENTANYL CITRATE 0.05 MG/ML IJ SOLN
INTRAMUSCULAR | Status: AC
  Filled 2014-05-28: qty 5

## 2014-05-28 MED ORDER — ALBUMIN HUMAN 5 % IV SOLN
INTRAVENOUS | Status: DC | PRN
  Administered 2014-05-28: 15:00:00 via INTRAVENOUS

## 2014-05-28 MED ORDER — ONDANSETRON HCL 4 MG/2ML IJ SOLN: 4.0000 mg | INTRAMUSCULAR | Status: DC | PRN

## 2014-05-28 MED ORDER — ONDANSETRON HCL 4 MG/2ML IJ SOLN
INTRAMUSCULAR | Status: AC
  Filled 2014-05-28: qty 2

## 2014-05-28 MED ORDER — METFORMIN HCL 500 MG PO TABS
1000.0000 mg | ORAL_TABLET | Freq: Two times a day (BID) | ORAL | Status: DC
  Administered 2014-05-29 – 2014-05-30 (×3): 1000 mg via ORAL
  Filled 2014-05-28 (×5): qty 2

## 2014-05-28 MED ORDER — DOXYCYCLINE HYCLATE 100 MG PO TABS
50.0000 mg | ORAL_TABLET | Freq: Every day | ORAL | Status: DC
  Administered 2014-05-28: 50 mg via ORAL
  Administered 2014-05-29: 10:00:00 via ORAL
  Administered 2014-05-30: 50 mg via ORAL
  Filled 2014-05-28 (×3): qty 0.5

## 2014-05-28 MED ORDER — SODIUM CHLORIDE 0.9 % IJ SOLN: 3.0000 mL | INTRAMUSCULAR | Status: DC | PRN

## 2014-05-28 MED ORDER — ISOSORBIDE MONONITRATE ER 30 MG PO TB24
30.0000 mg | ORAL_TABLET | Freq: Every day | ORAL | Status: DC
  Administered 2014-05-29 – 2014-05-30 (×2): 30 mg via ORAL
  Filled 2014-05-28 (×2): qty 1

## 2014-05-28 MED ORDER — OXYCODONE HCL 5 MG/5ML PO SOLN: 5.0000 mg | Freq: Once | ORAL | Status: DC | PRN

## 2014-05-28 MED ORDER — PROPOFOL 10 MG/ML IV BOLUS
INTRAVENOUS | Status: AC
  Filled 2014-05-28: qty 20

## 2014-05-28 MED ORDER — BUPIVACAINE HCL (PF) 0.5 % IJ SOLN
INTRAMUSCULAR | Status: DC | PRN
  Administered 2014-05-28: 22 mL

## 2014-05-28 MED ORDER — EPHEDRINE SULFATE 50 MG/ML IJ SOLN
INTRAMUSCULAR | Status: DC | PRN
  Administered 2014-05-28 (×5): 10 mg via INTRAVENOUS

## 2014-05-28 MED ORDER — PROMETHAZINE HCL 25 MG/ML IJ SOLN: 6.2500 mg | INTRAMUSCULAR | Status: DC | PRN

## 2014-05-28 MED ORDER — HYDROCODONE-ACETAMINOPHEN 10-325 MG PO TABS: 1.0000 | ORAL_TABLET | Freq: Four times a day (QID) | ORAL | Status: DC | PRN

## 2014-05-28 MED ORDER — TIOTROPIUM BROMIDE MONOHYDRATE 18 MCG IN CAPS
18.0000 ug | ORAL_CAPSULE | RESPIRATORY_TRACT | Status: DC
  Administered 2014-05-30: 18 ug via RESPIRATORY_TRACT
  Filled 2014-05-28: qty 5

## 2014-05-28 MED ORDER — LIDOCAINE HCL (CARDIAC) 20 MG/ML IV SOLN
INTRAVENOUS | Status: AC
  Filled 2014-05-28: qty 5

## 2014-05-28 MED ORDER — ALUM & MAG HYDROXIDE-SIMETH 200-200-20 MG/5ML PO SUSP: 30.0000 mL | Freq: Four times a day (QID) | ORAL | Status: DC | PRN

## 2014-05-28 MED ORDER — MORPHINE SULFATE 2 MG/ML IJ SOLN
1.0000 mg | INTRAMUSCULAR | Status: DC | PRN
  Administered 2014-05-28: 2 mg via INTRAVENOUS
  Filled 2014-05-28: qty 1

## 2014-05-28 MED ORDER — L-LYSINE 1000 MG PO TABS: 1000.0000 mg | ORAL_TABLET | Freq: Every day | ORAL | Status: DC

## 2014-05-28 MED ORDER — ACETAMINOPHEN 650 MG RE SUPP: 650.0000 mg | RECTAL | Status: DC | PRN

## 2014-05-28 MED ORDER — PENTOXIFYLLINE ER 400 MG PO TBCR
400.0000 mg | EXTENDED_RELEASE_TABLET | Freq: Two times a day (BID) | ORAL | Status: DC
  Administered 2014-05-29 – 2014-05-30 (×3): 400 mg via ORAL
  Filled 2014-05-28 (×5): qty 1

## 2014-05-28 MED ORDER — INSULIN ASPART 100 UNIT/ML ~~LOC~~ SOLN: 0.0000 [IU] | Freq: Every day | SUBCUTANEOUS | Status: DC

## 2014-05-28 MED ORDER — THROMBIN 5000 UNITS EX SOLR
CUTANEOUS | Status: DC | PRN
  Administered 2014-05-28 (×2): 5000 [IU] via TOPICAL

## 2014-05-28 MED ORDER — LOSARTAN POTASSIUM 50 MG PO TABS
100.0000 mg | ORAL_TABLET | Freq: Every day | ORAL | Status: DC
  Administered 2014-05-28 – 2014-05-30 (×3): 100 mg via ORAL
  Filled 2014-05-28 (×3): qty 2

## 2014-05-28 MED ORDER — ROCURONIUM BROMIDE 50 MG/5ML IV SOLN
INTRAVENOUS | Status: AC
  Filled 2014-05-28: qty 1

## 2014-05-28 MED ORDER — INSULIN ASPART 100 UNIT/ML ~~LOC~~ SOLN
4.0000 [IU] | Freq: Three times a day (TID) | SUBCUTANEOUS | Status: DC
  Administered 2014-05-29 – 2014-05-30 (×2): 4 [IU] via SUBCUTANEOUS

## 2014-05-28 MED ORDER — SODIUM CHLORIDE 0.9 % IJ SOLN
3.0000 mL | Freq: Two times a day (BID) | INTRAMUSCULAR | Status: DC
  Administered 2014-05-28 – 2014-05-29 (×2): 3 mL via INTRAVENOUS

## 2014-05-28 MED ORDER — LIDOCAINE HCL (CARDIAC) 20 MG/ML IV SOLN
INTRAVENOUS | Status: DC | PRN
  Administered 2014-05-28: 80 mg via INTRAVENOUS

## 2014-05-28 MED ORDER — MENTHOL 3 MG MT LOZG: 1.0000 | LOZENGE | OROMUCOSAL | Status: DC | PRN

## 2014-05-28 MED ORDER — FLUTICASONE PROPIONATE 50 MCG/ACT NA SUSP
2.0000 | Freq: Every evening | NASAL | Status: DC | PRN
  Filled 2014-05-28: qty 16

## 2014-05-28 MED ORDER — DEXTROSE 5 % IV SOLN
10.0000 mg | INTRAVENOUS | Status: DC | PRN
  Administered 2014-05-28: 20 ug/min via INTRAVENOUS

## 2014-05-28 MED ORDER — ONE-DAILY MULTI VITAMINS PO TABS: 1.0000 | ORAL_TABLET | Freq: Every day | ORAL | Status: DC

## 2014-05-28 MED ORDER — LACTATED RINGERS IV SOLN
INTRAVENOUS | Status: DC
  Administered 2014-05-28: 08:00:00 via INTRAVENOUS

## 2014-05-28 MED ORDER — ARTIFICIAL TEARS OP OINT
TOPICAL_OINTMENT | OPHTHALMIC | Status: DC | PRN
  Administered 2014-05-28: 1 via OPHTHALMIC

## 2014-05-28 MED ORDER — DOCUSATE SODIUM 100 MG PO CAPS
100.0000 mg | ORAL_CAPSULE | Freq: Two times a day (BID) | ORAL | Status: DC
  Administered 2014-05-28 – 2014-05-30 (×4): 100 mg via ORAL
  Filled 2014-05-28 (×4): qty 1

## 2014-05-28 MED ORDER — CEFAZOLIN SODIUM-DEXTROSE 2-3 GM-% IV SOLR
INTRAVENOUS | Status: AC
  Filled 2014-05-28: qty 50

## 2014-05-28 MED ORDER — ACETAMINOPHEN 325 MG PO TABS: 325.0000 mg | ORAL_TABLET | ORAL | Status: DC | PRN

## 2014-05-28 MED ORDER — CEFAZOLIN SODIUM 1-5 GM-% IV SOLN
1.0000 g | Freq: Three times a day (TID) | INTRAVENOUS | Status: AC
  Administered 2014-05-28 – 2014-05-29 (×2): 1 g via INTRAVENOUS
  Filled 2014-05-28 (×2): qty 50

## 2014-05-28 MED ORDER — DOXYCYCLINE HYCLATE 50 MG PO CAPS
50.0000 mg | ORAL_CAPSULE | Freq: Every day | ORAL | Status: DC
  Filled 2014-05-28: qty 1

## 2014-05-28 MED ORDER — HYDROMORPHONE HCL PF 1 MG/ML IJ SOLN
0.2500 mg | INTRAMUSCULAR | Status: DC | PRN
  Administered 2014-05-28 (×3): 0.5 mg via INTRAVENOUS

## 2014-05-28 MED ORDER — INSULIN ASPART 100 UNIT/ML ~~LOC~~ SOLN
0.0000 [IU] | Freq: Three times a day (TID) | SUBCUTANEOUS | Status: DC
  Administered 2014-05-29: 5 [IU] via SUBCUTANEOUS
  Administered 2014-05-30: 3 [IU] via SUBCUTANEOUS

## 2014-05-28 MED ORDER — GLYCOPYRROLATE 0.2 MG/ML IJ SOLN
INTRAMUSCULAR | Status: DC | PRN
  Administered 2014-05-28: 0.2 mg via INTRAVENOUS

## 2014-05-28 MED ORDER — HEMOSTATIC AGENTS (NO CHARGE) OPTIME
TOPICAL | Status: DC | PRN
  Administered 2014-05-28: 1 via TOPICAL

## 2014-05-28 MED ORDER — DOXYCYCLINE MONOHYDRATE 50 MG PO TABS: 50.0000 mg | ORAL_TABLET | Freq: Every day | ORAL | Status: DC

## 2014-05-28 MED ORDER — MIDAZOLAM HCL 5 MG/5ML IJ SOLN
INTRAMUSCULAR | Status: DC | PRN
  Administered 2014-05-28: 2 mg via INTRAVENOUS

## 2014-05-28 MED ORDER — GLIPIZIDE ER 5 MG PO TB24
5.0000 mg | ORAL_TABLET | Freq: Every day | ORAL | Status: DC
  Administered 2014-05-28 – 2014-05-30 (×3): 5 mg via ORAL
  Filled 2014-05-28 (×3): qty 1

## 2014-05-28 MED ORDER — ACETAMINOPHEN 160 MG/5ML PO SOLN: 325.0000 mg | ORAL | Status: DC | PRN

## 2014-05-28 MED ORDER — DIAZEPAM 5 MG PO TABS
ORAL_TABLET | ORAL | Status: AC
  Administered 2014-05-28: 5 mg via ORAL
  Filled 2014-05-28: qty 1

## 2014-05-28 MED ORDER — FLEET ENEMA 7-19 GM/118ML RE ENEM: 1.0000 | ENEMA | Freq: Once | RECTAL | Status: AC | PRN

## 2014-05-28 MED ORDER — ONDANSETRON HCL 4 MG/2ML IJ SOLN
INTRAMUSCULAR | Status: DC | PRN
  Administered 2014-05-28: 4 mg via INTRAVENOUS

## 2014-05-28 MED ORDER — PHENOL 1.4 % MT LIQD: 1.0000 | OROMUCOSAL | Status: DC | PRN

## 2014-05-28 MED ORDER — HYDROMORPHONE HCL PF 1 MG/ML IJ SOLN
INTRAMUSCULAR | Status: AC
  Administered 2014-05-28: 0.5 mg via INTRAVENOUS
  Filled 2014-05-28: qty 1

## 2014-05-28 MED ORDER — DOXAZOSIN MESYLATE 2 MG PO TABS
2.0000 mg | ORAL_TABLET | Freq: Every day | ORAL | Status: DC
  Administered 2014-05-28 – 2014-05-29 (×2): 2 mg via ORAL
  Filled 2014-05-28 (×3): qty 1

## 2014-05-28 MED ORDER — ROPINIROLE HCL 1 MG PO TABS
1.0000 mg | ORAL_TABLET | Freq: Every day | ORAL | Status: DC
  Administered 2014-05-28 – 2014-05-29 (×2): 1 mg via ORAL
  Filled 2014-05-28 (×3): qty 1

## 2014-05-28 MED ORDER — LACTATED RINGERS IV SOLN
INTRAVENOUS | Status: DC | PRN
  Administered 2014-05-28 (×3): via INTRAVENOUS

## 2014-05-28 MED ORDER — OXYCODONE-ACETAMINOPHEN 5-325 MG PO TABS
1.0000 | ORAL_TABLET | ORAL | Status: DC | PRN
  Administered 2014-05-28: 1 via ORAL
  Administered 2014-05-29: 2 via ORAL
  Filled 2014-05-28 (×2): qty 2

## 2014-05-28 MED ORDER — HYDROMORPHONE HCL PF 1 MG/ML IJ SOLN
INTRAMUSCULAR | Status: AC
  Filled 2014-05-28: qty 1

## 2014-05-28 MED ORDER — SODIUM CHLORIDE 0.9 % IV SOLN: 250.0000 mL | INTRAVENOUS | Status: DC

## 2014-05-28 MED ORDER — FUROSEMIDE 20 MG PO TABS
20.0000 mg | ORAL_TABLET | Freq: Every day | ORAL | Status: DC
  Administered 2014-05-28 – 2014-05-30 (×3): 20 mg via ORAL
  Filled 2014-05-28 (×3): qty 1

## 2014-05-28 MED ORDER — POTASSIUM CHLORIDE CRYS ER 20 MEQ PO TBCR
20.0000 meq | EXTENDED_RELEASE_TABLET | Freq: Two times a day (BID) | ORAL | Status: DC
  Administered 2014-05-28 – 2014-05-30 (×4): 20 meq via ORAL
  Filled 2014-05-28 (×5): qty 1

## 2014-05-28 MED ORDER — SENNA 8.6 MG PO TABS
1.0000 | ORAL_TABLET | Freq: Two times a day (BID) | ORAL | Status: DC
Start: 2014-05-28 — End: 2014-05-30
  Administered 2014-05-28 – 2014-05-30 (×4): 8.6 mg via ORAL
  Filled 2014-05-28 (×4): qty 1

## 2014-05-28 MED ORDER — HYDROCODONE-ACETAMINOPHEN 5-325 MG PO TABS
1.0000 | ORAL_TABLET | ORAL | Status: DC | PRN
  Administered 2014-05-29 – 2014-05-30 (×6): 2 via ORAL
  Filled 2014-05-28 (×6): qty 2

## 2014-05-28 MED ORDER — MIDAZOLAM HCL 2 MG/2ML IJ SOLN
INTRAMUSCULAR | Status: AC
  Filled 2014-05-28: qty 2

## 2014-05-28 MED ORDER — BISACODYL 10 MG RE SUPP: 10.0000 mg | Freq: Every day | RECTAL | Status: DC | PRN

## 2014-05-28 MED ORDER — ACETAMINOPHEN 325 MG PO TABS: 650.0000 mg | ORAL_TABLET | ORAL | Status: DC | PRN

## 2014-05-28 MED ORDER — DIAZEPAM 5 MG PO TABS
5.0000 mg | ORAL_TABLET | Freq: Four times a day (QID) | ORAL | Status: DC | PRN
  Administered 2014-05-28 – 2014-05-30 (×4): 5 mg via ORAL
  Filled 2014-05-28 (×3): qty 1

## 2014-05-28 MED ORDER — ADULT MULTIVITAMIN W/MINERALS CH
1.0000 | ORAL_TABLET | Freq: Every day | ORAL | Status: DC
  Administered 2014-05-28 – 2014-05-30 (×3): 1 via ORAL
  Filled 2014-05-28 (×3): qty 1

## 2014-05-28 MED ORDER — SENNOSIDES-DOCUSATE SODIUM 8.6-50 MG PO TABS: 1.0000 | ORAL_TABLET | Freq: Every evening | ORAL | Status: DC | PRN

## 2014-05-28 SURGICAL SUPPLY — 82 items
BAG DECANTER FOR FLEXI CONT (MISCELLANEOUS) ×6 IMPLANT
BENZOIN TINCTURE PRP APPL 2/3 (GAUZE/BANDAGES/DRESSINGS) ×3 IMPLANT
BLADE 10 SAFETY STRL DISP (BLADE) ×6 IMPLANT
BLADE SURG ROTATE 9660 (MISCELLANEOUS) IMPLANT
BONE MATRIX OSTEOCEL PRO MED (Bone Implant) ×12 IMPLANT
CAGE COROENT XLW 10X22X50-15 (Cage) ×3 IMPLANT
CAGE COROENT XLW 10X22X60-15 (Cage) ×6 IMPLANT
CLIP NEUROVISION LG (CLIP) ×3 IMPLANT
CONT SPEC 4OZ CLIKSEAL STRL BL (MISCELLANEOUS) ×6 IMPLANT
COVER BACK TABLE 24X17X13 BIG (DRAPES) IMPLANT
COVER TABLE BACK 60X90 (DRAPES) ×3 IMPLANT
DERMABOND ADHESIVE PROPEN (GAUZE/BANDAGES/DRESSINGS) ×1
DERMABOND ADVANCED (GAUZE/BANDAGES/DRESSINGS) ×1
DERMABOND ADVANCED .7 DNX12 (GAUZE/BANDAGES/DRESSINGS) ×2 IMPLANT
DERMABOND ADVANCED .7 DNX6 (GAUZE/BANDAGES/DRESSINGS) ×2 IMPLANT
DIGITIZER BENDINI (MISCELLANEOUS) ×3 IMPLANT
DRAPE C-ARM 42X72 X-RAY (DRAPES) ×9 IMPLANT
DRAPE C-ARMOR (DRAPES) ×6 IMPLANT
DRAPE LAPAROTOMY 100X72X124 (DRAPES) ×6 IMPLANT
DRAPE POUCH INSTRU U-SHP 10X18 (DRAPES) ×6 IMPLANT
DRAPE SURG 17X23 STRL (DRAPES) ×3 IMPLANT
DRESSING TELFA 8X3 (GAUZE/BANDAGES/DRESSINGS) ×3 IMPLANT
DURAPREP 26ML APPLICATOR (WOUND CARE) ×9 IMPLANT
ELECT REM PT RETURN 9FT ADLT (ELECTROSURGICAL) ×6
ELECTRODE REM PT RTRN 9FT ADLT (ELECTROSURGICAL) ×4 IMPLANT
GAUZE SPONGE 4X4 16PLY XRAY LF (GAUZE/BANDAGES/DRESSINGS) ×6 IMPLANT
GLOVE BIO SURGEON STRL SZ8 (GLOVE) ×9 IMPLANT
GLOVE BIOGEL PI IND STRL 8 (GLOVE) ×8 IMPLANT
GLOVE BIOGEL PI IND STRL 8.5 (GLOVE) ×8 IMPLANT
GLOVE BIOGEL PI INDICATOR 8 (GLOVE) ×4
GLOVE BIOGEL PI INDICATOR 8.5 (GLOVE) ×4
GLOVE ECLIPSE 8.0 STRL XLNG CF (GLOVE) ×12 IMPLANT
GLOVE ECLIPSE 8.5 STRL (GLOVE) ×12 IMPLANT
GLOVE EXAM NITRILE LRG STRL (GLOVE) IMPLANT
GLOVE EXAM NITRILE MD LF STRL (GLOVE) IMPLANT
GLOVE EXAM NITRILE XL STR (GLOVE) IMPLANT
GLOVE EXAM NITRILE XS STR PU (GLOVE) IMPLANT
GOWN STRL REUS W/ TWL LRG LVL3 (GOWN DISPOSABLE) IMPLANT
GOWN STRL REUS W/ TWL XL LVL3 (GOWN DISPOSABLE) ×8 IMPLANT
GOWN STRL REUS W/TWL 2XL LVL3 (GOWN DISPOSABLE) ×9 IMPLANT
GOWN STRL REUS W/TWL LRG LVL3 (GOWN DISPOSABLE)
GOWN STRL REUS W/TWL XL LVL3 (GOWN DISPOSABLE) ×4
GUIDEWIRE NITINOL BEVEL TIP (WIRE) ×27 IMPLANT
INTERLOCK LORDOTIC 12X22X50 (Bone Implant) ×2 IMPLANT
KIT BASIN OR (CUSTOM PROCEDURE TRAY) ×6 IMPLANT
KIT DILATOR XLIF 5 (KITS) ×2 IMPLANT
KIT MAXCESS (KITS) ×3 IMPLANT
KIT POSITION SURG JACKSON T1 (MISCELLANEOUS) ×3 IMPLANT
KIT ROOM TURNOVER OR (KITS) ×6 IMPLANT
KIT XLIF (KITS) ×1
LORDOTIC 12X22X50 (Bone Implant) ×3 IMPLANT
MARKER SKIN DUAL TIP RULER LAB (MISCELLANEOUS) ×3 IMPLANT
NEEDLE HYPO 25X1 1.5 SAFETY (NEEDLE) ×6 IMPLANT
NEEDLE I PASS (NEEDLE) ×3 IMPLANT
NS IRRIG 1000ML POUR BTL (IV SOLUTION) ×6 IMPLANT
PACK LAMINECTOMY NEURO (CUSTOM PROCEDURE TRAY) ×6 IMPLANT
PAD ARMBOARD 7.5X6 YLW CONV (MISCELLANEOUS) ×9 IMPLANT
PATTIES SURGICAL .5 X.5 (GAUZE/BANDAGES/DRESSINGS) IMPLANT
PATTIES SURGICAL .5 X1 (DISPOSABLE) IMPLANT
PATTIES SURGICAL 1X1 (DISPOSABLE) IMPLANT
PROBE BALL TIP NVM5 SNG USE (BALLOONS) ×3 IMPLANT
ROD PRECEPT 300MM SPINE (Rod) ×6 IMPLANT
SCREW POLYAXIAL 6.5X50MM (Screw) ×9 IMPLANT
SCREW PRECEPT 6.5X45 (Screw) ×18 IMPLANT
SCREW PRECEPT POLY 7.5X45MM (Screw) ×3 IMPLANT
SCREW PRECEPT SET (Screw) ×27 IMPLANT
SPONGE GAUZE 4X4 12PLY (GAUZE/BANDAGES/DRESSINGS) ×3 IMPLANT
SPONGE LAP 4X18 X RAY DECT (DISPOSABLE) IMPLANT
SPONGE SURGIFOAM ABS GEL SZ50 (HEMOSTASIS) IMPLANT
STAPLER SKIN PROX WIDE 3.9 (STAPLE) ×3 IMPLANT
STRIP CLOSURE SKIN 1/2X4 (GAUZE/BANDAGES/DRESSINGS) ×3 IMPLANT
SUT VIC AB 1 CT1 18XBRD ANBCTR (SUTURE) ×8 IMPLANT
SUT VIC AB 1 CT1 8-18 (SUTURE) ×4
SUT VIC AB 2-0 CT1 18 (SUTURE) ×15 IMPLANT
SUT VIC AB 3-0 SH 8-18 (SUTURE) ×18 IMPLANT
SYR 20ML ECCENTRIC (SYRINGE) ×6 IMPLANT
SYR INSULIN 1ML 31GX6 SAFETY (SYRINGE) IMPLANT
TAPE CLOTH 3X10 TAN LF (GAUZE/BANDAGES/DRESSINGS) ×9 IMPLANT
TOWEL OR 17X24 6PK STRL BLUE (TOWEL DISPOSABLE) ×6 IMPLANT
TOWEL OR 17X26 10 PK STRL BLUE (TOWEL DISPOSABLE) ×6 IMPLANT
TRAY FOLEY CATH 14FRSI W/METER (CATHETERS) ×6 IMPLANT
WATER STERILE IRR 1000ML POUR (IV SOLUTION) ×6 IMPLANT

## 2014-05-28 NOTE — Interval H&P Note (Signed)
History and Physical Interval Note:  05/28/2014 7:43 AM  Anthony [name]  has presented today for surgery, with the diagnosis of Scoliosis, Lumbar degenerative disc disease, Lumbar spondylosis, Radiculopathy  The various methods of treatment have been discussed with the patient and family. After consideration of risks, benefits and other options for treatment, the patient has consented to  Procedure(s) with comments: Right L4-5 L3-4 L2-3 possibly L1-2 Anterior lateral lumbar fusion with percutaneaous pedicle screws (Right) - Right L4-5 L3-4 L2-3 possibly L1-2 Anterior lateral lumbar fusion with percutaneaous pedicle screws LUMBAR PERCUTANEOUS PEDICLE SCREW 4 LEVEL (N/A) as a surgical intervention .  The patient's history has been reviewed, patient examined, no change in status, stable for surgery.  I have reviewed the patient's chart and labs.  Questions were answered to the patient's satisfaction.     Anthony [name]

## 2014-05-28 NOTE — Transfer of Care (Signed)
Immediate Anesthesia Transfer of Care Note  Patient: Anthony [name]  Procedure(s) Performed: Procedure(s) with comments: Right L4-5 L3-4 L2-3, L1-2  Anterior lateral lumbar fusion with percutaneaous pedicle screws. Lumbar four/five, three/four, two/three and possible two/one (Right) - Lumbar One-Five Fusion with Percutaneous Screws LUMBAR PERCUTANEOUS PEDICLE SCREW 4 LEVEL (N/A)  Patient Location: PACU  Anesthesia Type:General  Level of Consciousness: awake, alert  and oriented  Airway & Oxygen Therapy: Patient Spontanous Breathing and Patient connected to face mask oxygen  Post-op Assessment: Report given to PACU RN, Post -op Vital signs reviewed and stable and Patient moving all extremities X 4  Post vital signs: Reviewed and stable  Complications: No apparent anesthesia complications

## 2014-05-28 NOTE — Brief Op Note (Signed)
05/28/2014  7:30 PM  PATIENT:  Anthony [name]  68 y.o. male  PRE-OPERATIVE DIAGNOSIS:  Scoliosis, Lumbar degenerative disc disease, Lumbar spondylosis, Radiculopathy L 12, L 23, L 34, L 45 levels with loss of sagittal balance  POST-OPERATIVE DIAGNOSIS: Scoliosis, Lumbar degenerative disc disease, Lumbar spondylosis, Radiculopathy L 12, L 23, L 34, L 45 levels with loss of sagittal balance   PROCEDURE:  Procedure(s) with comments: Right L4-5 L3-4 L2-3, L1-2  Anterior lateral lumbar fusion with percutaneaous pedicle screws. Lumbar four/five, three/four, two/three and possible two/one (Right) - Lumbar One-Five Fusion with Percutaneous Screws LUMBAR PERCUTANEOUS PEDICLE SCREW 4 LEVEL (N/A) with correction of scoliosis and sagittal imbalance  SURGEON:  Surgeon(s) and Role:    * Erline Levine, MD - Primary    * Kristeen Miss, MD - Assisting  PHYSICIAN ASSISTANT:   ASSISTANTS: Poteat, RN   ANESTHESIA:   general  EBL:     BLOOD ADMINISTERED:none  DRAINS: none   LOCAL MEDICATIONS USED:  LIDOCAINE   SPECIMEN:  No Specimen  DISPOSITION OF SPECIMEN:  N/A  COUNTS:  YES  TOURNIQUET:  * No tourniquets in log *  DICTATION: DICTATION: Patient is a 68 year old with severe spondylosis stenosis and scoliosis of the lumbar spine. It was elected to take him to surgery for anterolateral decompression and posterior pedicle screw fixation.  He is significantly out of sagittal balance.  Procedure: Patient was brought to the operating room and placed in a left lateral decubitus position on the operative table and using orthogonally projected C-arm fluoroscopy the patient was placed so that the L 12, L2-3 L3-4 and L4-5 levels were visualized in AP and lateral plane. The patient was then taped into position. The table was flexed so as to expose the L4-5 level as the patient has a high iliac crest. Skin was marked along with a posterior finger dissection incision. His flank was then prepped and draped in  usual sterile fashion and incisions were made sequentially at L4-5 and then one single additional incision was made for the remaining three levels. Posterior finger dissection was made to enter the retroperitoneal space and then subsequently the probe was inserted into the psoas muscle from the left side initially at the L4-5 level. After mapping the neural elements were able to dock the probe per the midpoint of this vertebral level and without indications electrically of too close proximity to the neural tissues. Subsequently the self-retaining tractor was.after sequential dilators were utilized the shim was employed and the interspace was cleared of psoas muscle and then incised. A thorough discectomy was performed. Various instruments were used to clear the interspace of disc material. After thorough discectomy was performed and this was performed using AP and lateral fluoroscopy a 10 x 22 x 60 15 degree lordotic implant was packed with osteocel plus. This was tamped into position using the slides and its position was confirmed on AP and lateral fluoroscopy. Subsequently exposure was performed at the L3-4 level and similar dissection was performed with locking of the self-retaining retractor. At this level were able to place a 10 x 22 x 60 15 degree lordotic implant packed in a similar fashion. At the L2-3 level were able to place a 12 x 22 x 50 15 degree lordotic implant packed in a similar fashion.  At the L 12 level we were able to place a 12 x 22 x 50 10 degree lordotic implant packed in a similar fashion.Hemostasis was assured the wounds were irrigated and closed with interrupted  Vicryl sutures.  Patient was then turned into a prone position on the [place] table and using AP and lateral fluoroscopy throughout this portion of the procedure, pedicle screws were placed using Nuvasive cannulated percutaneous screws and the Bendini system with nerve monitoring throughout the surgery. 2 screws were placed at L1  and (6.5 x 50 mm) on the left and 6.5 x 45 mm on the right  Two at L 2  Both 45 mm in length, 2 at L3  Of similar size, and one at L4 50 mm in length on the left (the right sided pedicle was atretic on the concave side of the scoliotic curve  and 2 at L5 of 45 mm in length.  300 mm rods were then cut ( to 150 mm on the left and 120 mm on the right) and affixed to the screw heads after they were contoured to restore lordosis and each level was compressed sequentially and then  locked down on the screws. All connections were then torqued and the Towers were disassembled. The wounds were irrigated and then closed with 1, 2-0 and 3-0 Vicryl stitches. Sterile occlusive dressing was placed with Dermabond. The patient was then extubated in the operating room and taken to recovery in stable and satisfactory condition having tolerated his operation well. Counts were correct at the end of the case.  With the hyperlordotic cages and compression, we were able to restore the patient's lordosis to an acceptable level with mismatch reduced from 35 to 10 as well as correct his coronal deformity.   PLAN OF CARE: Admit to inpatient   PATIENT DISPOSITION:  PACU - hemodynamically stable.   Delay start of Pharmacological VTE agent (>24hrs) due to surgical blood loss or risk of bleeding: yes

## 2014-05-28 NOTE — Anesthesia Preprocedure Evaluation (Addendum)
Anesthesia Evaluation  Patient identified by MRN, date of birth, ID band Patient awake    Reviewed: Allergy & Precautions, H&P , NPO status , Patient's Chart, lab work & pertinent test results  History of Anesthesia Complications Negative for: history of anesthetic complications  Airway Mallampati: II TM Distance: >3 FB Neck ROM: Full    Dental  (+) Teeth Intact   Pulmonary neg shortness of breath, sleep apnea and Continuous Positive Airway Pressure Ventilation , COPD COPD inhaler, neg recent URI, former smoker,  breath sounds clear to auscultation        Cardiovascular hypertension, Pt. on medications and Pt. on home beta blockers - angina+ CAD, + Cardiac Stents, + Peripheral Vascular Disease and +CHF - Past MI - dysrhythmias - Valvular Problems/MurmursRhythm:Regular     Neuro/Psych  Neuromuscular disease negative psych ROS   GI/Hepatic negative GI ROS, Neg liver ROS,   Endo/Other  diabetes, Type 2  Renal/GU negative Renal ROS     Musculoskeletal   Abdominal   Peds  Hematology negative hematology ROS (+)   Anesthesia Other Findings   Reproductive/Obstetrics                          Anesthesia Physical Anesthesia Plan  ASA: III  Anesthesia Plan: General   Post-op Pain Management:    Induction: Intravenous  Airway Management Planned: Oral ETT  Additional Equipment: None  Intra-op Plan:   Post-operative Plan: Extubation in OR  Informed Consent: I have reviewed the patients History and Physical, chart, labs and discussed the procedure including the risks, benefits and alternatives for the proposed anesthesia with the patient or authorized representative who has indicated his/her understanding and acceptance.   Dental advisory given  Plan Discussed with: CRNA and Surgeon  Anesthesia Plan Comments:         Anesthesia Quick Evaluation

## 2014-05-28 NOTE — Op Note (Signed)
05/28/2014  7:30 PM  PATIENT:  Anthony [name]  68 y.o. male  PRE-OPERATIVE DIAGNOSIS:  Scoliosis, Lumbar degenerative disc disease, Lumbar spondylosis, Radiculopathy L 12, L 23, L 34, L 45 levels with loss of sagittal balance  POST-OPERATIVE DIAGNOSIS: Scoliosis, Lumbar degenerative disc disease, Lumbar spondylosis, Radiculopathy L 12, L 23, L 34, L 45 levels with loss of sagittal balance   PROCEDURE:  Procedure(s) with comments: Right L4-5 L3-4 L2-3, L1-2  Anterior lateral lumbar fusion with percutaneaous pedicle screws. Lumbar four/five, three/four, two/three and possible two/one (Right) - Lumbar One-Five Fusion with Percutaneous Screws LUMBAR PERCUTANEOUS PEDICLE SCREW 4 LEVEL (N/A) with correction of scoliosis and sagittal imbalance  SURGEON:  Surgeon(s) and Role:    * Erline Levine, MD - Primary    * Kristeen Miss, MD - Assisting  PHYSICIAN ASSISTANT:   ASSISTANTS: Poteat, RN   ANESTHESIA:   general  EBL:     BLOOD ADMINISTERED:none  DRAINS: none   LOCAL MEDICATIONS USED:  LIDOCAINE   SPECIMEN:  No Specimen  DISPOSITION OF SPECIMEN:  N/A  COUNTS:  YES  TOURNIQUET:  * No tourniquets in log *  DICTATION: DICTATION: Patient is a 68 year old with severe spondylosis stenosis and scoliosis of the lumbar spine. It was elected to take him to surgery for anterolateral decompression and posterior pedicle screw fixation.  He is significantly out of sagittal balance.  Procedure: Patient was brought to the operating room and placed in a left lateral decubitus position on the operative table and using orthogonally projected C-arm fluoroscopy the patient was placed so that the L 12, L2-3 L3-4 and L4-5 levels were visualized in AP and lateral plane. The patient was then taped into position. The table was flexed so as to expose the L4-5 level as the patient has a high iliac crest. Skin was marked along with a posterior finger dissection incision. His flank was then prepped and draped in  usual sterile fashion and incisions were made sequentially at L4-5 and then one single additional incision was made for the remaining three levels. Posterior finger dissection was made to enter the retroperitoneal space and then subsequently the probe was inserted into the psoas muscle from the left side initially at the L4-5 level. After mapping the neural elements were able to dock the probe per the midpoint of this vertebral level and without indications electrically of too close proximity to the neural tissues. Subsequently the self-retaining tractor was.after sequential dilators were utilized the shim was employed and the interspace was cleared of psoas muscle and then incised. A thorough discectomy was performed. Various instruments were used to clear the interspace of disc material. After thorough discectomy was performed and this was performed using AP and lateral fluoroscopy a 10 x 22 x 60 15 degree lordotic implant was packed with osteocel plus. This was tamped into position using the slides and its position was confirmed on AP and lateral fluoroscopy. Subsequently exposure was performed at the L3-4 level and similar dissection was performed with locking of the self-retaining retractor. At this level were able to place a 10 x 22 x 60 15 degree lordotic implant packed in a similar fashion. At the L2-3 level were able to place a 12 x 22 x 50 15 degree lordotic implant packed in a similar fashion.  At the L 12 level we were able to place a 12 x 22 x 50 10 degree lordotic implant packed in a similar fashion.Hemostasis was assured the wounds were irrigated and closed with interrupted  Vicryl sutures.  Patient was then turned into a prone position on the [place] table and using AP and lateral fluoroscopy throughout this portion of the procedure, pedicle screws were placed using Nuvasive cannulated percutaneous screws and the Bendini system with nerve monitoring throughout the surgery. 2 screws were placed at L1  and (6.5 x 50 mm) on the left and 6.5 x 45 mm on the right  Two at L 2  Both 45 mm in length, 2 at L3  Of similar size, and one at L4 50 mm in length on the left (the right sided pedicle was atretic on the concave side of the scoliotic curve  and 2 at L5 of 45 mm in length.  300 mm rods were then cut ( to 150 mm on the left and 120 mm on the right) and affixed to the screw heads after they were contoured to restore lordosis and each level was compressed sequentially and then  locked down on the screws. All connections were then torqued and the Towers were disassembled. The wounds were irrigated and then closed with 1, 2-0 and 3-0 Vicryl stitches. Sterile occlusive dressing was placed with Dermabond. The patient was then extubated in the operating room and taken to recovery in stable and satisfactory condition having tolerated his operation well. Counts were correct at the end of the case.  With the hyperlordotic cages and compression, we were able to restore the patient's lordosis to an acceptable level with mismatch reduced from 35 to 10 as well as correct his coronal deformity.   PLAN OF CARE: Admit to inpatient   PATIENT DISPOSITION:  PACU - hemodynamically stable.   Delay start of Pharmacological VTE agent (>24hrs) due to surgical blood loss or risk of bleeding: yes

## 2014-05-28 NOTE — Progress Notes (Signed)
Patient arrived from PACU via stretcher. VSS. Advanced diet to full liquid per patient request. Neuro intact. Pain 5/10. Will continue to monitor.  Cori Razor, RN

## 2014-05-28 NOTE — H&P (Signed)
[place], [place] [address] Phone: [phone]   Patient ID:   [id number] Patient: Anthony [name]  Date of Birth: October 06, 1946 Visit Type: Office Visit   Date: 05/06/2014 10:00 AM Provider: Marchia Meiers. Vertell Limber MD   This 68 year old male presents for Follow Up of Low back pain and Follow Up of Leg pain.  History of Present Illness: 1.  Follow Up of Low back pain  2.  Follow Up of Leg pain  Patient returns to review his thoracolumbar radiographs and MRI.  This shows that he has significant nerve root compression and severe scoliosis with the L4 L5, L3 L4, L2-3, L1-2 levels most severely affected.  He appears to have most severe left L5 nerve root compression as well as L4 nerve root compression at the L4-L5 level.  Based on my review of his imaging and examination I recommended that he undergo decompression and fusion and is to be done with a combination of anterolateral decompression and fusion with excellent procedure at those 4 affected levels with percutaneous pedicle screw fixation.  This be done for right-sided approach.  He do not believe that redo decompression will divide him significant relief.  Patient was fitted for LSO brace.  The specifics of the surgery went over in great detail with the patient and his wife.      Medical/Surgical/Interim History Reviewed, no change.  Last detailed document date:05/02/2014.   PAST MEDICAL HISTORY, SURGICAL HISTORY, FAMILY HISTORY, SOCIAL HISTORY AND REVIEW OF SYSTEMS I have reviewed the patient's past medical, surgical, family and social history as well as the comprehensive review of systems as included on the [facility] history form dated 05/02/2014, which I have signed.  Family History: Reviewed, no changes.  Last detailed document: 05/02/2014.   Social History: Tobacco use reviewed. Reviewed, no changes. Last detailed document date: 05/02/2014.      MEDICATIONS(added,  continued or stopped this visit):   Started Medication Directions Instruction Stopped  05/02/2014 hydrocodone 10 mg-acetaminophen 325 mg tablet take 1 tablet by oral route  every 4 - 6 hours as needed for pain      ALLERGIES:  Ingredient Reaction Medication Name Comment  NO KNOWN ALLERGIES     No known allergies. Reviewed, no changes.   Vitals Date Temp F BP Pulse Ht In Wt Lb BMI BSA Pain Score  05/06/2014  117/74 65 69 247 36.48  7/10      DIAGNOSTIC RESULTS Diagnostic report text  CLINICAL DATA: Low back pain extending to the left buttock, hip, and posterior thigh. Previous lumbar spine surgery 1997 and 2005. Lumbar radiculopathy.  BUN and creatinine were obtained on site at [facility] at  [address]  Results: BUN 27 mg/dL, Creatinine 1.2 mg/dL.  EXAM: MRI LUMBAR SPINE WITHOUT AND WITH CONTRAST  TECHNIQUE: Multiplanar and multiecho pulse sequences of the lumbar spine were obtained without and with intravenous contrast.  CONTRAST: 10mL MULTIHANCE GADOBENATE DIMEGLUMINE 529 MG/ML IV SOLN  COMPARISON: Lumbar spine radiographs 05/02/2014  FINDINGS: Normal signal is present in the conus medullaris which terminates at T12, within normal limits. Moderate levoconvex scoliosis of the lumbar spine is centered at L3. Edematous endplate marrow changes and collapse are present on the right at L3-4. There is no fluid or enhancement within the disc space. Asymmetric left-sided endplate marrow changes are present at T12-L1. Limited imaging of the abdomen is unremarkable.  T12-L1: A leftward disc protrusion is present. Moderate facet hypertrophy is worse  on the left. This results in mild to moderate left lateral recess and foraminal stenosis.  L1-2: A far left lateral disc protrusion is present. Mild facet hypertrophy is noted bilaterally. No significant stenosis is evident.  L2-3: Asymmetric right-sided facet hypertrophy is present. A moderate broad-based disc protrusion is noted. This  results in mild to moderate right lateral recess and foraminal stenosis. The left foramen is patent.  L3-4: A right laminotomy was performed. There is a residual broad-based disc herniation. Facet hypertrophy is worse on the left. Mild lateral recess narrowing is present bilaterally. Severe right and moderate left foraminal stenosis is evident.  L4-5: A broad disc protrusion is present. Facet hypertrophy is worse on the left. The patient is status post partial laminectomy. The central canal is patent. Moderate to severe left and mild right foraminal stenosis is present.  L5-S1: Disc signal and morphology are normal. Mild facet hypertrophy is noted. There is no significant stenosis.  The postcontrast images demonstrate enhancement associated with the areas of described endplate edema. There is also enhancement posteriorly at the operative levels, L3-4 and L4-5.  IMPRESSION: 1. Moderate scoliosis of the lumbar spine is centered the left at L3. 2. Mild to moderate left lateral recess and foraminal stenosis at T12-L1. 3. Far left lateral disc protrusion at L1-2 without significant stenosis. 4. Mild to moderate right lateral recess and foraminal stenosis at L2-3. 5. Severe right and moderate left foraminal stenosis at L3-4. 6. Moderate to severe left and mild right foraminal stenosis at L4-5. This is the most severe level of left-sided disease. 7. Postoperative changes at L3-4 and L4-5 as described.   Electronically Signed By: Lawrence Santiago M.D. On: 05/04/2014 17:55   Embedded Images (not for diagnostic purposes)        IMPRESSION Scoliosis, spondylolisthesis, recurrent stenosis, radiculopathy L45, L3 L4, L2-3, L1-2 levels.  Plan is decompression and fusion with excellent and pedicle screw fixation from the right-sided approach area patient was fitted with L also brace.  Risks and benefits were discussed in detail with patient and wishes to proceed with surgery.  Completed Orders (this encounter) Order  Details Reason Side Interpretation Result Initial Treatment Date Region  Lifestyle education regarding diet Encouraged to eat a well balanced diet and follow up with primary care physician.         Assessment/Plan # Detail Type Description   1. Assessment Lumbar scoliosis (737.30).       2. Assessment Lumbar radiculopathy (724.4).       3. Assessment Lumbar spondylosis (721.3).       4. Assessment Lumbar disc degenerative disease (722.52).       5. Assessment BMI 36.0-36.9,ADULT (V85.36).   Plan Orders Today's instructions / counseling include(s) Lifestyle education regarding diet.         Pain Assessment/Treatment Pain Scale: 7/10. Method: Numeric Pain Intensity Scale. Location: back. Onset: 02/23/2014. Duration: varies. Quality: aching. Pain Assessment/Treatment follow-up plan of care: Patient currently taking pain medication as prescribed..  Fall Risk Plan The patient has not fallen in the last year.  Surgery scheduled for May 28, 2014.  Orders: Instruction(s)/Education: Assessment Instruction  V85.36 Lifestyle education regarding diet             Provider:  Marchia Meiers. Vertell Limber MD  05/12/2014 04:05 PM Dictation edited by: Marchia Meiers. Vertell Limber    CC Providers: Erline Levine MD [address] [place], [place] [address] ----------------------------------------------------------------------------------------------------------------------------------------------------------------------         Electronically signed by Marchia Meiers. Vertell Limber MD on 05/12/2014  04:05 PM  > [place], [place] [address] Phone: [phone]   Patient ID:   [id number] Patient: Anthony [name]  Date of Birth: 10/30/1946 Visit Type: Office Visit   Date: 05/02/2014 02:00 PM Provider: Marchia Meiers. Vertell Limber MD   This 68 year old male presents for Low back pain and Leg pain.  History of Present Illness: 1.  Low back pain  2.  Leg pain  Gwen Pounds, [phone].o.  retired male reports increasing lumbar & left leg pain since February.  No recollection of injury.    Chiropractic stim tx without pain reduction.  Tramadol 50mg  4-5/day lately  Hx: HTN, CHF, NIDDM, COPD, PAD(Lgroin, Rknee),  SxHx: 1997& 2005 Lumbar Lam, 2002 cervical fusion  X-ray on [place]  Patient is retired.  He used to work at [employer] and ran a [employer].  He and his wife moved from [place] to be with his children and grandchildren.  He has had prior lumbar laminectomy 2 and also an extensive cervical fusion with multilevel corpectomy and anterior reconstruction performed in 2002.  The patient describes that he is in miserable pain right now and is unable to get any relief unless he is supine and uses ice.  He has been using tramadol 50 mg up to 5 times daily.  He went to chiropractor and said he had stimulation without any relief  Currently the patient complains of left buttock pain radiating to his left leg to his ankle.  He describes that he goes to the inside of his ankle.        PAST MEDICAL/SURGICAL HISTORY   (Detailed)  Disease/disorder Onset Date Management Date Comments    Surgery, lumbar spine  ER 05/02/2014 -1997 and 2005    Spinal fusion, cervical 2002   Arthritis      Diabetes mellitus      Hypertension          PAST MEDICAL HISTORY, SURGICAL HISTORY, FAMILY HISTORY, SOCIAL HISTORY AND REVIEW OF SYSTEMS I have reviewed the patient's past medical, surgical, family and social history as well as the comprehensive review of systems as included on the [facility] history form dated 05/02/2014, which I have signed.  Family History  (Detailed)  Relationship Family Member Name Deceased Age at Death Condition Onset Age Cause of Death  Father    Cancer, colon  N  Friend    Stroke  N  Mother    Cancer, uterine  N   SOCIAL HISTORY  (Detailed) Tobacco use reviewed. Preferred language is Unknown.   Smoking status: Never  smoker.  SMOKING STATUS Use Status Type Smoking Status Usage Per Day Years Used Total Pack Years  no/never  Never smoker       HOME ENVIRONMENT/SAFETY The patient has not fallen in the last year.        MEDICATIONS(added, continued or stopped this visit):   Started Medication Directions Instruction Stopped  05/02/2014 hydrocodone 10 mg-acetaminophen 325 mg tablet take 1 tablet by oral route  every 4 - 6 hours as needed for pain      ALLERGIES:  Ingredient Reaction Medication Name Comment  NO KNOWN ALLERGIES     No known allergies.  REVIEW OF SYSTEMS System Neg/Pos Details  Constitutional Negative Chills, fatigue, fever, malaise, night sweats, weight gain and weight loss.  ENMT Negative Ear drainage, hearing loss, nasal drainage, otalgia, sinus pressure and sore throat.  Eyes Negative Eye discharge, eye pain and vision changes.  Respiratory Negative  Chronic cough, cough, dyspnea, known TB exposure and wheezing.  Cardio Negative Chest pain, claudication, edema and irregular heartbeat/palpitations.  GI Negative Abdominal pain, blood in stool, change in stool pattern, constipation, decreased appetite, diarrhea, heartburn, nausea and vomiting.  GU Negative Dribbling, dysuria, erectile dysfunction, hematuria, polyuria, slow stream, urinary frequency, urinary incontinence and urinary retention.  Endocrine Negative Cold intolerance, heat intolerance, polydipsia and polyphagia.  Neuro Negative Dizziness, extremity weakness, gait disturbance, headache, memory impairment, numbness in extremity, seizures and tremors.  Psych Negative Anxiety, depression and insomnia.  Integumentary Negative Brittle hair, brittle nails, change in shape/size of mole(s), hair loss, hirsutism, hives, pruritus, rash and skin lesion.  MS Positive Back pain, LLE pain.  Hema/Lymph Negative Easy bleeding, easy bruising and lymphadenopathy.  Allergic/Immuno Negative Contact allergy, environmental allergies, food  allergies and seasonal allergies.  Reproductive Negative Penile discharge and sexual dysfunction.    Vitals Date Temp F BP Pulse Ht In Wt Lb BMI BSA Pain Score  05/02/2014  113/70 73 69 247 36.48  9/10     PHYSICAL EXAM General Level of Distress: no acute distress Overall Appearance: normal  Head and Face  Right Left  Fundoscopic Exam:  normal normal    Cardiovascular Cardiac: regular rate and rhythm without murmur  Right Left  Carotid Pulses: normal normal  Respiratory Lungs: clear to auscultation  Neurological Orientation: normal Recent and Remote Memory: normal Attention Span and Concentration:   normal Language: normal Fund of Knowledge: normal  Right Left Sensation: normal normal Upper Extremity Coordination: normal normal  Lower Extremity Coordination: normal normal  Musculoskeletal Gait and Station: normal  Right Left Upper Extremity Muscle Strength: normal normal Lower Extremity Muscle Strength: normal normal Upper Extremity Muscle Tone:  normal normal Lower Extremity Muscle Tone: normal normal  Motor Strength Upper and lower extremity motor strength was tested in the clinically pertinent muscles.     Deep Tendon Reflexes  Right Left Biceps: normal normal Triceps: normal normal Brachiloradialis: normal normal Patellar: decrease normal Achilles: normal normal  Cranial Nerves II. Optic Nerve/Visual Fields: normal III. Oculomotor: normal IV. Trochlear: normal V. Trigeminal: normal VI. Abducens: normal VII. Facial: normal VIII. Acoustic/Vestibular: normal IX. Glossopharyngeal: normal X. Vagus: normal XI. Spinal Accessory: normal XII. Hypoglossal: normal  Motor and other Tests Lhermittes: negative Rhomberg: negative Pronator drift: absent     Right Left Spurlings negative negative Hoffman's: normal normal Clonus: normal normal Babinski: normal normal SLR:  positive at 45 degrees   Additional Findings:  Patient has significant  left sciatic notch discomfort to palpation.  He is able to stand on his heels and toes and able to squat on either leg independently.  He has a healed lumbar midline incision.    DIAGNOSTIC RESULTS Plain radiographs demonstrate levoconvex scoliosis with the apex at L3 L4 and with multilevel degenerative changes.  He appears to have a prior laminectomy at L2-3 on the left.  He has calcification of the abdominal aorta and iliac arteries.  Cervical radiographs show multilevel corpectomy with plating from what appears to be C3 through to the C7 level.  This appears to be well-healed.  No hardware loosening or complications.    IMPRESSION Patient appeared to have a significant left lower lumbar radiculopathy.  He is quite miserable and has severe pain.  He has significant lumbar scoliosis.  Completed Orders (this encounter) Order Details Reason Side Interpretation Result Initial Treatment Date Region  Lifestyle education regarding diet Encouraged to eat a well balanced diet and follow up with primary care physician.  Lumbar Spine- AP/Lat/Flex/Ex      05/02/2014 All Levels to All Levels   Assessment/Plan # Detail Type Description   1. Assessment Lumbar radiculopathy (724.4).       2. Assessment Lumbar scoliosis (737.30).       3. Assessment Lumbar spondylosis (721.3).       4. Assessment Lumbar disc degenerative disease (722.52).       5. Assessment BMI 36.0-36.9,ADULT (V85.36).   Plan Orders Today's instructions / counseling include(s) Lifestyle education regarding diet.         Pain Assessment/Treatment Pain Scale: 9/10. Method: Numeric Pain Intensity Scale. Location: low back/left leg. Onset: 02/23/2014. Duration: varies. Quality: aching, sharp. Pain Assessment/Treatment follow-up plan of care: Patient currently taking Tramadol for pain..  Fall Risk Plan The patient has not fallen in the last year.  I have recommended the patient undergo a lumbar MRI with and without  gadolinium.  I will make further recommendations after the studies can perform.  I have given him a prescription for pain medication.  Orders: Diagnostic Procedures: Assessment Procedure   Lumbar Spine- AP/Lat/Flex/Ex  724.4 MRI Spine/lumb With & W/o Contrast  724.4 MRI Spine/lumb With & W/o Contrast  724.4 Return to Clinic after study is performed  737.30 Scoliosis- AP/Lat  Instruction(s)/Education: Assessment Instruction  V85.36 Lifestyle education regarding diet    MEDICATIONS PRESCRIBED TODAY    Rx Quantity Refills  HYDROCODONE-ACETAMINOPHEN 10 mg-325 mg  90 0            Provider:  Marchia Meiers. Vertell Limber MD  05/02/2014 06:54 PM Dictation edited by: Marchia Meiers. Vertell Limber    CC Providers: Erline Levine MD [address] [place], [place] [address] ----------------------------------------------------------------------------------------------------------------------------------------------------------------------         Electronically signed by Marchia Meiers. Vertell Limber MD on 05/02/2014 06:55 PM

## 2014-05-28 NOTE — Anesthesia Postprocedure Evaluation (Signed)
  Anesthesia Post-op Note  Patient: Anthony [name]  Procedure(s) Performed: Procedure(s) with comments: Right L4-5 L3-4 L2-3, L1-2  Anterior lateral lumbar fusion with percutaneaous pedicle screws. Lumbar four/five, three/four, two/three and possible two/one (Right) - Lumbar One-Five Fusion with Percutaneous Screws LUMBAR PERCUTANEOUS PEDICLE SCREW 4 LEVEL (N/A)  Patient Location: PACU  Anesthesia Type:General  Level of Consciousness: awake and alert   Airway and Oxygen Therapy: Patient Spontanous Breathing  Post-op Pain: mild  Post-op Assessment: Post-op Vital signs reviewed, Patient's Cardiovascular Status Stable and Respiratory Function Stable  Post-op Vital Signs: Reviewed  Filed Vitals:   05/28/14 1827  BP: 122/63  Pulse: 73  Temp: 36.8 C  Resp: 12    Complications: No apparent anesthesia complications

## 2014-05-28 NOTE — Anesthesia Procedure Notes (Signed)
Procedure Name: Intubation Date/Time: 05/28/2014 10:36 AM Performed by: Ollen Bowl Pre-anesthesia Checklist: Patient identified, Timeout performed, Emergency Drugs available, Suction available and Patient being monitored Patient Re-evaluated:Patient Re-evaluated prior to inductionOxygen Delivery Method: Circle system utilized and Simple face mask Preoxygenation: Pre-oxygenation with 100% oxygen Intubation Type: IV induction Ventilation: Mask ventilation without difficulty Laryngoscope Size: Miller and 3 Grade View: Grade I Tube type: Oral Tube size: 7.5 mm Number of attempts: 1 Airway Equipment and Method: Patient positioned with wedge pillow and Stylet Placement Confirmation: ETT inserted through vocal cords under direct vision,  positive ETCO2 and breath sounds checked- equal and bilateral Secured at: 22 cm Tube secured with: Tape Dental Injury: Teeth and Oropharynx as per pre-operative assessment

## 2014-05-28 NOTE — Progress Notes (Signed)
Awake, alert, conversant.  MAEW with good strength.  Doing well. 

## 2014-05-29 LAB — GLUCOSE, CAPILLARY
Glucose-Capillary: 206 mg/dL — ABNORMAL HIGH (ref 70–99)
Glucose-Capillary: 215 mg/dL — ABNORMAL HIGH (ref 70–99)
Glucose-Capillary: 128 mg/dL — ABNORMAL HIGH (ref 70–99)

## 2014-05-29 LAB — HEMOGLOBIN A1C
Hgb A1c MFr Bld: 6.5 % — ABNORMAL HIGH (ref <5.7)
Mean Plasma Glucose: 140 mg/dL — ABNORMAL HIGH (ref <117)

## 2014-05-29 MED ORDER — PANTOPRAZOLE SODIUM 40 MG PO TBEC
40.0000 mg | DELAYED_RELEASE_TABLET | Freq: Every day | ORAL | Status: DC
  Administered 2014-05-29 – 2014-05-30 (×2): 40 mg via ORAL
  Filled 2014-05-29 (×2): qty 1

## 2014-05-29 NOTE — Evaluation (Signed)
Physical Therapy Evaluation Patient Details Name: Anthony [name] MRN: [id number] DOB: 1946-12-17 Today's Date: 05/29/2014   History of Present Illness  pt s/p L1-L5 ALIF  Clinical Impression  Pt admitted with/for lumbar fusion surgery.  The back education has been initiated with pt and wife, including precautions, back brace issues, lifting restrictions, bed mobility and progression of activity.  Pt currently limited functionally due to the problems listed below.  (see problems list.)  Pt will benefit from PT to maximize function and safety to be able to get home safely with available assist of wife.     Follow Up Recommendations No PT follow up    Equipment Recommendations  Rolling walker with 5" wheels    Recommendations for Other Services       Precautions / Restrictions Precautions Precautions: Back Required Braces or Orthoses: Spinal Brace Spinal Brace: Lumbar corset;Applied in sitting position      Mobility  Bed Mobility Overal bed mobility: Needs Assistance Bed Mobility: Rolling;Sidelying to Sit;Sit to Sidelying Rolling: Min guard Sidelying to sit: Min guard     Sit to sidelying: Min guard General bed mobility comments: cues for technique  Transfers Overall transfer level: Needs assistance   Transfers: Sit to/from Stand Sit to Stand: Min guard         General transfer comment: cues for hand placement  Ambulation/Gait Ambulation/Gait assistance: Min guard Ambulation Distance (Feet): 170 Feet Assistive device: Rolling walker (2 wheeled) Gait Pattern/deviations: Step-through pattern   Gait velocity interpretation: Below normal speed for age/gender General Gait Details: steady, but slow  Stairs            Wheelchair Mobility    Modified Rankin (Stroke Patients Only)       Balance Overall balance assessment: No apparent balance deficits (not formally assessed)                                           Pertinent  Vitals/Pain     Home Living Family/patient expects to be discharged to:: Private residence Living Arrangements: Spouse/significant other Available Help at Discharge: Family;Available 24 hours/day Type of Home: House Home Access: Level entry     Home Layout: One level Home Equipment: Shower seat - built in      Prior Function Level of Independence: Independent               Hand Dominance   Dominant Hand: Right    Extremity/Trunk Assessment               Lower Extremity Assessment: Overall WFL for tasks assessed         Communication   Communication: No difficulties  Cognition Arousal/Alertness: Awake/alert Behavior During Therapy: WFL for tasks assessed/performed Overall Cognitive Status: Within Functional Limits for tasks assessed                      General Comments      Exercises        Assessment/Plan    PT Assessment Patient needs continued PT services  PT Diagnosis     PT Problem List Decreased strength;Decreased activity tolerance;Decreased mobility;Decreased knowledge of use of DME  PT Treatment Interventions DME instruction;Gait training;Functional mobility training;Therapeutic activities;Patient/family education   PT Goals (Current goals can be found in the Care Plan section) Acute Rehab PT Goals Patient Stated Goal: back to independence PT Goal Formulation: With  patient Time For Goal Achievement: 06/05/14 Potential to Achieve Goals: Good    Frequency Min 5X/week   Barriers to discharge        Co-evaluation               End of Session   Activity Tolerance: Patient tolerated treatment well Patient left: in chair;with call bell/phone within reach Nurse Communication: Mobility status         Time: 1040-1100 PT Time Calculation (min): 20 min   Charges:   PT Evaluation $Initial PT Evaluation Tier I: 1 Procedure PT Treatments $Gait Training: 8-22 mins   PT G CodesJack Quarto  V 05/29/2014, 12:37 PM 05/29/2014  Donnella Sham, PT [phone]  (pager)

## 2014-05-29 NOTE — Progress Notes (Signed)
Patient ID: Anthony [name], male   DOB: May 13, 1946, 68 y.o.   MRN: [id number] Alert and oriented. Moderate soreness in the back. The leg pain. Incisions are clean and dry Stable

## 2014-05-29 NOTE — Evaluation (Signed)
Occupational Therapy Evaluation Patient Details Name: Anthony [name] MRN: [id number] DOB: 1946-11-04 Today's Date: 05/29/2014    History of Present Illness pt s/p L1-L5 ALIF   Clinical Impression   Pt s/p above. Pt moving well. OT provided education and no further OT needs. Will sign off.     Follow Up Recommendations  No OT follow up;Supervision - Intermittent    Equipment Recommendations  None recommended by OT    Recommendations for Other Services       Precautions / Restrictions Precautions Precautions: Back Precaution Comments: Reviewed precautions with pt Required Braces or Orthoses: Spinal Brace Spinal Brace: Lumbar corset;Applied in sitting position      Mobility Bed Mobility Overal bed mobility: Needs Assistance Bed Mobility: Rolling;Sidelying to Sit;Sit to Sidelying Rolling: Min guard Sidelying to sit: Min guard     Sit to sidelying: Mod assist;Min assist General bed mobility comments: Assistance with LE's. Practiced x2 and showed spouse. Cues for technique.  Transfers Overall transfer level: Needs assistance Equipment used: Rolling walker (2 wheeled) Transfers: Sit to/from Stand Sit to Stand: Min guard         General transfer comment: Cues for hand placement    Balance                                            ADL Overall ADL's : Needs assistance/impaired                 Upper Body Dressing : Sitting;Supervision/safety (doffed back brace sitting)   Lower Body Dressing: Maximal assistance;Sit to/from stand   Toilet Transfer: Supervision/safety;Ambulation;RW (chair; Min guard sit to stand from bed)       Tub/ Shower Transfer: Min guard;Ambulation;Rolling walker   Functional mobility during ADLs: Min guard;Supervision/safety;Rolling walker General ADL Comments: Educated on use of cups for teeth care and placement of grooming items. Educated on AE for LB ADLs (pt did not want to practice and says wife will assist).  Recommended sitting for LB dressing and to sit for LB bathing unless wife is going to wash LB. Educated on toilet aid for hygiene if pt uses bathroom somewhere else (has bidet at home). Told pt to have clothing under back brace and no sleeping in it. Discussed use of bag on walker, safe shoewear, and rugs.     Vision                     Perception     Praxis      Pertinent Vitals/Pain Pain in back but not rated.      Hand Dominance Right   Extremity/Trunk Assessment Upper Extremity Assessment Upper Extremity Assessment: Overall WFL for tasks assessed   Lower Extremity Assessment Lower Extremity Assessment: Defer to PT evaluation       Communication Communication Communication: No difficulties   Cognition Arousal/Alertness: Awake/alert Behavior During Therapy: WFL for tasks assessed/performed Overall Cognitive Status: Within Functional Limits for tasks assessed                     General Comments       Exercises       Shoulder Instructions      Home Living Family/patient expects to be discharged to:: Private residence Living Arrangements: Spouse/significant other Available Help at Discharge: Family;Available 24 hours/day Type of Home: House Home Access: Level entry  Home Layout: One level     Bathroom Shower/Tub: Occupational psychologist: Handicapped height     Home Equipment: Shower seat - built in;Adaptive equipment;Hand held Architectural technologist: Reacher;Long-handled Conservation officer, historic buildings;Other (Comment) (long handled brush; bidet)        Prior Functioning/Environment Level of Independence: Independent             OT Diagnosis:     OT Problem List:     OT Treatment/Interventions:      OT Goals(Current goals can be found in the care plan section)    OT Frequency:     Barriers to D/C:            Co-evaluation              End of Session Equipment Utilized During Treatment: Gait belt;Rolling  walker;Back brace  Activity Tolerance: Patient tolerated treatment well Patient left: in bed;with call bell/phone within reach;with family/visitor present   Time: 8338-2505 OT Time Calculation (min): 29 min Charges:  OT General Charges $OT Visit: 1 Procedure OT Evaluation $Initial OT Evaluation Tier I: 1 Procedure OT Treatments $Self Care/Home Management : 8-22 mins G-Codes:    Anthony [name] OTR/L [phone] 05/29/2014, 3:19 PM

## 2014-05-29 NOTE — Progress Notes (Signed)
[facility] d/c at [phone]. Pt is due to void. Endorsed to on coming nurse.

## 2014-05-29 NOTE — Clinical Social Work Note (Signed)
CSW consulted for possible SNF placement at time of discharge. PT recommending no PT follow-up at this time. CSW signing off. Thank you for the referral.  Lubertha Sayres, MSW, [facility] Licensed Clinical Social Worker [phone] and [phone]

## 2014-05-29 NOTE — Progress Notes (Signed)
CARE MANAGEMENT NOTE 05/29/2014  Patient:  Anthony [name], Anthony [name]   Account Number:  [id number]  Date Initiated:  05/29/2014  Documentation initiated by:  Olga Coaster  Subjective/Objective Assessment:   ADMITTED FOR SURGERY     Action/Plan:   CM FOLLOWING FOR DCP   Anticipated DC Date:  06/01/2014   Anticipated DC Plan:  AWAITING ON PT/OT EVALS FOR DISPOSITION NEEDS     DC Planning Services  CM consult         Status of service:  In process, will continue to follow Medicare Important Message given?   (If response is "NO", the following Medicare IM given date fields will be blank)  Per UR Regulation:  Reviewed for med. necessity/level of care/duration of stay  Comments:  6/3/2015Mindi Slicker RN,BSN,MHA [phone]

## 2014-05-29 NOTE — Progress Notes (Signed)
Subjective: Patient reports "I'm just sore in my low back. Last night was painful in that bed, but not bad now"  Objective: Vital signs in last 24 hours: Temp:  [98.2 F (36.8 C)-99.3 F (37.4 C)] 98.9 F (37.2 C) (06/03 0552) Pulse Rate:  [70-86] 72 (06/03 0552) Resp:  [9-20] 18 (06/03 0552) BP: (106-164)/(50-79) 164/66 mmHg (06/03 0552) SpO2:  [93 %-100 %] 99 % (06/03 0552)  Intake/Output from previous day: 06/02 0701 - 06/03 0700 In: 3150 [I.V.:2900; IV Piggyback:250] Out: 7654 [Urine:2925; Blood:450] Intake/Output this shift:    Sitting in chair in LSO. Conversant, reporting no problems.Incisions with dermabond, no erythema, swelling, or drainage. Good strength BLE. Denies buttock/hip/thigh pain; but verbalizes understanding that intermittent leg pain is likely over next 25mos.  Lab Results: No results found for this basename: WBC, HGB, HCT, PLT,  in the last 72 hours BMET No results found for this basename: NA, K, CL, CO2, GLUCOSE, BUN, CREATININE, CALCIUM,  in the last 72 hours  Studies/Results: Dg Lumbar Spine 2-3 Views  05/28/2014   CLINICAL DATA:  Lumbar fusion.  EXAM: LUMBAR SPINE - 2-3 VIEW; DG C-ARM GT 120 MIN  COMPARISON:  None.  FINDINGS: There are pedicle screws, posterior rods and interbody fusion devices fusing L2-L5. Well-positioned hardware without complicating features.  IMPRESSION: Posterior and interbody fusion L2-L5.   Electronically Signed   By: Kalman Jewels M.D.   On: 05/28/2014 18:10   Dg C-arm Gt 120 Min  05/28/2014   CLINICAL DATA:  Lumbar fusion.  EXAM: LUMBAR SPINE - 2-3 VIEW; DG C-ARM GT 120 MIN  COMPARISON:  None.  FINDINGS: There are pedicle screws, posterior rods and interbody fusion devices fusing L2-L5. Well-positioned hardware without complicating features.  IMPRESSION: Posterior and interbody fusion L2-L5.   Electronically Signed   By: Kalman Jewels M.D.   On: 05/28/2014 18:10    Assessment/Plan: Improving   LOS: 1 day  Mobilize in LSO  with PT.    Anthony [name] 05/29/2014, 8:58 AM

## 2014-05-30 LAB — GLUCOSE, CAPILLARY
Glucose-Capillary: 135 mg/dL — ABNORMAL HIGH (ref 70–99)
Glucose-Capillary: 152 mg/dL — ABNORMAL HIGH (ref 70–99)

## 2014-05-30 MED ORDER — DIAZEPAM 5 MG PO TABS: 5.0000 mg | ORAL_TABLET | Freq: Four times a day (QID) | ORAL | Status: DC | PRN

## 2014-05-30 MED ORDER — HYDROCODONE-ACETAMINOPHEN 10-325 MG PO TABS: 1.0000 | ORAL_TABLET | Freq: Four times a day (QID) | ORAL | Status: DC | PRN

## 2014-05-30 NOTE — Progress Notes (Signed)
Talked to patient about DCP; noted physical therapy notes recommending a rolling walker; patient stated that " my wife picked me up one yesterday". Mindi Slicker RN,BSN,MHA [phone]

## 2014-05-30 NOTE — Discharge Summary (Signed)
Physician Discharge Summary  Patient ID: Anthony [name] MRN: [id number] DOB/AGE: Jun 11, 1946 68 y.o.  Admit date: 05/28/2014 Discharge date: 05/30/2014  Admission Diagnoses:Scoliosis, Lumbar degenerative disc disease, Lumbar spondylosis, Radiculopathy L 12, L 23, L 34, L 45 levels with loss of sagittal balance    Discharge Diagnoses: Scoliosis, Lumbar degenerative disc disease, Lumbar spondylosis, Radiculopathy L 12, L 23, L 34, L 45 levels with loss of sagittal balance  s/p Right L4-5 L3-4 L2-3, L1-2  Anterior lateral lumbar fusion with percutaneaous pedicle screws. Lumbar four/five, three/four, two/three and possible two/one (Right) - Lumbar One-Five Fusion with Percutaneous Screws LUMBAR PERCUTANEOUS PEDICLE SCREW 4 LEVEL (N/A) with correction of scoliosis and sagittal imbalance   Active Problems:   Lumbar spine scoliosis   Discharged Condition: good  Hospital Course: Levent Kornegay was admitted for surgery with dx scoliosis, spondylosis, and radiculopathy. Following uncomplicated C5-Y8 Anterolateral fusion with percutaneous pedicle screws, he recovered nicely in Neuro PACU and transferred to 4N for nursing care and therapies. He has progressed well.  Consults: None  Significant Diagnostic Studies: radiology: X-Ray: intra-operative  Treatments: surgery: Right L4-5 L3-4 L2-3, L1-2  Anterior lateral lumbar fusion with percutaneaous pedicle screws. Lumbar four/five, three/four, two/three and possible two/one (Right) - Lumbar One-Five Fusion with Percutaneous Screws LUMBAR PERCUTANEOUS PEDICLE SCREW 4 LEVEL (N/A) with correction of scoliosis and sagittal imbalance    Discharge Exam: Blood pressure 156/72, pulse 80, temperature 98.8 F (37.1 C), temperature source Oral, resp. rate 20, height 5' 7.5" (1.715 m), weight 111.585 kg (246 lb), SpO2 95.00%. Alert, conversant, sitting in chair. Incisions with Dermabond, no erythema, swelling, or drainage. good strength BLE. Lumbar soreness only.  Denies thigh/leg pain. Notes one episode of right thigh spasm last night relieved by Valium PO.    Disposition: 01-Home or Self Care  Pt verballizes understanding of d/c instructions.   Pt will call office to schedule 3-4 week f/u with DrStern       Medication List    ASK your doctor about these medications       albuterol 108 (90 BASE) MCG/ACT inhaler  Commonly known as:  PROAIR HFA  Inhale 1-2 puffs into the lungs every 6 (six) hours as needed for wheezing or shortness of breath.     aspirin 325 MG tablet  Take 325 mg by mouth daily.     bisoprolol 5 MG tablet  Commonly known as:  ZEBETA  Take 5 mg by mouth daily.     CINNAMON PO  Take 2 tablets by mouth daily.     doxazosin 4 MG tablet  Commonly known as:  CARDURA  Take 2 mg by mouth at bedtime.     doxycycline 50 MG tablet  Commonly known as:  ADOXA  Take 50 mg by mouth daily.     fish oil-omega-3 fatty acids 1000 MG capsule  Take 3 g by mouth daily.     fluticasone 50 MCG/ACT nasal spray  Commonly known as:  FLONASE  Place 2 sprays into the nose at bedtime as needed for allergies.     furosemide 20 MG tablet  Commonly known as:  LASIX  Take 20 mg by mouth daily.     GARLIC PO  Take 1 tablet by mouth daily.     glipiZIDE 5 MG 24 hr tablet  Commonly known as:  GLUCOTROL XL  Take 5 mg by mouth daily.     HYDROcodone-acetaminophen 10-325 MG per tablet  Commonly known as:  NORCO  Take 1 tablet by mouth  every 6 (six) hours as needed (pain).     isosorbide mononitrate 30 MG 24 hr tablet  Commonly known as:  IMDUR  Take 1 tablet (30 mg total) by mouth daily.     L-Lysine 1000 MG Tabs  Take 1,000 mg by mouth daily.     losartan 100 MG tablet  Commonly known as:  COZAAR  Take 100 mg by mouth daily.     metFORMIN 1000 MG tablet  Commonly known as:  GLUCOPHAGE  Take 1,000 mg by mouth 2 (two) times daily with a meal.     MISC NATURAL PRODUCTS PO  Take 500 mg by mouth daily. TUMERIC capsules      multivitamin tablet  Take 1 tablet by mouth daily.     pentoxifylline 400 MG CR tablet  Commonly known as:  TRENTAL  Take 1 tablet (400 mg total) by mouth 2 (two) times daily.     potassium chloride SA 20 MEQ tablet  Commonly known as:  K-DUR,KLOR-CON  Take 20 mEq by mouth 2 (two) times daily.     rOPINIRole 1 MG tablet  Commonly known as:  REQUIP  Take 1 mg by mouth at bedtime.     simvastatin 40 MG tablet  Commonly known as:  ZOCOR  Take 40 mg by mouth every evening.     tiotropium 18 MCG inhalation capsule  Commonly known as:  SPIRIVA  Place 18 mcg into inhaler and inhale every other day.     VITAMIN B COMPLEX PO  Take 1 tablet by mouth 2 (two) times daily.         SignedVerdis Prime 05/30/2014, 8:19 AM

## 2014-05-30 NOTE — Progress Notes (Signed)
Subjective: Patient reports "I feel good. Not really hurting"  Objective: Vital signs in last 24 hours: Temp:  [97.8 F (36.6 C)-99.4 F (37.4 C)] 98.8 F (37.1 C) (06/04 0024) Pulse Rate:  [76-86] 80 (06/04 0458) Resp:  [18-20] 20 (06/04 0458) BP: (123-156)/(49-75) 156/72 mmHg (06/04 0458) SpO2:  [93 %-98 %] 95 % (06/04 0458)  Intake/Output from previous day: 06/03 0701 - 06/04 0700 In: 220 [P.O.:220] Out: -  Intake/Output this shift:    Alert, conversant, sitting in chair. Incisions with Dermabond, no erythema, swelling, or drainage. good strength BLE. Lumbar soreness only. Denies thigh/leg pain. Notes one episode of right thigh spasm last night relieved by Valium PO.  Lab Results: No results found for this basename: WBC, HGB, HCT, PLT,  in the last 72 hours BMET No results found for this basename: NA, K, CL, CO2, GLUCOSE, BUN, CREATININE, CALCIUM,  in the last 72 hours  Studies/Results: Dg Lumbar Spine 2-3 Views  05/28/2014   CLINICAL DATA:  Lumbar fusion.  EXAM: LUMBAR SPINE - 2-3 VIEW; DG C-ARM GT 120 MIN  COMPARISON:  None.  FINDINGS: There are pedicle screws, posterior rods and interbody fusion devices fusing L2-L5. Well-positioned hardware without complicating features.  IMPRESSION: Posterior and interbody fusion L2-L5.   Electronically Signed   By: Kalman Jewels M.D.   On: 05/28/2014 18:10   Dg C-arm Gt 120 Min  05/28/2014   CLINICAL DATA:  Lumbar fusion.  EXAM: LUMBAR SPINE - 2-3 VIEW; DG C-ARM GT 120 MIN  COMPARISON:  None.  FINDINGS: There are pedicle screws, posterior rods and interbody fusion devices fusing L2-L5. Well-positioned hardware without complicating features.  IMPRESSION: Posterior and interbody fusion L2-L5.   Electronically Signed   By: Kalman Jewels M.D.   On: 05/28/2014 18:10    Assessment/Plan: Improving   LOS: 2 days  PT/OT signed off. Pt verballizes understanding of d/c instructions. Will d/w DrElsner & request d/c order and rx's for home.  Pt  will call office to schedule 3-4 week f/u with DrStern   Verdis Prime 05/30/2014, 8:12 AM

## 2014-05-30 NOTE — Progress Notes (Signed)
Patient changed his mind and is now requesting a rolling walker; [facility] called and will deliver a RW to his room prior to discharging home; Aneta Mins [phone]

## 2014-05-30 NOTE — Progress Notes (Signed)
Physical Therapy Treatment Patient Details Name: Anthony [name] MRN: [id number] DOB: 02-08-1946 Today's Date: 05/30/2014    History of Present Illness pt s/p L1-L5 ALIF    PT Comments    Has progressed well.  Should be at Independent level in the home.  All education reinforced another time and pt verbalized understanding.  Follow Up Recommendations  No PT follow up     Equipment Recommendations  Rolling walker with 5" wheels;Other (comment) (RW received)    Recommendations for Other Services       Precautions / Restrictions Precautions Precautions: Back Precaution Comments: Reviewed precautions with pt Required Braces or Orthoses: Spinal Brace Spinal Brace: Lumbar corset;Applied in sitting position Restrictions Weight Bearing Restrictions: No    Mobility  Bed Mobility Overal bed mobility: Needs Assistance Bed Mobility: Rolling;Sidelying to Sit Rolling: Supervision Sidelying to sit: Supervision       General bed mobility comments: Good technique, though used rail  Transfers Overall transfer level: Needs assistance Equipment used: Rolling walker (2 wheeled) Transfers: Sit to/from Stand Sit to Stand: Supervision         General transfer comment: Cues for hand placement  Ambulation/Gait Ambulation/Gait assistance: Supervision Ambulation Distance (Feet): 300 Feet Assistive device: Rolling walker (2 wheeled) Gait Pattern/deviations: WFL(Within Functional Limits) Gait velocity: slower   General Gait Details: steady and still slow   Stairs            Wheelchair Mobility    Modified Rankin (Stroke Patients Only)       Balance Overall balance assessment: No apparent balance deficits (not formally assessed)                                  Cognition Arousal/Alertness: Awake/alert Behavior During Therapy: WFL for tasks assessed/performed Overall Cognitive Status: Within Functional Limits for tasks assessed                       Exercises      General Comments        Pertinent Vitals/Pain     Home Living                      Prior Function            PT Goals (current goals can now be found in the care plan section) Acute Rehab PT Goals Patient Stated Goal: back to independence PT Goal Formulation: With patient Time For Goal Achievement: 06/05/14 Potential to Achieve Goals: Good Progress towards PT goals: Progressing toward goals    Frequency  Min 5X/week    PT Plan Current plan remains appropriate    Co-evaluation             End of Session Equipment Utilized During Treatment: Back brace Activity Tolerance: Patient tolerated treatment well Patient left: in chair;with call bell/phone within reach     Time: 1154-1207 PT Time Calculation (min): 13 min  Charges:  $Gait Training: 8-22 mins                    G Codes:      [employer] V 05/30/2014, 12:12 PM 05/30/2014  Donnella Sham, [place] [phone]  (pager)

## 2014-05-30 NOTE — Progress Notes (Signed)
Pt doing well. Pt given D/C instructions with Rx's. Pt verbalized understanding of D/C teaching. Pt received rolling walker from [facility] for home use prior to D/C. Pt D/C's home via wheelchair @ 1315 per MD order. Pt's IV was removed prior to D/C. Pt is stable @ D/C and has no other needs. Holli Humbles, RN

## 2014-05-31 LAB — GLUCOSE, CAPILLARY: Glucose-Capillary: 114 mg/dL — ABNORMAL HIGH (ref 70–99)

## 2014-06-04 ENCOUNTER — Encounter (HOSPITAL_COMMUNITY): Payer: Self-pay | Admitting: Neurosurgery

## 2014-06-05 ENCOUNTER — Other Ambulatory Visit: Payer: Self-pay | Admitting: Pulmonary Disease

## 2014-06-05 MED ORDER — TIOTROPIUM BROMIDE MONOHYDRATE 18 MCG IN CAPS: 18.0000 ug | ORAL_CAPSULE | Freq: Every day | RESPIRATORY_TRACT | Status: DC

## 2014-06-24 ENCOUNTER — Ambulatory Visit (INDEPENDENT_AMBULATORY_CARE_PROVIDER_SITE_OTHER): Payer: MEDICARE | Admitting: Pulmonary Disease

## 2014-06-24 ENCOUNTER — Encounter: Payer: Self-pay | Admitting: Pulmonary Disease

## 2014-06-24 VITALS — BP 132/74 | HR 77 | Ht 69.0 in | Wt 244.0 lb

## 2014-06-24 DIAGNOSIS — G4733 Obstructive sleep apnea (adult) (pediatric): Secondary | ICD-10-CM

## 2014-06-24 DIAGNOSIS — J449 Chronic obstructive pulmonary disease, unspecified: Secondary | ICD-10-CM

## 2014-06-24 DIAGNOSIS — J986 Disorders of diaphragm: Secondary | ICD-10-CM

## 2014-06-24 NOTE — Progress Notes (Signed)
Chief Complaint  Patient presents with  . Follow-up    rov, pt has no breathing complaints at this time.  Pt had back sx on 05/28/14.  Wearing cpap 8 hours nightly, no complaints with mask, supplies, or settings.     History of Present Illness: Anthony [name] is a 68 y.o. male former smoker with dyspnea 2nd to COPD, right hemidiaphragm paralysis, and OSA on CPAP 13 cm H2O.  Since his last visit he had surgery on L1 to L5 spine.  His pain is much better.  He still has to wear a belt, and has been walking with a cane.  His breathing has been doing well.  He uses his CPAP every night.  Tests: PFT 08/13/12>>FEV1 1.83 (62%), FEV1% 64, TLC 5.17 (82%), DLCO 67%, +BD SNIFF test 08/14/12>>Paradoxical motion of the right hemidiaphragm with inspiration.  PSG 08/14/12>>AHI 38.5.  CPAP 13 cm H2O with 1 liter oxygen. Echo 10/09/12>>mild LVH, EF 50 to 00%, grade 1 diastolic dysfx, mild LA dilation, PAS 35 mmHg CPAP 09/26/12 to 10/25/12>>Used on 30 of 30 nights with average 8 hrs 48 min. Average AHI 0.6 with CPAP 13 cm H2O. ONO with CPAP and RA 01/18/13 >> test time 10 hrs 37 min. Mean SpO2 94%, low SpO2 86%. Spent 16 sec with SpO2 < 88%.  He  has a past medical history of CAD (coronary artery disease); Lumbar stenosis; Diabetes mellitus; Hyperlipidemia; Congestive heart failure; COPD (chronic obstructive pulmonary disease) (08/11/2012); Chronic respiratory failure with hypoxia (08/11/2012); Peripheral vascular disease; and OSA (obstructive sleep apnea) (08/11/2012).  He  has past surgical history that includes Tonsillectomy (1965); Bunionectomy ([place]); Neuroma surgery (1996); Lumbar disc surgery (Dec 1997 & Ampril 2005); Cervical fusion (Nov 2002); Hammer toe surgery (June 2007 & August 2008); Carpal tunnel release (09/30/2006); Hand Arthroplasty (Oct 2010); Heart catherization (03/26/1999); Angioplasty (06/02/2000); Spine surgery (04/21/2004); Abdominal angiogram (Dec. 4, 2013); Cardiac catheterization;  Anterior lateral lumbar fusion 4 levels (Right, 05/28/2014); and Lumbar percutaneous pedicle screw 4 level (N/A, 05/28/2014).   Outpatient Encounter Prescriptions as of 06/24/2014  Medication Sig  . albuterol (PROAIR HFA) 108 (90 BASE) MCG/ACT inhaler Inhale 1-2 puffs into the lungs every 6 (six) hours as needed for wheezing or shortness of breath.  Marland Kitchen aspirin 325 MG tablet Take 325 mg by mouth daily.  . B Complex Vitamins (VITAMIN B COMPLEX PO) Take 1 tablet by mouth 2 (two) times daily.   . bisoprolol (ZEBETA) 5 MG tablet Take 5 mg by mouth daily.  Marland Kitchen CINNAMON PO Take 2 tablets by mouth daily.  Marland Kitchen doxazosin (CARDURA) 4 MG tablet Take 2 mg by mouth at bedtime.   Marland Kitchen doxycycline (ADOXA) 50 MG tablet Take 50 mg by mouth daily.  . fish oil-omega-3 fatty acids 1000 MG capsule Take 3 g by mouth daily.   . fluticasone (FLONASE) 50 MCG/ACT nasal spray Place 2 sprays into the nose at bedtime as needed for allergies.   . furosemide (LASIX) 20 MG tablet Take 20 mg by mouth daily.   Marland Kitchen GARLIC PO Take 1 tablet by mouth daily.  Marland Kitchen glipiZIDE (GLUCOTROL XL) 5 MG 24 hr tablet Take 5 mg by mouth daily.  . isosorbide mononitrate (IMDUR) 30 MG 24 hr tablet Take 1 tablet (30 mg total) by mouth daily.  Marland Kitchen L-Lysine 1000 MG TABS Take 1,000 mg by mouth daily.   Marland Kitchen losartan (COZAAR) 100 MG tablet Take 100 mg by mouth daily.  . metFORMIN (GLUCOPHAGE) 1000 MG tablet Take 1,000 mg by  mouth 2 (two) times daily with a meal.  . MISC NATURAL PRODUCTS PO Take 500 mg by mouth daily. TUMERIC capsules  . Multiple Vitamin (MULTIVITAMIN) tablet Take 1 tablet by mouth daily.  . pentoxifylline (TRENTAL) 400 MG CR tablet Take 1 tablet (400 mg total) by mouth 2 (two) times daily.  . potassium chloride SA (K-DUR,KLOR-CON) 20 MEQ tablet Take 20 mEq by mouth 2 (two) times daily.  Marland Kitchen rOPINIRole (REQUIP) 1 MG tablet Take 1 mg by mouth at bedtime.  . simvastatin (ZOCOR) 40 MG tablet Take 40 mg by mouth every evening.  . tiotropium (SPIRIVA) 18 MCG  inhalation capsule Place 18 mcg into inhaler and inhale every other day.  . [DISCONTINUED] tiotropium (SPIRIVA) 18 MCG inhalation capsule Place 1 capsule (18 mcg total) into inhaler and inhale daily.  . [DISCONTINUED] diazepam (VALIUM) 5 MG tablet Take 1 tablet (5 mg total) by mouth every 6 (six) hours as needed for muscle spasms.  . [DISCONTINUED] HYDROcodone-acetaminophen (NORCO) 10-325 MG per tablet Take 1 tablet by mouth every 6 (six) hours as needed (pain).  . [DISCONTINUED] HYDROcodone-acetaminophen (NORCO) 10-325 MG per tablet Take 1 tablet by mouth every 6 (six) hours as needed (pain).    No Known Allergies  Physical Exam:  General - No distress ENT - No sinus tenderness, no oral exudate, no LAN, MP 3 Cardiac - s1s2 regular, no murmur Chest - decreased breath sounds, prolonged exhalation, no wheeze/rales/dullness  Back - no focal tenderness  Abd - soft, non-tender Ext - no edema Neuro - normal strength Skin - no rashes Psych - normal mood, and behavior   Assessment/Plan:  [name], MD [facility] Pager:  [phone] 06/24/2014, 10:07 AM

## 2014-06-24 NOTE — Patient Instructions (Signed)
Will get copy of CPAP report Follow up in 6 months

## 2014-06-26 ENCOUNTER — Encounter (HOSPITAL_COMMUNITY): Payer: Self-pay | Admitting: Neurosurgery

## 2014-06-27 NOTE — Assessment & Plan Note (Signed)
Stable.  He is to continue spiriva and prn albuterol.

## 2014-06-27 NOTE — Assessment & Plan Note (Signed)
This is likely related to prior neck surgery with phrenic nerve injury.  Continue nocturnal CPAP.

## 2014-06-27 NOTE — Assessment & Plan Note (Signed)
He is compliant with therapy and reports benefit from CPAP.  Will get copy of his report and call him with results.

## 2014-07-06 ENCOUNTER — Telehealth: Payer: Self-pay | Admitting: Pulmonary Disease

## 2014-07-06 NOTE — Telephone Encounter (Signed)
CPAP 05/24/14 to 06/22/14 >> used on 29 of 30 nights with average 8 hrs and 34 min.  Average AHI is 0.3 with CPAP 13 cm H2O.  Will have my nurse inform pt that CPAP report looks excellent.  No change to current set up needed.

## 2014-07-09 NOTE — Telephone Encounter (Signed)
LMOM x 1 

## 2014-07-11 NOTE — Telephone Encounter (Signed)
lmtcb x2 

## 2014-07-12 NOTE — Telephone Encounter (Signed)
LMOM x3

## 2014-07-16 NOTE — Telephone Encounter (Signed)
Pt returned call & can be reached at [phone].  Anthony [name]

## 2014-07-16 NOTE — Telephone Encounter (Signed)
I spoke with patient about results and he verbalized understanding and had no questions 

## 2014-07-25 ENCOUNTER — Ambulatory Visit (INDEPENDENT_AMBULATORY_CARE_PROVIDER_SITE_OTHER): Payer: BC Managed Care – PPO | Admitting: Podiatrist

## 2014-07-25 DIAGNOSIS — B351 Tinea unguium: Secondary | ICD-10-CM

## 2014-07-25 DIAGNOSIS — M79609 Pain in unspecified limb: Secondary | ICD-10-CM

## 2014-07-25 DIAGNOSIS — I739 Peripheral vascular disease, unspecified: Secondary | ICD-10-CM

## 2014-07-25 DIAGNOSIS — M79673 Pain in unspecified foot: Secondary | ICD-10-CM

## 2014-07-26 NOTE — Progress Notes (Signed)
HPI: Patient presents today for follow up of foot and nail care. He has PVD and is being followed by Dr. Wells Brabham  Objective: Patients chart is reviewed. Neurovascular status unchanged with non palpable pedal pulses, delayed capillary refill time- He is being followed by Dr. Brabham. Patients nails are thickened, discolored, distrophic, friable and brittle with yellow-brown discoloration. Patient subjectively relates they are painful with shoes and with ambulation of bilateral feet.  Assessment: Symptomatic onychomycosis, PVD  Plan: Discussed treatment options and alternatives. The symptomatic toenails were debrided through manual an mechanical means without complication. He will be seen back in 3 months or as needed for followup. If any problems or concerns arise he will call.       

## 2014-08-01 ENCOUNTER — Ambulatory Visit: Payer: BC Managed Care – PPO | Admitting: Podiatrist

## 2014-08-23 ENCOUNTER — Encounter: Payer: Self-pay | Admitting: Surgery

## 2014-08-26 ENCOUNTER — Ambulatory Visit (INDEPENDENT_AMBULATORY_CARE_PROVIDER_SITE_OTHER): Payer: MEDICARE | Admitting: Surgery

## 2014-08-26 ENCOUNTER — Ambulatory Visit (HOSPITAL_COMMUNITY)
Admission: RE | Admit: 2014-08-26 | Discharge: 2014-08-26 | Disposition: A | Discharge status: Home / Self Care | Payer: MEDICARE | Source: Ambulatory Visit | Attending: Surgery | Admitting: Surgery

## 2014-08-26 ENCOUNTER — Encounter: Payer: Self-pay | Admitting: Surgery

## 2014-08-26 ENCOUNTER — Other Ambulatory Visit: Payer: Self-pay

## 2014-08-26 VITALS — BP 111/58 | HR 68 | Temp 98.2°F | Resp 16 | Ht 69.0 in | Wt 250.0 lb

## 2014-08-26 DIAGNOSIS — M79609 Pain in unspecified limb: Secondary | ICD-10-CM

## 2014-08-26 DIAGNOSIS — I10 Essential (primary) hypertension: Secondary | ICD-10-CM | POA: Insufficient documentation

## 2014-08-26 DIAGNOSIS — I739 Peripheral vascular disease, unspecified: Secondary | ICD-10-CM | POA: Insufficient documentation

## 2014-08-26 DIAGNOSIS — E119 Type 2 diabetes mellitus without complications: Secondary | ICD-10-CM | POA: Diagnosis not present

## 2014-08-26 NOTE — Progress Notes (Signed)
Patient name: Anthony [name] MRN: [id number] DOB: [birth year] Sex: male     Chief Complaint  Patient presents with  . PVD    1 year follow up on pvd. pt c/o increased pain in BLE,  R>L       Pt had 4 level fusion by Dr. Vertell Limber    HISTORY OF PRESENT ILLNESS: The patient is back today for followup of his claudication, right leg greater than left.  He has previously undergone angiography which revealed severe disease within his right common femoral and right popliteal artery, behind the knee.  I have tried to manage him conservatively.  He could not tolerate Pletal secondary to GI side effects.  Trental was not very helpful.  He states that over the past year his symptoms have progressed.  He can now only walk about 40 feet before he gets cramping within his calf which takes approximately 15 minutes of rest to resolve.  He denies rest pain or nonhealing wounds.  The patient is medically managed for hypercholesterolemia with a statin.  He is a type II diabetic.  His most recent hemoglobin A1c was in the 5.9 range.  His blood pressure is managed with a ARB.  He has not smoked since 1989  Past Medical History  Diagnosis Date  . CAD (coronary artery disease)     stent placed 06/02/2000  . Lumbar stenosis   . Diabetes mellitus   . Hyperlipidemia   . Congestive heart failure   . COPD (chronic obstructive pulmonary disease) 08/11/2012  . Chronic respiratory failure with hypoxia 08/11/2012  . Peripheral vascular disease   . OSA (obstructive sleep apnea) 08/11/2012    cpcap    Past Surgical History  Procedure Laterality Date  . Tonsillectomy  1965  . [place]    right foot  . Neuroma surgery  1996    right foot  . Lumbar disc surgery  Dec 1997 & Ampril 2005  . Cervical fusion  Nov 2002  . Hammer toe surgery  June 2007 & August 2008    left foot  . Carpal tunnel release  09/30/2006    right wrist  . Hand arthroplasty  Oct 2010    right thumb  . Heart catherization   03/26/1999  . Angioplasty  06/02/2000    Stent  . Spine surgery  04/21/2004    Lower back disk gurgery- Lumbar stenosis  . Abdominal angiogram  Dec. 4, 2013  . Cardiac catheterization    . Anterior lateral lumbar fusion 4 levels Right 05/28/2014    Procedure: Right L4-5 L3-4 L2-3, L1-2  Anterior lateral lumbar fusion with percutaneaous pedicle screws. Lumbar four/five, three/four, two/three and possible two/one;  Surgeon: Erline Levine, MD;  Location: [facility];  Service: Neurosurgery;  Laterality: Right;  Lumbar One-Five Fusion with Percutaneous Screws  . Lumbar percutaneous pedicle screw 4 level N/A 05/28/2014    Procedure: LUMBAR PERCUTANEOUS PEDICLE SCREW 4 LEVEL;  Surgeon: Erline Levine, MD;  Location: [facility];  Service: Neurosurgery;  Laterality: N/A;    History   Social History  . Marital Status: Married    Spouse Name: N/A    Number of Children: N/A  . Years of Education: N/A   Occupational History  . retired    Social History Main Topics  . Smoking status: Former Smoker -- 2.00 packs/day for 28 years    Types: Cigarettes    Quit date: 08/03/1987  . Smokeless tobacco: Never Used  .  Alcohol Use: Yes     Comment: weekly  . Drug Use: No  . Sexual Activity: Not on file   Other Topics Concern  . Not on file   Social History Narrative  . No narrative on file    Family History  Problem Relation Age of Onset  . Heart disease Father 85  . Cancer Father   . Hyperlipidemia Father   . Hypertension Father   . Heart attack Father   . Cancer Mother   . Deep vein thrombosis Mother     Varicose veins  . Diabetes Mother   . Hyperlipidemia Mother   . Hypertension Mother     Allergies as of 08/26/2014  . (No Known Allergies)    Current Outpatient Prescriptions on File Prior to Visit  Medication Sig Dispense Refill  . albuterol (PROAIR HFA) 108 (90 BASE) MCG/ACT inhaler Inhale 1-2 puffs into the lungs every 6 (six) hours as needed for wheezing or shortness of breath.  3  Inhaler  3  . aspirin 325 MG tablet Take 325 mg by mouth daily.      . B Complex Vitamins (VITAMIN B COMPLEX PO) Take 1 tablet by mouth 2 (two) times daily.       . bisoprolol (ZEBETA) 5 MG tablet Take 5 mg by mouth daily.      Marland Kitchen CINNAMON PO Take 2 tablets by mouth daily.      Marland Kitchen doxazosin (CARDURA) 4 MG tablet Take 2 mg by mouth at bedtime.       Marland Kitchen doxycycline (ADOXA) 50 MG tablet Take 50 mg by mouth daily.      . fish oil-omega-3 fatty acids 1000 MG capsule Take 3 g by mouth daily.       . fluticasone (FLONASE) 50 MCG/ACT nasal spray Place 2 sprays into the nose at bedtime as needed for allergies.       . furosemide (LASIX) 20 MG tablet Take 20 mg by mouth daily.       Marland Kitchen GARLIC PO Take 1 tablet by mouth daily.      Marland Kitchen glipiZIDE (GLUCOTROL XL) 5 MG 24 hr tablet Take 5 mg by mouth daily.      . isosorbide mononitrate (IMDUR) 30 MG 24 hr tablet Take 1 tablet (30 mg total) by mouth daily.  90 tablet  3  . L-Lysine 1000 MG TABS Take 1,000 mg by mouth daily.       Marland Kitchen losartan (COZAAR) 100 MG tablet Take 100 mg by mouth daily.      . metFORMIN (GLUCOPHAGE) 1000 MG tablet Take 1,000 mg by mouth 2 (two) times daily with a meal.      . MISC NATURAL PRODUCTS PO Take 500 mg by mouth daily. TUMERIC capsules      . Multiple Vitamin (MULTIVITAMIN) tablet Take 1 tablet by mouth daily.      . pentoxifylline (TRENTAL) 400 MG CR tablet Take 1 tablet (400 mg total) by mouth 2 (two) times daily.  180 tablet  2  . potassium chloride SA (K-DUR,KLOR-CON) 20 MEQ tablet Take 20 mEq by mouth 2 (two) times daily.      Marland Kitchen rOPINIRole (REQUIP) 1 MG tablet Take 1 mg by mouth at bedtime.      . simvastatin (ZOCOR) 40 MG tablet Take 40 mg by mouth every evening.      . tiotropium (SPIRIVA) 18 MCG inhalation capsule Place 18 mcg into inhaler and inhale every other day.       No  current facility-administered medications on file prior to visit.     REVIEW OF SYSTEMS: Please see history of present illness, otherwise  negative  PHYSICAL EXAMINATION:   Vital signs are BP 111/58  Pulse 68  Temp(Src) 98.2 F (36.8 C) (Oral)  Resp 16  Ht 5\' 9"  (1.753 m)  Wt 250 lb (113.399 kg)  BMI 36.90 kg/m2  SpO2 99% General: The patient appears their stated age. HEENT:  No gross abnormalities Pulmonary:  Non labored breathing Abdomen: Soft and non-tender Musculoskeletal: There are no major deformities. Neurologic: No focal weakness or paresthesias are detected, Skin: There are no ulcer or rashes noted. Psychiatric: The patient has normal affect. Cardiovascular: There is a regular rate and rhythm without significant murmur appreciated.  No carotid bruits.   Diagnostic Studies ABIs on the right had decreased.  Today they are 0.36.  One year ago it was 0.64.  On the left there remained stable at 0.86  Assessment: Bilateral claudication, right greater than left Plan: The patient's symptoms have progressed to the point where he cannot tolerate his disability.  I feel the next step is to repeat his arteriogram to determine our surgical options.  We discussed possibly just treating the right groin vs. below-knee bypass.  This will depend on the angiographic findings.  He'll need cardiac clearance before surgery.  He will also be carotid Dopplers and vein mapping performed if we proceed with surgery.  Again this will depend on the results of his arteriogram which has been scheduled for Tuesday, September 8  V. Leia Alf, M.D. [facility] Office: [phone] Pager:  [phone]

## 2014-08-28 ENCOUNTER — Other Ambulatory Visit (HOSPITAL_COMMUNITY): Payer: Self-pay

## 2014-08-28 MED ORDER — BISOPROLOL FUMARATE 5 MG PO TABS: 5.0000 mg | ORAL_TABLET | Freq: Every day | ORAL | Status: DC

## 2014-08-29 ENCOUNTER — Other Ambulatory Visit (HOSPITAL_COMMUNITY): Payer: Self-pay

## 2014-08-30 ENCOUNTER — Encounter (HOSPITAL_COMMUNITY): Payer: Self-pay | Admitting: Pharmacy Technician

## 2014-09-02 MED ORDER — SODIUM CHLORIDE 0.9 % IV SOLN
INTRAVENOUS | Status: DC
  Administered 2014-09-03: 1000 mL via INTRAVENOUS

## 2014-09-03 ENCOUNTER — Encounter (HOSPITAL_COMMUNITY)
Admission: RE | Disposition: A | Discharge status: Home / Self Care | Payer: Self-pay | Source: Ambulatory Visit | Attending: Surgery

## 2014-09-03 ENCOUNTER — Ambulatory Visit (HOSPITAL_COMMUNITY)
Admission: RE | Admit: 2014-09-03 | Discharge: 2014-09-03 | Disposition: A | Discharge status: Home / Self Care | Payer: MEDICARE | Source: Ambulatory Visit | Attending: Surgery | Admitting: Surgery

## 2014-09-03 DIAGNOSIS — I509 Heart failure, unspecified: Secondary | ICD-10-CM | POA: Diagnosis not present

## 2014-09-03 DIAGNOSIS — J449 Chronic obstructive pulmonary disease, unspecified: Secondary | ICD-10-CM | POA: Insufficient documentation

## 2014-09-03 DIAGNOSIS — Z9861 Coronary angioplasty status: Secondary | ICD-10-CM | POA: Diagnosis not present

## 2014-09-03 DIAGNOSIS — I70219 Atherosclerosis of native arteries of extremities with intermittent claudication, unspecified extremity: Secondary | ICD-10-CM | POA: Insufficient documentation

## 2014-09-03 DIAGNOSIS — Z792 Long term (current) use of antibiotics: Secondary | ICD-10-CM | POA: Diagnosis not present

## 2014-09-03 DIAGNOSIS — E119 Type 2 diabetes mellitus without complications: Secondary | ICD-10-CM | POA: Insufficient documentation

## 2014-09-03 DIAGNOSIS — J9610 Chronic respiratory failure, unspecified whether with hypoxia or hypercapnia: Secondary | ICD-10-CM | POA: Insufficient documentation

## 2014-09-03 DIAGNOSIS — I2510 Atherosclerotic heart disease of native coronary artery without angina pectoris: Secondary | ICD-10-CM | POA: Diagnosis not present

## 2014-09-03 DIAGNOSIS — J4489 Other specified chronic obstructive pulmonary disease: Secondary | ICD-10-CM | POA: Insufficient documentation

## 2014-09-03 DIAGNOSIS — G4733 Obstructive sleep apnea (adult) (pediatric): Secondary | ICD-10-CM | POA: Insufficient documentation

## 2014-09-03 DIAGNOSIS — Z7982 Long term (current) use of aspirin: Secondary | ICD-10-CM | POA: Diagnosis not present

## 2014-09-03 DIAGNOSIS — Z87891 Personal history of nicotine dependence: Secondary | ICD-10-CM | POA: Insufficient documentation

## 2014-09-03 DIAGNOSIS — E7800 Pure hypercholesterolemia, unspecified: Secondary | ICD-10-CM | POA: Diagnosis not present

## 2014-09-03 HISTORY — PX: ABDOMINAL AORTAGRAM: SHX5454

## 2014-09-03 LAB — GLUCOSE, CAPILLARY
Glucose-Capillary: 163 mg/dL — ABNORMAL HIGH (ref 70–99)
Glucose-Capillary: 132 mg/dL — ABNORMAL HIGH (ref 70–99)

## 2014-09-03 LAB — POCT I-STAT, CHEM 8
BUN: 22 mg/dL (ref 6–23)
Calcium, Ion: 1.15 mmol/L (ref 1.13–1.30)
Chloride: 109 mEq/L (ref 96–112)
Creatinine, Ser: 1.1 mg/dL (ref 0.50–1.35)
Glucose, Bld: 169 mg/dL — ABNORMAL HIGH (ref 70–99)
HCT: 41 % (ref 39.0–52.0)
Hemoglobin: 13.9 g/dL (ref 13.0–17.0)
Potassium: 4.3 mEq/L (ref 3.7–5.3)
Sodium: 139 mEq/L (ref 137–147)
TCO2: 23 mmol/L (ref 0–100)

## 2014-09-03 SURGERY — ABDOMINAL AORTAGRAM
Anesthesia: LOCAL | Laterality: Right

## 2014-09-03 MED ORDER — HEPARIN (PORCINE) IN NACL 2-0.9 UNIT/ML-% IJ SOLN
INTRAMUSCULAR | Status: AC
  Filled 2014-09-03: qty 1000

## 2014-09-03 MED ORDER — PHENOL 1.4 % MT LIQD
1.0000 | OROMUCOSAL | Status: DC | PRN
  Filled 2014-09-03: qty 177

## 2014-09-03 MED ORDER — ACETAMINOPHEN 325 MG RE SUPP
325.0000 mg | RECTAL | Status: DC | PRN
Start: 2014-09-03 — End: 2014-09-03
  Filled 2014-09-03: qty 2

## 2014-09-03 MED ORDER — SODIUM CHLORIDE 0.9 % IV SOLN: INTRAVENOUS | Status: DC

## 2014-09-03 MED ORDER — HYDRALAZINE HCL 20 MG/ML IJ SOLN: 10.0000 mg | INTRAMUSCULAR | Status: DC | PRN

## 2014-09-03 MED ORDER — METOPROLOL TARTRATE 1 MG/ML IV SOLN: 2.0000 mg | INTRAVENOUS | Status: DC | PRN

## 2014-09-03 MED ORDER — ACETAMINOPHEN 325 MG PO TABS
325.0000 mg | ORAL_TABLET | ORAL | Status: DC | PRN
  Filled 2014-09-03: qty 2

## 2014-09-03 MED ORDER — ALUM & MAG HYDROXIDE-SIMETH 200-200-20 MG/5ML PO SUSP
15.0000 mL | ORAL | Status: DC | PRN
  Filled 2014-09-03: qty 30

## 2014-09-03 MED ORDER — MIDAZOLAM HCL 2 MG/2ML IJ SOLN
INTRAMUSCULAR | Status: AC
  Filled 2014-09-03: qty 2

## 2014-09-03 MED ORDER — ONDANSETRON HCL 4 MG/2ML IJ SOLN: 4.0000 mg | Freq: Four times a day (QID) | INTRAMUSCULAR | Status: DC | PRN

## 2014-09-03 MED ORDER — GUAIFENESIN-DM 100-10 MG/5ML PO SYRP
15.0000 mL | ORAL_SOLUTION | ORAL | Status: DC | PRN
  Filled 2014-09-03: qty 15

## 2014-09-03 MED ORDER — FENTANYL CITRATE 0.05 MG/ML IJ SOLN
INTRAMUSCULAR | Status: AC
  Filled 2014-09-03: qty 2

## 2014-09-03 MED ORDER — LIDOCAINE HCL (PF) 1 % IJ SOLN
INTRAMUSCULAR | Status: AC
  Filled 2014-09-03: qty 30

## 2014-09-03 MED ORDER — LABETALOL HCL 5 MG/ML IV SOLN: 10.0000 mg | INTRAVENOUS | Status: DC | PRN

## 2014-09-03 SURGICAL SUPPLY — 53 items
BANDAGE ELASTIC 4 VELCRO ST LF (GAUZE/BANDAGES/DRESSINGS) IMPLANT
BANDAGE ESMARK 6X9 LF (GAUZE/BANDAGES/DRESSINGS) IMPLANT
BNDG ESMARK 6X9 LF (GAUZE/BANDAGES/DRESSINGS)
CANISTER SUCTION 2500CC (MISCELLANEOUS) ×3 IMPLANT
CLIP TI MEDIUM 24 (CLIP) ×3 IMPLANT
CLIP TI WIDE RED SMALL 24 (CLIP) ×3 IMPLANT
COVER SURGICAL LIGHT HANDLE (MISCELLANEOUS) ×3 IMPLANT
CUFF TOURNIQUET SINGLE 24IN (TOURNIQUET CUFF) IMPLANT
CUFF TOURNIQUET SINGLE 34IN LL (TOURNIQUET CUFF) IMPLANT
CUFF TOURNIQUET SINGLE 44IN (TOURNIQUET CUFF) IMPLANT
DERMABOND ADVANCED (GAUZE/BANDAGES/DRESSINGS) ×1
DERMABOND ADVANCED .7 DNX12 (GAUZE/BANDAGES/DRESSINGS) ×2 IMPLANT
DRAIN CHANNEL 15F RND FF W/TCR (WOUND CARE) IMPLANT
DRAPE WARM FLUID 44X44 (DRAPE) ×3 IMPLANT
DRAPE X-RAY CASS 24X20 (DRAPES) IMPLANT
DRSG COVADERM 4X10 (GAUZE/BANDAGES/DRESSINGS) IMPLANT
DRSG COVADERM 4X8 (GAUZE/BANDAGES/DRESSINGS) IMPLANT
ELECT REM PT RETURN 9FT ADLT (ELECTROSURGICAL) ×3
ELECTRODE REM PT RTRN 9FT ADLT (ELECTROSURGICAL) ×2 IMPLANT
EVACUATOR SILICONE 100CC (DRAIN) IMPLANT
GLOVE BIOGEL PI IND STRL 7.5 (GLOVE) ×2 IMPLANT
GLOVE BIOGEL PI INDICATOR 7.5 (GLOVE) ×1
GLOVE SURG SS PI 7.5 STRL IVOR (GLOVE) ×3 IMPLANT
GOWN PREVENTION PLUS XXLARGE (GOWN DISPOSABLE) ×3 IMPLANT
GOWN STRL NON-REIN LRG LVL3 (GOWN DISPOSABLE) ×9 IMPLANT
HEMOSTAT SNOW SURGICEL 2X4 (HEMOSTASIS) IMPLANT
KIT BASIN OR (CUSTOM PROCEDURE TRAY) ×3 IMPLANT
KIT ROOM TURNOVER OR (KITS) ×3 IMPLANT
MARKER GRAFT CORONARY BYPASS (MISCELLANEOUS) IMPLANT
NS IRRIG 1000ML POUR BTL (IV SOLUTION) ×6 IMPLANT
PACK PERIPHERAL VASCULAR (CUSTOM PROCEDURE TRAY) ×3 IMPLANT
PAD ARMBOARD 7.5X6 YLW CONV (MISCELLANEOUS) ×6 IMPLANT
PADDING CAST COTTON 6X4 STRL (CAST SUPPLIES) IMPLANT
SET COLLECT BLD 21X3/4 12 (NEEDLE) IMPLANT
STOPCOCK 4 WAY LG BORE MALE ST (IV SETS) IMPLANT
SUT ETHILON 3 0 PS 1 (SUTURE) IMPLANT
SUT PROLENE 5 0 C 1 24 (SUTURE) ×3 IMPLANT
SUT PROLENE 6 0 BV (SUTURE) ×3 IMPLANT
SUT PROLENE 7 0 BV 1 (SUTURE) IMPLANT
SUT SILK 2 0 SH (SUTURE) ×3 IMPLANT
SUT SILK 3 0 (SUTURE)
SUT SILK 3-0 18XBRD TIE 12 (SUTURE) IMPLANT
SUT VIC AB 2-0 CT1 27 (SUTURE) ×2
SUT VIC AB 2-0 CT1 TAPERPNT 27 (SUTURE) ×4 IMPLANT
SUT VIC AB 3-0 SH 27 (SUTURE) ×2
SUT VIC AB 3-0 SH 27X BRD (SUTURE) ×4 IMPLANT
SUT VICRYL 4-0 PS2 18IN ABS (SUTURE) ×6 IMPLANT
TOWEL OR 17X24 6PK STRL BLUE (TOWEL DISPOSABLE) ×6 IMPLANT
TOWEL OR 17X26 10 PK STRL BLUE (TOWEL DISPOSABLE) ×6 IMPLANT
TRAY FOLEY CATH 16FRSI W/METER (SET/KITS/TRAYS/PACK) ×3 IMPLANT
TUBING EXTENTION W/L.L. (IV SETS) IMPLANT
UNDERPAD 30X30 INCONTINENT (UNDERPADS AND DIAPERS) ×3 IMPLANT
WATER STERILE IRR 1000ML POUR (IV SOLUTION) ×3 IMPLANT

## 2014-09-03 NOTE — Interval H&P Note (Signed)
History and Physical Interval Note:  09/03/2014 8:50 AM  Anthony [name]  has presented today for surgery, with the diagnosis of PVD with claudication  The various methods of treatment have been discussed with the patient and family. After consideration of risks, benefits and other options for treatment, the patient has consented to  Procedure(s): ABDOMINAL AORTAGRAM (N/A) as a surgical intervention .  The patient's history has been reviewed, patient examined, no change in status, stable for surgery.  I have reviewed the patient's chart and labs.  Questions were answered to the patient's satisfaction.     Anthony [name], Anthony [name]  No interval change has occurred since I last saw the patient in the office.  Anthony [name]

## 2014-09-03 NOTE — Discharge Instructions (Signed)
Angiogram, Care After °Refer to this sheet in the next few weeks. These instructions provide you with information on caring for yourself after your procedure. Your health care provider may also give you more specific instructions. Your treatment has been planned according to current medical practices, but problems sometimes occur. Call your health care provider if you have any problems or questions after your procedure.  °WHAT TO EXPECT AFTER THE PROCEDURE °After your procedure, it is typical to have the following sensations: °· Minor discomfort or tenderness and a small bump at the catheter insertion site. The bump should usually decrease in size and tenderness within 1 to 2 weeks. °· Any bruising will usually fade within 2 to 4 weeks. °HOME CARE INSTRUCTIONS  °· You may need to keep taking blood thinners if they were prescribed for you. Take medicines only as directed by your health care provider. °· Do not apply powder or lotion to the site. °· Do not take baths, swim, or use a hot tub until your health care provider approves. °· You may shower 24 hours after the procedure. Remove the bandage (dressing) and gently wash the site with plain soap and water. Gently pat the site dry. °· Inspect the site at least twice daily. °· Limit your activity for the first 48 hours. Do not bend, squat, or lift anything over 20 lb (9 kg) or as directed by your health care provider. °· Plan to have someone take you home after the procedure. Follow instructions about when you can drive or return to work. °SEEK MEDICAL CARE IF: °· You get light-headed when standing up. °· You have drainage (other than a small amount of blood on the dressing). °· You have chills. °· You have a fever. °· You have redness, warmth, swelling, or pain at the insertion site. °SEEK IMMEDIATE MEDICAL CARE IF:  °· You develop chest pain or shortness of breath, feel faint, or pass out. °· You have bleeding, swelling larger than a walnut, or drainage from the  catheter insertion site. °· You develop pain, discoloration, coldness, or severe bruising in the leg or arm that held the catheter. °· You have heavy bleeding from the site. If this happens, hold pressure on the site and call 911. °MAKE SURE YOU: °· Understand these instructions. °· Will watch your condition. °· Will get help right away if you are not doing well or get worse. °Document Released: 07/01/2005 Document Revised: 04/29/2014 Document Reviewed: 05/07/2013 °ExitCare® Patient Information ©2015 [employer]. This information is not intended to replace advice given to you by your health care provider. Make sure you discuss any questions you have with your health care provider. ° °

## 2014-09-03 NOTE — Op Note (Signed)
    Patient name: Anthony [name] MRN: [id number] DOB: [birth year] Sex: male  09/03/2014 Pre-operative Diagnosis: Left leg claudication Post-operative diagnosis:  Same Surgeon:  Eldridge Abrahams Procedure Performed:  1.  ultrasound-guided access, left femoral artery  2.  abdominal aortogram  3.  second order catheterization  4.  right lower extremity runoff   Indications:  This is a 68 year old gentleman who has been managed for bilateral claudication, right greater than left.  He has developed progressive symptoms in the right leg with a significant drop in his ankle-brachial index.  He comes in today for further evaluation.  Procedure:  The patient was identified in the holding area and taken to room 8.  The patient was then placed supine on the table and prepped and draped in the usual sterile fashion.  A time out was called.  Ultrasound was used to evaluate the left common femoral artery.  It was patent .  A digital ultrasound image was acquired.  A micropuncture needle was used to access the left common femoral artery under ultrasound guidance.  An 018 wire was advanced without resistance and a micropuncture sheath was placed.  The 018 wire was removed and a benson wire was placed.  The micropuncture sheath was exchanged for a 5 french sheath.  An omniflush catheter was advanced over the wire to the level of L-1.  An abdominal angiogram was obtained.  Next, using the omniflush catheter and a benson wire, the aortic bifurcation was crossed and the catheter was placed into theright external iliac artery and right runoff was obtained.    Findings:   Aortogram:  The visualized portions of the suprarenal abdominal aorta showed no significant stenosis.  There is no evidence of renal artery stenosis.  The infrarenal abdominal aorta is calcified but patent throughout it's course.  Bilateral common and external iliac arteries are widely patent.  Right Lower Extremity:  Severe or calcification with  near occlusion of the right common femoral artery is noted.  There is also significant calcification with some high-grade stenosis extending into the proximal superficial femoral artery.  The profunda femoral artery is patent.  There are several areas of calcification with greater than 50% stenosis throughout the superficial femoral artery and near occlusion of the popliteal artery.  The posterior tibial artery is the dominant runoff, but all 3 tibial vessels appeared to be patent.  The peroneal artery appears to fill retrograde but the entire artery is visualized.  Left Lower Extremity:  Not evaluated today given amount of contrast utilized to visualize the right leg  Intervention:  None  Impression:  #1  progression of the stenosis within the right common femoral and proximal superficial femoral artery  #2  diffuse disease throughout the right superficial femoral artery with near occlusion/occlusion of the popliteal artery behind the joint space  #3  three-vessel runoff of the right leg    V. Annamarie Major, M.D. [facility] Office: [phone] Pager:  [phone]

## 2014-09-03 NOTE — Progress Notes (Signed)
Site area: Left Groin Site Prior to Removal:  Level 0 Pressure Applied For: 20 minutes by Maryjean Morn, RN Manual:   yes Patient Status During Pull:  Stable Post Pull Site:  Level 0 Post Pull Instructions Given:  yes Post Pull Pulses Present: yes and remained present with doppler during sheath pull Dressing Applied:  Tegaderm Bedrest begins @ 1055 Comments:No complications

## 2014-09-03 NOTE — H&P (View-Only) (Signed)
Patient name: Anthony [name] MRN: [id number] DOB: 1946/06/01 Sex: male     Chief Complaint  Patient presents with  . PVD    1 year follow up on pvd. pt c/o increased pain in BLE,  R>L       Pt had 4 level fusion by Dr. Vertell Limber    HISTORY OF PRESENT ILLNESS: The patient is back today for followup of his claudication, right leg greater than left.  He has previously undergone angiography which revealed severe disease within his right common femoral and right popliteal artery, behind the knee.  I have tried to manage him conservatively.  He could not tolerate Pletal secondary to GI side effects.  Trental was not very helpful.  He states that over the past year his symptoms have progressed.  He can now only walk about 40 feet before he gets cramping within his calf which takes approximately 15 minutes of rest to resolve.  He denies rest pain or nonhealing wounds.  The patient is medically managed for hypercholesterolemia with a statin.  He is a type II diabetic.  His most recent hemoglobin A1c was in the 5.9 range.  His blood pressure is managed with a ARB.  He has not smoked since 1989  Past Medical History  Diagnosis Date  . CAD (coronary artery disease)     stent placed 06/02/2000  . Lumbar stenosis   . Diabetes mellitus   . Hyperlipidemia   . Congestive heart failure   . COPD (chronic obstructive pulmonary disease) 08/11/2012  . Chronic respiratory failure with hypoxia 08/11/2012  . Peripheral vascular disease   . OSA (obstructive sleep apnea) 08/11/2012    cpcap    Past Surgical History  Procedure Laterality Date  . Tonsillectomy  1965  . [place]    right foot  . Neuroma surgery  1996    right foot  . Lumbar disc surgery  Dec 1997 & Ampril 2005  . Cervical fusion  Nov 2002  . Hammer toe surgery  June 2007 & August 2008    left foot  . Carpal tunnel release  09/30/2006    right wrist  . Hand arthroplasty  Oct 2010    right thumb  . Heart catherization   03/26/1999  . Angioplasty  06/02/2000    Stent  . Spine surgery  04/21/2004    Lower back disk gurgery- Lumbar stenosis  . Abdominal angiogram  Dec. 4, 2013  . Cardiac catheterization    . Anterior lateral lumbar fusion 4 levels Right 05/28/2014    Procedure: Right L4-5 L3-4 L2-3, L1-2  Anterior lateral lumbar fusion with percutaneaous pedicle screws. Lumbar four/five, three/four, two/three and possible two/one;  Surgeon: Erline Levine, MD;  Location: [facility];  Service: Neurosurgery;  Laterality: Right;  Lumbar One-Five Fusion with Percutaneous Screws  . Lumbar percutaneous pedicle screw 4 level N/A 05/28/2014    Procedure: LUMBAR PERCUTANEOUS PEDICLE SCREW 4 LEVEL;  Surgeon: Erline Levine, MD;  Location: [facility];  Service: Neurosurgery;  Laterality: N/A;    History   Social History  . Marital Status: Married    Spouse Name: N/A    Number of Children: N/A  . Years of Education: N/A   Occupational History  . retired    Social History Main Topics  . Smoking status: Former Smoker -- 2.00 packs/day for 28 years    Types: Cigarettes    Quit date: 08/03/1987  . Smokeless tobacco: Never Used  .  Alcohol Use: Yes     Comment: weekly  . Drug Use: No  . Sexual Activity: Not on file   Other Topics Concern  . Not on file   Social History Narrative  . No narrative on file    Family History  Problem Relation Age of Onset  . Heart disease Father 11  . Cancer Father   . Hyperlipidemia Father   . Hypertension Father   . Heart attack Father   . Cancer Mother   . Deep vein thrombosis Mother     Varicose veins  . Diabetes Mother   . Hyperlipidemia Mother   . Hypertension Mother     Allergies as of 08/26/2014  . (No Known Allergies)    Current Outpatient Prescriptions on File Prior to Visit  Medication Sig Dispense Refill  . albuterol (PROAIR HFA) 108 (90 BASE) MCG/ACT inhaler Inhale 1-2 puffs into the lungs every 6 (six) hours as needed for wheezing or shortness of breath.  3  Inhaler  3  . aspirin 325 MG tablet Take 325 mg by mouth daily.      . B Complex Vitamins (VITAMIN B COMPLEX PO) Take 1 tablet by mouth 2 (two) times daily.       . bisoprolol (ZEBETA) 5 MG tablet Take 5 mg by mouth daily.      Marland Kitchen CINNAMON PO Take 2 tablets by mouth daily.      Marland Kitchen doxazosin (CARDURA) 4 MG tablet Take 2 mg by mouth at bedtime.       Marland Kitchen doxycycline (ADOXA) 50 MG tablet Take 50 mg by mouth daily.      . fish oil-omega-3 fatty acids 1000 MG capsule Take 3 g by mouth daily.       . fluticasone (FLONASE) 50 MCG/ACT nasal spray Place 2 sprays into the nose at bedtime as needed for allergies.       . furosemide (LASIX) 20 MG tablet Take 20 mg by mouth daily.       Marland Kitchen GARLIC PO Take 1 tablet by mouth daily.      Marland Kitchen glipiZIDE (GLUCOTROL XL) 5 MG 24 hr tablet Take 5 mg by mouth daily.      . isosorbide mononitrate (IMDUR) 30 MG 24 hr tablet Take 1 tablet (30 mg total) by mouth daily.  90 tablet  3  . L-Lysine 1000 MG TABS Take 1,000 mg by mouth daily.       Marland Kitchen losartan (COZAAR) 100 MG tablet Take 100 mg by mouth daily.      . metFORMIN (GLUCOPHAGE) 1000 MG tablet Take 1,000 mg by mouth 2 (two) times daily with a meal.      . MISC NATURAL PRODUCTS PO Take 500 mg by mouth daily. TUMERIC capsules      . Multiple Vitamin (MULTIVITAMIN) tablet Take 1 tablet by mouth daily.      . pentoxifylline (TRENTAL) 400 MG CR tablet Take 1 tablet (400 mg total) by mouth 2 (two) times daily.  180 tablet  2  . potassium chloride SA (K-DUR,KLOR-CON) 20 MEQ tablet Take 20 mEq by mouth 2 (two) times daily.      Marland Kitchen rOPINIRole (REQUIP) 1 MG tablet Take 1 mg by mouth at bedtime.      . simvastatin (ZOCOR) 40 MG tablet Take 40 mg by mouth every evening.      . tiotropium (SPIRIVA) 18 MCG inhalation capsule Place 18 mcg into inhaler and inhale every other day.       No  current facility-administered medications on file prior to visit.     REVIEW OF SYSTEMS: Please see history of present illness, otherwise  negative  PHYSICAL EXAMINATION:   Vital signs are BP 111/58  Pulse 68  Temp(Src) 98.2 F (36.8 C) (Oral)  Resp 16  Ht 5\' 9"  (1.753 m)  Wt 250 lb (113.399 kg)  BMI 36.90 kg/m2  SpO2 99% General: The patient appears their stated age. HEENT:  No gross abnormalities Pulmonary:  Non labored breathing Abdomen: Soft and non-tender Musculoskeletal: There are no major deformities. Neurologic: No focal weakness or paresthesias are detected, Skin: There are no ulcer or rashes noted. Psychiatric: The patient has normal affect. Cardiovascular: There is a regular rate and rhythm without significant murmur appreciated.  No carotid bruits.   Diagnostic Studies ABIs on the right had decreased.  Today they are 0.36.  One year ago it was 0.64.  On the left there remained stable at 0.86  Assessment: Bilateral claudication, right greater than left Plan: The patient's symptoms have progressed to the point where he cannot tolerate his disability.  I feel the next step is to repeat his arteriogram to determine our surgical options.  We discussed possibly just treating the right groin vs. below-knee bypass.  This will depend on the angiographic findings.  He'll need cardiac clearance before surgery.  He will also be carotid Dopplers and vein mapping performed if we proceed with surgery.  Again this will depend on the results of his arteriogram which has been scheduled for Tuesday, September 8  V. Leia Alf, M.D. [facility] Office: [phone] Pager:  [phone]

## 2014-09-04 ENCOUNTER — Other Ambulatory Visit: Payer: Self-pay | Admitting: *Deleted

## 2014-09-04 ENCOUNTER — Telehealth: Payer: Self-pay | Admitting: Surgery

## 2014-09-04 DIAGNOSIS — Z01810 Encounter for preprocedural cardiovascular examination: Secondary | ICD-10-CM

## 2014-09-04 DIAGNOSIS — I739 Peripheral vascular disease, unspecified: Secondary | ICD-10-CM

## 2014-09-04 NOTE — Telephone Encounter (Signed)
Message copied by Gena Fray on Wed Sep 04, 2014 11:17 AM ------      Message from: Peter Minium K      Created: Wed Sep 04, 2014  9:37 AM      Regarding: Schedule preop visits please       Hinton Dyer please get the cardiac clearance and dopplers scheduled, thanks.             ----- Message -----         From: Serafina Mitchell, MD         Sent: 09/03/2014  10:24 PM           To: [facility] Charge Pool            Please schedule for cardiac clearance.  Has seen Bensimhon in th epast.  Also needs carotid dopplers.  I am going to schedule him for right femoral endarterectomy.  He left before I could further discuss this with him.  I will call office with a date for surgery       ------

## 2014-09-04 NOTE — Telephone Encounter (Signed)
Spoke with pt and his wife via speaker phone- pt is scheduled for Cardiac clearance on 09/09/14 @ 12:45 with Dr Polo Riley PA- Anthony [name]. Carotid dopplers are scheduled for 09/16 @ 1:00 here in our office, pt understands, dpm

## 2014-09-09 ENCOUNTER — Encounter: Payer: Self-pay | Admitting: *Deleted

## 2014-09-09 ENCOUNTER — Ambulatory Visit (INDEPENDENT_AMBULATORY_CARE_PROVIDER_SITE_OTHER): Payer: MEDICARE | Admitting: Physician Assistant

## 2014-09-09 ENCOUNTER — Encounter: Payer: Self-pay | Admitting: Physician Assistant

## 2014-09-09 VITALS — BP 140/68 | HR 57 | Ht 69.0 in | Wt 257.8 lb

## 2014-09-09 DIAGNOSIS — I10 Essential (primary) hypertension: Secondary | ICD-10-CM

## 2014-09-09 DIAGNOSIS — R0789 Other chest pain: Secondary | ICD-10-CM

## 2014-09-09 DIAGNOSIS — I739 Peripheral vascular disease, unspecified: Secondary | ICD-10-CM

## 2014-09-09 DIAGNOSIS — I70219 Atherosclerosis of native arteries of extremities with intermittent claudication, unspecified extremity: Secondary | ICD-10-CM

## 2014-09-09 DIAGNOSIS — Z6841 Body Mass Index (BMI) 40.0 and over, adult: Secondary | ICD-10-CM

## 2014-09-09 DIAGNOSIS — Z01818 Encounter for other preprocedural examination: Secondary | ICD-10-CM

## 2014-09-09 DIAGNOSIS — I2510 Atherosclerotic heart disease of native coronary artery without angina pectoris: Secondary | ICD-10-CM

## 2014-09-09 NOTE — Assessment & Plan Note (Signed)
Patient is here for preoperative cardiac clearance before undergoing surgery for stenosis of the femoral artery by Dr. [name]. He has remote history of CAD with stenting in 2001. He had a normal stress Myoview in October 2014. He has no symptoms of angina, heart failure or stroke symptoms. He had lumbar disc surgery in June of this year without difficulty. I discussed this patient in detail with Dr. Meda Coffee who concurs that patient can proceed with the upcoming surgery without any further cardiac workup.

## 2014-09-09 NOTE — Assessment & Plan Note (Signed)
Stent in 2001, normal stress Myoview in October 2014. Asymptomatic.

## 2014-09-09 NOTE — Assessment & Plan Note (Signed)
Weight loss recommended 

## 2014-09-09 NOTE — Assessment & Plan Note (Signed)
Stable

## 2014-09-09 NOTE — Progress Notes (Signed)
HPI: This is a 68 year old male patient who has history of coronary artery disease status post stent in 2001 normal myocardial perfusion scan in 09/2013. 2-D echo in 2013 EF 50-55% with grade 1 diastolic dysfunction. Also has hypertension, hyperlipidemia, and diabetes mellitus 2, obstructive sleep apnea intolerant of CPAP, former smoker, and severe osteoarthritis.  Patient recently had a lower extremity angiogram and was found to have stenosis of his right common femoral and proximal superficial femoral artery. He's got diffuse disease throughout the right superficial femoral artery with near occlusion of the popliteal artery behind the joint space. He is to have surgery by Dr. [name]. He is here for surgical clearance.  The patient denies any chest pain, palpitations, dyspnea at rest, dizziness or presyncope. He has chronic dyspnea on exertion due to his COPD. He had lumbar back surgery in June of this year without any difficulties. He has no symptoms of chest pain heart failure or prior stroke. He had a normal stress Myoview in October 2014.  No Known Allergies   Current Outpatient Prescriptions  Medication Sig Dispense Refill  . albuterol (PROAIR HFA) 108 (90 BASE) MCG/ACT inhaler Inhale 1-2 puffs into the lungs every 6 (six) hours as needed for wheezing or shortness of breath.  3 Inhaler  3  . aspirin 325 MG tablet Take 325 mg by mouth daily.      . B Complex Vitamins (VITAMIN B COMPLEX PO) Take 1 tablet by mouth 2 (two) times daily.       . bisoprolol (ZEBETA) 5 MG tablet Take 5 mg by mouth daily.      Marland Kitchen CINNAMON PO Take 2 tablets by mouth daily.      Marland Kitchen doxazosin (CARDURA) 4 MG tablet Take 2 mg by mouth at bedtime.       Marland Kitchen doxycycline (ADOXA) 50 MG tablet Take 50 mg by mouth daily.      Marland Kitchen doxycycline (VIBRAMYCIN) 50 MG capsule       . fish oil-omega-3 fatty acids 1000 MG capsule Take 3 g by mouth daily.       . furosemide (LASIX) 20 MG tablet Take 20 mg by mouth daily.         Marland Kitchen GARLIC PO Take 1 tablet by mouth daily.      Marland Kitchen glipiZIDE (GLUCOTROL XL) 5 MG 24 hr tablet Take 5 mg by mouth daily.      . isosorbide mononitrate (IMDUR) 30 MG 24 hr tablet Take 30 mg by mouth daily.      Marland Kitchen L-Lysine 1000 MG TABS Take 1,000 mg by mouth daily.       Marland Kitchen losartan (COZAAR) 100 MG tablet Take 100 mg by mouth daily.      . metFORMIN (GLUCOPHAGE) 1000 MG tablet Take 1,000 mg by mouth 2 (two) times daily with a meal.      . MISC NATURAL PRODUCTS PO Take 500 mg by mouth daily. TUMERIC capsules      . Multiple Vitamin (MULTIVITAMIN) tablet Take 1 tablet by mouth daily.      . pentoxifylline (TRENTAL) 400 MG CR tablet Take 1 tablet (400 mg total) by mouth 2 (two) times daily.  180 tablet  2  . potassium chloride SA (K-DUR,KLOR-CON) 20 MEQ tablet Take 20 mEq by mouth 2 (two) times daily.      Marland Kitchen rOPINIRole (REQUIP) 1 MG tablet Take 1 mg by mouth at bedtime.      . simvastatin (ZOCOR) 40 MG tablet Take 40  mg by mouth every evening.      . tiotropium (SPIRIVA) 18 MCG inhalation capsule Place 18 mcg into inhaler and inhale every other day.       No current facility-administered medications for this visit.    Past Medical History  Diagnosis Date  . CAD (coronary artery disease)     stent placed 06/02/2000  . Lumbar stenosis   . Diabetes mellitus   . Hyperlipidemia   . Congestive heart failure   . COPD (chronic obstructive pulmonary disease) 08/11/2012  . Chronic respiratory failure with hypoxia 08/11/2012  . Peripheral vascular disease   . OSA (obstructive sleep apnea) 08/11/2012    cpcap    Past Surgical History  Procedure Laterality Date  . Tonsillectomy  1965  . [place]    right foot  . Neuroma surgery  1996    right foot  . Lumbar disc surgery  Dec 1997 & Ampril 2005  . Cervical fusion  Nov 2002  . Hammer toe surgery  June 2007 & August 2008    left foot  . Carpal tunnel release  09/30/2006    right wrist  . Hand arthroplasty  Oct 2010    right thumb   . Heart catherization  03/26/1999  . Angioplasty  06/02/2000    Stent  . Spine surgery  04/21/2004    Lower back disk gurgery- Lumbar stenosis  . Abdominal angiogram  Dec. 4, 2013  . Cardiac catheterization    . Anterior lateral lumbar fusion 4 levels Right 05/28/2014    Procedure: Right L4-5 L3-4 L2-3, L1-2  Anterior lateral lumbar fusion with percutaneaous pedicle screws. Lumbar four/five, three/four, two/three and possible two/one;  Surgeon: Erline Levine, MD;  Location: [facility];  Service: Neurosurgery;  Laterality: Right;  Lumbar One-Five Fusion with Percutaneous Screws  . Lumbar percutaneous pedicle screw 4 level N/A 05/28/2014    Procedure: LUMBAR PERCUTANEOUS PEDICLE SCREW 4 LEVEL;  Surgeon: Erline Levine, MD;  Location: [facility];  Service: Neurosurgery;  Laterality: N/A;    Family History  Problem Relation Age of Onset  . Heart disease Father 97  . Cancer Father   . Hyperlipidemia Father   . Hypertension Father   . Heart attack Father   . Cancer Mother   . Deep vein thrombosis Mother     Varicose veins  . Diabetes Mother   . Hyperlipidemia Mother   . Hypertension Mother     History   Social History  . Marital Status: Married    Spouse Name: N/A    Number of Children: N/A  . Years of Education: N/A   Occupational History  . retired    Social History Main Topics  . Smoking status: Former Smoker -- 2.00 packs/day for 28 years    Types: Cigarettes    Quit date: 08/03/1987  . Smokeless tobacco: Never Used  . Alcohol Use: Yes     Comment: weekly  . Drug Use: No  . Sexual Activity: Not on file   Other Topics Concern  . Not on file   Social History Narrative  . No narrative on file    ROS: Complains of claudication with very little activity. See history of present illness otherwise negative  BP 140/68  Pulse 57  Ht 5\' 9"  (1.753 m)  Wt 116.937 kg (257 lb 12.8 oz)  BMI 38.05 kg/m2  PHYSICAL EXAM: Obese, in no acute distress. Neck: No JVD, HJR, Bruit, or  thyroid enlargement  Lungs:  No tachypnea, clear without wheezing, rales, or rhonchi  Cardiovascular: RRR, PMI not displaced, heart sounds normal, no murmurs, gallops, bruit, thrill, or heave.  Abdomen: BS normal. Soft without organomegaly, masses, lesions or tenderness.  Extremities: without cyanosis, clubbing or edema. Decreased distal pulses bilateral  SKin: Warm, no lesions or rashes   Musculoskeletal: No deformities  Neuro: no focal signs   Wt Readings from Last 3 Encounters:  09/03/14 113.399 kg (250 lb)  09/03/14 113.399 kg (250 lb)  08/26/14 113.399 kg (250 lb)     EKG: Sinus bradycardia 57 beats per minute, no acute change

## 2014-09-09 NOTE — Patient Instructions (Addendum)
Your physician wants you to follow-up in: 6 months with Dr.Bensimhon You will receive a reminder letter in the mail two months in advance. If you don't receive a letter, please call our office to schedule the follow-up appointment.   Your Physician recommends you are cleared for surgery    Your physician recommends that you continue on your current medications as directed. Please refer to the Current Medication list given to you today.

## 2014-09-09 NOTE — Assessment & Plan Note (Signed)
Awaiting surgery

## 2014-09-11 ENCOUNTER — Ambulatory Visit (HOSPITAL_COMMUNITY)
Admission: RE | Admit: 2014-09-11 | Discharge: 2014-09-11 | Disposition: A | Discharge status: Home / Self Care | Payer: MEDICARE | Source: Ambulatory Visit | Attending: Surgery | Admitting: Surgery

## 2014-09-11 DIAGNOSIS — Z01810 Encounter for preprocedural cardiovascular examination: Secondary | ICD-10-CM

## 2014-09-11 DIAGNOSIS — I739 Peripheral vascular disease, unspecified: Secondary | ICD-10-CM | POA: Insufficient documentation

## 2014-09-11 DIAGNOSIS — Z01818 Encounter for other preprocedural examination: Secondary | ICD-10-CM | POA: Insufficient documentation

## 2014-09-20 ENCOUNTER — Other Ambulatory Visit: Payer: Self-pay

## 2014-09-27 ENCOUNTER — Encounter (HOSPITAL_COMMUNITY): Payer: Self-pay | Admitting: Pharmacy Technician

## 2014-10-01 NOTE — Pre-Procedure Instructions (Signed)
Anthony [name]  10/01/2014   Your procedure is scheduled on:  Thursday. October 15.  Report to [facility] Admitting at 10:50AM.  Call this number if you have problems the morning of surgery: [phone]   Remember:   Do not eat food or drink liquids after midnight Wednesday, October 14.   Take these medicines the morning of surgery with A SIP OF WATER: bisoprolol (ZEBETA),doxazosin (CARDURA),    isosorbide mononitrate) (IMDUR).              May use inhaler and please bring Albuterol inhaler to the hospital with you.               Stop pentoxifylline (TRENTAL) per MD instructions.  Stop Aspirin per MD instructions.                Stop all vitamins and Herbal medications.            Take medication for diabetes on the morning of surgery.        Do not wear jewelry, make-up or nail polish.  Do not wear lotions, powders, or perfumes.   Me  May shave neck and face.               Do not bring valuables to the hospital.              [facility] is not responsible  for any belongings or valuables.               Contacts, dentures or bridgework may not be worn into surgery.  Leave suitcase in the car. After surgery it may be brought to your room.  For patients admitted to the hospital, discharge time is determined by your treatment team.               Patients discharged the day of surgery will not be allowed to drive home.  Name and phone number of your driver:-   Special Instructions: Review   - Preparing For Surgery.   Please read over the following fact sheets that you were given: Pain Booklet, Coughing and Deep Breathing, Blood Transfusion Information and Surgical Site Infection Prevention

## 2014-10-01 NOTE — Pre-Procedure Instructions (Signed)
Anthony [name]  10/01/2014   Your procedure is scheduled on:  Thursday. October 15.  Report to [facility] Admitting at 10:50AM.  Call this number if you have problems the morning of surgery: [phone]   Remember:   Do not eat food or drink liquids after midnight Wednesday, October 14.   Take these medicines the morning of surgery with A SIP OF WATER: bisoprolol (ZEBETA),doxazosin (CARDURA),    isosorbide mononitrate) (IMDUR).              May use inhaler and please bring Albuterol inhaler to the hospital with you.              Stop pentoxifylline (TRENTAL) 1 week prior to surgery per MD instructions.  Stop Aspirin per MD instructions.                Stop all vitamins and Herbal medications.            Take medication for diabetes on the morning of surgery.        Do not wear jewelry, make-up or nail polish.  Do not wear lotions, powders, or perfumes.   Me  May shave neck and face.               Do not bring valuables to the hospital.              [facility] is not responsible  for any belongings or valuables.               Contacts, dentures or bridgework may not be worn into surgery.  Leave suitcase in the car. After surgery it may be brought to your room.  For patients admitted to the hospital, discharge time is determined by your treatment team.               Patients discharged the day of surgery will not be allowed to drive home.  Name and phone number of your driver:-   Special Instructions: Review  [place] - Preparing For Surgery.   Please read over the following fact sheets that you were given: Pain Booklet, Coughing and Deep Breathing, Blood Transfusion Information and Surgical Site Infection Prevention

## 2014-10-02 ENCOUNTER — Encounter (HOSPITAL_COMMUNITY): Payer: Self-pay

## 2014-10-02 ENCOUNTER — Encounter (HOSPITAL_COMMUNITY)
Admission: RE | Admit: 2014-10-02 | Discharge: 2014-10-02 | Disposition: A | Discharge status: Home / Self Care | Payer: MEDICARE | Source: Ambulatory Visit | Attending: Surgery | Admitting: Surgery

## 2014-10-02 DIAGNOSIS — I509 Heart failure, unspecified: Secondary | ICD-10-CM | POA: Insufficient documentation

## 2014-10-02 DIAGNOSIS — Z87891 Personal history of nicotine dependence: Secondary | ICD-10-CM | POA: Insufficient documentation

## 2014-10-02 DIAGNOSIS — I6523 Occlusion and stenosis of bilateral carotid arteries: Secondary | ICD-10-CM | POA: Diagnosis not present

## 2014-10-02 DIAGNOSIS — J9611 Chronic respiratory failure with hypoxia: Secondary | ICD-10-CM | POA: Insufficient documentation

## 2014-10-02 DIAGNOSIS — Z6838 Body mass index (BMI) 38.0-38.9, adult: Secondary | ICD-10-CM | POA: Insufficient documentation

## 2014-10-02 DIAGNOSIS — J449 Chronic obstructive pulmonary disease, unspecified: Secondary | ICD-10-CM | POA: Insufficient documentation

## 2014-10-02 DIAGNOSIS — I2510 Atherosclerotic heart disease of native coronary artery without angina pectoris: Secondary | ICD-10-CM | POA: Diagnosis not present

## 2014-10-02 DIAGNOSIS — I739 Peripheral vascular disease, unspecified: Secondary | ICD-10-CM | POA: Diagnosis not present

## 2014-10-02 DIAGNOSIS — E119 Type 2 diabetes mellitus without complications: Secondary | ICD-10-CM | POA: Diagnosis not present

## 2014-10-02 DIAGNOSIS — E669 Obesity, unspecified: Secondary | ICD-10-CM | POA: Diagnosis not present

## 2014-10-02 DIAGNOSIS — Z0000 Encounter for general adult medical examination without abnormal findings: Secondary | ICD-10-CM | POA: Diagnosis present

## 2014-10-02 DIAGNOSIS — E785 Hyperlipidemia, unspecified: Secondary | ICD-10-CM | POA: Insufficient documentation

## 2014-10-02 DIAGNOSIS — J986 Disorders of diaphragm: Secondary | ICD-10-CM | POA: Insufficient documentation

## 2014-10-02 DIAGNOSIS — G4733 Obstructive sleep apnea (adult) (pediatric): Secondary | ICD-10-CM | POA: Diagnosis not present

## 2014-10-02 HISTORY — DX: Shortness of breath: R06.02

## 2014-10-02 HISTORY — DX: Essential (primary) hypertension: I10

## 2014-10-02 HISTORY — DX: Unspecified osteoarthritis, unspecified site: M19.90

## 2014-10-02 HISTORY — DX: Pneumonia, unspecified organism: J18.9

## 2014-10-02 LAB — URINALYSIS, ROUTINE W REFLEX MICROSCOPIC
Bilirubin Urine: NEGATIVE
Glucose, UA: NEGATIVE mg/dL
Hgb urine dipstick: NEGATIVE
Ketones, ur: NEGATIVE mg/dL
Leukocytes, UA: NEGATIVE
Nitrite: NEGATIVE
Protein, ur: NEGATIVE mg/dL
Specific Gravity, Urine: 1.025 (ref 1.005–1.030)
Urobilinogen, UA: 0.2 mg/dL (ref 0.0–1.0)
pH: 5 (ref 5.0–8.0)

## 2014-10-02 LAB — COMPREHENSIVE METABOLIC PANEL
ALT: 14 U/L (ref 0–53)
AST: 19 U/L (ref 0–37)
Albumin: 3.7 g/dL (ref 3.5–5.2)
Alkaline Phosphatase: 85 U/L (ref 39–117)
Anion gap: 13 (ref 5–15)
BUN: 26 mg/dL — ABNORMAL HIGH (ref 6–23)
CO2: 23 mEq/L (ref 19–32)
Calcium: 9.2 mg/dL (ref 8.4–10.5)
Chloride: 104 mEq/L (ref 96–112)
Creatinine, Ser: 1.07 mg/dL (ref 0.50–1.35)
GFR calc Af Amer: 79 mL/min — ABNORMAL LOW (ref >90)
GFR calc non Af Amer: 68 mL/min — ABNORMAL LOW (ref >90)
Glucose, Bld: 153 mg/dL — ABNORMAL HIGH (ref 70–99)
Potassium: 5.2 mEq/L (ref 3.7–5.3)
Sodium: 140 mEq/L (ref 137–147)
Total Bilirubin: 0.4 mg/dL (ref 0.3–1.2)
Total Protein: 7.6 g/dL (ref 6.0–8.3)

## 2014-10-02 LAB — TYPE AND SCREEN
ABO/RH(D): O POS
Antibody Screen: NEGATIVE

## 2014-10-02 LAB — CBC
HCT: 44.7 % (ref 39.0–52.0)
Hemoglobin: 14.8 g/dL (ref 13.0–17.0)
MCH: 29.7 pg (ref 26.0–34.0)
MCHC: 33.1 g/dL (ref 30.0–36.0)
MCV: 89.6 fL (ref 78.0–100.0)
Platelets: 141 10*3/uL — ABNORMAL LOW (ref 150–400)
RBC: 4.99 MIL/uL (ref 4.22–5.81)
RDW: 13.5 % (ref 11.5–15.5)
WBC: 5.9 10*3/uL (ref 4.0–10.5)

## 2014-10-02 LAB — PROTIME-INR
INR: 0.99 (ref 0.00–1.49)
Prothrombin Time: 13.1 seconds (ref 11.6–15.2)

## 2014-10-02 LAB — SURGICAL PCR SCREEN
MRSA, PCR: NEGATIVE
Staphylococcus aureus: NEGATIVE

## 2014-10-02 LAB — APTT: aPTT: 30 seconds (ref 24–37)

## 2014-10-02 NOTE — Pre-Procedure Instructions (Addendum)
Anthony [name]  10/02/2014   Your procedure is scheduled on:  Thursday. October 15.  Report to [facility] Admitting at 10:50AM.  Call this number if you have problems the morning of surgery: [phone]   Remember:   Do not eat food or drink liquids after midnight Wednesday, October 14.   Take these medicines the morning of surgery with A SIP OF WATER: bisoprolol (ZEBETA),doxazosin (CARDURA),    isosorbide mononitrate) (IMDUR).              May use inhaler and please bring Albuterol inhaler to the hospital with you.              Stop pentoxifylline (TRENTAL) 1 week prior to surgery per MD instructions.  Stop Aspirin per MD instructions.                Stop all vitamins and Herbal medications.                    Do not wear jewelry, make-up or nail polish.  Do not wear lotions, powders, or perfumes.   Me  May shave neck and face.               Do not bring valuables to the hospital.              [facility] is not responsible  for any belongings or valuables.               Contacts, dentures or bridgework may not be worn into surgery.  Leave suitcase in the car. After surgery it may be brought to your room.  For patients admitted to the hospital, discharge time is determined by your treatment team.               Patients discharged the day of surgery will not be allowed to drive home.     Special Instructions:[place] - Preparing for Surgery  Before surgery, you can play an important role.  Because skin is not sterile, your skin needs to be as free of germs as possible.  You can reduce the number of germs on you skin by washing with CHG (chlorahexidine gluconate) soap before surgery.  CHG is an antiseptic cleaner which kills germs and bonds with the skin to continue killing germs even after washing.  Please DO NOT use if you have an allergy to CHG or antibacterial soaps.  If your skin becomes reddened/irritated stop using the CHG and inform your nurse when you  arrive at Short Stay.  Do not shave (including legs and underarms) for at least 48 hours prior to the first CHG shower.  You may shave your face.  Please follow these instructions carefully:   1.  Shower with CHG Soap the night before surgery and the                                morning of Surgery.  2.  If you choose to wash your hair, wash your hair first as usual with your       normal shampoo.  3.  After you shampoo, rinse your hair and body thoroughly to remove the                      Shampoo.  4.  Use CHG as you would any other liquid soap.  You can apply chg directly  to the skin and wash gently with scrungie or a clean washcloth.  5.  Apply the CHG Soap to your body ONLY FROM THE NECK DOWN.        Do not use on open wounds or open sores.  Avoid contact with your eyes,       ears, mouth and genitals (private parts).  Wash genitals (private parts)       with your normal soap.  6.  Wash thoroughly, paying special attention to the area where your surgery        will be performed.  7.  Thoroughly rinse your body with warm water from the neck down.  8.  DO NOT shower/wash with your normal soap after using and rinsing off       the CHG Soap.  9.  Pat yourself dry with a clean towel.            10.  Wear clean pajamas.            11.  Place clean sheets on your bed the night of your first shower and do not        sleep with pets.  Day of Surgery  Do not apply any lotions/deoderants the morning of surgery.  Please wear clean clothes to the hospital/surgery center.      Please read over the following fact sheets that you were given: Pain Booklet, Coughing and Deep Breathing, Blood Transfusion Information and Surgical Site Infection Prevention

## 2014-10-03 NOTE — Progress Notes (Signed)
Anesthesia Chart Review: Patient is a 67 year old male scheduled for right femoral endarterectomy on 10/10/14 by Dr. [name].   History includes former smoker, CAD s/p stent '01, DM2, HLD, CHF, COPD, right hemidiaphragm paralysis, chronic respiratory failure with hypoxia '13 (now off supplemental O2 per 05/01/14 cardiology notes), PVD/claudication, OSA with CPAP use, tonsillectomy, cervical fusion '02, lumbar surgery '05, right L1-5 anterior lateral fusion 05/28/14. BMI is consistent with obesity. PCP is Dr. [name]. Pulmonologist is Dr. Halford Chessman, last visit 06/24/14. Cardiologist is Dr. [name], last cardiology visit 09/09/14 with Estella Husk, PA-C with cardiac clearance for this procedure. Patient is the father-in-law to interventional radiologist Dr. [name].   EKG on 09/09/14 showed SB at 57 bpm, SR.  Nuclear stress test on 10/18/13 showed: Overall Impression: Normal stress nuclear study. LV Ejection Fraction: 56%. LV Wall Motion: NL LV Function; NL Wall Motion.   Echo on 10/09/12 showed:  - Left ventricle: The cavity size was normal. Wall thickness was increased in a pattern of mild LVH. Systolic function was normal. The estimated ejection fraction was in the range of 50% to 55%. Doppler parameters are consistent with abnormal left ventricular relaxation (grade 1 diastolic dysfunction). - Left atrium: The atrium was mildly dilated. - Pulmonary arteries: PA peak pressure: 23mm Hg (S). - Mitral valve: Trivial regurgitation.  - Tricuspid valve: Mild regurgitation.   According to Dr. Clayborne Dana 05/01/14 note, patient had a cardiac cath in 2001 in [place] and was told "he had 70% blockage in one artery and one in the back was totally blocker. Eventually underwent stenting of the 70%. No caths since."   Carotid duplex on 09/11/14 showed: bilateral ICA velocities suggest < 40% stenosis.   CXR on 05/21/14 showed: Chronic elevation of the right hemidiaphragm. No superimposed acute abnormality    According to Dr. Juanetta Gosling last office note:  PFT 08/13/12>>FEV1 1.83 (62%), FEV1% 64, TLC 5.17 (82%), DLCO 67%, +BD  SNIFF test 08/14/12>>Paradoxical motion of the right hemidiaphragm with inspiration.  PSG 08/14/12>>AHI 38.5. CPAP 13 cm H2O with 1 liter oxygen.  Echo 10/09/12>>mild LVH, EF 50 to 37%, grade 1 diastolic dysfx, mild LA dilation, PAS 35 mmHg  CPAP 09/26/12 to 10/25/12>>Used on 30 of 30 nights with average 8 hrs 48 min. Average AHI 0.6 with CPAP 13 cm H2O.  ONO with CPAP and RA 01/18/13 >> test time 10 hrs 37 min. Mean SpO2 94%, low SpO2 86%. Spent 16 sec with SpO2 < 88%.   Preoperative labs noted.   Patient with recent cardiology and pulmonology follow-up. He tolerated lumbar fusion earlier this year.  He has been cleared by cardiology.  If no acute changes then I anticipate that he can proceed as planned.  Nakai Hugh [facility] Short Stay Center/Anesthesiology Phone [phone] 10/03/2014 1:31 PM

## 2014-10-09 MED ORDER — SODIUM CHLORIDE 0.9 % IV SOLN: INTRAVENOUS | Status: DC

## 2014-10-09 MED ORDER — DEXTROSE 5 % IV SOLN
1.5000 g | INTRAVENOUS | Status: AC
  Administered 2014-10-10: 1.5 g via INTRAVENOUS
  Filled 2014-10-09: qty 1.5

## 2014-10-10 ENCOUNTER — Encounter (HOSPITAL_COMMUNITY)
Admission: RE | Disposition: A | Discharge status: Home / Self Care | Payer: Self-pay | Source: Ambulatory Visit | Attending: Surgery

## 2014-10-10 ENCOUNTER — Inpatient Hospital Stay (HOSPITAL_COMMUNITY): Payer: MEDICARE | Admitting: Anesthesiology

## 2014-10-10 ENCOUNTER — Encounter (HOSPITAL_COMMUNITY): Payer: Self-pay | Admitting: Acute Care

## 2014-10-10 ENCOUNTER — Inpatient Hospital Stay (HOSPITAL_COMMUNITY)
Admission: RE | Admit: 2014-10-10 | Discharge: 2014-10-11 | DRG: 271 | Disposition: A | Discharge status: Home / Self Care | Payer: MEDICARE | Source: Ambulatory Visit | Attending: Surgery | Admitting: Surgery

## 2014-10-10 ENCOUNTER — Encounter (HOSPITAL_COMMUNITY): Payer: MEDICARE | Admitting: Vascular Surgery

## 2014-10-10 DIAGNOSIS — E119 Type 2 diabetes mellitus without complications: Secondary | ICD-10-CM | POA: Diagnosis present

## 2014-10-10 DIAGNOSIS — J449 Chronic obstructive pulmonary disease, unspecified: Secondary | ICD-10-CM | POA: Diagnosis present

## 2014-10-10 DIAGNOSIS — I2510 Atherosclerotic heart disease of native coronary artery without angina pectoris: Secondary | ICD-10-CM | POA: Diagnosis present

## 2014-10-10 DIAGNOSIS — E785 Hyperlipidemia, unspecified: Secondary | ICD-10-CM | POA: Diagnosis present

## 2014-10-10 DIAGNOSIS — Z955 Presence of coronary angioplasty implant and graft: Secondary | ICD-10-CM | POA: Diagnosis not present

## 2014-10-10 DIAGNOSIS — Z7982 Long term (current) use of aspirin: Secondary | ICD-10-CM | POA: Diagnosis not present

## 2014-10-10 DIAGNOSIS — D62 Acute posthemorrhagic anemia: Secondary | ICD-10-CM | POA: Diagnosis not present

## 2014-10-10 DIAGNOSIS — I70211 Atherosclerosis of native arteries of extremities with intermittent claudication, right leg: Principal | ICD-10-CM | POA: Diagnosis present

## 2014-10-10 DIAGNOSIS — Z96691 Finger-joint replacement of right hand: Secondary | ICD-10-CM | POA: Diagnosis present

## 2014-10-10 DIAGNOSIS — M79661 Pain in right lower leg: Secondary | ICD-10-CM | POA: Diagnosis present

## 2014-10-10 DIAGNOSIS — Z7951 Long term (current) use of inhaled steroids: Secondary | ICD-10-CM

## 2014-10-10 DIAGNOSIS — I708 Atherosclerosis of other arteries: Secondary | ICD-10-CM | POA: Diagnosis present

## 2014-10-10 DIAGNOSIS — Z981 Arthrodesis status: Secondary | ICD-10-CM

## 2014-10-10 DIAGNOSIS — Z87891 Personal history of nicotine dependence: Secondary | ICD-10-CM

## 2014-10-10 DIAGNOSIS — I739 Peripheral vascular disease, unspecified: Secondary | ICD-10-CM

## 2014-10-10 DIAGNOSIS — I70201 Unspecified atherosclerosis of native arteries of extremities, right leg: Secondary | ICD-10-CM

## 2014-10-10 DIAGNOSIS — Z79899 Other long term (current) drug therapy: Secondary | ICD-10-CM | POA: Diagnosis not present

## 2014-10-10 DIAGNOSIS — G4733 Obstructive sleep apnea (adult) (pediatric): Secondary | ICD-10-CM | POA: Diagnosis present

## 2014-10-10 HISTORY — PX: ENDARTERECTOMY FEMORAL: SHX5804

## 2014-10-10 LAB — GLUCOSE, CAPILLARY
Glucose-Capillary: 161 mg/dL — ABNORMAL HIGH (ref 70–99)
Glucose-Capillary: 133 mg/dL — ABNORMAL HIGH (ref 70–99)
Glucose-Capillary: 319 mg/dL — ABNORMAL HIGH (ref 70–99)

## 2014-10-10 LAB — POCT I-STAT 4, (NA,K, GLUC, HGB,HCT)
Glucose, Bld: 137 mg/dL — ABNORMAL HIGH (ref 70–99)
HCT: 38 % — ABNORMAL LOW (ref 39.0–52.0)
Hemoglobin: 12.9 g/dL — ABNORMAL LOW (ref 13.0–17.0)
Potassium: 5 mEq/L (ref 3.7–5.3)
Sodium: 139 mEq/L (ref 137–147)

## 2014-10-10 SURGERY — ENDARTERECTOMY, FEMORAL
Anesthesia: General | Site: Groin | Laterality: Right

## 2014-10-10 MED ORDER — ROPINIROLE HCL 1 MG PO TABS
1.0000 mg | ORAL_TABLET | Freq: Every day | ORAL | Status: DC
  Administered 2014-10-10: 1 mg via ORAL
  Filled 2014-10-10 (×2): qty 1

## 2014-10-10 MED ORDER — INSULIN ASPART 100 UNIT/ML ~~LOC~~ SOLN
0.0000 [IU] | Freq: Every day | SUBCUTANEOUS | Status: DC
Start: 2014-10-10 — End: 2014-10-11
  Administered 2014-10-10: 4 [IU] via SUBCUTANEOUS

## 2014-10-10 MED ORDER — PROTAMINE SULFATE 10 MG/ML IV SOLN
INTRAVENOUS | Status: DC | PRN
  Administered 2014-10-10: 50 mg via INTRAVENOUS

## 2014-10-10 MED ORDER — OXYCODONE HCL 5 MG PO TABS: 5.0000 mg | ORAL_TABLET | Freq: Once | ORAL | Status: DC | PRN

## 2014-10-10 MED ORDER — MEPERIDINE HCL 25 MG/ML IJ SOLN
6.2500 mg | INTRAMUSCULAR | Status: DC | PRN
Start: 2014-10-10 — End: 2014-10-10

## 2014-10-10 MED ORDER — GUAIFENESIN-DM 100-10 MG/5ML PO SYRP: 15.0000 mL | ORAL_SOLUTION | ORAL | Status: DC | PRN

## 2014-10-10 MED ORDER — DOCUSATE SODIUM 100 MG PO CAPS
100.0000 mg | ORAL_CAPSULE | Freq: Every day | ORAL | Status: DC
  Administered 2014-10-11: 100 mg via ORAL
  Filled 2014-10-10: qty 1

## 2014-10-10 MED ORDER — LIDOCAINE HCL (CARDIAC) 20 MG/ML IV SOLN
INTRAVENOUS | Status: AC
  Filled 2014-10-10: qty 5

## 2014-10-10 MED ORDER — PHENYLEPHRINE HCL 10 MG/ML IJ SOLN
10.0000 mg | INTRAVENOUS | Status: DC | PRN
  Administered 2014-10-10: 50 ug/min via INTRAVENOUS

## 2014-10-10 MED ORDER — SODIUM CHLORIDE 0.9 % IV SOLN: INTRAVENOUS | Status: DC

## 2014-10-10 MED ORDER — PROMETHAZINE HCL 25 MG/ML IJ SOLN: 6.2500 mg | INTRAMUSCULAR | Status: DC | PRN

## 2014-10-10 MED ORDER — PROPOFOL 10 MG/ML IV BOLUS
INTRAVENOUS | Status: AC
  Filled 2014-10-10: qty 20

## 2014-10-10 MED ORDER — MORPHINE SULFATE 2 MG/ML IJ SOLN: 2.0000 mg | INTRAMUSCULAR | Status: DC | PRN

## 2014-10-10 MED ORDER — ALBUMIN HUMAN 5 % IV SOLN
INTRAVENOUS | Status: DC | PRN
  Administered 2014-10-10 (×3): via INTRAVENOUS

## 2014-10-10 MED ORDER — MAGNESIUM SULFATE 40 MG/ML IJ SOLN: 2.0000 g | Freq: Every day | INTRAMUSCULAR | Status: DC | PRN

## 2014-10-10 MED ORDER — HEPARIN SODIUM (PORCINE) 1000 UNIT/ML IJ SOLN
INTRAMUSCULAR | Status: AC
  Filled 2014-10-10: qty 1

## 2014-10-10 MED ORDER — PHENYLEPHRINE 40 MCG/ML (10ML) SYRINGE FOR IV PUSH (FOR BLOOD PRESSURE SUPPORT)
PREFILLED_SYRINGE | INTRAVENOUS | Status: AC
  Filled 2014-10-10: qty 10

## 2014-10-10 MED ORDER — PENTOXIFYLLINE ER 400 MG PO TBCR
400.0000 mg | EXTENDED_RELEASE_TABLET | Freq: Two times a day (BID) | ORAL | Status: DC
  Administered 2014-10-10 – 2014-10-11 (×2): 400 mg via ORAL
  Filled 2014-10-10 (×3): qty 1

## 2014-10-10 MED ORDER — ENOXAPARIN SODIUM 30 MG/0.3ML ~~LOC~~ SOLN
30.0000 mg | SUBCUTANEOUS | Status: DC
  Administered 2014-10-11: 30 mg via SUBCUTANEOUS
  Filled 2014-10-10 (×2): qty 0.3

## 2014-10-10 MED ORDER — CHLORHEXIDINE GLUCONATE 4 % EX LIQD: 60.0000 mL | Freq: Once | CUTANEOUS | Status: DC

## 2014-10-10 MED ORDER — FENTANYL CITRATE 0.05 MG/ML IJ SOLN: 25.0000 ug | INTRAMUSCULAR | Status: DC | PRN

## 2014-10-10 MED ORDER — LACTATED RINGERS IV SOLN
INTRAVENOUS | Status: DC
  Administered 2014-10-10: 12:00:00 via INTRAVENOUS

## 2014-10-10 MED ORDER — OXYCODONE-ACETAMINOPHEN 5-325 MG PO TABS: 1.0000 | ORAL_TABLET | ORAL | Status: DC | PRN

## 2014-10-10 MED ORDER — ISOSORBIDE MONONITRATE ER 30 MG PO TB24
30.0000 mg | ORAL_TABLET | Freq: Every day | ORAL | Status: DC
  Administered 2014-10-10 – 2014-10-11 (×2): 30 mg via ORAL
  Filled 2014-10-10 (×2): qty 1

## 2014-10-10 MED ORDER — INSULIN ASPART 100 UNIT/ML ~~LOC~~ SOLN: 0.0000 [IU] | Freq: Three times a day (TID) | SUBCUTANEOUS | Status: DC

## 2014-10-10 MED ORDER — METFORMIN HCL 500 MG PO TABS
1000.0000 mg | ORAL_TABLET | Freq: Two times a day (BID) | ORAL | Status: DC
  Administered 2014-10-11: 1000 mg via ORAL
  Filled 2014-10-10 (×4): qty 2

## 2014-10-10 MED ORDER — TIOTROPIUM BROMIDE MONOHYDRATE 18 MCG IN CAPS
18.0000 ug | ORAL_CAPSULE | RESPIRATORY_TRACT | Status: DC
  Filled 2014-10-10 (×2): qty 5

## 2014-10-10 MED ORDER — ARTIFICIAL TEARS OP OINT
TOPICAL_OINTMENT | OPHTHALMIC | Status: AC
  Filled 2014-10-10: qty 3.5

## 2014-10-10 MED ORDER — LOSARTAN POTASSIUM 50 MG PO TABS
100.0000 mg | ORAL_TABLET | Freq: Every day | ORAL | Status: DC
  Administered 2014-10-10 – 2014-10-11 (×2): 100 mg via ORAL
  Filled 2014-10-10 (×2): qty 2

## 2014-10-10 MED ORDER — HEPARIN SODIUM (PORCINE) 1000 UNIT/ML IJ SOLN
INTRAMUSCULAR | Status: DC | PRN
  Administered 2014-10-10: 1000 [IU] via INTRAVENOUS
  Administered 2014-10-10: 10000 [IU] via INTRAVENOUS

## 2014-10-10 MED ORDER — SENNOSIDES-DOCUSATE SODIUM 8.6-50 MG PO TABS
1.0000 | ORAL_TABLET | Freq: Every evening | ORAL | Status: DC | PRN
  Filled 2014-10-10: qty 1

## 2014-10-10 MED ORDER — LIDOCAINE HCL (CARDIAC) 20 MG/ML IV SOLN
INTRAVENOUS | Status: DC | PRN
  Administered 2014-10-10: 35 mg via INTRAVENOUS

## 2014-10-10 MED ORDER — ALUM & MAG HYDROXIDE-SIMETH 200-200-20 MG/5ML PO SUSP: 15.0000 mL | ORAL | Status: DC | PRN

## 2014-10-10 MED ORDER — METOPROLOL TARTRATE 1 MG/ML IV SOLN: 2.0000 mg | INTRAVENOUS | Status: DC | PRN

## 2014-10-10 MED ORDER — POTASSIUM CHLORIDE CRYS ER 20 MEQ PO TBCR: 20.0000 meq | EXTENDED_RELEASE_TABLET | Freq: Every day | ORAL | Status: DC | PRN

## 2014-10-10 MED ORDER — PANTOPRAZOLE SODIUM 40 MG PO TBEC
40.0000 mg | DELAYED_RELEASE_TABLET | Freq: Every day | ORAL | Status: DC
  Administered 2014-10-11: 40 mg via ORAL
  Filled 2014-10-10: qty 1

## 2014-10-10 MED ORDER — BISACODYL 10 MG RE SUPP: 10.0000 mg | Freq: Every day | RECTAL | Status: DC | PRN

## 2014-10-10 MED ORDER — SIMVASTATIN 40 MG PO TABS
40.0000 mg | ORAL_TABLET | Freq: Every evening | ORAL | Status: DC
  Administered 2014-10-10: 40 mg via ORAL
  Filled 2014-10-10 (×2): qty 1

## 2014-10-10 MED ORDER — BISOPROLOL FUMARATE 5 MG PO TABS
5.0000 mg | ORAL_TABLET | Freq: Every day | ORAL | Status: DC
  Administered 2014-10-11: 5 mg via ORAL
  Filled 2014-10-10: qty 1

## 2014-10-10 MED ORDER — FENTANYL CITRATE 0.05 MG/ML IJ SOLN
INTRAMUSCULAR | Status: AC
  Filled 2014-10-10: qty 5

## 2014-10-10 MED ORDER — 0.9 % SODIUM CHLORIDE (POUR BTL) OPTIME
TOPICAL | Status: DC | PRN
  Administered 2014-10-10: 2000 mL

## 2014-10-10 MED ORDER — ONDANSETRON HCL 4 MG/2ML IJ SOLN
INTRAMUSCULAR | Status: AC
  Filled 2014-10-10: qty 2

## 2014-10-10 MED ORDER — EPHEDRINE SULFATE 50 MG/ML IJ SOLN
INTRAMUSCULAR | Status: DC | PRN
  Administered 2014-10-10: 5 mg via INTRAVENOUS
  Administered 2014-10-10: 10 mg via INTRAVENOUS
  Administered 2014-10-10 (×2): 5 mg via INTRAVENOUS

## 2014-10-10 MED ORDER — MIDAZOLAM HCL 2 MG/2ML IJ SOLN: 0.5000 mg | Freq: Once | INTRAMUSCULAR | Status: DC | PRN

## 2014-10-10 MED ORDER — LACTATED RINGERS IV SOLN
INTRAVENOUS | Status: DC | PRN
  Administered 2014-10-10 (×2): via INTRAVENOUS

## 2014-10-10 MED ORDER — GLIPIZIDE ER 5 MG PO TB24
5.0000 mg | ORAL_TABLET | Freq: Every day | ORAL | Status: DC
  Administered 2014-10-11: 5 mg via ORAL
  Filled 2014-10-10 (×2): qty 1

## 2014-10-10 MED ORDER — ONDANSETRON HCL 4 MG/2ML IJ SOLN: 4.0000 mg | Freq: Four times a day (QID) | INTRAMUSCULAR | Status: DC | PRN

## 2014-10-10 MED ORDER — OXYCODONE HCL 5 MG/5ML PO SOLN: 5.0000 mg | Freq: Once | ORAL | Status: DC | PRN

## 2014-10-10 MED ORDER — SODIUM CHLORIDE 0.9 % IV SOLN: 500.0000 mL | Freq: Once | INTRAVENOUS | Status: AC | PRN

## 2014-10-10 MED ORDER — DOPAMINE-DEXTROSE 3.2-5 MG/ML-% IV SOLN: 3.0000 ug/kg/min | INTRAVENOUS | Status: DC

## 2014-10-10 MED ORDER — FLEET ENEMA 7-19 GM/118ML RE ENEM: 1.0000 | ENEMA | Freq: Once | RECTAL | Status: AC | PRN

## 2014-10-10 MED ORDER — DEXTROSE 5 % IV SOLN
1.5000 g | Freq: Two times a day (BID) | INTRAVENOUS | Status: AC
  Administered 2014-10-10 – 2014-10-11 (×2): 1.5 g via INTRAVENOUS
  Filled 2014-10-10 (×3): qty 1.5

## 2014-10-10 MED ORDER — ALBUTEROL SULFATE (2.5 MG/3ML) 0.083% IN NEBU: 2.5000 mg | INHALATION_SOLUTION | Freq: Four times a day (QID) | RESPIRATORY_TRACT | Status: DC | PRN

## 2014-10-10 MED ORDER — DOXAZOSIN MESYLATE 2 MG PO TABS
2.0000 mg | ORAL_TABLET | Freq: Every day | ORAL | Status: DC
  Administered 2014-10-10: 2 mg via ORAL
  Filled 2014-10-10 (×2): qty 1

## 2014-10-10 MED ORDER — SODIUM CHLORIDE 0.9 % IR SOLN
Status: DC | PRN
  Administered 2014-10-10: 13:00:00

## 2014-10-10 MED ORDER — HEMOSTATIC AGENTS (NO CHARGE) OPTIME
TOPICAL | Status: DC | PRN
  Administered 2014-10-10 (×2): 1 via TOPICAL

## 2014-10-10 MED ORDER — ACETAMINOPHEN 325 MG PO TABS: 325.0000 mg | ORAL_TABLET | ORAL | Status: DC | PRN

## 2014-10-10 MED ORDER — INSULIN ASPART 100 UNIT/ML ~~LOC~~ SOLN
0.0000 [IU] | Freq: Three times a day (TID) | SUBCUTANEOUS | Status: DC
  Administered 2014-10-11: 3 [IU] via SUBCUTANEOUS

## 2014-10-10 MED ORDER — ALBUTEROL SULFATE HFA 108 (90 BASE) MCG/ACT IN AERS: 1.0000 | INHALATION_SPRAY | Freq: Four times a day (QID) | RESPIRATORY_TRACT | Status: DC | PRN

## 2014-10-10 MED ORDER — POTASSIUM CHLORIDE CRYS ER 20 MEQ PO TBCR
20.0000 meq | EXTENDED_RELEASE_TABLET | Freq: Two times a day (BID) | ORAL | Status: DC
  Administered 2014-10-10 – 2014-10-11 (×2): 20 meq via ORAL
  Filled 2014-10-10 (×3): qty 1

## 2014-10-10 MED ORDER — LABETALOL HCL 5 MG/ML IV SOLN: 10.0000 mg | INTRAVENOUS | Status: DC | PRN

## 2014-10-10 MED ORDER — ASPIRIN 325 MG PO TABS
325.0000 mg | ORAL_TABLET | Freq: Every day | ORAL | Status: DC
  Administered 2014-10-10 – 2014-10-11 (×2): 325 mg via ORAL
  Filled 2014-10-10 (×2): qty 1

## 2014-10-10 MED ORDER — FENTANYL CITRATE 0.05 MG/ML IJ SOLN
INTRAMUSCULAR | Status: DC | PRN
  Administered 2014-10-10: 25 ug via INTRAVENOUS
  Administered 2014-10-10: 200 ug via INTRAVENOUS
  Administered 2014-10-10 (×2): 50 ug via INTRAVENOUS
  Administered 2014-10-10: 25 ug via INTRAVENOUS
  Administered 2014-10-10: 50 ug via INTRAVENOUS

## 2014-10-10 MED ORDER — PROTAMINE SULFATE 10 MG/ML IV SOLN
INTRAVENOUS | Status: AC
Start: 2014-10-10 — End: 2014-10-10
  Filled 2014-10-10: qty 5

## 2014-10-10 MED ORDER — ACETAMINOPHEN 650 MG RE SUPP: 325.0000 mg | RECTAL | Status: DC | PRN

## 2014-10-10 MED ORDER — PROPOFOL 10 MG/ML IV BOLUS
INTRAVENOUS | Status: DC | PRN
  Administered 2014-10-10: 120 mg via INTRAVENOUS
  Administered 2014-10-10: 20 mg via INTRAVENOUS

## 2014-10-10 MED ORDER — HYDRALAZINE HCL 20 MG/ML IJ SOLN: 10.0000 mg | INTRAMUSCULAR | Status: DC | PRN

## 2014-10-10 MED ORDER — FUROSEMIDE 20 MG PO TABS
20.0000 mg | ORAL_TABLET | Freq: Every day | ORAL | Status: DC
  Administered 2014-10-10 – 2014-10-11 (×2): 20 mg via ORAL
  Filled 2014-10-10 (×2): qty 1

## 2014-10-10 MED ORDER — PHENOL 1.4 % MT LIQD: 1.0000 | OROMUCOSAL | Status: DC | PRN

## 2014-10-10 SURGICAL SUPPLY — 58 items
BANDAGE ELASTIC 4 VELCRO ST LF (GAUZE/BANDAGES/DRESSINGS) IMPLANT
BLADE CLIPPER SURG (BLADE) ×2 IMPLANT
CANISTER SUCTION 2500CC (MISCELLANEOUS) ×2 IMPLANT
CATH EMB 5FR 80CM (CATHETERS) ×2 IMPLANT
CLIP TI MEDIUM 24 (CLIP) ×2 IMPLANT
CLIP TI WIDE RED SMALL 24 (CLIP) ×2 IMPLANT
DERMABOND ADVANCED (GAUZE/BANDAGES/DRESSINGS) ×1
DERMABOND ADVANCED .7 DNX12 (GAUZE/BANDAGES/DRESSINGS) ×1 IMPLANT
DRAIN CHANNEL 15F RND FF W/TCR (WOUND CARE) IMPLANT
DRAPE INCISE IOBAN 66X45 STRL (DRAPES) ×2 IMPLANT
DRAPE X-RAY CASS 24X20 (DRAPES) IMPLANT
DRSG COVADERM 4X10 (GAUZE/BANDAGES/DRESSINGS) IMPLANT
DRSG COVADERM 4X8 (GAUZE/BANDAGES/DRESSINGS) IMPLANT
ELECT REM PT RETURN 9FT ADLT (ELECTROSURGICAL) ×2
ELECTRODE REM PT RTRN 9FT ADLT (ELECTROSURGICAL) ×1 IMPLANT
EVACUATOR SILICONE 100CC (DRAIN) IMPLANT
GLOVE BIO SURGEON STRL SZ7.5 (GLOVE) ×2 IMPLANT
GLOVE BIOGEL M 6.5 STRL (GLOVE) ×2 IMPLANT
GLOVE BIOGEL PI IND STRL 6.5 (GLOVE) ×4 IMPLANT
GLOVE BIOGEL PI IND STRL 7.5 (GLOVE) ×4 IMPLANT
GLOVE BIOGEL PI IND STRL 8 (GLOVE) ×1 IMPLANT
GLOVE BIOGEL PI INDICATOR 6.5 (GLOVE) ×4
GLOVE BIOGEL PI INDICATOR 7.5 (GLOVE) ×4
GLOVE BIOGEL PI INDICATOR 8 (GLOVE) ×1
GLOVE ECLIPSE 6.5 STRL STRAW (GLOVE) ×2 IMPLANT
GLOVE SS BIOGEL STRL SZ 7 (GLOVE) ×1 IMPLANT
GLOVE SUPERSENSE BIOGEL SZ 7 (GLOVE) ×1
GLOVE SURG SS PI 7.0 STRL IVOR (GLOVE) ×2 IMPLANT
GLOVE SURG SS PI 7.5 STRL IVOR (GLOVE) ×2 IMPLANT
GOWN STRL REUS W/ TWL LRG LVL3 (GOWN DISPOSABLE) ×2 IMPLANT
GOWN STRL REUS W/ TWL XL LVL3 (GOWN DISPOSABLE) ×1 IMPLANT
GOWN STRL REUS W/TWL LRG LVL3 (GOWN DISPOSABLE) ×2
GOWN STRL REUS W/TWL XL LVL3 (GOWN DISPOSABLE) ×1
HEMOSTAT SNOW SURGICEL 2X4 (HEMOSTASIS) ×4 IMPLANT
KIT BASIN OR (CUSTOM PROCEDURE TRAY) ×2 IMPLANT
KIT ROOM TURNOVER OR (KITS) ×2 IMPLANT
NS IRRIG 1000ML POUR BTL (IV SOLUTION) ×4 IMPLANT
PACK PERIPHERAL VASCULAR (CUSTOM PROCEDURE TRAY) ×2 IMPLANT
PAD ARMBOARD 7.5X6 YLW CONV (MISCELLANEOUS) ×4 IMPLANT
PATCH VASCULAR VASCU GUARD 1X6 (Vascular Products) ×2 IMPLANT
PROBE PENCIL 8 MHZ STRL DISP (MISCELLANEOUS) ×2 IMPLANT
SET COLLECT BLD 21X3/4 12 (NEEDLE) IMPLANT
SPONGE INTESTINAL PEANUT (DISPOSABLE) ×4 IMPLANT
STOPCOCK 4 WAY LG BORE MALE ST (IV SETS) ×2 IMPLANT
SUT ETHILON 3 0 PS 1 (SUTURE) IMPLANT
SUT PROLENE 5 0 C 1 24 (SUTURE) ×12 IMPLANT
SUT PROLENE 6 0 BV (SUTURE) ×4 IMPLANT
SUT VIC AB 2-0 CT1 27 (SUTURE) ×2
SUT VIC AB 2-0 CT1 TAPERPNT 27 (SUTURE) ×2 IMPLANT
SUT VIC AB 3-0 SH 27 (SUTURE) ×1
SUT VIC AB 3-0 SH 27X BRD (SUTURE) ×1 IMPLANT
SUT VICRYL 4-0 PS2 18IN ABS (SUTURE) ×2 IMPLANT
SYRINGE 35CC LL (MISCELLANEOUS) ×2 IMPLANT
TOWEL OR 17X24 6PK STRL BLUE (TOWEL DISPOSABLE) ×2 IMPLANT
TRAY FOLEY CATH 16FRSI W/METER (SET/KITS/TRAYS/PACK) ×2 IMPLANT
TUBING EXTENTION W/L.L. (IV SETS) IMPLANT
UNDERPAD 30X30 INCONTINENT (UNDERPADS AND DIAPERS) ×2 IMPLANT
WATER STERILE IRR 1000ML POUR (IV SOLUTION) ×2 IMPLANT

## 2014-10-10 NOTE — H&P (Signed)
Patient name: Anthony [name] MRN: [id number] DOB: April 16, 1946 Sex: male  Chief Complaint   Patient presents with   .  PVD     1 year follow up on pvd. pt c/o increased pain in BLE, R>L Pt had 4 level fusion by Dr. Vertell Limber   HISTORY OF PRESENT ILLNESS:  The patient is back today for followup of his claudication, right leg greater than left. He has previously undergone angiography which revealed severe disease within his right common femoral and right popliteal artery, behind the knee. I have tried to manage him conservatively. He could not tolerate Pletal secondary to GI side effects. Trental was not very helpful. He states that over the past year his symptoms have progressed. He can now only walk about 40 feet before he gets cramping within his calf which takes approximately 15 minutes of rest to resolve. He denies rest pain or nonhealing wounds.  The patient is medically managed for hypercholesterolemia with a statin. He is a type II diabetic. His most recent hemoglobin A1c was in the 5.9 range. His blood pressure is managed with a ARB. He has not smoked since 1989  Past Medical History   Diagnosis  Date   .  CAD (coronary artery disease)      stent placed 06/02/2000   .  Lumbar stenosis    .  Diabetes mellitus    .  Hyperlipidemia    .  Congestive heart failure    .  COPD (chronic obstructive pulmonary disease)  08/11/2012   .  Chronic respiratory failure with hypoxia  08/11/2012   .  Peripheral vascular disease    .  OSA (obstructive sleep apnea)  08/11/2012     cpcap    Past Surgical History   Procedure  Laterality  Date   .  Tonsillectomy   1965   .  [place]     right foot   .  Neuroma surgery   1996     right foot   .  Lumbar disc surgery   Dec 1997 & Ampril 2005   .  Cervical fusion   Nov 2002   .  Hammer toe surgery   June 2007 & August 2008     left foot   .  Carpal tunnel release   09/30/2006     right wrist   .  Hand arthroplasty   Oct 2010     right thumb    .  Heart catherization   03/26/1999   .  Angioplasty   06/02/2000     Stent   .  Spine surgery   04/21/2004     Lower back disk gurgery- Lumbar stenosis   .  Abdominal angiogram   Dec. 4, 2013   .  Cardiac catheterization     .  Anterior lateral lumbar fusion 4 levels  Right  05/28/2014     Procedure: Right L4-5 L3-4 L2-3, L1-2 Anterior lateral lumbar fusion with percutaneaous pedicle screws. Lumbar four/five, three/four, two/three and possible two/one; Surgeon: Erline Levine, MD; Location: [facility]; Service: Neurosurgery; Laterality: Right; Lumbar One-Five Fusion with Percutaneous Screws   .  Lumbar percutaneous pedicle screw 4 level  N/A  05/28/2014     Procedure: LUMBAR PERCUTANEOUS PEDICLE SCREW 4 LEVEL; Surgeon: Erline Levine, MD; Location: [facility]; Service: Neurosurgery; Laterality: N/A;    History    Social History   .  Marital Status:  Married  Spouse Name:  N/A     Number of Children:  N/A   .  Years of Education:  N/A    Occupational History   .  retired     Social History Main Topics   .  Smoking status:  Former Smoker -- 2.00 packs/day for 28 years     Types:  Cigarettes     Quit date:  08/03/1987   .  Smokeless tobacco:  Never Used   .  Alcohol Use:  Yes      Comment: weekly   .  Drug Use:  No   .  Sexual Activity:  Not on file    Other Topics  Concern   .  Not on file    Social History Narrative   .  No narrative on file    Family History   Problem  Relation  Age of Onset   .  Heart disease  Father  47   .  Cancer  Father    .  Hyperlipidemia  Father    .  Hypertension  Father    .  Heart attack  Father    .  Cancer  Mother    .  Deep vein thrombosis  Mother      Varicose veins   .  Diabetes  Mother    .  Hyperlipidemia  Mother    .  Hypertension  Mother     Allergies as of 08/26/2014   .  (No Known Allergies)    Current Outpatient Prescriptions on File Prior to Visit   Medication  Sig  Dispense  Refill   .  albuterol (PROAIR HFA) 108 (90  BASE) MCG/ACT inhaler  Inhale 1-2 puffs into the lungs every 6 (six) hours as needed for wheezing or shortness of breath.  3 Inhaler  3   .  aspirin 325 MG tablet  Take 325 mg by mouth daily.     .  B Complex Vitamins (VITAMIN B COMPLEX PO)  Take 1 tablet by mouth 2 (two) times daily.     .  bisoprolol (ZEBETA) 5 MG tablet  Take 5 mg by mouth daily.     Marland Kitchen  CINNAMON PO  Take 2 tablets by mouth daily.     Marland Kitchen  doxazosin (CARDURA) 4 MG tablet  Take 2 mg by mouth at bedtime.     Marland Kitchen  doxycycline (ADOXA) 50 MG tablet  Take 50 mg by mouth daily.     .  fish oil-omega-3 fatty acids 1000 MG capsule  Take 3 g by mouth daily.     .  fluticasone (FLONASE) 50 MCG/ACT nasal spray  Place 2 sprays into the nose at bedtime as needed for allergies.     .  furosemide (LASIX) 20 MG tablet  Take 20 mg by mouth daily.     Marland Kitchen  GARLIC PO  Take 1 tablet by mouth daily.     Marland Kitchen  glipiZIDE (GLUCOTROL XL) 5 MG 24 hr tablet  Take 5 mg by mouth daily.     .  isosorbide mononitrate (IMDUR) 30 MG 24 hr tablet  Take 1 tablet (30 mg total) by mouth daily.  90 tablet  3   .  L-Lysine 1000 MG TABS  Take 1,000 mg by mouth daily.     Marland Kitchen  losartan (COZAAR) 100 MG tablet  Take 100 mg by mouth daily.     .  metFORMIN (GLUCOPHAGE) 1000 MG tablet  Take 1,000 mg  by mouth 2 (two) times daily with a meal.     .  MISC NATURAL PRODUCTS PO  Take 500 mg by mouth daily. TUMERIC capsules     .  Multiple Vitamin (MULTIVITAMIN) tablet  Take 1 tablet by mouth daily.     .  pentoxifylline (TRENTAL) 400 MG CR tablet  Take 1 tablet (400 mg total) by mouth 2 (two) times daily.  180 tablet  2   .  potassium chloride SA (K-DUR,KLOR-CON) 20 MEQ tablet  Take 20 mEq by mouth 2 (two) times daily.     Marland Kitchen  rOPINIRole (REQUIP) 1 MG tablet  Take 1 mg by mouth at bedtime.     .  simvastatin (ZOCOR) 40 MG tablet  Take 40 mg by mouth every evening.     .  tiotropium (SPIRIVA) 18 MCG inhalation capsule  Place 18 mcg into inhaler and inhale every other day.      No current  facility-administered medications on file prior to visit.   REVIEW OF SYSTEMS:  Please see history of present illness, otherwise negative  PHYSICAL EXAMINATION:  Vital signs are BP 111/58  Pulse 68  Temp(Src) 98.2 F (36.8 C) (Oral)  Resp 16  Ht 5\' 9"  (1.753 m)  Wt 250 lb (113.399 kg)  BMI 36.90 kg/m2  SpO2 99%  General: The patient appears their stated age.  HEENT: No gross abnormalities  Pulmonary: Non labored breathing  Abdomen: Soft and non-tender  Musculoskeletal: There are no major deformities.  Neurologic: No focal weakness or paresthesias are detected,  Skin: There are no ulcer or rashes noted.  Psychiatric: The patient has normal affect.  Cardiovascular: There is a regular rate and rhythm without significant murmur appreciated. No carotid bruits.  Diagnostic Studies  ABIs on the right had decreased. Today they are 0.36. One year ago it was 0.64. On the left there remained stable at 0.86  Assessment:  Bilateral claudication, right greater than left  Plan:  The patient's symptoms have progressed to the point where he cannot tolerate his disability. I feel the next step is to repeat his arteriogram to determine our surgical options. We discussed possibly just treating the right groin vs. below-knee bypass. This will depend on the angiographic findings. He'll need cardiac clearance before surgery. He will also be carotid Dopplers and vein mapping performed if we proceed with surgery. Again this will depend on the results of his arteriogram which has been scheduled for Tuesday, September 8      Addendum: The patient has had cardiac clearance.  He underwent angiography which revealed progression of the disease within the right groin.  I discussed via telephone proceeding with a right femoral endarterectomy with patch angioplasty.  The details of the procedure were discussed as well as the risks and benefits.  All the patient's questions were answered.  I offered him a clinic visit  to go over this again, however he was comfortable with this plan, as we had discussed it with his wife in the hospital and briefly in the office. Eldridge Abrahams, M.D.  [facility]  Office: [phone]  Pager: [phone]

## 2014-10-10 NOTE — Transfer of Care (Signed)
Immediate Anesthesia Transfer of Care Note  Patient: Anthony [name]  Procedure(s) Performed: Procedure(s): RIGHT FEMORAL ARTERY ENDARTERECTOMY  WITH VASCU GUARD PATCH ANGIOPLASTY (Right)  Patient Location: PACU  Anesthesia Type:General  Level of Consciousness: awake, alert , oriented and sedated  Airway & Oxygen Therapy: Patient Spontanous Breathing and Patient connected to nasal cannula oxygen  Post-op Assessment: Report given to PACU RN, Post -op Vital signs reviewed and stable and Patient moving all extremities  Post vital signs: Reviewed and stable  Complications: No apparent anesthesia complications

## 2014-10-10 NOTE — Anesthesia Preprocedure Evaluation (Addendum)
Anesthesia Evaluation  Patient identified by MRN, date of birth, ID band Patient awake    Reviewed: Allergy & Precautions, H&P , NPO status , Patient's Chart, lab work & pertinent test results, reviewed documented beta blocker date and time   History of Anesthesia Complications Negative for: history of anesthetic complications  Airway Mallampati: II TM Distance: >3 FB Neck ROM: Full    Dental  (+) Teeth Intact, Dental Advisory Given   Pulmonary sleep apnea and Continuous Positive Airway Pressure Ventilation , COPD COPD inhaler, former smoker (quit 1988),  breath sounds clear to auscultation        Cardiovascular hypertension, Pt. on medications and Pt. on home beta blockers + CAD, + Cardiac Stents and + Peripheral Vascular Disease Rhythm:Regular Rate:Normal  stress test on 10/18/13: Normal stress nuclear study. LV Ejection Fraction: 56%. LV Wall Motion: NL LV Function; NL Wall Motion   Neuro/Psych Chronic back pain    GI/Hepatic negative GI ROS, Neg liver ROS,   Endo/Other  diabetes (glu 153), Oral Hypoglycemic AgentsMorbid obesity  Renal/GU negative Renal ROS     Musculoskeletal   Abdominal (+) + obese,   Peds  Hematology negative hematology ROS (+)   Anesthesia Other Findings   Reproductive/Obstetrics                         Anesthesia Physical Anesthesia Plan  ASA: III  Anesthesia Plan: General   Post-op Pain Management:    Induction: Intravenous  Airway Management Planned: LMA  Additional Equipment:   Intra-op Plan:   Post-operative Plan:   Informed Consent: I have reviewed the patients History and Physical, chart, labs and discussed the procedure including the risks, benefits and alternatives for the proposed anesthesia with the patient or authorized representative who has indicated his/her understanding and acceptance.   Dental advisory given  Plan Discussed with: CRNA and  Surgeon  Anesthesia Plan Comments: (Plan routine monitors, GA- LMA OK)        Anesthesia Quick Evaluation

## 2014-10-10 NOTE — Anesthesia Postprocedure Evaluation (Signed)
  Anesthesia Post-op Note  Patient: Anthony [name]  Procedure(s) Performed: Procedure(s): RIGHT FEMORAL ARTERY ENDARTERECTOMY  WITH VASCU GUARD PATCH ANGIOPLASTY (Right)  Patient Location: PACU  Anesthesia Type:General  Level of Consciousness: awake, alert , oriented and patient cooperative  Airway and Oxygen Therapy: Patient Spontanous Breathing and Patient connected to nasal cannula oxygen  Post-op Pain: none  Post-op Assessment: Post-op Vital signs reviewed, Patient's Cardiovascular Status Stable, Respiratory Function Stable, Patent Airway, No signs of Nausea or vomiting and Pain level controlled  Post-op Vital Signs: Reviewed and stable  Last Vitals:  Filed Vitals:   10/10/14 1815  BP:   Pulse: 70  Temp:   Resp: 21    Complications: No apparent anesthesia complications

## 2014-10-10 NOTE — Op Note (Signed)
Patient name: Anthony [name] MRN: [id number] DOB: 11-01-1946 Sex: male  10/10/2014 Pre-operative Diagnosis: Right leg claudication Post-operative diagnosis:  Same Surgeon:  Eldridge Abrahams Assistants:  Gae Gallop, Gerri Lins, Virgina Jock Procedure:   Right external iliac, common femoral, and superficial femoral artery endarterectomy with bovine pericardial patch angioplasty Anesthesia:  Gen. Blood Loss:  See anesthesia record Specimens:  None  Findings:  Extensive circumferential plaque which extended up under the inguinal ligament.  I performed endarterectomy of the distal external iliac artery, common femoral, and superficial femoral artery.  I initially had difficulty with hemostasis when I made the arteriotomy as I could not get the vessels to occlude, and a Fogarty could not pass easily because of the stenosis.  The common femoral artery appeared nearly occluded.  Indications:  The patient has been to the medically for a while for a right leg claudication.  This has now progressed where he can no longer tolerate his symptoms.  Recent angiography revealed extensive plaque in the right common femoral artery.  He comes in for further revascularization.  Procedure:  The patient was identified in the holding area and taken to [facility] [address]  The patient was then placed supine on the table. general anesthesia was administered.  The patient was prepped and draped in the usual sterile fashion.  A time out was called and antibiotics were administered.  Longitudinal incision was made in the right groin.  Cautery was used to divide the subcutaneous tissue down to the femoral sheath.  The femoral sheath was then opened sharply.  I dissected out the common femoral artery.  This had circumferential calcium.  Using a Deaver retractor, I dissected out the distal external iliac artery.  Crossing venous branches under the inguinal ligament were divided.  I then dissected out the proximal  profunda femoral artery as well as about 3 cm of the superficial femoral artery.  The superficial femoral artery was also circumferentially calcified.  Once adequate exposure, the patient was fully heparinized.  After the heparin circulated, a Hanley clamp was placed on the distal external iliac artery.  The profunda femoral was occluded with vascular clamps as was the superficial femoral artery.  There was a soft spot in the middle of the common femoral artery which I made an arteriotomy with a #11 blade.  I extended this with Potts scissors.  There was significant bleeding after the arteriotomy.  I felt that it was coming  distally.  I tried to open the arteriotomy and perform endarterectomy so that I could get a clamp to occlude the vessel.  I tried to pass a Fogarty catheter and could not do this.  Ultimately, with manual pressure and manipulation I was able to obtain hemostasis.  I then performed an endarterectomy of the common femoral artery.  I was able to reach up and get all the plaque within the common femoral artery as well as the distal external iliac artery.  There became excellent pulsatile bleeding.  I then inspected the origin of the profunda femoral artery.  I removed any potential plaque that could cause a intimal flap and then tacked down the ostium with 6-0 Prolene sutures.  I then tried to find the distal endpoint in the proximal superficial femoral artery.  I had to open the superficial femoral artery approximately 3-4 cm.  There were significant islands of plaque and calcification which I removed.  The artery was calcified throughout what I could visualize.  Once I  found a good distal endpoint, I elected to proceed with patch angioplasty.  Bovine pericardial patch was selected.  Patch angioplasty was performed with a running 5-0 Prolene suture.  Prior to completion, the appropriate flushing maneuvers were performed through the anastomosis was then completed.  Hand-held Doppler was used to  evaluate the signals within the common femoral profunda femoral and superficial femoral artery.  All had excellent signals.  50 mg of protamine was then administered.  Once hemostasis was satisfactory, the femoral sheath was reapproximated with 2-0 Vicryl.  The subcutaneous tissue was then closed in multiple layers of 20 and 3-0 Vicryl.  4 Vicryl was used to close the skin, followed by Dermabond.  There were no immediate complications.  The patient tolerated the procedure well.   Disposition:  To PACU in stable condition.   Theotis Burrow, M.D. [facility] Office: [phone] Pager:  [phone]

## 2014-10-11 ENCOUNTER — Telehealth: Payer: Self-pay | Admitting: Surgery

## 2014-10-11 DIAGNOSIS — I70201 Unspecified atherosclerosis of native arteries of extremities, right leg: Secondary | ICD-10-CM

## 2014-10-11 DIAGNOSIS — I739 Peripheral vascular disease, unspecified: Secondary | ICD-10-CM

## 2014-10-11 LAB — BASIC METABOLIC PANEL
Anion gap: 11 (ref 5–15)
BUN: 22 mg/dL (ref 6–23)
CO2: 23 mEq/L (ref 19–32)
Calcium: 8.6 mg/dL (ref 8.4–10.5)
Chloride: 103 mEq/L (ref 96–112)
Creatinine, Ser: 0.96 mg/dL (ref 0.50–1.35)
GFR calc Af Amer: >90 mL/min (ref >90)
GFR calc non Af Amer: 82 mL/min — ABNORMAL LOW (ref >90)
Glucose, Bld: 266 mg/dL — ABNORMAL HIGH (ref 70–99)
Potassium: 4.9 mEq/L (ref 3.7–5.3)
Sodium: 137 mEq/L (ref 137–147)

## 2014-10-11 LAB — CBC
HCT: 35.9 % — ABNORMAL LOW (ref 39.0–52.0)
HCT: 35 % — ABNORMAL LOW (ref 39.0–52.0)
Hemoglobin: 11.9 g/dL — ABNORMAL LOW (ref 13.0–17.0)
Hemoglobin: 11.8 g/dL — ABNORMAL LOW (ref 13.0–17.0)
MCH: 30.1 pg (ref 26.0–34.0)
MCH: 30 pg (ref 26.0–34.0)
MCHC: 33.1 g/dL (ref 30.0–36.0)
MCHC: 33.7 g/dL (ref 30.0–36.0)
MCV: 90.9 fL (ref 78.0–100.0)
MCV: 89.1 fL (ref 78.0–100.0)
Platelets: 130 10*3/uL — ABNORMAL LOW (ref 150–400)
Platelets: 128 10*3/uL — ABNORMAL LOW (ref 150–400)
RBC: 3.95 MIL/uL — ABNORMAL LOW (ref 4.22–5.81)
RBC: 3.93 MIL/uL — ABNORMAL LOW (ref 4.22–5.81)
RDW: 13.3 % (ref 11.5–15.5)
RDW: 13.3 % (ref 11.5–15.5)
WBC: 7.1 10*3/uL (ref 4.0–10.5)
WBC: 8 10*3/uL (ref 4.0–10.5)

## 2014-10-11 LAB — CREATININE, SERUM
Creatinine, Ser: 0.99 mg/dL (ref 0.50–1.35)
GFR calc Af Amer: >90 mL/min (ref >90)
GFR calc non Af Amer: 81 mL/min — ABNORMAL LOW (ref >90)

## 2014-10-11 LAB — GLUCOSE, CAPILLARY
Glucose-Capillary: 175 mg/dL — ABNORMAL HIGH (ref 70–99)
Glucose-Capillary: 162 mg/dL — ABNORMAL HIGH (ref 70–99)
Glucose-Capillary: 176 mg/dL — ABNORMAL HIGH (ref 70–99)

## 2014-10-11 LAB — HEMOGLOBIN A1C
Hgb A1c MFr Bld: 7.1 % — ABNORMAL HIGH (ref <5.7)
Mean Plasma Glucose: 157 mg/dL — ABNORMAL HIGH (ref <117)

## 2014-10-11 MED ORDER — OXYCODONE-ACETAMINOPHEN 5-325 MG PO TABS: 1.0000 | ORAL_TABLET | ORAL | Status: DC | PRN

## 2014-10-11 NOTE — Progress Notes (Signed)
VASCULAR LAB PRELIMINARY  ARTERIAL  ABI completed:    RIGHT    LEFT    PRESSURE WAVEFORM  PRESSURE WAVEFORM  BRACHIAL 158 triphasic BRACHIAL 153 triphasic  DP   DP    AT 90 monophasic AT 112 monophasic  PT 87 monophasic PT 123 monophasic  PER   PER    GREAT TOE  NA GREAT TOE  NA    RIGHT LEFT  ABI 0.57 0.78  Today's study shows an increase in ABI post op on the right and a decrease on the left when compared with study from 08-26-14.  The study from 08-26-14 at [place] at [facility] shows an ABI of 0.36 on the right and 0.86 on the left.  Liliane Mallis, RVT 10/11/2014, 10:31 AM

## 2014-10-11 NOTE — Progress Notes (Signed)
  [facility] Progress Note  10/11/2014 8:14 AM 1 Day Post-Op  Subjective:  Having some soreness with incision. Otherwise, no complaints.   Tmax 98.3 BP sys 100s-150s 02 100% RA  Filed Vitals:   10/11/14 0500  BP: 102/59  Pulse: 78  Temp:   Resp: 21    Physical Exam: Incisions:  Right groin incision clean dry and intact. No hematoma. Extremities:  Good DP/ PT doppler signals bilaterally.   CBC    Component Value Date/Time   WBC 8.0 10/11/2014 0247   RBC 3.93* 10/11/2014 0247   HGB 11.8* 10/11/2014 0247   HCT 35.0* 10/11/2014 0247   PLT 128* 10/11/2014 0247   MCV 89.1 10/11/2014 0247   MCH 30.0 10/11/2014 0247   MCHC 33.7 10/11/2014 0247   RDW 13.3 10/11/2014 0247    BMET    Component Value Date/Time   NA 137 10/11/2014 0100   K 4.9 10/11/2014 0100   CL 103 10/11/2014 0100   CO2 23 10/11/2014 0100   GLUCOSE 266* 10/11/2014 0100   BUN 22 10/11/2014 0100   CREATININE 0.99 10/11/2014 0247   CALCIUM 8.6 10/11/2014 0100   GFRNONAA 81* 10/11/2014 0247   GFRAA >90 10/11/2014 0247    INR    Component Value Date/Time   INR 0.99 10/02/2014 0933     Intake/Output Summary (Last 24 hours) at 10/11/14 0814 Last data filed at 10/11/14 0400  Gross per 24 hour  Intake   2980 ml  Output   2575 ml  Net    405 ml     Assessment:  68 y.o. male is s/p:  Right external iliac, common femoral, and superficial femoral artery endarterectomy with bovine pericardial patch angioplasty  1 Day Post-Op  Plan: -Good doppler signals bilaterally. Incision looks fine.  -Acute surgical blood loss anemia: Hgb 11.8. Stable. Will monitor.  -BP stable 102/59.  -Ambulate in halls.  -Foley recently discontinued. Will wait for him to void.  -DVT prophylaxis: Lovenox -Dispo: Dr. [name] to see patient this morning. Anticipate discharge home today or tomorrow.   Virgina Jock, PA-C [facility] Office: [phone] Pager:  [phone] 10/11/2014 8:14 AM

## 2014-10-11 NOTE — Care Management Note (Signed)
    Page 1 of 1   10/11/2014     1:34:35 PM CARE MANAGEMENT NOTE 10/11/2014  Patient:  Anthony [name], Anthony [name]   Account Number:  [id number]  Date Initiated:  10/11/2014  Documentation initiated by:  Marvetta Gibbons  Subjective/Objective Assessment:   Pt admitted s/p femoral endarterectomy     Action/Plan:   PTA pt lived at home- plan to return home   Anticipated DC Date:  10/11/2014   Anticipated DC Plan:  HOME/SELF CARE         Choice offered to / List presented to:             Status of service:  Completed, signed off Medicare Important Message given?  NA - LOS <3 / Initial given by admissions (If response is "NO", the following Medicare IM given date fields will be blank) Date Medicare IM given:   Medicare IM given by:   Date Additional Medicare IM given:   Additional Medicare IM given by:    Discharge Disposition:  HOME/SELF CARE  Per UR Regulation:  Reviewed for med. necessity/level of care/duration of stay  If discussed at [place] of Stay Meetings, dates discussed:    Comments:

## 2014-10-11 NOTE — Evaluation (Signed)
Physical Therapy Evaluation Patient Details Name: Anthony [name] MRN: [id number] DOB: 05-29-1946 Today's Date: 10/11/2014   History of Present Illness  Pt is a 68 y.o. male presenting s/p R femoral artery endarterectomy on 10/10/14. Pt with hx of CAD, lumbar stenosis, DM, hyperlipidemia, CHF, COPD, CRF, and PVD.  Clinical Impression  PTA, pt independent with all functional mobility. Pt currently requires supervision with functional mobility without use of AD. D/c plan discussed with pt, supervision/assist will be available at d/c. Recommend d/c home with supervision, pt agreeable.  Pt does not require further skilled PT services, will sign off.    Follow Up Recommendations Supervision/Assistance - 24 hour    Equipment Recommendations  Other (comment)    Recommendations for Other Services       Precautions / Restrictions Restrictions Weight Bearing Restrictions: No      Mobility  Bed Mobility               General bed mobility comments: pt received in bed  Transfers Overall transfer level: Needs assistance Equipment used: Rolling walker (2 wheeled) Transfers: Sit to/from Stand Sit to Stand: Min guard (progressed to supervision without AD)         General transfer comment: pt progressed to supervision without AD. No instability, change in pain, or dizziness noted with standing. Pt demonstrates proper technique when using RW, but RW not necessary for stability.   Ambulation/Gait Ambulation/Gait assistance: Min guard;Supervision (progressed to supervision) Ambulation Distance (Feet): 250 Feet (150' with AD, 100' without AD) Assistive device: Rolling walker (2 wheeled);None Gait Pattern/deviations: Step-through pattern;WFL(Within Functional Limits) Gait velocity: improve without use of RW Gait velocity interpretation: at or above normal speed for age/gender General Gait Details: pt with improved gait speed without using RW. No instances of instability with or without  RW, no bouts of LOB with or without RW. Pt progressed from using  RW to not using RW with min guard progressing to supervision. Pt reports ambulation to be at baseline without use of AD.  Stairs            Wheelchair Mobility    Modified Rankin (Stroke Patients Only)       Balance Overall balance assessment: No apparent balance deficits (not formally assessed)                                           Pertinent Vitals/Pain Pain Assessment: No/denies pain    Home Living Family/patient expects to be discharged to:: Private residence Living Arrangements: Spouse/significant other Available Help at Discharge: Family;Available PRN/intermittently Type of Home: Other(Comment) (condo) Home Access: Level entry     Home Layout: One level Home Equipment: Walker - 2 wheels;Shower seat - built in      Prior Function Level of Independence: Independent               Hand Dominance   Dominant Hand: Right    Extremity/Trunk Assessment   Upper Extremity Assessment: Overall WFL for tasks assessed           Lower Extremity Assessment: Overall WFL for tasks assessed      Cervical / Trunk Assessment: Kyphotic  Communication   Communication: No difficulties  Cognition Arousal/Alertness: Awake/alert Behavior During Therapy: WFL for tasks assessed/performed Overall Cognitive Status: Within Functional Limits for tasks assessed  General Comments General comments (skin integrity, edema, etc.): Pt with bouts of Vtach during ambulation, quickly resolved with standing rest break, NSG informed. BP 150s/70s prior to start of session, NSG informed.  POC and d/c plan discussed with pt, pt agreeable. Recommended pt use RW when out in the community and PRN, but no further PT services required. Pt encouraged to perform ankle pumps throughout the day for circulation.    Exercises General Exercises - Lower Extremity Ankle Circles/Pumps:  AROM;Both;Seated (continuous 3 min)      Assessment/Plan    PT Assessment Patent does not need any further PT services  PT Diagnosis Difficulty walking   PT Problem List    PT Treatment Interventions     PT Goals (Current goals can be found in the Care Plan section) Acute Rehab PT Goals Patient Stated Goal: to go home PT Goal Formulation: With patient Time For Goal Achievement: 10/25/14 Potential to Achieve Goals: Good    Frequency     Barriers to discharge        Co-evaluation               End of Session Equipment Utilized During Treatment: Gait belt Activity Tolerance: Patient tolerated treatment well Patient left: in chair;with call bell/phone within reach Nurse Communication: Mobility status         Time: 2119-4174 PT Time Calculation (min): 13 min   Charges:         PT G Codes:          Salome Hautala 10/11/2014, 8:59 AM Levonne Hubert, SPT

## 2014-10-11 NOTE — Discharge Summary (Signed)
[facility] Discharge Summary  Anthony [name] 1946/11/21 68 y.o. male  [id number]  Admission Date: 10/10/2014  Discharge Date: 10/11/2014  Physician: Harold Barban, MD  Admission Diagnosis: Peripheral Vascular Disease with Claudication   HPI:   This is a 68 y.o. male who was followed by Dr. [name] for claudication, right greater than left. He has previously undergone angiography which revealed severe disease within his right common femoral and right popliteal artery, behind the knee. I have tried to manage him conservatively. He could not tolerate Pletal secondary to GI side effects. Trental was not very helpful. He states that over the past year his symptoms have progressed. He can now only walk about 40 feet before he gets cramping within his calf which takes approximately 15 minutes of rest to resolve. He denies rest pain or nonhealing wounds.  The patient is medically managed for hypercholesterolemia with a statin. He is a type II diabetic. His most recent hemoglobin A1c was in the 5.9 range. His blood pressure is managed with a ARB. He has not smoked since [facility] Course:  The patient was admitted to the hospital and taken to the operating room on 10/10/2014 and underwent: Right external iliac, common femoral, and superficial femoral artery endarterectomy with bovine pericardial patch angioplasty  The patient tolerated the procedure well and was transported to the PACU in stable condition.   By POD 1, the patient was doing well. He had good doppler signals bilaterally and his incision was healing well. No hematoma present. He was able to ambulate without difficulty. He was discharged home on POD 1 in good condition.    CBC    Component Value Date/Time   WBC 8.0 10/11/2014 0247   RBC 3.93* 10/11/2014 0247   HGB 11.8* 10/11/2014 0247   HCT 35.0* 10/11/2014 0247   PLT 128* 10/11/2014 0247   MCV 89.1 10/11/2014 0247   MCH 30.0 10/11/2014 0247   MCHC 33.7 10/11/2014 0247   RDW 13.3 10/11/2014 0247    BMET    Component Value Date/Time   NA 137 10/11/2014 0100   K 4.9 10/11/2014 0100   CL 103 10/11/2014 0100   CO2 23 10/11/2014 0100   GLUCOSE 266* 10/11/2014 0100   BUN 22 10/11/2014 0100   CREATININE 0.99 10/11/2014 0247   CALCIUM 8.6 10/11/2014 0100   GFRNONAA 81* 10/11/2014 0247   GFRAA >90 10/11/2014 0247     Discharge Instructions:   The patient is discharged to home with extensive instructions on wound care and progressive ambulation.  They are instructed not to drive or perform any heavy lifting until returning to see the physician in his office.  Discharge Instructions   Call MD for:  redness, tenderness, or signs of infection (pain, swelling, bleeding, redness, odor or green/yellow discharge around incision site)    Complete by:  As directed      Call MD for:  severe or increased pain, loss or decreased feeling  in affected limb(s)    Complete by:  As directed      Call MD for:  temperature >100.5    Complete by:  As directed      Driving Restrictions    Complete by:  As directed   No driving while on pain medication.     Lifting restrictions    Complete by:  As directed   No lifting for 1 week.     Resume previous diet    Complete by:  As directed  may wash over wound with mild soap and water    Complete by:  As directed   Keep groin clean and dry with dry gauze.           Discharge Diagnosis:  Peripheral Vascular Disease with Claudication  Secondary Diagnosis: Patient Active Problem List   Diagnosis Date Noted  . Femoral artery stenosis, right 10/10/2014  . Preoperative clearance 09/09/2014  . Lumbar spine scoliosis 05/28/2014  . Pain in limb 12/10/2013  . Chest tightness 09/27/2013  . HTN (hypertension) 09/27/2013  . Peripheral vascular disease, unspecified 01/08/2013  . PAD (peripheral artery disease) 11/13/2012  . Atherosclerosis of native arteries of the extremities with intermittent  claudication 11/13/2012  . Bilateral claudication of lower limb 10/09/2012  . Coronary atherosclerosis of native coronary artery 08/26/2012  . COPD (chronic obstructive pulmonary disease) 08/11/2012  . OSA (obstructive sleep apnea) 08/11/2012  . Diaphragm paralysis 08/11/2012  . Morbid obesity with BMI of 40.0-44.9, adult 08/11/2012   Past Medical History  Diagnosis Date  . CAD (coronary artery disease)     stent placed 06/02/2000  . Lumbar stenosis   . Diabetes mellitus   . Hyperlipidemia   . Congestive heart failure   . COPD (chronic obstructive pulmonary disease) 08/11/2012  . Chronic respiratory failure with hypoxia 08/11/2012  . Peripheral vascular disease   . OSA (obstructive sleep apnea) 08/11/2012    cpcap  . Hypertension   . Shortness of breath     with exertion  . Pneumonia 1999  . Arthritis        Medication List         albuterol 108 (90 BASE) MCG/ACT inhaler  Commonly known as:  PROAIR HFA  Inhale 1-2 puffs into the lungs every 6 (six) hours as needed for wheezing or shortness of breath.     aspirin 325 MG tablet  Take 325 mg by mouth daily.     bisoprolol 5 MG tablet  Commonly known as:  ZEBETA  Take 5 mg by mouth daily.     CINNAMON PO  Take 2 tablets by mouth daily.     doxazosin 4 MG tablet  Commonly known as:  CARDURA  Take 2 mg by mouth at bedtime.     doxycycline 50 MG tablet  Commonly known as:  ADOXA  Take 50 mg by mouth daily.     fish oil-omega-3 fatty acids 1000 MG capsule  Take 3 g by mouth daily.     furosemide 20 MG tablet  Commonly known as:  LASIX  Take 20 mg by mouth daily.     GARLIC PO  Take 1 tablet by mouth daily.     glipiZIDE 5 MG 24 hr tablet  Commonly known as:  GLUCOTROL XL  Take 5 mg by mouth daily.     isosorbide mononitrate 30 MG 24 hr tablet  Commonly known as:  IMDUR  Take 30 mg by mouth daily.     L-Lysine 1000 MG Tabs  Take 1,000 mg by mouth daily.     losartan 100 MG tablet  Commonly known as:   COZAAR  Take 100 mg by mouth daily.     metFORMIN 1000 MG tablet  Commonly known as:  GLUCOPHAGE  Take 1,000 mg by mouth 2 (two) times daily with a meal.     MISC NATURAL PRODUCTS PO  Take 500 mg by mouth daily. TUMERIC capsules     multivitamin tablet  Take 1 tablet by mouth daily.  oxyCODONE-acetaminophen 5-325 MG per tablet  Commonly known as:  PERCOCET/ROXICET  Take 1-2 tablets by mouth every 4 (four) hours as needed for moderate pain.     pentoxifylline 400 MG CR tablet  Commonly known as:  TRENTAL  Take 1 tablet (400 mg total) by mouth 2 (two) times daily.     potassium chloride SA 20 MEQ tablet  Commonly known as:  K-DUR,KLOR-CON  Take 20 mEq by mouth 2 (two) times daily.     rOPINIRole 1 MG tablet  Commonly known as:  REQUIP  Take 1 mg by mouth at bedtime.     simvastatin 40 MG tablet  Commonly known as:  ZOCOR  Take 40 mg by mouth every evening.     tiotropium 18 MCG inhalation capsule  Commonly known as:  SPIRIVA  Place 18 mcg into inhaler and inhale every other day.     VITAMIN B COMPLEX PO  Take 1 tablet by mouth 2 (two) times daily.        Percocet #30 No Refill  Disposition: Home  Patient's condition: is Good  Follow up: 1. Dr. [name] in 2 weeks   Virgina Jock, PA-C [facility] [phone] 10/11/2014  1:31 PM  - For VQI Registry use --- Instructions: Press F2 to tab through selections.  Delete question if not applicable.   Post-op:  Wound infection: No  Graft infection: No  Transfusion: No   New Arrhythmia: No Ipsilateral amputation: No, [ ]  Minor, [ ]  BKA, [ ]  AKA Discharge patency: [x]  Primary, [ ]  Primary assisted, [ ]  Secondary, [ ]  Occluded Patency judged by: [ x] Dopper only, [ ]  Palpable graft pulse, [ ]  Palpable distal pulse, [ ]  ABI inc. > 0.15, [ ]  Duplex Discharge ABI: R 0.57, L 0.78 D/C Ambulatory Status: Ambulatory  Complications: MI: No, [ ]  Troponin only, [ ]  EKG or Clinical CHF: No Resp  failure:No, [ ]  Pneumonia, [ ]  Ventilator Chg in renal function: No, [ ]  Inc. Cr > 0.5, [ ]  Temp. Dialysis, [ ]  Permanent dialysis Stroke: No, [ ]  Minor, [ ]  Major Return to OR: No  Reason for return to OR: [ ]  Bleeding, [ ]  Infection, [ ]  Thrombosis, [ ]  Revision  Discharge medications: Statin use:  yes ASA use:  yes Plavix use:  no Beta blocker use: yes Coumadin use: no

## 2014-10-11 NOTE — Progress Notes (Signed)
Pt. Discharge home per MD order.  Discharge instructions reviewed with patient and wife.  Pt. Stable at time of discharge.  Surgical site clean, dry, and intact.

## 2014-10-11 NOTE — Evaluation (Signed)
Patient mobilizing well, no physical assist required, no further acute PT needs, will sign off, patient in agreement. Alben Deeds, [place] DPT  [phone]

## 2014-10-11 NOTE — Telephone Encounter (Addendum)
Message copied by Gena Fray on Fri Oct 11, 2014 10:14 AM ------      Message from: Mena Goes      Created: Fri Oct 11, 2014  9:11 AM      Regarding: Schedule                   ----- Message -----         From: Alvia Grove, PA-C         Sent: 10/11/2014   8:25 AM           To: [facility] Charge Pool            S/p Right external iliac, common femoral, and superficial femoral artery endarterectomy with bovine pericardial patch angioplasty 10/11/14            F/u with Dr. [name] in 2 weeks.             Thanks      Kim ------  10/11/14: left msg for patient re appt, dpm

## 2014-10-14 NOTE — Progress Notes (Signed)
I agree with the above  Anthony [name] 

## 2014-10-15 ENCOUNTER — Encounter (HOSPITAL_COMMUNITY): Payer: Self-pay | Admitting: Surgery

## 2014-10-17 ENCOUNTER — Ambulatory Visit (INDEPENDENT_AMBULATORY_CARE_PROVIDER_SITE_OTHER): Payer: BC Managed Care – PPO | Admitting: Podiatrist

## 2014-10-17 DIAGNOSIS — M79676 Pain in unspecified toe(s): Secondary | ICD-10-CM

## 2014-10-17 DIAGNOSIS — I739 Peripheral vascular disease, unspecified: Secondary | ICD-10-CM

## 2014-10-17 DIAGNOSIS — B351 Tinea unguium: Secondary | ICD-10-CM

## 2014-10-17 NOTE — Progress Notes (Signed)
HPI: Patient presents today for follow up of foot and nail care. He has PVD and is being followed by Dr. Wells Brabham  Objective: Patients chart is reviewed. Neurovascular status unchanged with non palpable pedal pulses, delayed capillary refill time- He is being followed by Dr. Brabham. Patients nails are thickened, discolored, distrophic, friable and brittle with yellow-brown discoloration. Patient subjectively relates they are painful with shoes and with ambulation of bilateral feet.  Assessment: Symptomatic onychomycosis, PVD  Plan: Discussed treatment options and alternatives. The symptomatic toenails were debrided through manual an mechanical means without complication. He will be seen back in 3 months or as needed for followup. If any problems or concerns arise he will call.       

## 2014-10-28 ENCOUNTER — Ambulatory Visit (INDEPENDENT_AMBULATORY_CARE_PROVIDER_SITE_OTHER): Payer: Self-pay | Admitting: Surgery

## 2014-10-28 ENCOUNTER — Encounter: Payer: Self-pay | Admitting: Surgery

## 2014-10-28 VITALS — BP 106/50 | HR 63 | Ht 68.0 in | Wt 253.0 lb

## 2014-10-28 DIAGNOSIS — I739 Peripheral vascular disease, unspecified: Secondary | ICD-10-CM

## 2014-10-28 DIAGNOSIS — Z48812 Encounter for surgical aftercare following surgery on the circulatory system: Secondary | ICD-10-CM

## 2014-10-28 NOTE — Addendum Note (Signed)
Addended by: Mena Goes on: 10/28/2014 11:13 AM   Modules accepted: Orders

## 2014-10-28 NOTE — Progress Notes (Signed)
This is the patient's first postoperative visit.  He is status post right external iliac, common femoral, superficial femoral endarterectomy with bovine pericardial patch angioplasty.  The procedure was performed on 10/10/2014 for lifestyle limiting claudication which was not responsive to conservative treatment.  His postoperative course was uncomplicated.  He states that he is walking significantly better.  The distance has improved.  He no longer gets cramping in his calf.  On examination his groin incision is healing nicely.  The patient will follow-up in 3 months with a duplex and ABI.

## 2014-11-09 ENCOUNTER — Other Ambulatory Visit (HOSPITAL_COMMUNITY): Payer: Self-pay | Admitting: Internal Medicine

## 2014-11-12 ENCOUNTER — Other Ambulatory Visit (HOSPITAL_COMMUNITY): Payer: Self-pay | Admitting: Internal Medicine

## 2014-12-05 ENCOUNTER — Encounter (HOSPITAL_COMMUNITY): Payer: Self-pay | Admitting: Surgery

## 2014-12-16 ENCOUNTER — Ambulatory Visit: Payer: MEDICARE | Admitting: Surgery

## 2014-12-16 ENCOUNTER — Encounter (HOSPITAL_COMMUNITY): Payer: MEDICARE

## 2015-01-02 ENCOUNTER — Other Ambulatory Visit: Payer: Self-pay | Admitting: *Deleted

## 2015-01-02 DIAGNOSIS — I739 Peripheral vascular disease, unspecified: Secondary | ICD-10-CM

## 2015-01-02 MED ORDER — PENTOXIFYLLINE ER 400 MG PO TBCR: 400.0000 mg | EXTENDED_RELEASE_TABLET | Freq: Two times a day (BID) | ORAL | Status: DC

## 2015-01-06 ENCOUNTER — Other Ambulatory Visit: Payer: Self-pay | Admitting: *Deleted

## 2015-01-06 DIAGNOSIS — I739 Peripheral vascular disease, unspecified: Secondary | ICD-10-CM

## 2015-01-06 MED ORDER — PENTOXIFYLLINE ER 400 MG PO TBCR: 400.0000 mg | EXTENDED_RELEASE_TABLET | Freq: Two times a day (BID) | ORAL | Status: DC

## 2015-01-13 ENCOUNTER — Encounter: Payer: Self-pay | Admitting: Pulmonary Disease

## 2015-01-13 ENCOUNTER — Ambulatory Visit (INDEPENDENT_AMBULATORY_CARE_PROVIDER_SITE_OTHER): Payer: Medicare Other | Admitting: Pulmonary Disease

## 2015-01-13 VITALS — BP 134/70 | HR 59 | Temp 98.2°F | Ht 69.0 in | Wt 255.0 lb

## 2015-01-13 DIAGNOSIS — G4733 Obstructive sleep apnea (adult) (pediatric): Secondary | ICD-10-CM

## 2015-01-13 DIAGNOSIS — J986 Disorders of diaphragm: Secondary | ICD-10-CM

## 2015-01-13 DIAGNOSIS — J449 Chronic obstructive pulmonary disease, unspecified: Secondary | ICD-10-CM

## 2015-01-13 MED ORDER — TIOTROPIUM BROMIDE MONOHYDRATE 18 MCG IN CAPS: 18.0000 ug | ORAL_CAPSULE | RESPIRATORY_TRACT | Status: DC

## 2015-01-13 NOTE — Progress Notes (Addendum)
Chief Complaint  Patient presents with  . Follow-up    Pt stated he is wearing his CPAP nightly for 8-10 hours. Pt denies issues with mask, pressure or machine. Pt c/o DOE, mild prod cough. Pt denies CP/tightness.     History of Present Illness: Anthony [name] is a 69 y.o. male former smoker with dyspnea 2nd to COPD, right hemidiaphragm paralysis, and OSA on CPAP 13 cm H2O.  His breathing has been doing well.  He uses his CPAP every night.  He has occasional cough with clear sputum.  He is very active since having circulation in leg treated.  He denies chest pain, wheeze.  He uses spiriva qod, and is not using albuterol.  Tests: PFT 08/13/12>>FEV1 1.83 (62%), FEV1% 64, TLC 5.17 (82%), DLCO 67%, +BD SNIFF test 08/14/12>>Paradoxical motion of the right hemidiaphragm with inspiration.  PSG 08/14/12>>AHI 38.5.  CPAP 13 cm H2O with 1 liter oxygen. Echo 10/09/12>>mild LVH, EF 50 to 56%, grade 1 diastolic dysfx, mild LA dilation, PAS 35 mmHg ONO with CPAP and RA 01/18/13 >> test time 10 hrs 37 min. Mean SpO2 94%, low SpO2 86%. Spent 16 sec with SpO2 < 88%. CPAP 10/11/14 to 01/08/15 >> used on 90 of 90 nights with average 8 hrs and 34 min.  Average AHI is 0.7 with CPAP 13 cm H2O.  PMHx >>CAD, HLD, CHF, PAD, HTN, DM  PSHx, Medications, Allergies, Fhx, Shx reviewed.  Physical Exam: Blood pressure 134/70, pulse 59, temperature 98.2 F (36.8 C), temperature source Oral, height 5\' 9"  (1.753 m), weight 255 lb (115.667 kg), SpO2 98 %. Body mass index is 37.64 kg/(m^2).  General - No distress ENT - No sinus tenderness, no oral exudate, no LAN, MP 3 Cardiac - s1s2 regular, no murmur Chest - decreased breath sounds, prolonged exhalation, no wheeze/rales/dullness  Back - no focal tenderness  Abd - soft, non-tender Ext - no edema Neuro - normal strength Skin - no rashes Psych - normal mood, and behavior   Assessment/Plan:  COPD with chronic bronchitis. Plan: - continue spiriva qod with prn  albuterol  Obstructive sleep apnea. He is compliant with therapy and reports benefit from CPAP. Plan: - continue CPAP 13 cm H2O  Right diaphragm paralysis. Plan: - monitor clinically on current tx   [name], MD [place] [facility] Pager:  [phone] 01/13/2015, 4:52 PM

## 2015-01-13 NOTE — Patient Instructions (Signed)
Follow up in 1 year.

## 2015-01-16 ENCOUNTER — Encounter: Payer: Self-pay | Admitting: Podiatrist

## 2015-01-16 ENCOUNTER — Ambulatory Visit (INDEPENDENT_AMBULATORY_CARE_PROVIDER_SITE_OTHER): Payer: BLUE CROSS/BLUE SHIELD | Admitting: Podiatrist

## 2015-01-16 DIAGNOSIS — M79676 Pain in unspecified toe(s): Secondary | ICD-10-CM

## 2015-01-16 DIAGNOSIS — I739 Peripheral vascular disease, unspecified: Secondary | ICD-10-CM

## 2015-01-16 DIAGNOSIS — B351 Tinea unguium: Secondary | ICD-10-CM

## 2015-01-16 NOTE — Progress Notes (Signed)
HPI: Patient presents today for follow up of foot and nail care. He has PVD and is being followed by Dr. Annamarie Major  Objective: Patients chart is reviewed. Neurovascular status unchanged with non palpable pedal pulses, delayed capillary refill time- He is being followed by Dr. [name]. Patients nails are thickened, discolored, distrophic, friable and brittle with yellow-brown discoloration. Patient subjectively relates they are painful with shoes and with ambulation of bilateral feet.  Assessment: Symptomatic onychomycosis, PVD  Plan: Discussed treatment options and alternatives. The symptomatic toenails were debrided through manual an mechanical means without complication. He will be seen back in 3 months or as needed for followup. If any problems or concerns arise he will call.

## 2015-01-16 NOTE — Patient Instructions (Signed)
Diabetes and Foot Care Diabetes may cause you to have problems because of poor blood supply (circulation) to your feet and legs. This may cause the skin on your feet to become thinner, break easier, and heal more slowly. Your skin may become dry, and the skin may peel and crack. You may also have nerve damage in your legs and feet causing decreased feeling in them. You may not notice minor injuries to your feet that could lead to infections or more serious problems. Taking care of your feet is one of the most important things you can do for yourself.  HOME CARE INSTRUCTIONS  Wear shoes at all times, even in the house. Do not go barefoot. Bare feet are easily injured.  Check your feet daily for blisters, cuts, and redness. If you cannot see the bottom of your feet, use a mirror or ask someone for help.  Wash your feet with warm water (do not use hot water) and mild soap. Then pat your feet and the areas between your toes until they are completely dry. Do not soak your feet as this can dry your skin.  Apply a moisturizing lotion or petroleum jelly (that does not contain alcohol and is unscented) to the skin on your feet and to dry, brittle toenails. Do not apply lotion between your toes.  Trim your toenails straight across. Do not dig under them or around the cuticle. File the edges of your nails with an emery board or nail file.  Do not cut corns or calluses or try to remove them with medicine.  Wear clean socks or stockings every day. Make sure they are not too tight. Do not wear knee-high stockings since they may decrease blood flow to your legs.  Wear shoes that fit properly and have enough cushioning. To break in new shoes, wear them for just a few hours a day. This prevents you from injuring your feet. Always look in your shoes before you put them on to be sure there are no objects inside.  Do not cross your legs. This may decrease the blood flow to your feet.  If you find a minor scrape,  cut, or break in the skin on your feet, keep it and the skin around it clean and dry. These areas may be cleansed with mild soap and water. Do not cleanse the area with peroxide, alcohol, or iodine.  When you remove an adhesive bandage, be sure not to damage the skin around it.  If you have a wound, look at it several times a day to make sure it is healing.  Do not use heating pads or hot water bottles. They may burn your skin. If you have lost feeling in your feet or legs, you may not know it is happening until it is too late.  Make sure your health care provider performs a complete foot exam at least annually or more often if you have foot problems. Report any cuts, sores, or bruises to your health care provider immediately. SEEK MEDICAL CARE IF:   You have an injury that is not healing.  You have cuts or breaks in the skin.  You have an ingrown nail.  You notice redness on your legs or feet.  You feel burning or tingling in your legs or feet.  You have pain or cramps in your legs and feet.  Your legs or feet are numb.  Your feet always feel cold. SEEK IMMEDIATE MEDICAL CARE IF:   There is increasing redness,   swelling, or pain in or around a wound.  There is a red line that goes up your leg.  Pus is coming from a wound.  You develop a fever or as directed by your health care provider.  You notice a bad smell coming from an ulcer or wound. Document Released: 12/10/2000 Document Revised: 08/15/2013 Document Reviewed: 05/22/2013 Exit[employer] Patient Information 2015 [employer]. This information is not intended to replace advice given to you by your health care provider. Make sure you discuss any questions you have with your health care provider.  

## 2015-02-03 ENCOUNTER — Other Ambulatory Visit (HOSPITAL_COMMUNITY): Payer: MEDICARE

## 2015-02-03 ENCOUNTER — Encounter (HOSPITAL_COMMUNITY): Payer: MEDICARE

## 2015-02-03 ENCOUNTER — Ambulatory Visit: Payer: MEDICARE | Admitting: Surgery

## 2015-04-04 ENCOUNTER — Encounter: Payer: Self-pay | Admitting: Surgery

## 2015-04-07 ENCOUNTER — Ambulatory Visit (HOSPITAL_COMMUNITY)
Admission: RE | Admit: 2015-04-07 | Discharge: 2015-04-07 | Disposition: A | Discharge status: Home / Self Care | Payer: Medicare Other | Source: Ambulatory Visit | Attending: Family | Admitting: Family

## 2015-04-07 ENCOUNTER — Ambulatory Visit (INDEPENDENT_AMBULATORY_CARE_PROVIDER_SITE_OTHER)
Admission: RE | Admit: 2015-04-07 | Discharge: 2015-04-07 | Disposition: A | Discharge status: Home / Self Care | Payer: Medicare Other | Source: Ambulatory Visit | Attending: Surgery | Admitting: Surgery

## 2015-04-07 ENCOUNTER — Ambulatory Visit (INDEPENDENT_AMBULATORY_CARE_PROVIDER_SITE_OTHER): Payer: Medicare Other | Admitting: Family

## 2015-04-07 ENCOUNTER — Ambulatory Visit: Payer: MEDICARE | Admitting: Surgery

## 2015-04-07 ENCOUNTER — Other Ambulatory Visit (HOSPITAL_COMMUNITY): Payer: MEDICARE

## 2015-04-07 ENCOUNTER — Other Ambulatory Visit: Payer: Self-pay | Admitting: *Deleted

## 2015-04-07 ENCOUNTER — Encounter: Payer: Self-pay | Admitting: Family

## 2015-04-07 ENCOUNTER — Encounter (HOSPITAL_COMMUNITY): Payer: MEDICARE

## 2015-04-07 VITALS — BP 131/61 | HR 54 | Resp 14 | Ht 69.0 in | Wt 256.0 lb

## 2015-04-07 DIAGNOSIS — Z9889 Other specified postprocedural states: Secondary | ICD-10-CM | POA: Diagnosis not present

## 2015-04-07 DIAGNOSIS — Z87891 Personal history of nicotine dependence: Secondary | ICD-10-CM

## 2015-04-07 DIAGNOSIS — I739 Peripheral vascular disease, unspecified: Secondary | ICD-10-CM

## 2015-04-07 DIAGNOSIS — E785 Hyperlipidemia, unspecified: Secondary | ICD-10-CM | POA: Insufficient documentation

## 2015-04-07 DIAGNOSIS — E119 Type 2 diabetes mellitus without complications: Secondary | ICD-10-CM | POA: Insufficient documentation

## 2015-04-07 DIAGNOSIS — Z48812 Encounter for surgical aftercare following surgery on the circulatory system: Secondary | ICD-10-CM

## 2015-04-07 DIAGNOSIS — Z9862 Peripheral vascular angioplasty status: Secondary | ICD-10-CM

## 2015-04-07 DIAGNOSIS — I10 Essential (primary) hypertension: Secondary | ICD-10-CM | POA: Insufficient documentation

## 2015-04-07 NOTE — Patient Instructions (Signed)

## 2015-04-07 NOTE — Progress Notes (Signed)
[facility] [place] HISTORY AND PHYSICAL -PAD  History of Present Illness Anthony [name] is a 69 y.o. male patient of Dr. [name] who returns today for 3 months follow up status post right external iliac, common femoral, superficial femoral endarterectomy with bovine pericardial patch angioplasty on 10/10/2014 for lifestyle limiting claudication which was not responsive to conservative treatment. His postoperative course was uncomplicated. He states that he is walking significantly better. The distance has improved. He no longer gets cramping in his calf. He walks about 2-4 miles total daily as confirmed by his fit bit.  He report chronic lumbar and c-spine issues. He denies non healing wounds.   He denies any history of stroke or TIA. The patient denies New Medical or Surgical History.  Pt Diabetic: Yes, states in good control with and A1C of 6.3. Pt smoker: former smoker, quit in 1989  Pt meds include: Statin :Yes Betablocker: No ASA: Yes Other anticoagulants/antiplatelets: no  Past Medical History  Diagnosis Date  . CAD (coronary artery disease)     stent placed 06/02/2000  . Lumbar stenosis   . Diabetes mellitus   . Hyperlipidemia   . Congestive heart failure   . COPD (chronic obstructive pulmonary disease) 08/11/2012  . Chronic respiratory failure with hypoxia 08/11/2012  . Peripheral vascular disease   . OSA (obstructive sleep apnea) 08/11/2012    cpcap  . Hypertension   . Shortness of breath     with exertion  . Pneumonia 1999  . Arthritis     Social History History  Substance Use Topics  . Smoking status: Former Smoker -- 2.00 packs/day for 28 years    Types: Cigarettes    Quit date: 08/03/1987  . Smokeless tobacco: Never Used  . Alcohol Use: Yes     Comment: weekly    Family History Family History  Problem Relation Age of Onset  . Heart disease Father 82  . Cancer Father   . Hyperlipidemia Father   . Hypertension Father   .  Heart attack Father   . Cancer Mother   . Deep vein thrombosis Mother     Varicose veins  . Diabetes Mother   . Hyperlipidemia Mother   . Hypertension Mother     Past Surgical History  Procedure Laterality Date  . Tonsillectomy  1965  . [place]    right foot  . Neuroma surgery  1996    right foot  . Lumbar disc surgery  Dec 1997 & Ampril 2005  . Cervical fusion  Nov 2002  . Hammer toe surgery  June 2007 & August 2008    left foot  . Carpal tunnel release  09/30/2006    right wrist  . Hand arthroplasty  Oct 2010    right thumb  . Heart catherization  03/26/1999  . Angioplasty  06/02/2000    Stent  . Spine surgery  04/21/2004    Lower back disk gurgery- Lumbar stenosis  . Abdominal angiogram  Dec. 4, 2013  . Cardiac catheterization    . Anterior lateral lumbar fusion 4 levels Right 05/28/2014    Procedure: Right L4-5 L3-4 L2-3, L1-2  Anterior lateral lumbar fusion with percutaneaous pedicle screws. Lumbar four/five, three/four, two/three and possible two/one;  Surgeon: Erline Levine, MD;  Location: [facility];  Service: Neurosurgery;  Laterality: Right;  Lumbar One-Five Fusion with Percutaneous Screws  . Lumbar percutaneous pedicle screw 4 level N/A 05/28/2014    Procedure: LUMBAR PERCUTANEOUS PEDICLE SCREW  4 LEVEL;  Surgeon: Erline Levine, MD;  Location: [facility];  Service: Neurosurgery;  Laterality: N/A;  . Eye surgery Bilateral 2014    cataracts  . Colonoscopy w/ biopsies and polypectomy      benign  . Endarterectomy femoral Right 10/10/2014    Procedure: RIGHT FEMORAL ARTERY ENDARTERECTOMY  WITH VASCU GUARD PATCH ANGIOPLASTY;  Surgeon: Serafina Mitchell, MD;  Location: [facility];  Service: Vascular;  Laterality: Right;  . Abdominal aortagram N/A 11/29/2012    Procedure: ABDOMINAL Maxcine Ham;  Surgeon: Serafina Mitchell, MD;  Location: [facility] CATH LAB;  Service: Cardiovascular;  Laterality: N/A;  . Abdominal aortagram N/A 09/03/2014    Procedure: ABDOMINAL AORTAGRAM;   Surgeon: Serafina Mitchell, MD;  Location: [facility] CATH LAB;  Service: Cardiovascular;  Laterality: N/A;    No Known Allergies  Current Outpatient Prescriptions  Medication Sig Dispense Refill  . albuterol (PROAIR HFA) 108 (90 BASE) MCG/ACT inhaler Inhale 1-2 puffs into the lungs every 6 (six) hours as needed for wheezing or shortness of breath. 3 Inhaler 3  . aspirin 325 MG tablet Take 325 mg by mouth daily.    . B Complex Vitamins (VITAMIN B COMPLEX PO) Take 1 tablet by mouth 2 (two) times daily.     . bisoprolol (ZEBETA) 5 MG tablet TAKE 1 TABLET by mouth daily 90 tablet 2  . CINNAMON PO Take 2 tablets by mouth daily.    Marland Kitchen doxazosin (CARDURA) 4 MG tablet Take 2 mg by mouth at bedtime.     Marland Kitchen doxycycline (ADOXA) 50 MG tablet Take 50 mg by mouth daily.    . fish oil-omega-3 fatty acids 1000 MG capsule Take 3 g by mouth daily.     . furosemide (LASIX) 20 MG tablet Take 20 mg by mouth daily.     Marland Kitchen GARLIC PO Take 1 tablet by mouth daily.    Marland Kitchen glipiZIDE (GLUCOTROL XL) 5 MG 24 hr tablet Take 5 mg by mouth daily.    . isosorbide mononitrate (IMDUR) 30 MG 24 hr tablet TAKE 1 TABLET BY MOUTH DAILY 90 tablet 2  . L-Lysine 1000 MG TABS Take 1,000 mg by mouth daily.     Marland Kitchen losartan (COZAAR) 100 MG tablet Take 100 mg by mouth daily.    . metFORMIN (GLUCOPHAGE) 1000 MG tablet Take 1,000 mg by mouth 2 (two) times daily with a meal.    . MISC NATURAL PRODUCTS PO Take 500 mg by mouth daily. TUMERIC capsules    . Multiple Vitamin (MULTIVITAMIN) tablet Take 1 tablet by mouth daily.    Marland Kitchen oxyCODONE-acetaminophen (PERCOCET/ROXICET) 5-325 MG per tablet Take 1-2 tablets by mouth every 4 (four) hours as needed for moderate pain. 30 tablet 0  . pentoxifylline (TRENTAL) 400 MG CR tablet Take 1 tablet (400 mg total) by mouth 2 (two) times daily. 180 tablet 4  . potassium chloride SA (K-DUR,KLOR-CON) 20 MEQ tablet Take 20 mEq by mouth 2 (two) times daily.    Marland Kitchen rOPINIRole (REQUIP) 1 MG tablet Take 1 mg by mouth at bedtime.     . simvastatin (ZOCOR) 40 MG tablet Take 40 mg by mouth every evening.    . tiotropium (SPIRIVA) 18 MCG inhalation capsule Place 1 capsule (18 mcg total) into inhaler and inhale every other day. 90 capsule 3   No current facility-administered medications for this visit.    ROS: See HPI for pertinent positives and negatives.   Physical Examination  Filed Vitals:   04/07/15 1104  BP: 131/61  Pulse: 54  Resp: 14  Height: 5\' 9"  (1.753 m)  Weight: 256 lb (116.121 kg)  SpO2: 95%   Body mass index is 37.79 kg/(m^2).  General: A&O x 3, WDWN. Gait: normal Eyes: PERRLA. Pulmonary: CTAB, without wheezes , rales or rhonchi. Cardiac: regular Rythm , without detected murmur.         Carotid Bruits Right Left   Negative Negative  Aorta is not palpable. Radial pulses: 2+ palpable and =                           VASCULAR EXAM: Extremities without ischemic changes, without Gangrene; without open wounds. Left great toenail and left second toenail surgically absent.                                                                                                          LE Pulses Right Left       FEMORAL   palpable  not palpable        POPLITEAL  not palpable   not palpable       POSTERIOR TIBIAL  not palpable   not palpable        DORSALIS PEDIS      ANTERIOR TIBIAL not palpable  faintly palpable    Abdomen: soft, NT, no palpable masses. Skin: no rashes, no ulcers. Musculoskeletal: no muscle wasting or atrophy.  Neurologic: A&O X 3; Appropriate Affect ; SENSATION: normal; MOTOR FUNCTION:  moving all extremities equally, motor strength 5/5 throughout. Speech is fluent/normal.  CN 2-12 intact.    Non-Invasive Vascular Imaging: DATE: 04/07/2015 LOWER EXTREMITY ARTERIAL DUPLEX EVALUATION    INDICATION: Peripheral vascular disease    PREVIOUS INTERVENTION(S): Right femoral endarterectomy 10/10/2014    DUPLEX EXAM:     RIGHT  LEFT   Peak Systolic Velocity (cm/s) Ratio (if  abnormal) Waveform  Peak Systolic Velocity (cm/s) Ratio (if abnormal) Waveform  104  T Common Femoral Artery     108  T Deep Femoral Artery     46/370 8.0 B Superficial Femoral Artery Proximal     94  B Superficial Femoral Artery Mid     99  B Superficial Femoral Artery Distal     31/256 8.2 M Popliteal Artery     16  M Posterior Tibial Artery Dist     28  M Anterior Tibial Artery Distal     NV  A Peroneal Artery Distal     .76/.43 Today's ABI / TBI .87/.69  .64/.36 Previous ABI / TBI () .83/.86    Waveform:    M - Monophasic       B - Biphasic       T - Triphasic  If Ankle Brachial Index (ABI) or Toe Brachial Index (TBI) performed, please see complete report     ADDITIONAL FINDINGS: The left lower extremity was not imaged during this procedure.    IMPRESSION: 1. Greater than 50% diameter reduction of the right proximal SFA and proximal popliteal arteries.   2. The  right peroneal artery was unable to be documented which may suggest complete occlusion.    Compared to the previous exam:  No previous ultrasounds available for comparison.     ASSESSMENT: Anthony [name] is a 69 y.o. male who is status post right external iliac, common femoral, superficial femoral endarterectomy with bovine pericardial patch angioplasty on 10/10/2014 for lifestyle limiting claudication which was not responsive to conservative treatment. He no longer has claudication symptoms with walking, no tissue loss.  He walks about 2-4 miles total daily as confirmed by his fit bit.  His DM is under control and he quit smoking in 1989. Today's right LE arterial Duplex suggests greater than 50% diameter reduction of the right proximal SFA and proximal popliteal arteries.   The right peroneal artery was unable to be documented which may suggest complete occlusion. Bilateral ABI's have improved since he is able to walk so much farther. He feels well and much improved.   PLAN:  I discussed in depth with the  patient the nature of atherosclerosis, and emphasized the importance of maximal medical management including strict control of blood pressure, blood glucose, and lipid levels, obtaining regular exercise, and continued cessation of smoking.  The patient is aware that without maximal medical management the underlying atherosclerotic disease process will progress, limiting the benefit of any interventions.  Based on the patient's vascular studies and examination, and after Dr. [name] spoke with pt pt will return to clinic in 6 months with ABI's and right LE arterial Duplex.  The patient was given information about PAD including signs, symptoms, treatment, what symptoms should prompt the patient to seek immediate medical care, and risk reduction measures to take.  Clemon Chambers, RN, MSN, FNP-C [facility] Phone: [phone]  Clinic MD: [name]  04/07/2015 11:20 AM

## 2015-04-17 ENCOUNTER — Other Ambulatory Visit (HOSPITAL_COMMUNITY): Payer: Self-pay | Admitting: Cardiology

## 2015-04-17 ENCOUNTER — Ambulatory Visit: Payer: BLUE CROSS/BLUE SHIELD | Admitting: Podiatrist

## 2015-04-17 MED ORDER — DOXAZOSIN MESYLATE 4 MG PO TABS: 2.0000 mg | ORAL_TABLET | Freq: Every day | ORAL | Status: DC

## 2015-04-18 ENCOUNTER — Ambulatory Visit (INDEPENDENT_AMBULATORY_CARE_PROVIDER_SITE_OTHER): Payer: Medicare Other

## 2015-04-18 DIAGNOSIS — M79673 Pain in unspecified foot: Secondary | ICD-10-CM | POA: Diagnosis not present

## 2015-04-18 DIAGNOSIS — B351 Tinea unguium: Secondary | ICD-10-CM

## 2015-04-18 DIAGNOSIS — M79676 Pain in unspecified toe(s): Secondary | ICD-10-CM

## 2015-04-18 NOTE — Progress Notes (Signed)
HPI: Patient presents today for follow up of foot and nail care. He has PVD and is being followed by Dr. Annamarie Major  Objective: Patients chart is reviewed. Neurovascular status unchanged with non palpable pedal pulses, delayed capillary refill time- He is being followed by Dr. [name]. Patients nails are thickened, discolored, distrophic, friable and brittle with yellow-brown discoloration. Patient subjectively relates they are painful with shoes and with ambulation of bilateral feet.  Assessment: Symptomatic onychomycosis, PVD  Plan: Discussed treatment options and alternatives. The symptomatic toenails were debrided through manual an mechanical means without complication. He will be seen back in 3 months or as needed for followup. If any problems or concerns arise he will call.

## 2015-05-06 ENCOUNTER — Ambulatory Visit (HOSPITAL_COMMUNITY)
Admission: RE | Admit: 2015-05-06 | Discharge: 2015-05-06 | Disposition: A | Discharge status: Home / Self Care | Payer: Medicare Other | Source: Ambulatory Visit | Attending: Internal Medicine | Admitting: Internal Medicine

## 2015-05-06 ENCOUNTER — Encounter (HOSPITAL_COMMUNITY): Payer: Self-pay

## 2015-05-06 VITALS — BP 134/72 | HR 62 | Wt 260.0 lb

## 2015-05-06 DIAGNOSIS — Z955 Presence of coronary angioplasty implant and graft: Secondary | ICD-10-CM | POA: Insufficient documentation

## 2015-05-06 DIAGNOSIS — Z833 Family history of diabetes mellitus: Secondary | ICD-10-CM | POA: Insufficient documentation

## 2015-05-06 DIAGNOSIS — J449 Chronic obstructive pulmonary disease, unspecified: Secondary | ICD-10-CM | POA: Insufficient documentation

## 2015-05-06 DIAGNOSIS — I5032 Chronic diastolic (congestive) heart failure: Secondary | ICD-10-CM | POA: Diagnosis not present

## 2015-05-06 DIAGNOSIS — E119 Type 2 diabetes mellitus without complications: Secondary | ICD-10-CM | POA: Insufficient documentation

## 2015-05-06 DIAGNOSIS — I739 Peripheral vascular disease, unspecified: Secondary | ICD-10-CM | POA: Insufficient documentation

## 2015-05-06 DIAGNOSIS — Z8249 Family history of ischemic heart disease and other diseases of the circulatory system: Secondary | ICD-10-CM | POA: Insufficient documentation

## 2015-05-06 DIAGNOSIS — I2510 Atherosclerotic heart disease of native coronary artery without angina pectoris: Secondary | ICD-10-CM | POA: Diagnosis not present

## 2015-05-06 DIAGNOSIS — M1990 Unspecified osteoarthritis, unspecified site: Secondary | ICD-10-CM | POA: Insufficient documentation

## 2015-05-06 DIAGNOSIS — Z7982 Long term (current) use of aspirin: Secondary | ICD-10-CM | POA: Diagnosis not present

## 2015-05-06 DIAGNOSIS — Z87891 Personal history of nicotine dependence: Secondary | ICD-10-CM | POA: Insufficient documentation

## 2015-05-06 DIAGNOSIS — I10 Essential (primary) hypertension: Secondary | ICD-10-CM | POA: Diagnosis not present

## 2015-05-06 DIAGNOSIS — G4733 Obstructive sleep apnea (adult) (pediatric): Secondary | ICD-10-CM | POA: Diagnosis not present

## 2015-05-06 DIAGNOSIS — Z79899 Other long term (current) drug therapy: Secondary | ICD-10-CM | POA: Diagnosis not present

## 2015-05-06 LAB — BASIC METABOLIC PANEL
Anion gap: 7 (ref 5–15)
BUN: 24 mg/dL — ABNORMAL HIGH (ref 6–20)
CO2: 23 mmol/L (ref 22–32)
Calcium: 9.3 mg/dL (ref 8.9–10.3)
Chloride: 108 mmol/L (ref 101–111)
Creatinine, Ser: 1.22 mg/dL (ref 0.61–1.24)
GFR calc Af Amer: >60 mL/min (ref >60)
GFR calc non Af Amer: 58 mL/min — ABNORMAL LOW (ref >60)
Glucose, Bld: 168 mg/dL — ABNORMAL HIGH (ref 70–99)
Potassium: 4.8 mmol/L (ref 3.5–5.1)
Sodium: 138 mmol/L (ref 135–145)

## 2015-05-06 MED ORDER — ISOSORBIDE MONONITRATE ER 30 MG PO TB24: 30.0000 mg | ORAL_TABLET | Freq: Every day | ORAL | Status: DC

## 2015-05-06 MED ORDER — BISOPROLOL FUMARATE 5 MG PO TABS: 5.0000 mg | ORAL_TABLET | Freq: Every day | ORAL | Status: DC

## 2015-05-06 NOTE — Progress Notes (Signed)
Patient ID: Anthony [name], male   DOB: [birth year], 69 y.o.   MRN: [id number]  PCP: [name], MD  HPI: Anthony [name] is a 69 y/o male with h/o obesity, HTN, HL, severe osteoarthritis, DM2 (diagnosed 2009), OSA (intolerant of CPAP), former smoker (quit 1989), CAD s/p stent 2001 and PAD. He is the father-in-law of Dr. [name] in Interventional Radiology.    Had cath in [place] in 2001. Was told he had 70% blockage in one artery and the one in the back was totally blocked. Eventually underwent stenting of the 70%. No caths since. Myoview 10/14: Normal. LV Ejection Fraction: 56%. LV Wall Motion: NL LV Function; NL Wall Motion  PFTs with moderate COPD FEV1 1.83 (62%) FVC   2.84 (66%) FEF 25-75% 0.79 (29%) DLCO 67%  Saw Dr. Halford Chessman and placed on home O2 but now weaned off with inhalers. Checks sats with pulse oximeter  ECHO 10/13 EF 50-55%  CXR with elevated R hemidiaphragm. Sleep study with severe OS - AHI 39. Weight initially 285 pounds lost down to 235. Now back at 245.    Has severe PAD R>L followed by Dr. [name]. Treating medically for now. Now on [place].   He returns for yearly follow up. Overall feeling pretty good. Mild dyspnea with exertion. Denies PND/Orthopnea/CP .  Walking 15-16 miles per week. Weight at home 250-255 pounds. Taking all medications. Using CPAP nightly.   Past Medical History  Diagnosis Date  . CAD (coronary artery disease)     stent placed 06/02/2000  . Lumbar stenosis   . Diabetes mellitus   . Hyperlipidemia   . Congestive heart failure   . COPD (chronic obstructive pulmonary disease) 08/11/2012  . Chronic respiratory failure with hypoxia 08/11/2012  . Peripheral vascular disease   . OSA (obstructive sleep apnea) 08/11/2012    cpcap  . Hypertension   . Shortness of breath     with exertion  . Pneumonia 1999  . Arthritis     Current Outpatient Prescriptions  Medication Sig Dispense Refill  . aspirin 325 MG tablet Take 325 mg by mouth daily.    . B Complex  Vitamins (VITAMIN B COMPLEX PO) Take 1 tablet by mouth 2 (two) times daily.     . bisoprolol (ZEBETA) 5 MG tablet TAKE 1 TABLET by mouth daily 90 tablet 2  . CINNAMON PO Take 2 tablets by mouth daily.    Marland Kitchen doxazosin (CARDURA) 4 MG tablet Take 0.5 tablets (2 mg total) by mouth at bedtime. 45 tablet 3  . doxycycline (ADOXA) 50 MG tablet Take 50 mg by mouth daily.    . fish oil-omega-3 fatty acids 1000 MG capsule Take 3 g by mouth daily.     . furosemide (LASIX) 20 MG tablet Take 20 mg by mouth daily.     Marland Kitchen GARLIC PO Take 1 tablet by mouth daily.    Marland Kitchen glipiZIDE (GLUCOTROL XL) 5 MG 24 hr tablet Take 5 mg by mouth daily.    . isosorbide mononitrate (IMDUR) 30 MG 24 hr tablet TAKE 1 TABLET BY MOUTH DAILY 90 tablet 2  . L-Lysine 1000 MG TABS Take 1,000 mg by mouth daily.     Marland Kitchen losartan (COZAAR) 100 MG tablet Take 100 mg by mouth daily.    . metFORMIN (GLUCOPHAGE) 1000 MG tablet Take 1,000 mg by mouth 2 (two) times daily with a meal.    . MISC NATURAL PRODUCTS PO Take 500 mg by mouth daily. TUMERIC capsules    . Multiple  Vitamin (MULTIVITAMIN) tablet Take 1 tablet by mouth daily.    . pentoxifylline (TRENTAL) 400 MG CR tablet Take 1 tablet (400 mg total) by mouth 2 (two) times daily. 180 tablet 4  . potassium chloride SA (K-DUR,KLOR-CON) 20 MEQ tablet Take 20 mEq by mouth 2 (two) times daily.    Marland Kitchen rOPINIRole (REQUIP) 1 MG tablet Take 1 mg by mouth at bedtime.    . simvastatin (ZOCOR) 40 MG tablet Take 40 mg by mouth every evening.    . tiotropium (SPIRIVA) 18 MCG inhalation capsule Place 1 capsule (18 mcg total) into inhaler and inhale every other day. 90 capsule 3  . oxyCODONE-acetaminophen (PERCOCET/ROXICET) 5-325 MG per tablet Take 1-2 tablets by mouth every 4 (four) hours as needed for moderate pain. (Patient not taking: Reported on 05/06/2015) 30 tablet 0   No current facility-administered medications for this encounter.     No Known Allergies  History   Social History  . Marital Status:  Married    Spouse Name: N/A  . Number of Children: N/A  . Years of Education: N/A   Occupational History  . retired    Social History Main Topics  . Smoking status: Former Smoker -- 2.00 packs/day for 28 years    Types: Cigarettes    Quit date: 08/03/1987  . Smokeless tobacco: Never Used  . Alcohol Use: Yes     Comment: weekly  . Drug Use: No  . Sexual Activity: Not on file   Other Topics Concern  . Not on file   Social History Narrative    Family History  Problem Relation Age of Onset  . Heart disease Father 55  . Cancer Father   . Hyperlipidemia Father   . Hypertension Father   . Heart attack Father   . Cancer Mother   . Deep vein thrombosis Mother     Varicose veins  . Diabetes Mother   . Hyperlipidemia Mother   . Hypertension Mother     PHYSICAL EXAM: Filed Vitals:   05/06/15 0835  BP: 134/72  Pulse: 62  Weight: 260 lb (117.935 kg)  SpO2: 97%    General:  No acute distress. No respiratory difficulty HEENT: normal +rosacea  Neck: supple. JVP 5-6 . Carotids 2+ bilat; no bruits. No lymphadenopathy or thryomegaly appreciated. Cor: PMI nondisplaced. Regular rate & rhythm. No rubs, gallops or murmurs. Lungs: clear with mildly diminished  air movment throughout Abdomen: soft, obese nontender, nondistended. No bruits or masses. Good bowel sounds. Extremities: no cyanosis, clubbing, rash, R and LLE trace edema.  Neuro: alert & oriented x 3, cranial nerves grossly intact. moves all 4 extremities w/o difficulty. Affect pleasant.    ASSESSMENT & PLAN:  1) CAD- in [place] with stent. No caths since that time. Myoview 2014 normal EF and LV wall motion . On Statin, BB, + aspirin.  2. Chronic Diastolic HF- NYHA II. Mild dyspnea on occasion. Check ECHO. Check BMET. Volume status stable. Continue current dose of lasix. I have asked him to weigh and record daily.  2) PAD- S/P Femoral artery endarterectomy in 2015. Followed by [facility]. Per Dr [name]  3) HTN- Stable.  Continue current regimen.  4) OSA on CPAP- Continue nightly.    Check ECHO RTC in 6 months.   Ksean Vale, NP-C  8:46 AM

## 2015-05-06 NOTE — Patient Instructions (Signed)
FOLLOW UP in 6 months.  Your provider requested you have an ECHO in 1 week.   LABS today. (bmet)

## 2015-05-13 ENCOUNTER — Ambulatory Visit (HOSPITAL_COMMUNITY)
Admission: RE | Admit: 2015-05-13 | Discharge: 2015-05-13 | Disposition: A | Discharge status: Home / Self Care | Payer: Medicare Other | Source: Ambulatory Visit | Attending: Adult Health | Admitting: Adult Health

## 2015-05-13 DIAGNOSIS — I10 Essential (primary) hypertension: Secondary | ICD-10-CM | POA: Diagnosis not present

## 2015-05-13 DIAGNOSIS — E119 Type 2 diabetes mellitus without complications: Secondary | ICD-10-CM | POA: Insufficient documentation

## 2015-05-13 DIAGNOSIS — E785 Hyperlipidemia, unspecified: Secondary | ICD-10-CM | POA: Insufficient documentation

## 2015-05-13 DIAGNOSIS — I509 Heart failure, unspecified: Secondary | ICD-10-CM | POA: Diagnosis not present

## 2015-05-13 NOTE — Progress Notes (Signed)
  Echocardiogram 2D Echocardiogram has been performed.  Darlina Sicilian M 05/13/2015, 9:31 AM

## 2015-06-04 ENCOUNTER — Other Ambulatory Visit (HOSPITAL_COMMUNITY): Payer: Self-pay | Admitting: *Deleted

## 2015-06-04 MED ORDER — FUROSEMIDE 20 MG PO TABS: 20.0000 mg | ORAL_TABLET | Freq: Every day | ORAL | Status: DC

## 2015-06-06 ENCOUNTER — Other Ambulatory Visit (HOSPITAL_COMMUNITY): Payer: Self-pay | Admitting: *Deleted

## 2015-06-06 MED ORDER — FUROSEMIDE 20 MG PO TABS: 20.0000 mg | ORAL_TABLET | Freq: Every day | ORAL | Status: DC

## 2015-07-18 ENCOUNTER — Ambulatory Visit: Payer: Medicare Other

## 2015-07-24 ENCOUNTER — Ambulatory Visit (INDEPENDENT_AMBULATORY_CARE_PROVIDER_SITE_OTHER): Payer: Medicare Other | Admitting: Podiatry

## 2015-07-24 ENCOUNTER — Encounter: Payer: Self-pay | Admitting: Podiatry

## 2015-07-24 DIAGNOSIS — M79676 Pain in unspecified toe(s): Secondary | ICD-10-CM | POA: Diagnosis not present

## 2015-07-24 DIAGNOSIS — B351 Tinea unguium: Secondary | ICD-10-CM | POA: Diagnosis not present

## 2015-07-24 DIAGNOSIS — I739 Peripheral vascular disease, unspecified: Secondary | ICD-10-CM

## 2015-07-24 NOTE — Progress Notes (Signed)
Patient ID: Anthony [name], male   DOB: 1946/03/10, 69 y.o.   MRN: [id number] HPI  Complaint:  Visit Type: Patient returns to my office for continued preventative foot care services. Complaint: Patient states" my nails have grown long and thick and become painful to walk and wear shoes"  . This patient  presents for preventative foot care services. No changes to ROS  Podiatric Exam: Vascular: dorsalis pedis and posterior tibial pulses are absent. Capillary return is slow to refill. Temperature gradient is negative.  Sensorium: Normal Semmes Weinstein monofilament test. Normal tactile sensation bilaterally.  Nail Exam: Pt has thick disfigured discolored nails with subungual debris noted bilateral entire nail hallux through fifth toenails Ulcer Exam: There is no evidence of ulcer or pre-ulcerative changes or infection. Orthopedic Exam: Muscle tone and strength are WNL. No limitations in general ROM. No crepitus or effusions noted. Foot type and digits show no abnormalities. Bony prominences are unremarkable. Skin: No Porokeratosis. No infection or ulcers  Diagnosis:  Onychomycosis, Pain in right toe, pain in left toes  Treatment & Plan Procedures and Treatment: Consent by patient was obtained for treatment procedures. The patient understood the discussion of treatment and procedures well. All questions were answered thoroughly reviewed. Debridement of mycotic and hypertrophic toenails, 1 through 5 bilateral and clearing of subungual debris. No ulceration, no infection noted.  Return Visit-Office Procedure: Patient instructed to return to the office for a follow up visit 3 months for continued evaluation and treatment.

## 2015-10-09 ENCOUNTER — Encounter: Payer: Self-pay | Admitting: Family

## 2015-10-13 ENCOUNTER — Encounter: Payer: Self-pay | Admitting: Family

## 2015-10-13 ENCOUNTER — Other Ambulatory Visit: Payer: Self-pay | Admitting: Surgery

## 2015-10-13 ENCOUNTER — Ambulatory Visit (HOSPITAL_COMMUNITY)
Admission: RE | Admit: 2015-10-13 | Discharge: 2015-10-13 | Disposition: A | Discharge status: Home / Self Care | Payer: Medicare Other | Source: Ambulatory Visit | Attending: Family | Admitting: Family

## 2015-10-13 ENCOUNTER — Ambulatory Visit (INDEPENDENT_AMBULATORY_CARE_PROVIDER_SITE_OTHER)
Admission: RE | Admit: 2015-10-13 | Discharge: 2015-10-13 | Disposition: A | Discharge status: Home / Self Care | Payer: Medicare Other | Source: Ambulatory Visit | Attending: Family | Admitting: Family

## 2015-10-13 ENCOUNTER — Ambulatory Visit (INDEPENDENT_AMBULATORY_CARE_PROVIDER_SITE_OTHER): Payer: Medicare Other | Admitting: Family

## 2015-10-13 VITALS — BP 108/58 | HR 60 | Temp 97.7°F | Resp 14 | Ht 69.5 in | Wt 265.0 lb

## 2015-10-13 DIAGNOSIS — Z48812 Encounter for surgical aftercare following surgery on the circulatory system: Secondary | ICD-10-CM

## 2015-10-13 DIAGNOSIS — Z87891 Personal history of nicotine dependence: Secondary | ICD-10-CM | POA: Diagnosis not present

## 2015-10-13 DIAGNOSIS — E119 Type 2 diabetes mellitus without complications: Secondary | ICD-10-CM | POA: Diagnosis not present

## 2015-10-13 DIAGNOSIS — Z9862 Peripheral vascular angioplasty status: Secondary | ICD-10-CM | POA: Diagnosis not present

## 2015-10-13 DIAGNOSIS — I739 Peripheral vascular disease, unspecified: Secondary | ICD-10-CM | POA: Diagnosis not present

## 2015-10-13 DIAGNOSIS — I2510 Atherosclerotic heart disease of native coronary artery without angina pectoris: Secondary | ICD-10-CM | POA: Diagnosis not present

## 2015-10-13 DIAGNOSIS — E785 Hyperlipidemia, unspecified: Secondary | ICD-10-CM | POA: Diagnosis not present

## 2015-10-13 DIAGNOSIS — I10 Essential (primary) hypertension: Secondary | ICD-10-CM | POA: Insufficient documentation

## 2015-10-13 NOTE — Progress Notes (Signed)
[facility] [place] HISTORY AND PHYSICAL -PAD  History of Present Illness Anthony [name] is a 69 y.o. male patient of Dr. [name] who returns today for follow up status post right external iliac, common femoral, superficial femoral endarterectomy with bovine pericardial patch angioplasty on 10/10/2014 for lifestyle limiting claudication which was not responsive to conservative treatment. His postoperative course was uncomplicated. He states that he is walking significantly better. The distance has improved. He no longer gets cramping in his calf. He walks about 5 miles/week as confirmed by his fit bit, less than he had been walking, states he is limited by his dyspnea.  He reports improving chronic lumbar and c-spine issues. He denies non healing wounds.   He denies any history of stroke or TIA. The patient denies New Medical or Surgical History.  Pt Diabetic: Yes, states in good control with an A1C of 6.7. Pt smoker: former smoker, quit in 1989  Pt meds include: Statin :Yes Betablocker: No ASA: Yes Other anticoagulants/antiplatelets: no   Past Medical History  Diagnosis Date  . CAD (coronary artery disease)     stent placed 06/02/2000  . Lumbar stenosis   . Diabetes mellitus   . Hyperlipidemia   . Congestive heart failure   . COPD (chronic obstructive pulmonary disease) 08/11/2012  . Chronic respiratory failure with hypoxia 08/11/2012  . Peripheral vascular disease   . OSA (obstructive sleep apnea) 08/11/2012    cpcap  . Hypertension   . Shortness of breath     with exertion  . Pneumonia 1999  . Arthritis     Social History Social History  Substance Use Topics  . Smoking status: Former Smoker -- 2.00 packs/day for 28 years    Types: Cigarettes    Quit date: 08/03/1987  . Smokeless tobacco: Never Used  . Alcohol Use: Yes     Comment: weekly    Family History Family History  Problem Relation Age of Onset  . Heart disease Father 67  .  Cancer Father   . Hyperlipidemia Father   . Hypertension Father   . Heart attack Father   . Cancer Mother   . Deep vein thrombosis Mother     Varicose veins  . Diabetes Mother   . Hyperlipidemia Mother   . Hypertension Mother     Past Surgical History  Procedure Laterality Date  . Tonsillectomy  1965  . [place]    right foot  . Neuroma surgery  1996    right foot  . Lumbar disc surgery  Dec 1997 & Ampril 2005  . Cervical fusion  Nov 2002  . Hammer toe surgery  June 2007 & August 2008    left foot  . Carpal tunnel release  09/30/2006    right wrist  . Hand arthroplasty  Oct 2010    right thumb  . Heart catherization  03/26/1999  . Angioplasty  06/02/2000    Stent  . Spine surgery  04/21/2004    Lower back disk gurgery- Lumbar stenosis  . Abdominal angiogram  Dec. 4, 2013  . Cardiac catheterization    . Anterior lateral lumbar fusion 4 levels Right 05/28/2014    Procedure: Right L4-5 L3-4 L2-3, L1-2  Anterior lateral lumbar fusion with percutaneaous pedicle screws. Lumbar four/five, three/four, two/three and possible two/one;  Surgeon: Erline Levine, MD;  Location: [facility];  Service: Neurosurgery;  Laterality: Right;  Lumbar One-Five Fusion with Percutaneous Screws  . Lumbar percutaneous pedicle screw  4 level N/A 05/28/2014    Procedure: LUMBAR PERCUTANEOUS PEDICLE SCREW 4 LEVEL;  Surgeon: Erline Levine, MD;  Location: [facility];  Service: Neurosurgery;  Laterality: N/A;  . Eye surgery Bilateral 2014    cataracts  . Colonoscopy w/ biopsies and polypectomy      benign  . Endarterectomy femoral Right 10/10/2014    Procedure: RIGHT FEMORAL ARTERY ENDARTERECTOMY  WITH VASCU GUARD PATCH ANGIOPLASTY;  Surgeon: Serafina Mitchell, MD;  Location: [facility];  Service: Vascular;  Laterality: Right;  . Abdominal aortagram N/A 11/29/2012    Procedure: ABDOMINAL Maxcine Ham;  Surgeon: Serafina Mitchell, MD;  Location: [facility] CATH LAB;  Service: Cardiovascular;  Laterality: N/A;  .  Abdominal aortagram N/A 09/03/2014    Procedure: ABDOMINAL AORTAGRAM;  Surgeon: Serafina Mitchell, MD;  Location: [facility] CATH LAB;  Service: Cardiovascular;  Laterality: N/A;    No Known Allergies  Current Outpatient Prescriptions  Medication Sig Dispense Refill  . aspirin 325 MG tablet Take 325 mg by mouth daily.    . B Complex Vitamins (VITAMIN B COMPLEX PO) Take 1 tablet by mouth 2 (two) times daily.     . bisoprolol (ZEBETA) 5 MG tablet Take 1 tablet (5 mg total) by mouth daily. 90 tablet 2  . CINNAMON PO Take 2 tablets by mouth daily.    Marland Kitchen doxazosin (CARDURA) 4 MG tablet Take 0.5 tablets (2 mg total) by mouth at bedtime. 45 tablet 3  . doxycycline (ADOXA) 50 MG tablet Take 50 mg by mouth daily.    . fish oil-omega-3 fatty acids 1000 MG capsule Take 3 g by mouth daily.     . furosemide (LASIX) 20 MG tablet Take 1 tablet (20 mg total) by mouth daily. 90 tablet 3  . GARLIC PO Take 1 tablet by mouth daily.    Marland Kitchen glipiZIDE (GLUCOTROL XL) 5 MG 24 hr tablet Take 5 mg by mouth daily.    . isosorbide mononitrate (IMDUR) 30 MG 24 hr tablet Take 1 tablet (30 mg total) by mouth daily. 90 tablet 2  . L-Lysine 1000 MG TABS Take 1,000 mg by mouth daily.     Marland Kitchen losartan (COZAAR) 100 MG tablet Take 100 mg by mouth daily.    . metFORMIN (GLUCOPHAGE) 1000 MG tablet Take 1,000 mg by mouth 2 (two) times daily with a meal.    . MISC NATURAL PRODUCTS PO Take 500 mg by mouth daily. TUMERIC capsules    . Multiple Vitamin (MULTIVITAMIN) tablet Take 1 tablet by mouth daily.    Marland Kitchen oxyCODONE-acetaminophen (PERCOCET/ROXICET) 5-325 MG per tablet Take 1-2 tablets by mouth every 4 (four) hours as needed for moderate pain. 30 tablet 0  . pentoxifylline (TRENTAL) 400 MG CR tablet Take 1 tablet (400 mg total) by mouth 2 (two) times daily. 180 tablet 4  . potassium chloride SA (K-DUR,KLOR-CON) 20 MEQ tablet Take 20 mEq by mouth 2 (two) times daily.    Marland Kitchen rOPINIRole (REQUIP) 1 MG tablet Take 1 mg by mouth at bedtime.    .  simvastatin (ZOCOR) 40 MG tablet Take 40 mg by mouth every evening.    . tiotropium (SPIRIVA) 18 MCG inhalation capsule Place 1 capsule (18 mcg total) into inhaler and inhale every other day. 90 capsule 3   No current facility-administered medications for this visit.    ROS: See HPI for pertinent positives and negatives.   Physical Examination  Filed Vitals:   10/13/15 1420  BP: 108/58  Pulse: 60  Temp: 97.7 F (  36.5 C)  TempSrc: Oral  Resp: 14  Height: 5' 9.5" (1.765 m)  Weight: 265 lb (120.203 kg)  SpO2: 95%   Body mass index is 38.59 kg/(m^2).   General: A&O x 3, obese male, WDWN. Gait: normal Eyes: PERRLA. Pulmonary: CTAB, without wheezes , rales or rhonchi. Cardiac: regular Rythm , without detected murmur.     Carotid Bruits Right Left   Negative Negative  Aorta is not palpable. Radial pulses: 2+ palpable and =   VASCULAR EXAM: Extremities without ischemic changes, without Gangrene; without open wounds. Left great toenail and left second toenail surgically absent.     LE Pulses Right Left   FEMORAL  palpable not palpable    POPLITEAL not palpable  not palpable   POSTERIOR TIBIAL not palpable  not palpable    DORSALIS PEDIS  ANTERIOR TIBIAL not palpable  faintly palpable    Abdomen: soft, NT, no palpable masses. Skin: no rashes, no ulcers. Musculoskeletal: no muscle wasting or atrophy. Neurologic: A&O X 3; Appropriate Affect ; SENSATION: normal; MOTOR FUNCTION: moving all extremities equally, motor strength 5/5 throughout. Speech is fluent/normal.  CN 2-12 intact.          Non-Invasive Vascular Imaging: DATE: 10/13/2015 LOWER EXTREMITY ARTERIAL DUPLEX EVALUATION    INDICATION: Peripheral vascular disease     PREVIOUS  INTERVENTION(S): Right external iliac, common femoral, and superficial femoral endarterectomy performed 10/10/2014.    DUPLEX EXAM:     RIGHT  LEFT   Peak Systolic Velocity (cm/s) Ratio (if abnormal) Waveform  Peak Systolic Velocity (cm/s) Ratio (if abnormal) Waveform  202  T Common Femoral Artery     223  B Deep Femoral Artery     149  T Superficial Femoral Artery Proximal     53/262/357/ 163 6.7 B/B/B Superficial Femoral Artery Mid     127  M Superficial Femoral Artery Distal     38/44/183/26 4.2 M/M/M/M Popliteal Artery     56  M Posterior Tibial Artery Dist     41  M Anterior Tibial Artery Distal     29  M Peroneal Artery Distal     0.56/0.92 Today's ABI / TBI 0.69/0.65  0.76/0.43 Previous ABI / TBI (04/07/2015 ) 0.87/0.69    Waveform:    M - Monophasic       B - Biphasic       T - Triphasic  If Ankle Brachial Index (ABI) or Toe Brachial Index (TBI) performed, please see complete report     ADDITIONAL FINDINGS:     IMPRESSION: Abnormal elevated velocities present involving the mid superficial femoral artery and mid popliteal artery suggestive of 50%-74%, and by ratio criteria may be as high as 75%-99% stenosis, calcific plaque present which may underestimate disease present as well as multifocal stenosis.    Compared to the previous exam:  Decreased ankle brachial indices since study on 04/07/2015.     ASSESSMENT: Anthony [name] is a 69 y.o. male who is status post right external iliac, common femoral, superficial femoral endarterectomy with bovine pericardial patch angioplasty on 10/10/2014 for lifestyle limiting claudication which was not responsive to conservative treatment. He has no claudication since the above procedure. He has no signs of ischemia in his lower extremities.  Today's right LE arterial duplex suggests abnormal elevated velocities involving the mid superficial femoral artery and mid popliteal artery suggestive of 50%-74%, and by ratio criteria may be as  high as 75%-99% stenosis, calcific plaque present which may underestimate disease present as  well as multifocal stenosis. Velocities are similar to duplex performed on 04/07/15. However, ankle brachial indices have worsened since study on 04/07/2015. Dr. [name] spoke with pt.    PLAN:  Graduated walking program discussed.  Based on the patient's vascular studies and examination, and after discussing with Dr. [name], pt will return to clinic in 6 months with ABI's and right LE arterial duplex.  I discussed in depth with the patient the nature of atherosclerosis, and emphasized the importance of maximal medical management including strict control of blood pressure, blood glucose, and lipid levels, obtaining regular exercise, and continued cessation of smoking.  The patient is aware that without maximal medical management the underlying atherosclerotic disease process will progress, limiting the benefit of any interventions.  The patient was given information about PAD including signs, symptoms, treatment, what symptoms should prompt the patient to seek immediate medical care, and risk reduction measures to take.  Clemon Chambers, RN, MSN, FNP-C [facility] Phone: [phone]  Clinic MD: [name]  10/13/2015 1:22 PM

## 2015-10-13 NOTE — Patient Instructions (Signed)
Peripheral Vascular Disease Peripheral vascular disease (PVD) is a disease of the blood vessels that are not part of your heart and brain. A simple term for PVD is poor circulation. In most cases, PVD narrows the blood vessels that carry blood from your heart to the rest of your body. This can result in a decreased supply of blood to your arms, legs, and internal organs, like your stomach or kidneys. However, it most often affects a person's lower legs and feet. There are two types of PVD.  Organic PVD. This is the more common type. It is caused by damage to the structure of blood vessels.  Functional PVD. This is caused by conditions that make blood vessels contract and tighten (spasm). Without treatment, PVD tends to get worse over time. PVD can also lead to acute ischemic limb. This is when an arm or limb suddenly has trouble getting enough blood. This is a medical emergency. CAUSES Each type of PVD has many different causes. The most common cause of PVD is buildup of a fatty material (plaque) inside of your arteries (atherosclerosis). Small amounts of plaque can break off from the walls of the blood vessels and become lodged in a smaller artery. This blocks blood flow and can cause acute ischemic limb. Other common causes of PVD include:  Blood clots that form inside of blood vessels.  Injuries to blood vessels.  Diseases that cause inflammation of blood vessels or cause blood vessel spasms.  Health behaviors and health history that increase your risk of developing PVD. RISK FACTORS  You may have a greater risk of PVD if you:  Have a family history of PVD.  Have certain medical conditions, including:  High cholesterol.  Diabetes.  High blood pressure (hypertension).  Coronary heart disease.  Past problems with blood clots.  Past injury, such as burns or a broken bone. These may have damaged blood vessels in your limbs.  Buerger disease. This is caused by inflamed blood  vessels in your hands and feet.  Some forms of arthritis.  Rare birth defects that affect the arteries in your legs.  Use tobacco.  Do not get enough exercise.  Are obese.  Are age 50 or older. SIGNS AND SYMPTOMS  PVD may cause many different symptoms. Your symptoms depend on what part of your body is not getting enough blood. Some common signs and symptoms include:  Cramps in your lower legs. This may be a symptom of poor leg circulation (claudication).  Pain and weakness in your legs while you are physically active that goes away when you rest (intermittent claudication).  Leg pain when at rest.  Leg numbness, tingling, or weakness.  Coldness in a leg or foot, especially when compared with the other leg.  Skin or hair changes. These can include:  Hair loss.  Shiny skin.  Pale or bluish skin.  Thick toenails.  Inability to get or maintain an erection (erectile dysfunction). People with PVD are more prone to developing ulcers and sores on their toes, feet, or legs. These may take longer than normal to heal. DIAGNOSIS Your health care provider may diagnose PVD from your signs and symptoms. The health care provider will also do a physical exam. You may have tests to find out what is causing your PVD and determine its severity. Tests may include:  Blood pressure recordings from your arms and legs and measurements of the strength of your pulses (pulse volume recordings).  Imaging studies using sound waves to take pictures of   the blood flow through your blood vessels (Doppler ultrasound).  Injecting a dye into your blood vessels before having imaging studies using:  X-rays (angiogram or arteriogram).  Computer-generated X-rays (CT angiogram).  A powerful electromagnetic field and a computer (magnetic resonance angiogram or MRA). TREATMENT Treatment for PVD depends on the cause of your condition and the severity of your symptoms. It also depends on your age. Underlying  causes need to be treated and controlled. These include long-lasting (chronic) conditions, such as diabetes, high cholesterol, and high blood pressure. You may need to first try making lifestyle changes and taking medicines. Surgery may be needed if these do not work. Lifestyle changes may include:  Quitting smoking.  Exercising regularly.  Following a low-fat, low-cholesterol diet. Medicines may include:  Blood thinners to prevent blood clots.  Medicines to improve blood flow.  Medicines to improve your blood cholesterol levels. Surgical procedures may include:  A procedure that uses an inflated balloon to open a blocked artery and improve blood flow (angioplasty).  A procedure to put in a tube (stent) to keep a blocked artery open (stent implant).  Surgery to reroute blood flow around a blocked artery (peripheral bypass surgery).  Surgery to remove dead tissue from an infected wound on the affected limb.  Amputation. This is surgical removal of the affected limb. This may be necessary in cases of acute ischemic limb that are not improved through medical or surgical treatments. HOME CARE INSTRUCTIONS  Take medicines only as directed by your health care provider.  Do not use any tobacco products, including cigarettes, chewing tobacco, or electronic cigarettes. If you need help quitting, ask your health care provider.  Lose weight if you are overweight, and maintain a healthy weight as directed by your health care provider.  Eat a diet that is low in fat and cholesterol. If you need help, ask your health care provider.  Exercise regularly. Ask your health care provider to suggest some good activities for you.  Use compression stockings or other mechanical devices as directed by your health care provider.  Take good care of your feet.  Wear comfortable shoes that fit well.  Check your feet often for any cuts or sores. SEEK MEDICAL CARE IF:  You have cramps in your legs  while walking.  You have leg pain when you are at rest.  You have coldness in a leg or foot.  Your skin changes.  You have erectile dysfunction.  You have cuts or sores on your feet that are not healing. SEEK IMMEDIATE MEDICAL CARE IF:  Your arm or leg turns cold and blue.  Your arms or legs become red, warm, swollen, painful, or numb.  You have chest pain or trouble breathing.  You suddenly have weakness in your face, arm, or leg.  You become very confused or lose the ability to speak.  You suddenly have a very bad headache or lose your vision.   This information is not intended to replace advice given to you by your health care provider. Make sure you discuss any questions you have with your health care provider.   Document Released: 01/20/2005 Document Revised: 01/03/2015 Document Reviewed: 05/23/2014 Elsevier Interactive Patient Education 2016 [employer]  

## 2015-10-17 ENCOUNTER — Telehealth (HOSPITAL_COMMUNITY): Payer: Self-pay

## 2015-10-17 NOTE — Telephone Encounter (Signed)
Patient called concerned that he only got #45 pills in his [facility] prescription and he takes 4 mg daily.  Looking back through 2015 his prescription has been to take 1/2 of a 4 mg tablet.  He says he does run out of them early but always has refills.  Patient is going to look back through his medication prescriptions and check what he has been receiving and call back

## 2015-10-21 ENCOUNTER — Ambulatory Visit (INDEPENDENT_AMBULATORY_CARE_PROVIDER_SITE_OTHER): Payer: Medicare Other | Admitting: Podiatry

## 2015-10-21 DIAGNOSIS — M79676 Pain in unspecified toe(s): Secondary | ICD-10-CM

## 2015-10-21 DIAGNOSIS — B351 Tinea unguium: Secondary | ICD-10-CM

## 2015-10-21 DIAGNOSIS — I739 Peripheral vascular disease, unspecified: Secondary | ICD-10-CM

## 2015-10-21 NOTE — Progress Notes (Signed)
Patient ID: Anthony [name], male   DOB: 10-26-1946, 69 y.o.   MRN: [id number] HPI  Complaint:  Visit Type: Patient returns to my office for continued preventative foot care services. Complaint: Patient states" my nails have grown long and thick and become painful to walk and wear shoes"  . This patient  presents for preventative foot care services. No changes to ROS  Podiatric Exam: Vascular: dorsalis pedis and posterior tibial pulses are absent. Capillary return is slow to refill. Temperature gradient is negative.  Sensorium: Normal Semmes Weinstein monofilament test. Normal tactile sensation bilaterally.  Nail Exam: Pt has thick disfigured discolored nails with subungual debris noted bilateral entire nail hallux through fifth toenails Ulcer Exam: There is no evidence of ulcer or pre-ulcerative changes or infection. Orthopedic Exam: Muscle tone and strength are WNL. No limitations in general ROM. No crepitus or effusions noted. Foot type and digits show no abnormalities. Bony prominences are unremarkable. Skin: No Porokeratosis. No infection or ulcers  Diagnosis:  Onychomycosis, Pain in right toe, pain in left toes  Treatment & Plan Procedures and Treatment: Consent by patient was obtained for treatment procedures. The patient understood the discussion of treatment and procedures well. All questions were answered thoroughly reviewed. Debridement of mycotic and hypertrophic toenails, 1 through 5 bilateral and clearing of subungual debris. No ulceration, no infection noted.  Return Visit-Office Procedure: Patient instructed to return to the office for a follow up visit 3 months for continued evaluation and treatment.

## 2015-11-18 ENCOUNTER — Ambulatory Visit (HOSPITAL_COMMUNITY)
Admission: RE | Admit: 2015-11-18 | Discharge: 2015-11-18 | Disposition: A | Discharge status: Home / Self Care | Payer: Medicare Other | Source: Ambulatory Visit | Attending: Internal Medicine | Admitting: Internal Medicine

## 2015-11-18 VITALS — BP 112/54 | HR 55 | Wt 260.5 lb

## 2015-11-18 DIAGNOSIS — I739 Peripheral vascular disease, unspecified: Secondary | ICD-10-CM

## 2015-11-18 DIAGNOSIS — E785 Hyperlipidemia, unspecified: Secondary | ICD-10-CM | POA: Diagnosis not present

## 2015-11-18 DIAGNOSIS — I110 Hypertensive heart disease with heart failure: Secondary | ICD-10-CM | POA: Diagnosis not present

## 2015-11-18 DIAGNOSIS — I5032 Chronic diastolic (congestive) heart failure: Secondary | ICD-10-CM | POA: Insufficient documentation

## 2015-11-18 DIAGNOSIS — G4733 Obstructive sleep apnea (adult) (pediatric): Secondary | ICD-10-CM | POA: Insufficient documentation

## 2015-11-18 DIAGNOSIS — I2510 Atherosclerotic heart disease of native coronary artery without angina pectoris: Secondary | ICD-10-CM | POA: Insufficient documentation

## 2015-11-18 NOTE — Progress Notes (Addendum)
CARDIOLOGY CLINIC NOTE  Patient ID: Anthony [name], male   DOB: 12-16-1946, 69 y.o.   MRN: [id number]  PCP: Anthony Baton, MD  HPI: Anthony [name] is a 69 y/o male with h/o obesity, HTN, HL, severe osteoarthritis, DM2 (diagnosed 2009), OSA (intolerant of CPAP), former smoker (quit 1989), CAD s/p stent 2001 and PAD. He is the father-in-law of Dr. Markus [name] in Interventional Radiology.    Had cath in [place] in 2001. Was told he had 70% blockage in one artery and the one in the back was totally blocked. Eventually underwent stenting of that arter. No caths since. Myoview 10/14: Normal. LV Ejection Fraction: 56%. LV Wall Motion: NL LV Function; NL Wall Motion  Has severe PAD R>L followed by Dr. Trula [name]. Now status post right external iliac, common femoral, superficial femoral endarterectomy with bovine pericardial patch angioplasty on 10/10/2014  PFTs with moderate COPD FEV1 1.83 (62%) FVC   2.84 (66%) FEF 25-75% 0.79 (29%) DLCO 67%  Saw Dr. Halford [name] and placed on home O2 but now weaned off with inhalers. Checks sats with pulse oximeter  ECHO 10/13 EF 50-55%  CXR with elevated R hemidiaphragm. Sleep study with severe OS - AHI 39. Weight initially 285 pounds lost down to 235. Now back at 260  He returns for yearly follow up. Overall feeling pretty good. Claudication much improved after intervention. Now breathing main limiting factor again. Gets SOB with daily walking. No CP/pressure. No edema/PND/Orthopnea.  Walking 3.5K steps per day. Taking all medications. Using CPAP nightly.   Past Medical History  Diagnosis Date  . CAD (coronary artery disease)     stent placed 06/02/2000  . Lumbar stenosis   . Diabetes mellitus ([place])   . Hyperlipidemia   . Congestive heart failure ([place])   . COPD (chronic obstructive pulmonary disease) ([place]) 08/11/2012  . Chronic respiratory failure with hypoxia ([place]) 08/11/2012  . Peripheral vascular disease ([place])   . OSA (obstructive sleep apnea) 08/11/2012    cpcap  .  Hypertension   . Shortness of breath     with exertion  . Pneumonia 1999  . Arthritis     Current Outpatient Prescriptions  Medication Sig Dispense Refill  . albuterol (PROAIR HFA) 108 (90 BASE) MCG/ACT inhaler Inhale 2 puffs into the lungs every 6 (six) hours as needed for wheezing or shortness of breath.    Marland Kitchen aspirin 325 MG tablet Take 325 mg by mouth daily.    . B Complex Vitamins (VITAMIN B COMPLEX PO) Take 1 tablet by mouth 2 (two) times daily.     . B Complex-Folic Acid (BENFOTIAMINE MULTI-B PO) Take 2 tablets by mouth daily.    . bisoprolol (ZEBETA) 5 MG tablet Take 1 tablet (5 mg total) by mouth daily. 90 tablet 2  . CINNAMON PO Take 2 tablets by mouth daily.    Marland Kitchen doxazosin (CARDURA) 4 MG tablet Take 0.5 tablets (2 mg total) by mouth at bedtime. 45 tablet 3  . doxycycline (ADOXA) 50 MG tablet Take 50 mg by mouth daily.    . fish oil-omega-3 fatty acids 1000 MG capsule Take 3 g by mouth daily.     . fluticasone (FLONASE) 50 MCG/ACT nasal spray Place 1 spray into both nostrils daily as needed for allergies or rhinitis.    . furosemide (LASIX) 20 MG tablet Take 40 mg by mouth daily.    Marland Kitchen GARLIC PO Take 1 tablet by mouth daily.    Marland Kitchen glipiZIDE (GLUCOTROL) 10 MG tablet Take 10 mg  by mouth daily before breakfast.    . isosorbide mononitrate (IMDUR) 30 MG 24 hr tablet Take 1 tablet (30 mg total) by mouth daily. 90 tablet 2  . L-Lysine 1000 MG TABS Take 1,000 mg by mouth daily.     Marland Kitchen losartan (COZAAR) 100 MG tablet Take 100 mg by mouth daily.    . metFORMIN (GLUCOPHAGE) 1000 MG tablet Take 1,000 mg by mouth 2 (two) times daily with a meal.    . Multiple Vitamin (MULTIVITAMIN) tablet Take 1 tablet by mouth daily.    . pentoxifylline (TRENTAL) 400 MG CR tablet Take 1 tablet (400 mg total) by mouth 2 (two) times daily. 180 tablet 4  . potassium chloride SA (K-DUR,KLOR-CON) 20 MEQ tablet Take 20 mEq by mouth 2 (two) times daily.    Marland Kitchen rOPINIRole (REQUIP) 1 MG tablet Take 1 mg by mouth at  bedtime.    . simvastatin (ZOCOR) 40 MG tablet Take 40 mg by mouth every evening.    . tiotropium (SPIRIVA) 18 MCG inhalation capsule Place 1 capsule (18 mcg total) into inhaler and inhale every other day. 90 capsule 3   No current facility-administered medications for this encounter.     No Known Allergies  Social History   Social History  . Marital Status: Married    Spouse Name: N/A  . Number of Children: N/A  . Years of Education: N/A   Occupational History  . retired    Social History Main Topics  . Smoking status: Former Smoker -- 2.00 packs/day for 28 years    Types: Cigarettes    Quit date: 08/03/1987  . Smokeless tobacco: Never Used  . Alcohol Use: Yes     Comment: weekly  . Drug Use: No  . Sexual Activity: Not on file   Other Topics Concern  . Not on file   Social History Narrative    Family History  Problem Relation Age of Onset  . Heart disease Father 88  . Cancer Father   . Hyperlipidemia Father   . Hypertension Father   . Heart attack Father   . Cancer Mother   . Deep vein thrombosis Mother     Varicose veins  . Diabetes Mother   . Hyperlipidemia Mother   . Hypertension Mother     PHYSICAL EXAM: Filed Vitals:   11/18/15 1105  BP: 112/54  Pulse: 55  Weight: 260 lb 8 oz (118.162 kg)  SpO2: 94%    General:  No acute distress. No respiratory difficulty HEENT: normal +rosacea  Neck: supple. JVP 5-6 . Carotids 2+ bilat; no bruits. No lymphadenopathy or thryomegaly appreciated. Cor: PMI nondisplaced. Regular rate & rhythm. No rubs, gallops or murmurs. Lungs: clear with mildly diminished  air movment throughout Abdomen: soft, obese nontender, nondistended. No bruits or masses. Good bowel sounds. Extremities: no cyanosis, clubbing, rash, R and LLE trace edema.  Neuro: alert & oriented x 3, cranial nerves grossly intact. moves all 4 extremities w/o difficulty. Affect pleasant.   ASSESSMENT & PLAN:  1) CAD- Doing well with no evidence of  angina. Last cath in [place] with stent. Myoview 2014 normal EF and LV wall motion . On Statin, BB, + aspirin. Low threshold to repeat cath if any ischemic symptoms. 2. Chronic Diastolic HF- Volume status ok. NYHA III. Check ECHO. Stressed need for routine exercise program and asked him to get trainer or join gym for organized exercise program. 2) PAD- Claudication improved s/p emoral artery endarterectomy in 2015. Followed by [facility].  Per Dr Anthony [name] 3) HTN- Stable. Continue current regimen.  4) OSA on CPAP- Continue nightly.  5. Hyperlipidemia - Followed by Dr. Virgina Jock.   RTC in 6 months.   Glori Bickers, MD  11:40 AM

## 2015-11-18 NOTE — Patient Instructions (Signed)
Exercise regularly Follow up in 6 months

## 2015-11-21 NOTE — Addendum Note (Signed)
Encounter addended by: Jolaine Artist, MD on: 11/21/2015  7:34 PM<BR>     Documentation filed: Notes Section

## 2015-12-28 HISTORY — PX: COLONOSCOPY: SHX174

## 2016-01-12 ENCOUNTER — Other Ambulatory Visit: Payer: Self-pay | Admitting: Pulmonary Disease

## 2016-01-13 ENCOUNTER — Encounter: Payer: Self-pay | Admitting: Pulmonary Disease

## 2016-01-13 ENCOUNTER — Ambulatory Visit (INDEPENDENT_AMBULATORY_CARE_PROVIDER_SITE_OTHER): Payer: Medicare Other | Admitting: Pulmonary Disease

## 2016-01-13 VITALS — BP 112/70 | HR 61 | Ht 69.5 in | Wt 260.6 lb

## 2016-01-13 DIAGNOSIS — J986 Disorders of diaphragm: Secondary | ICD-10-CM

## 2016-01-13 DIAGNOSIS — J42 Unspecified chronic bronchitis: Secondary | ICD-10-CM

## 2016-01-13 DIAGNOSIS — G4733 Obstructive sleep apnea (adult) (pediatric): Secondary | ICD-10-CM | POA: Diagnosis not present

## 2016-01-13 MED ORDER — ALBUTEROL SULFATE HFA 108 (90 BASE) MCG/ACT IN AERS: 2.0000 | INHALATION_SPRAY | Freq: Four times a day (QID) | RESPIRATORY_TRACT | Status: DC | PRN

## 2016-01-13 MED ORDER — TIOTROPIUM BROMIDE MONOHYDRATE 18 MCG IN CAPS: 18.0000 ug | ORAL_CAPSULE | Freq: Every day | RESPIRATORY_TRACT | Status: DC

## 2016-01-13 NOTE — Progress Notes (Signed)
Current Outpatient Prescriptions on File Prior to Visit  Medication Sig  . aspirin 325 MG tablet Take 325 mg by mouth daily.  . B Complex Vitamins (VITAMIN B COMPLEX PO) Take 1 tablet by mouth 2 (two) times daily.   . B Complex-Folic Acid (BENFOTIAMINE MULTI-B PO) Take 2 tablets by mouth daily.  . bisoprolol (ZEBETA) 5 MG tablet Take 1 tablet (5 mg total) by mouth daily.  Marland Kitchen CINNAMON PO Take 2 tablets by mouth daily.  Marland Kitchen doxycycline (ADOXA) 50 MG tablet Take 50 mg by mouth daily.  . fish oil-omega-3 fatty acids 1000 MG capsule Take 3 g by mouth daily.   . furosemide (LASIX) 20 MG tablet Take 40 mg by mouth daily.  Marland Kitchen GARLIC PO Take 1 tablet by mouth daily.  Marland Kitchen glipiZIDE (GLUCOTROL) 10 MG tablet Take 10 mg by mouth daily before breakfast.  . isosorbide mononitrate (IMDUR) 30 MG 24 hr tablet Take 1 tablet (30 mg total) by mouth daily.  Marland Kitchen L-Lysine 1000 MG TABS Take 1,000 mg by mouth daily.   Marland Kitchen losartan (COZAAR) 100 MG tablet Take 100 mg by mouth daily.  . metFORMIN (GLUCOPHAGE) 1000 MG tablet Take 1,000 mg by mouth 2 (two) times daily with a meal.  . Multiple Vitamin (MULTIVITAMIN) tablet Take 1 tablet by mouth daily.  . pentoxifylline (TRENTAL) 400 MG CR tablet Take 1 tablet (400 mg total) by mouth 2 (two) times daily.  . potassium chloride SA (K-DUR,KLOR-CON) 20 MEQ tablet Take 20 mEq by mouth 2 (two) times daily.  Marland Kitchen rOPINIRole (REQUIP) 1 MG tablet Take 1 mg by mouth at bedtime.  . simvastatin (ZOCOR) 40 MG tablet Take 40 mg by mouth every evening.   No current facility-administered medications on file prior to visit.     Chief Complaint  Patient presents with  . Follow-up    Wears CPAP machine nightly. Denies any problems with mask/pressure. DME: [facility]. Needs refills. Discuss Spiriva dosing.      Tests PFT 08/13/12>>FEV1 1.83 (62%), FEV1% 64, TLC 5.17 (82%), DLCO 67%, +BD SNIFF test 08/14/12>>Paradoxical motion of the right hemidiaphragm with inspiration.  PSG 08/14/12>>AHI 38.5. CPAP  13 cm H2O with 1 liter oxygen. Echo 10/09/12>>mild LVH, EF 50 to [id number], grade 1 diastolic dysfx, mild LA dilation, PAS 35 mmHg ONO with CPAP and RA 01/18/13 >> test time 10 hrs 37 min. Mean SpO2 94%, low SpO2 86%. Spent 16 sec with SpO2 < 88%. Echo 05/13/15 >> EF 45 to [id number], grade 2 diastolic dysfx, mod LVH, mod RV systolic dysfx CPAP [id number] to 12/27/15 >> used on 30 of 30 nights with average 8 hrs and 58 min.  Average AHI is 1 with CPAP 13 cm H2O.   Past medical hx CAD, HLD, combined CHF, PAD, HTN, DM, Lumbar stenosis  Past surgical hx, Allergies, Family hx, Social hx all reviewed.  Vital Signs BP 112/70 mmHg  Pulse 61  Ht 5' 9.5" (1.765 m)  Wt 260 lb 9.6 oz (118.207 kg)  BMI 37.94 kg/m2  SpO2 97%  History of Present Illness Anthony [name] is a 70 y.o. male with COPD, OSA, and rt diaphragm elevation.  He has been doing well.  He is not having cough, wheeze, or chest congestion.  He is doing well with CPAP.  No issues with mask fit.  He is going on trip to [place], [place], [place] if February.  He denies chest pain, cough, sputum, hemoptysis, fever, or leg swelling.  Physical Exam  General - No distress  ENT - No sinus tenderness, no oral exudate, no LAN Cardiac - s1s2 regular, no murmur Chest - No wheeze/rales/dullness Back - No focal tenderness Abd - Soft, non-tender Ext - No edema Neuro - Normal strength Skin - No rashes Psych - normal mood, and behavior   Assessment/Plan  COPD with chronic bronchitis. Plan: - continue spiriva and prn proair  OSA. He is compliant with CPAP and reports benefit. Plan: - continue CPAP 13 cm H2O  Rt hemidiaphragm elevation. Plan: - monitor clinically   Patient Instructions  Follow up in 1 year     [name], MD [place] Pager:  [phone]

## 2016-01-13 NOTE — Patient Instructions (Signed)
Follow up in 1 year.

## 2016-01-27 ENCOUNTER — Ambulatory Visit (INDEPENDENT_AMBULATORY_CARE_PROVIDER_SITE_OTHER): Payer: Medicare Other | Admitting: Podiatry

## 2016-01-27 ENCOUNTER — Encounter: Payer: Self-pay | Admitting: Podiatry

## 2016-01-27 DIAGNOSIS — I739 Peripheral vascular disease, unspecified: Secondary | ICD-10-CM

## 2016-01-27 DIAGNOSIS — M79676 Pain in unspecified toe(s): Secondary | ICD-10-CM

## 2016-01-27 DIAGNOSIS — B351 Tinea unguium: Secondary | ICD-10-CM

## 2016-01-27 NOTE — Progress Notes (Signed)
Patient ID: Anthony [name], male   DOB: Jan 28, 1946, 70 y.o.   MRN: [id number] HPI  Complaint:  Visit Type: Patient returns to my office for continued preventative foot care services. Complaint: Patient states" my nails have grown long and thick and become painful to walk and wear shoes"  . This patient  presents for preventative foot care services. No changes to ROS  Podiatric Exam: Vascular: dorsalis pedis and posterior tibial pulses are absent. Capillary return is slow to refill. Temperature gradient is negative.  Sensorium: Normal Semmes Weinstein monofilament test. Normal tactile sensation bilaterally.  Nail Exam: Pt has thick disfigured discolored nails with subungual debris noted bilateral entire nail hallux through fifth toenails Ulcer Exam: There is no evidence of ulcer or pre-ulcerative changes or infection. Orthopedic Exam: Muscle tone and strength are WNL. No limitations in general ROM. No crepitus or effusions noted. Foot type and digits show no abnormalities. Bony prominences are unremarkable. Skin: No Porokeratosis. No infection or ulcers  Diagnosis:  Onychomycosis, Pain in right toe, pain in left toes  Treatment & Plan Procedures and Treatment: Consent by patient was obtained for treatment procedures. The patient understood the discussion of treatment and procedures well. All questions were answered thoroughly reviewed. Debridement of mycotic and hypertrophic toenails, 1 through 5 bilateral and clearing of subungual debris. No ulceration, no infection noted.  Return Visit-Office Procedure: Patient instructed to return to the office for a follow up visit 3 months for continued evaluation and treatment.   Gardiner Barefoot DPM

## 2016-02-06 ENCOUNTER — Other Ambulatory Visit: Payer: Self-pay | Admitting: Surgery

## 2016-03-16 ENCOUNTER — Encounter: Payer: Self-pay | Admitting: Internal Medicine

## 2016-03-18 ENCOUNTER — Telehealth (HOSPITAL_COMMUNITY): Payer: Self-pay | Admitting: Vascular Surgery

## 2016-03-18 NOTE — Telephone Encounter (Signed)
Left pt message to make f/u appt 

## 2016-03-26 ENCOUNTER — Telehealth: Payer: Self-pay | Admitting: Pulmonary Disease

## 2016-03-26 NOTE — Telephone Encounter (Signed)
Received a form from Dr Halford Chessman from [employer] and [facility] - Per Dr Halford Chessman, okay to send records requested.  Given the amount of records requested to be printed/faxed this is required to go to Medical Records to be processed.  Per letter, states "please submit copies of the following available and applicable medical records from December 28, 2003 through present: Imaging reports Medical records/office notes History and Physical Admission and discharge summaries Radiology Reports Operative Reports Pulmonary Function test results EKC/EEG/EMG reports.   Letter and signed forms placed in inter-office envelope to be sent to [facility]; ATTN: Medical Release Copies made and kept in my possession to follow up on.

## 2016-04-06 ENCOUNTER — Telehealth: Payer: Self-pay | Admitting: Gastroenterology

## 2016-04-06 ENCOUNTER — Encounter (HOSPITAL_COMMUNITY): Payer: Self-pay | Admitting: *Deleted

## 2016-04-06 ENCOUNTER — Ambulatory Visit (HOSPITAL_COMMUNITY)
Admission: RE | Admit: 2016-04-06 | Discharge: 2016-04-06 | Disposition: A | Discharge status: Home / Self Care | Payer: Medicare Other | Source: Ambulatory Visit | Attending: Internal Medicine | Admitting: Internal Medicine

## 2016-04-06 VITALS — BP 142/66 | HR 68 | Wt 260.5 lb

## 2016-04-06 DIAGNOSIS — I5032 Chronic diastolic (congestive) heart failure: Secondary | ICD-10-CM

## 2016-04-06 DIAGNOSIS — I110 Hypertensive heart disease with heart failure: Secondary | ICD-10-CM | POA: Insufficient documentation

## 2016-04-06 DIAGNOSIS — E785 Hyperlipidemia, unspecified: Secondary | ICD-10-CM | POA: Diagnosis not present

## 2016-04-06 DIAGNOSIS — I739 Peripheral vascular disease, unspecified: Secondary | ICD-10-CM | POA: Insufficient documentation

## 2016-04-06 DIAGNOSIS — Z7982 Long term (current) use of aspirin: Secondary | ICD-10-CM | POA: Insufficient documentation

## 2016-04-06 DIAGNOSIS — I2510 Atherosclerotic heart disease of native coronary artery without angina pectoris: Secondary | ICD-10-CM | POA: Diagnosis not present

## 2016-04-06 DIAGNOSIS — I10 Essential (primary) hypertension: Secondary | ICD-10-CM | POA: Diagnosis not present

## 2016-04-06 DIAGNOSIS — Z833 Family history of diabetes mellitus: Secondary | ICD-10-CM | POA: Insufficient documentation

## 2016-04-06 DIAGNOSIS — Z79899 Other long term (current) drug therapy: Secondary | ICD-10-CM | POA: Insufficient documentation

## 2016-04-06 DIAGNOSIS — Z955 Presence of coronary angioplasty implant and graft: Secondary | ICD-10-CM | POA: Diagnosis not present

## 2016-04-06 DIAGNOSIS — Z8249 Family history of ischemic heart disease and other diseases of the circulatory system: Secondary | ICD-10-CM | POA: Diagnosis not present

## 2016-04-06 DIAGNOSIS — Z7984 Long term (current) use of oral hypoglycemic drugs: Secondary | ICD-10-CM | POA: Insufficient documentation

## 2016-04-06 DIAGNOSIS — G4733 Obstructive sleep apnea (adult) (pediatric): Secondary | ICD-10-CM

## 2016-04-06 DIAGNOSIS — E119 Type 2 diabetes mellitus without complications: Secondary | ICD-10-CM | POA: Diagnosis not present

## 2016-04-06 DIAGNOSIS — Z87891 Personal history of nicotine dependence: Secondary | ICD-10-CM | POA: Insufficient documentation

## 2016-04-06 DIAGNOSIS — J449 Chronic obstructive pulmonary disease, unspecified: Secondary | ICD-10-CM | POA: Diagnosis not present

## 2016-04-06 NOTE — Progress Notes (Signed)
38 medical statement completed by Dr [name] and faxed along w/medical records to [employer] and [employer] at [phone], signed auth from pt on file

## 2016-04-06 NOTE — Progress Notes (Signed)
Patient ID: Anthony [name], male   DOB: [birth year], 70 y.o.   MRN: [id number]   CARDIOLOGY CLINIC NOTE  Patient ID: Anthony [name], male   DOB: 1946-12-05, 70 y.o.   MRN: [id number]  PCP: [name], MD  HPI: [name] is a 70 y/o male with h/o obesity, HTN, HL, severe osteoarthritis, DM2 (diagnosed 2009), OSA (intolerant of CPAP), former smoker (quit 1989), CAD s/p stent 2001 and PAD. He is the father-in-law of Dr. [name] in Interventional Radiology.    Had cath in [place] in 2001. Was told he had 70% blockage in one artery and the one in the back was totally blocked. Eventually underwent stenting of that arter. No caths since. Myoview 10/14: Normal. LV Ejection Fraction: 56%. LV Wall Motion: NL LV Function; NL Wall Motion  Has severe PAD R>L followed by Dr. [name]. Now status post right external iliac, common femoral, superficial femoral endarterectomy with bovine pericardial patch angioplasty on 10/10/2014  PFTs with moderate COPD FEV1 1.83 (62%) FVC   2.84 (66%) FEF 25-75% 0.79 (29%) DLCO 67%  Saw Dr. Halford Chessman and placed on home O2 but now weaned off with inhalers. Checks sats with pulse oximeter  ECHO 10/13 EF 50-55%  ECHO 5/16: EF 45-50% mold to moderate RV dysfunction Grade II DD  CXR with elevated R hemidiaphragm. Sleep study with severe OS - AHI 39. Weight initially 285 pounds lost down to 235. Now back at 260  He returns for yearly follow up. Overall feeling pretty good. Has been travelling to [place], [place], [place] and [place]. Walked 20 miles in 1 week. Feeling better.  Claudication improved. Breathing limits him now not claudication. Gets SOB with moderate walking. No CP/pressure. No edema/PND/Orthopnea.   Taking all medications. Using CPAP nightly. SBP 120-140s.Weight stable.   Past Medical History  Diagnosis Date  . CAD (coronary artery disease)     stent placed 06/02/2000  . Lumbar stenosis   . Diabetes mellitus ([place])   . Hyperlipidemia   . Congestive heart failure  ([place])   . COPD (chronic obstructive pulmonary disease) ([place]) 08/11/2012  . Chronic respiratory failure with hypoxia ([place]) 08/11/2012  . Peripheral vascular disease ([place])   . OSA (obstructive sleep apnea) 08/11/2012    cpcap  . Hypertension   . Shortness of breath     with exertion  . Pneumonia 1999  . Arthritis     Current Outpatient Prescriptions  Medication Sig Dispense Refill  . albuterol (PROAIR HFA) 108 (90 Base) MCG/ACT inhaler Inhale 2 puffs into the lungs every 6 (six) hours as needed for wheezing or shortness of breath. 3 Inhaler 3  . aspirin 325 MG tablet Take 325 mg by mouth daily.    . B Complex Vitamins (VITAMIN B COMPLEX PO) Take 1 tablet by mouth 2 (two) times daily.     . B Complex-Folic Acid (BENFOTIAMINE MULTI-B PO) Take 2 tablets by mouth daily.    . bisoprolol (ZEBETA) 5 MG tablet Take 1 tablet (5 mg total) by mouth daily. 90 tablet 2  . CINNAMON PO Take 2 tablets by mouth daily.    Marland Kitchen doxazosin (CARDURA) 2 MG tablet Take 2 mg by mouth daily.    Marland Kitchen doxycycline (ADOXA) 50 MG tablet Take 50 mg by mouth daily.    . fish oil-omega-3 fatty acids 1000 MG capsule Take 3 g by mouth daily.     . furosemide (LASIX) 20 MG tablet Take 40 mg by mouth daily.    Marland Kitchen GARLIC PO  Take 1 tablet by mouth daily.    Marland Kitchen glipiZIDE (GLUCOTROL) 10 MG tablet Take 10 mg by mouth daily before breakfast.    . isosorbide mononitrate (IMDUR) 30 MG 24 hr tablet Take 1 tablet (30 mg total) by mouth daily. 90 tablet 2  . L-Lysine 1000 MG TABS Take 1,000 mg by mouth daily.     Marland Kitchen losartan (COZAAR) 100 MG tablet Take 100 mg by mouth daily.    . metFORMIN (GLUCOPHAGE) 1000 MG tablet Take 1,000 mg by mouth 2 (two) times daily with a meal.    . Multiple Vitamin (MULTIVITAMIN) tablet Take 1 tablet by mouth daily.    . pentoxifylline (TRENTAL) 400 MG CR tablet Take 1 tablet by mouth  twice a day 180 tablet 2  . potassium chloride SA (K-DUR,KLOR-CON) 20 MEQ tablet Take 20 mEq by mouth 2 (two) times daily.    Marland Kitchen  rOPINIRole (REQUIP) 1 MG tablet Take 1 mg by mouth at bedtime.    . simvastatin (ZOCOR) 40 MG tablet Take 40 mg by mouth every evening.    . sitaGLIPtin (JANUVIA) 100 MG tablet Take 100 mg by mouth daily.    Marland Kitchen tiotropium (SPIRIVA HANDIHALER) 18 MCG inhalation capsule Place 1 capsule (18 mcg total) into inhaler and inhale daily. 90 capsule 3   No current facility-administered medications for this encounter.     No Known Allergies  Social History   Social History  . Marital Status: Married    Spouse Name: N/A  . Number of Children: N/A  . Years of Education: N/A   Occupational History  . retired    Social History Main Topics  . Smoking status: Former Smoker -- 2.00 packs/day for 28 years    Types: Cigarettes    Quit date: 08/03/1987  . Smokeless tobacco: Never Used  . Alcohol Use: Yes     Comment: weekly  . Drug Use: No  . Sexual Activity: Not on file   Other Topics Concern  . Not on file   Social History Narrative    Family History  Problem Relation Age of Onset  . Heart disease Father 11  . Cancer Father   . Hyperlipidemia Father   . Hypertension Father   . Heart attack Father   . Cancer Mother   . Deep vein thrombosis Mother     Varicose veins  . Diabetes Mother   . Hyperlipidemia Mother   . Hypertension Mother     PHYSICAL EXAM: Filed Vitals:   04/06/16 1033  BP: 142/66  Pulse: 68  Weight: 260 lb 8 oz (118.162 kg)  SpO2: 94%    General:  No acute distress. No respiratory difficulty HEENT: normal +rosacea  Neck: supple. JVP 5-6 . Carotids 2+ bilat; no bruits. No lymphadenopathy or thryomegaly appreciated. Cor: PMI nondisplaced. Regular rate & rhythm. No rubs, gallops or murmurs. Lungs: clear with mildly diminished  air movment throughout Abdomen: soft, obese nontender, nondistended. No bruits or masses. Good bowel sounds. Extremities: no cyanosis, clubbing, rash, R and LLE no edema.  Neuro: alert & oriented x 3, cranial nerves grossly intact.  moves all 4 extremities w/o difficulty. Affect pleasant.   ASSESSMENT & PLAN:  1) CAD- Doing well with no evidence of angina. Last cath in [place] with stent. Myoview 2014 normal EF and LV wall motion . On Statin, BB, + aspirin. Low threshold to repeat cath if any ischemic symptoms. 2) Chronic Diastolic HF- Volume status ok. NYHA III. EF 45-50% with mild RV  dysfunction on ECHO 5/16. Volume status ok  3) PAD- Claudication improved s/p femoral artery endarterectomy in 2015. Followed by [facility]. Per Dr [name] 4) HTN- BP 140s here. But mostly 110-130. Willc ontinue current regimen.  5) OSA on CPAP- Continue nightly.  6)  Hyperlipidemia - Followed by Dr. Virgina Jock.   RTC in 6 months.   Glori Bickers, MD  10:41 AM

## 2016-04-06 NOTE — Telephone Encounter (Signed)
Rec'd from Tyler Aas MD forward 6 pages to [facility]

## 2016-04-06 NOTE — Addendum Note (Signed)
Encounter addended by: Scarlette Calico, RN on: 04/06/2016 11:00 AM<BR>     Documentation filed: Patient Instructions Section

## 2016-04-06 NOTE — Patient Instructions (Signed)
We will contact you in 6 months to schedule your next appointment.  

## 2016-04-07 ENCOUNTER — Encounter: Payer: Self-pay | Admitting: Family

## 2016-04-19 ENCOUNTER — Ambulatory Visit (INDEPENDENT_AMBULATORY_CARE_PROVIDER_SITE_OTHER): Payer: Medicare Other | Admitting: Family

## 2016-04-19 ENCOUNTER — Encounter: Payer: Self-pay | Admitting: Family

## 2016-04-19 ENCOUNTER — Ambulatory Visit (INDEPENDENT_AMBULATORY_CARE_PROVIDER_SITE_OTHER)
Admission: RE | Admit: 2016-04-19 | Discharge: 2016-04-19 | Disposition: A | Discharge status: Home / Self Care | Payer: Medicare Other | Source: Ambulatory Visit | Attending: Family | Admitting: Family

## 2016-04-19 ENCOUNTER — Ambulatory Visit (HOSPITAL_COMMUNITY)
Admission: RE | Admit: 2016-04-19 | Discharge: 2016-04-19 | Disposition: A | Discharge status: Home / Self Care | Payer: Medicare Other | Source: Ambulatory Visit | Attending: Family | Admitting: Family

## 2016-04-19 VITALS — BP 136/72 | HR 58 | Temp 97.5°F | Resp 18 | Ht 69.5 in | Wt 259.0 lb

## 2016-04-19 DIAGNOSIS — I739 Peripheral vascular disease, unspecified: Secondary | ICD-10-CM | POA: Diagnosis not present

## 2016-04-19 DIAGNOSIS — E119 Type 2 diabetes mellitus without complications: Secondary | ICD-10-CM | POA: Diagnosis not present

## 2016-04-19 DIAGNOSIS — G4733 Obstructive sleep apnea (adult) (pediatric): Secondary | ICD-10-CM | POA: Insufficient documentation

## 2016-04-19 DIAGNOSIS — I70201 Unspecified atherosclerosis of native arteries of extremities, right leg: Secondary | ICD-10-CM | POA: Diagnosis not present

## 2016-04-19 DIAGNOSIS — Z87891 Personal history of nicotine dependence: Secondary | ICD-10-CM

## 2016-04-19 DIAGNOSIS — Z9862 Peripheral vascular angioplasty status: Secondary | ICD-10-CM

## 2016-04-19 DIAGNOSIS — I509 Heart failure, unspecified: Secondary | ICD-10-CM | POA: Diagnosis not present

## 2016-04-19 DIAGNOSIS — I10 Essential (primary) hypertension: Secondary | ICD-10-CM | POA: Diagnosis not present

## 2016-04-19 DIAGNOSIS — R0989 Other specified symptoms and signs involving the circulatory and respiratory systems: Secondary | ICD-10-CM | POA: Diagnosis present

## 2016-04-19 DIAGNOSIS — Z48812 Encounter for surgical aftercare following surgery on the circulatory system: Secondary | ICD-10-CM

## 2016-04-19 DIAGNOSIS — E785 Hyperlipidemia, unspecified: Secondary | ICD-10-CM | POA: Diagnosis not present

## 2016-04-19 NOTE — Telephone Encounter (Signed)
Called and spoke with Medical Records Elmyra Ricks), nothing was received.  This has been refaxed to [phone] ATTN: Elmyra Ricks (per her request to handle) Call back to follow up is [phone]

## 2016-04-19 NOTE — Patient Instructions (Signed)
Peripheral Vascular Disease Peripheral vascular disease (PVD) is a disease of the blood vessels that are not part of your heart and brain. A simple term for PVD is poor circulation. In most cases, PVD narrows the blood vessels that carry blood from your heart to the rest of your body. This can result in a decreased supply of blood to your arms, legs, and internal organs, like your stomach or kidneys. However, it most often affects a person's lower legs and feet. There are two types of PVD.  Organic PVD. This is the more common type. It is caused by damage to the structure of blood vessels.  Functional PVD. This is caused by conditions that make blood vessels contract and tighten (spasm). Without treatment, PVD tends to get worse over time. PVD can also lead to acute ischemic limb. This is when an arm or limb suddenly has trouble getting enough blood. This is a medical emergency. CAUSES Each type of PVD has many different causes. The most common cause of PVD is buildup of a fatty material (plaque) inside of your arteries (atherosclerosis). Small amounts of plaque can break off from the walls of the blood vessels and become lodged in a smaller artery. This blocks blood flow and can cause acute ischemic limb. Other common causes of PVD include:  Blood clots that form inside of blood vessels.  Injuries to blood vessels.  Diseases that cause inflammation of blood vessels or cause blood vessel spasms.  Health behaviors and health history that increase your risk of developing PVD. RISK FACTORS  You may have a greater risk of PVD if you:  Have a family history of PVD.  Have certain medical conditions, including:  High cholesterol.  Diabetes.  High blood pressure (hypertension).  Coronary heart disease.  Past problems with blood clots.  Past injury, such as burns or a broken bone. These may have damaged blood vessels in your limbs.  Buerger disease. This is caused by inflamed blood  vessels in your hands and feet.  Some forms of arthritis.  Rare birth defects that affect the arteries in your legs.  Use tobacco.  Do not get enough exercise.  Are obese.  Are age 50 or older. SIGNS AND SYMPTOMS  PVD may cause many different symptoms. Your symptoms depend on what part of your body is not getting enough blood. Some common signs and symptoms include:  Cramps in your lower legs. This may be a symptom of poor leg circulation (claudication).  Pain and weakness in your legs while you are physically active that goes away when you rest (intermittent claudication).  Leg pain when at rest.  Leg numbness, tingling, or weakness.  Coldness in a leg or foot, especially when compared with the other leg.  Skin or hair changes. These can include:  Hair loss.  Shiny skin.  Pale or bluish skin.  Thick toenails.  Inability to get or maintain an erection (erectile dysfunction). People with PVD are more prone to developing ulcers and sores on their toes, feet, or legs. These may take longer than normal to heal. DIAGNOSIS Your health care provider may diagnose PVD from your signs and symptoms. The health care provider will also do a physical exam. You may have tests to find out what is causing your PVD and determine its severity. Tests may include:  Blood pressure recordings from your arms and legs and measurements of the strength of your pulses (pulse volume recordings).  Imaging studies using sound waves to take pictures of   the blood flow through your blood vessels (Doppler ultrasound).  Injecting a dye into your blood vessels before having imaging studies using:  X-rays (angiogram or arteriogram).  Computer-generated X-rays (CT angiogram).  A powerful electromagnetic field and a computer (magnetic resonance angiogram or MRA). TREATMENT Treatment for PVD depends on the cause of your condition and the severity of your symptoms. It also depends on your age. Underlying  causes need to be treated and controlled. These include long-lasting (chronic) conditions, such as diabetes, high cholesterol, and high blood pressure. You may need to first try making lifestyle changes and taking medicines. Surgery may be needed if these do not work. Lifestyle changes may include:  Quitting smoking.  Exercising regularly.  Following a low-fat, low-cholesterol diet. Medicines may include:  Blood thinners to prevent blood clots.  Medicines to improve blood flow.  Medicines to improve your blood cholesterol levels. Surgical procedures may include:  A procedure that uses an inflated balloon to open a blocked artery and improve blood flow (angioplasty).  A procedure to put in a tube (stent) to keep a blocked artery open (stent implant).  Surgery to reroute blood flow around a blocked artery (peripheral bypass surgery).  Surgery to remove dead tissue from an infected wound on the affected limb.  Amputation. This is surgical removal of the affected limb. This may be necessary in cases of acute ischemic limb that are not improved through medical or surgical treatments. HOME CARE INSTRUCTIONS  Take medicines only as directed by your health care provider.  Do not use any tobacco products, including cigarettes, chewing tobacco, or electronic cigarettes. If you need help quitting, ask your health care provider.  Lose weight if you are overweight, and maintain a healthy weight as directed by your health care provider.  Eat a diet that is low in fat and cholesterol. If you need help, ask your health care provider.  Exercise regularly. Ask your health care provider to suggest some good activities for you.  Use compression stockings or other mechanical devices as directed by your health care provider.  Take good care of your feet.  Wear comfortable shoes that fit well.  Check your feet often for any cuts or sores. SEEK MEDICAL CARE IF:  You have cramps in your legs  while walking.  You have leg pain when you are at rest.  You have coldness in a leg or foot.  Your skin changes.  You have erectile dysfunction.  You have cuts or sores on your feet that are not healing. SEEK IMMEDIATE MEDICAL CARE IF:  Your arm or leg turns cold and blue.  Your arms or legs become red, warm, swollen, painful, or numb.  You have chest pain or trouble breathing.  You suddenly have weakness in your face, arm, or leg.  You become very confused or lose the ability to speak.  You suddenly have a very bad headache or lose your vision.   This information is not intended to replace advice given to you by your health care provider. Make sure you discuss any questions you have with your health care provider.   Document Released: 01/20/2005 Document Revised: 01/03/2015 Document Reviewed: 05/23/2014 Elsevier Interactive Patient Education 2016 [employer]  

## 2016-04-19 NOTE — Progress Notes (Signed)
[facility] [place]   CC: Follow up PAD  History of Present Illness Anthony [name] is a 70 y.o. male patient of Dr. [name] who returns today for follow up status post right external iliac, common femoral, superficial femoral endarterectomy with bovine pericardial patch angioplasty on 10/10/2014 for lifestyle limiting claudication which was not responsive to conservative treatment. His postoperative course was uncomplicated. He states that he is walking significantly better. The distance has improved. He no longer gets cramping in his calf.  He walks about 5 miles/week as confirmed by his fit bit, less than he had been walking, states he is limited by his dyspnea.  He reports chronic lumbar and c-spine issues. He denies non healing wounds.   He denies any history of stroke or TIA. The patient denies New Medical or Surgical History.  Pt Diabetic: Yes, states in good control with an A1C of 7.1, increased from 6.7. Pt smoker: former smoker, quit in 1989  Pt meds include: Statin :Yes Betablocker: No ASA: Yes Other anticoagulants/antiplatelets: no   Past Medical History  Diagnosis Date  . CAD (coronary artery disease)     stent placed 06/02/2000  . Lumbar stenosis   . Diabetes mellitus ([place])   . Hyperlipidemia   . Congestive heart failure ([place])   . COPD (chronic obstructive pulmonary disease) ([place]) 08/11/2012  . Chronic respiratory failure with hypoxia ([place]) 08/11/2012  . Peripheral vascular disease ([place])   . OSA (obstructive sleep apnea) 08/11/2012    cpcap  . Hypertension   . Shortness of breath     with exertion  . Pneumonia 1999  . Arthritis     Social History Social History  Substance Use Topics  . Smoking status: Former Smoker -- 2.00 packs/day for 28 years    Types: Cigarettes    Quit date: 08/03/1987  . Smokeless tobacco: Never Used  . Alcohol Use: Yes     Comment: weekly    Family History Family History  Problem Relation Age of  Onset  . Heart disease Father 74  . Cancer Father   . Hyperlipidemia Father   . Hypertension Father   . Heart attack Father   . Cancer Mother   . Deep vein thrombosis Mother     Varicose veins  . Diabetes Mother   . Hyperlipidemia Mother   . Hypertension Mother     Past Surgical History  Procedure Laterality Date  . Tonsillectomy  1965  . [place]    right foot  . Neuroma surgery  1996    right foot  . Lumbar disc surgery  Dec 1997 & Ampril 2005  . Cervical fusion  Nov 2002  . Hammer toe surgery  June 2007 & August 2008    left foot  . Carpal tunnel release  09/30/2006    right wrist  . Hand arthroplasty  Oct 2010    right thumb  . Heart catherization  03/26/1999  . Angioplasty  06/02/2000    Stent  . Spine surgery  04/21/2004    Lower back disk gurgery- Lumbar stenosis  . Abdominal angiogram  Dec. 4, 2013  . Cardiac catheterization    . Anterior lateral lumbar fusion 4 levels Right 05/28/2014    Procedure: Right L4-5 L3-4 L2-3, L1-2  Anterior lateral lumbar fusion with percutaneaous pedicle screws. Lumbar four/five, three/four, two/three and possible two/one;  Surgeon: Erline Levine, MD;  Location: [place] [facility];  Service: Neurosurgery;  Laterality: Right;  Lumbar One-Five  Fusion with Percutaneous Screws  . Lumbar percutaneous pedicle screw 4 level N/A 05/28/2014    Procedure: LUMBAR PERCUTANEOUS PEDICLE SCREW 4 LEVEL;  Surgeon: Erline Levine, MD;  Location: [place] [facility];  Service: Neurosurgery;  Laterality: N/A;  . Eye surgery Bilateral 2014    cataracts  . Colonoscopy w/ biopsies and polypectomy      benign  . Endarterectomy femoral Right 10/10/2014    Procedure: RIGHT FEMORAL ARTERY ENDARTERECTOMY  WITH VASCU GUARD PATCH ANGIOPLASTY;  Surgeon: Serafina Mitchell, MD;  Location: [facility];  Service: Vascular;  Laterality: Right;  . Abdominal aortagram N/A 11/29/2012    Procedure: ABDOMINAL Maxcine Ham;  Surgeon: Serafina Mitchell, MD;  Location: [facility] CATH LAB;  Service:  Cardiovascular;  Laterality: N/A;  . Abdominal aortagram N/A 09/03/2014    Procedure: ABDOMINAL AORTAGRAM;  Surgeon: Serafina Mitchell, MD;  Location: [facility] C[facility] LAB;  Service: Cardiovascular;  Laterality: N/A;    No Known Allergies  Current Outpatient Prescriptions  Medication Sig Dispense Refill  . albuterol (PROAIR HFA) 108 (90 Base) MCG/ACT inhaler Inhale 2 puffs into the lungs every 6 (six) hours as needed for wheezing or shortness of breath. 3 Inhaler 3  . aspirin 325 MG tablet Take 325 mg by mouth daily.    . B Complex Vitamins (VITAMIN B COMPLEX PO) Take 1 tablet by mouth 2 (two) times daily.     . B Complex-Folic Acid (BENFOTIAMINE MULTI-B PO) Take 2 tablets by mouth daily.    . bisoprolol (ZEBETA) 5 MG tablet Take 1 tablet (5 mg total) by mouth daily. 90 tablet 2  . CINNAMON PO Take 2 tablets by mouth daily.    Marland Kitchen doxazosin (CARDURA) 2 MG tablet Take 2 mg by mouth daily.    Marland Kitchen doxycycline (ADOXA) 50 MG tablet Take 50 mg by mouth daily.    . fish oil-omega-3 fatty acids 1000 MG capsule Take 3 g by mouth daily.     . furosemide (LASIX) 20 MG tablet Take 40 mg by mouth daily.    Marland Kitchen GARLIC PO Take 1 tablet by mouth daily.    Marland Kitchen glipiZIDE (GLUCOTROL) 10 MG tablet Take 10 mg by mouth daily before breakfast.    . isosorbide mononitrate (IMDUR) 30 MG 24 hr tablet Take 1 tablet (30 mg total) by mouth daily. 90 tablet 2  . L-Lysine 1000 MG TABS Take 1,000 mg by mouth daily.     Marland Kitchen losartan (COZAAR) 100 MG tablet Take 100 mg by mouth daily.    . metFORMIN (GLUCOPHAGE) 1000 MG tablet Take 1,000 mg by mouth 2 (two) times daily with a meal.    . Multiple Vitamin (MULTIVITAMIN) tablet Take 1 tablet by mouth daily.    . pentoxifylline (TRENTAL) 400 MG CR tablet Take 1 tablet by mouth  twice a day 180 tablet 2  . potassium chloride SA (K-DUR,KLOR-CON) 20 MEQ tablet Take 20 mEq by mouth 2 (two) times daily.    Marland Kitchen rOPINIRole (REQUIP) 1 MG tablet Take 1 mg by mouth at bedtime.    . simvastatin (ZOCOR) 40 MG  tablet Take 40 mg by mouth every evening.    . sitaGLIPtin (JANUVIA) 100 MG tablet Take 100 mg by mouth daily.    Marland Kitchen tiotropium (SPIRIVA HANDIHALER) 18 MCG inhalation capsule Place 1 capsule (18 mcg total) into inhaler and inhale daily. 90 capsule 3   No current facility-administered medications for this visit.    ROS: See HPI for pertinent positives and negatives.   Physical Examination  Filed Vitals:   04/19/16 1314  BP: 136/72  Pulse: 58  Temp: 97.5 F (36.4 C)  TempSrc: Oral  Resp: 18  Height: 5' 9.5" (1.765 m)  Weight: 259 lb (117.482 kg)  SpO2: 98%   Body mass index is 37.71 kg/(m^2).  General: A&O x 3, obese male, WDWN. Gait: normal Eyes: PERRLA. Pulmonary: Respirations are non labored, CTAB, without wheezes , rales or rhonchi. Cardiac: regular rhythm, no detected murmur.     Carotid Bruits Right Left   Negative Negative  Aorta is not palpable. Radial pulses: 2+ palpable and =   VASCULAR EXAM: Extremities without ischemic changes, without Gangrene; without open wounds. Left great toenail and left second toenail surgically absent.     LE Pulses Right Left   FEMORAL  palpable not palpable    POPLITEAL not palpable  not palpable   POSTERIOR TIBIAL not palpable  not palpable    DORSALIS PEDIS  ANTERIOR TIBIAL not palpable  not palpable    Abdomen: soft, NT, no palpable masses. Skin: no rashes, no ulcers. Musculoskeletal: no muscle wasting or atrophy. Neurologic: A&O X 3; Appropriate Affect ; SENSATION: normal; MOTOR FUNCTION: moving all extremities equally, motor strength 5/5 throughout. Speech is fluent/normal.  CN 2-12 intact.                Non-Invasive Vascular Imaging: DATE: 04/19/2016 ABI:  RIGHT: 0.79 (0.56, 10/13/15), Waveforms: monophasic, TBI: 0.66;  LEFT: 0.93 (0.69), Waveforms: triphasic PT, monophasic DP, TBI: 0.68  Right LE Arterial Duplex: Diffuse disease throughout the right SFA with significant stenosis in proximal mid segment. Occluded mid-distal popliteal artery.   ASSESSMENT: Anthony [name] is a 70 y.o. male who is status post right external iliac, common femoral, superficial femoral endarterectomy with bovine pericardial patch angioplasty on 10/10/2014 for lifestyle limiting claudication which was not responsive to conservative treatment. He has no claudication since the above procedure. His walking is limited by dyspnea secondary to his COPD. He has no signs of ischemia in his lower extremities.   Today's right LE arterial duplex suggests diffuse disease throughout the right SFA with significant stenosis in proximal mid segment. Occluded mid-distal popliteal artery.  ABI's seem to have improved significantly since October 2016 indicating moderate arterial occlusive disease on the right with monophasic waveforms, mild arterial occlusive disease on the left with triphasic PT and monophasic DP waveforms.   He walked a great deal on his vacation in January 2017.  His atherosclerotic risk factors include mostly in control DM and former smoker.  Dr. [name] spoke with pt.   PLAN:  Based on the patient's vascular studies and examination, pt will return to clinic in 6 months with ABI's and right LE arterial duplex. I advised pt to notify us should he develop concerns re the circulation in his legs.   I discussed in depth with the patient the nature of atherosclerosis, and emphasized the importance of maximal medical management including strict control of blood pressure, blood glucose, and lipid levels, obtaining regular exercise, and continued cessation of smoking.  The patient is aware that without maximal medical management the underlying atherosclerotic disease  process will progress, limiting the benefit of any interventions.  The patient was given information about PAD including signs, symptoms, treatment, what symptoms should prompt the patient to seek immediate medical care, and risk reduction measures to take.  Clemon Chambers, RN, MSN, FNP-C [facility] Phone: [phone]  Clinic MD: [name]  04/19/2016 1:23 PM

## 2016-04-21 NOTE — Telephone Encounter (Signed)
Spoke with Anthony [name], paperwork received and is being processed. Anthony [name] is taking care of this - nothing further needed at this time.  Will send to Anthony [name] to ensure this is completed and documented as being faxed.

## 2016-04-27 ENCOUNTER — Ambulatory Visit (INDEPENDENT_AMBULATORY_CARE_PROVIDER_SITE_OTHER): Payer: Medicare Other | Admitting: Podiatry

## 2016-04-27 ENCOUNTER — Encounter: Payer: Self-pay | Admitting: Podiatry

## 2016-04-27 DIAGNOSIS — M79676 Pain in unspecified toe(s): Secondary | ICD-10-CM

## 2016-04-27 DIAGNOSIS — B351 Tinea unguium: Secondary | ICD-10-CM | POA: Diagnosis not present

## 2016-04-27 NOTE — Progress Notes (Signed)
Patient ID: Anthony [name], male   DOB: Jan 28, 1946, 70 y.o.   MRN: [id number] HPI  Complaint:  Visit Type: Patient returns to my office for continued preventative foot care services. Complaint: Patient states" my nails have grown long and thick and become painful to walk and wear shoes"  . This patient  presents for preventative foot care services. No changes to ROS  Podiatric Exam: Vascular: dorsalis pedis and posterior tibial pulses are absent. Capillary return is slow to refill. Temperature gradient is negative.  Sensorium: Normal Semmes Weinstein monofilament test. Normal tactile sensation bilaterally.  Nail Exam: Pt has thick disfigured discolored nails with subungual debris noted bilateral entire nail hallux through fifth toenails Ulcer Exam: There is no evidence of ulcer or pre-ulcerative changes or infection. Orthopedic Exam: Muscle tone and strength are WNL. No limitations in general ROM. No crepitus or effusions noted. Foot type and digits show no abnormalities. Bony prominences are unremarkable. Skin: No Porokeratosis. No infection or ulcers  Diagnosis:  Onychomycosis, Pain in right toe, pain in left toes  Treatment & Plan Procedures and Treatment: Consent by patient was obtained for treatment procedures. The patient understood the discussion of treatment and procedures well. All questions were answered thoroughly reviewed. Debridement of mycotic and hypertrophic toenails, 1 through 5 bilateral and clearing of subungual debris. No ulceration, no infection noted.  Return Visit-Office Procedure: Patient instructed to return to the office for a follow up visit 3 months for continued evaluation and treatment.   Gardiner Barefoot DPM

## 2016-05-07 NOTE — Telephone Encounter (Signed)
Please advise if this has been taken care of. Thanks.  

## 2016-05-25 NOTE — Addendum Note (Signed)
Addended by: Mena Goes on: 05/25/2016 04:04 PM   Modules accepted: Orders

## 2016-06-07 ENCOUNTER — Other Ambulatory Visit (HOSPITAL_COMMUNITY): Payer: Self-pay | Admitting: *Deleted

## 2016-06-07 MED ORDER — BISOPROLOL FUMARATE 5 MG PO TABS: 5.0000 mg | ORAL_TABLET | Freq: Every day | ORAL | Status: DC

## 2016-07-02 NOTE — Telephone Encounter (Signed)
Per documents being scanned, this has been taken care of. Nothing further needed.

## 2016-07-27 ENCOUNTER — Ambulatory Visit (INDEPENDENT_AMBULATORY_CARE_PROVIDER_SITE_OTHER): Payer: Medicare Other | Admitting: Podiatry

## 2016-07-27 ENCOUNTER — Encounter: Payer: Self-pay | Admitting: Podiatry

## 2016-07-27 DIAGNOSIS — I739 Peripheral vascular disease, unspecified: Secondary | ICD-10-CM | POA: Diagnosis not present

## 2016-07-27 DIAGNOSIS — B351 Tinea unguium: Secondary | ICD-10-CM | POA: Diagnosis not present

## 2016-07-27 DIAGNOSIS — M79676 Pain in unspecified toe(s): Secondary | ICD-10-CM

## 2016-07-27 NOTE — Progress Notes (Signed)
Patient ID: Anthony [name], male   DOB: Jan 28, 1946, 70 y.o.   MRN: [id number] HPI  Complaint:  Visit Type: Patient returns to my office for continued preventative foot care services. Complaint: Patient states" my nails have grown long and thick and become painful to walk and wear shoes"  . This patient  presents for preventative foot care services. No changes to ROS  Podiatric Exam: Vascular: dorsalis pedis and posterior tibial pulses are absent. Capillary return is slow to refill. Temperature gradient is negative.  Sensorium: Normal Semmes Weinstein monofilament test. Normal tactile sensation bilaterally.  Nail Exam: Pt has thick disfigured discolored nails with subungual debris noted bilateral entire nail hallux through fifth toenails Ulcer Exam: There is no evidence of ulcer or pre-ulcerative changes or infection. Orthopedic Exam: Muscle tone and strength are WNL. No limitations in general ROM. No crepitus or effusions noted. Foot type and digits show no abnormalities. Bony prominences are unremarkable. Skin: No Porokeratosis. No infection or ulcers  Diagnosis:  Onychomycosis, Pain in right toe, pain in left toes  Treatment & Plan Procedures and Treatment: Consent by patient was obtained for treatment procedures. The patient understood the discussion of treatment and procedures well. All questions were answered thoroughly reviewed. Debridement of mycotic and hypertrophic toenails, 1 through 5 bilateral and clearing of subungual debris. No ulceration, no infection noted.  Return Visit-Office Procedure: Patient instructed to return to the office for a follow up visit 3 months for continued evaluation and treatment.   Gardiner Barefoot DPM

## 2016-09-13 ENCOUNTER — Ambulatory Visit (INDEPENDENT_AMBULATORY_CARE_PROVIDER_SITE_OTHER): Payer: Medicare Other | Admitting: Gastroenterology

## 2016-09-13 ENCOUNTER — Encounter: Payer: Self-pay | Admitting: Gastroenterology

## 2016-09-13 VITALS — BP 158/76 | HR 84 | Ht 69.0 in | Wt 268.0 lb

## 2016-09-13 DIAGNOSIS — Z800 Family history of malignant neoplasm of digestive organs: Secondary | ICD-10-CM | POA: Diagnosis not present

## 2016-09-13 MED ORDER — NA SULFATE-K SULFATE-MG SULF 17.5-3.13-1.6 GM/177ML PO SOLN: 1.0000 | Freq: Once | ORAL | 0 refills | Status: AC

## 2016-09-13 NOTE — Progress Notes (Signed)
HPI: This is a very pleasant 70 year old man  who was referred to me by [name], MD  to evaluate  family history colon cancer .    Chief complaint is family history of colon cancer, his father had colon cancer    Colonoscopy 2006; our records only show to color pictures from this report with no text. Colonoscopy 2011 Dr. Louie Casa In [place] October. Diverticulosis was found. One small (removed with biopsy forceps) hyperplastic by pathology polyps was found.  Significant PAD requiring  right external iliac, common femoral, superficial femoral endarterectomy with bovine pericardial patch angioplasty on 10/10/2014 for lifestyle limiting claudication which was not responsive to conservative treatment  LV EF 45-50% per 04/2015 echo  His father had colon cancer in his late 60's  No gi symptoms. Specifically no overt GI bleeding, no significant constipation or diarrhea that he is concerned about.  Recent cruise and he has put on some weight.   Review of systems: Pertinent positive and negative review of systems were noted in the above HPI section. Complete review of systems was performed and was otherwise normal.   Past Medical History:  Diagnosis Date  . Arthritis   . CAD (coronary artery disease)    stent placed 06/02/2000  . Chronic respiratory failure with hypoxia ([place]) 08/11/2012  . Congestive heart failure ([place])   . COPD (chronic obstructive pulmonary disease) ([place]) 08/11/2012  . Diabetes mellitus ([place])   . Hyperlipidemia   . Hypertension   . Lumbar stenosis   . OSA (obstructive sleep apnea) 08/11/2012   cpcap  . Peripheral vascular disease ([place])   . Pneumonia 1999  . Shortness of breath    with exertion    Past Surgical History:  Procedure Laterality Date  . ABDOMINAL ANGIOGRAM  Dec. 4, 2013  . ABDOMINAL AORTAGRAM N/A 11/29/2012   Procedure: ABDOMINAL Maxcine Ham;  Surgeon: Serafina Mitchell, MD;  Location[facility] CATH LAB;  Service: Cardiovascular;  Laterality: N/A;  .  ABDOMINAL AORTAGRAM N/A 09/03/2014   Procedure: ABDOMINAL AORTAGRAM;  Surgeon: Serafina Mitchell, MD;  Location: [facility] CATH LAB;  Service: Cardiovascular;  Laterality: N/A;  . ANGIOPLASTY  06/02/2000   Stent  . ANTERIOR LATERAL LUMBAR FUSION 4 LEVELS Right 05/28/2014   Procedure: Right L4-5 L3-4 L2-3, L1-2  Anterior lateral lumbar fusion with percutaneaous pedicle screws. Lumbar four/five, three/four, two/three and possible two/one;  Surgeon: Erline Levine, MD;  Location: [place] [facility]UR[facility] OR[facility];  Service: Neurosurgery;  Laterality: Right;  Lumbar One-Five Fusion with Percutaneous Screws  . [place]   right foot  . CARDIAC CATHETERIZATION    . CARPAL TUNNEL RELEASE  09/30/2006   right wrist  . CERVICAL FUSION  Nov 2002  . COLONOSCOPY W/ BIOPSIES AND POLYPECTOMY     benign  . ENDARTERECTOMY FEMORAL Right 10/10/2014   Procedure: RIGHT FEMORAL ARTERY ENDARTERECTOMY  WITH VASCU GUARD PATCH ANGIOPLASTY;  Surgeon: Serafina Mitchell, MD;  Location: [place];  Service: Vascular;  Laterality: Right;  . EYE SURGERY Bilateral 2014   cataracts  . HAMMER TOE SURGERY  June 2007 & August 2008   left foot  . HAND ARTHROPLASTY  Oct 2010   right thumb  . Heart catherization  03/26/1999  . [place] SURGERY  Dec 1997 & Ampril 2005  . LUMBAR PERCUTANEOUS PEDICLE SCREW 4 LEVEL N/A 05/28/2014   Procedure: LUMBAR PERCUTANEOUS PEDICLE SCREW 4 LEVEL;  Surgeon: Erline Levine, MD;  Location: [place] NEUR[facility] ORS;  Service: Neurosurgery;  Laterality: N/A;  .  [place]   right foot  . SPINE SURGERY  04/21/2004   Lower back disk gurgery- Lumbar stenosis  . TONSILLECTOMY  1965    Current Outpatient Prescriptions  Medication Sig Dispense Refill  . albuterol (PROAIR HFA) 108 (90 Base) MCG/ACT inhaler Inhale 2 puffs into the lungs every 6 (six) hours as needed for wheezing or shortness of breath. 3 Inhaler 3  . aspirin 325 MG tablet Take 325 mg by mouth daily.    . B Complex Vitamins (VITAMIN B COMPLEX PO) Take 1 tablet by  mouth 2 (two) times daily.     . B Complex-Folic Acid (BENFOTIAMINE MULTI-B PO) Take 2 tablets by mouth daily.    . bisoprolol (ZEBETA) 5 MG tablet Take 1 tablet (5 mg total) by mouth daily. 90 tablet 2  . CINNAMON PO Take 2 tablets by mouth daily.    Marland Kitchen doxazosin (CARDURA) 2 MG tablet Take 2 mg by mouth daily.    Marland Kitchen doxycycline (ADOXA) 50 MG tablet Take 50 mg by mouth daily.    . fish oil-omega-3 fatty acids 1000 MG capsule Take 3 g by mouth daily.     . furosemide (LASIX) 20 MG tablet Take 40 mg by mouth daily.    Marland Kitchen GARLIC PO Take 1 tablet by mouth daily.    Marland Kitchen glipiZIDE (GLUCOTROL) 10 MG tablet Take 10 mg by mouth daily before breakfast.    . isosorbide mononitrate (IMDUR) 30 MG 24 hr tablet Take 1 tablet (30 mg total) by mouth daily. 90 tablet 2  . L-Lysine 1000 MG TABS Take 1,000 mg by mouth daily.     Marland Kitchen losartan (COZAAR) 100 MG tablet Take 100 mg by mouth daily.    . metFORMIN (GLUCOPHAGE) 1000 MG tablet Take 1,000 mg by mouth 2 (two) times daily with a meal.    . Multiple Vitamin (MULTIVITAMIN) tablet Take 1 tablet by mouth daily.    . pentoxifylline (TRENTAL) 400 MG CR tablet Take 1 tablet by mouth  twice a day 180 tablet 2  . potassium chloride SA (K-DUR,KLOR-CON) 20 MEQ tablet Take 20 mEq by mouth 2 (two) times daily.    Marland Kitchen rOPINIRole (REQUIP) 1 MG tablet Take 1 mg by mouth at bedtime.    . simvastatin (ZOCOR) 40 MG tablet Take 40 mg by mouth every evening.    . sitaGLIPtin (JANUVIA) 100 MG tablet Take 100 mg by mouth daily.    Marland Kitchen tiotropium (SPIRIVA HANDIHALER) 18 MCG inhalation capsule Place 1 capsule (18 mcg total) into inhaler and inhale daily. 90 capsule 3   No current facility-administered medications for this visit.     Allergies as of 09/13/2016  . (No Known Allergies)    Family History  Problem Relation Age of Onset  . Heart disease Father 34  . Cancer Father   . Hyperlipidemia Father   . Hypertension Father   . Heart attack Father   . Cancer Mother   . Deep vein  thrombosis Mother     Varicose veins  . Diabetes Mother   . Hyperlipidemia Mother   . Hypertension Mother     Social History   Social History  . Marital status: Married    Spouse name: N/A  . Number of children: N/A  . Years of education: N/A   Occupational History  . retired    Social History Main Topics  . Smoking status: Former Smoker    Packs/day: 2.00    Years: 28.00    Types:  Cigarettes    Quit date: 08/03/1987  . Smokeless tobacco: Never Used  . Alcohol use Yes     Comment: weekly  . Drug use: No  . Sexual activity: Not on file   Other Topics Concern  . Not on file   Social History Narrative  . No narrative on file     Physical Exam: BP (!) 158/76   Pulse 84   Ht 5\' 9"  (1.753 m)   Wt 268 lb (121.6 kg)   BMI 39.58 kg/m  Constitutional: generally well-appearing Psychiatric: alert and oriented x3 Eyes: extraocular movements intact Mouth: oral pharynx moist, no lesions Neck: supple no lymphadenopathy Cardiovascular: heart regular rate and rhythm Lungs: clear to auscultation bilaterally Abdomen: soft, nontender, nondistended, no obvious ascites, no peritoneal signs, normal bowel sounds Extremities: no lower extremity edema bilaterally Skin: no lesions on visible extremities   Assessment and plan: 70 y.o. male with  Elevated risk for colon cancer, first degree relative with colon cancer  I recommended we repeat colonoscopy for high-risk screening at his soonest convenience. His father had colon cancer in his late 59s. I see no reason for any further blood tests or imaging studies prior to then. He has no GI symptoms that are concerning.   Owens Loffler, MD [facility] 09/13/2016, 9:00 AM  Cc: [name], MD

## 2016-09-13 NOTE — Patient Instructions (Addendum)
You will be set up for a colonoscopy for FH of colon cancer, screening.

## 2016-10-13 ENCOUNTER — Encounter: Payer: Self-pay | Admitting: Gastroenterology

## 2016-10-14 ENCOUNTER — Other Ambulatory Visit (HOSPITAL_COMMUNITY): Payer: Self-pay | Admitting: Adult Health

## 2016-10-22 ENCOUNTER — Encounter (HOSPITAL_COMMUNITY): Payer: Self-pay | Admitting: Internal Medicine

## 2016-10-22 ENCOUNTER — Ambulatory Visit (HOSPITAL_COMMUNITY)
Admission: RE | Admit: 2016-10-22 | Discharge: 2016-10-22 | Disposition: A | Discharge status: Home / Self Care | Payer: Medicare Other | Source: Ambulatory Visit | Attending: Internal Medicine | Admitting: Internal Medicine

## 2016-10-22 VITALS — BP 144/78 | HR 69 | Wt 265.8 lb

## 2016-10-22 DIAGNOSIS — J9611 Chronic respiratory failure with hypoxia: Secondary | ICD-10-CM | POA: Insufficient documentation

## 2016-10-22 DIAGNOSIS — Z87891 Personal history of nicotine dependence: Secondary | ICD-10-CM | POA: Diagnosis not present

## 2016-10-22 DIAGNOSIS — I5032 Chronic diastolic (congestive) heart failure: Secondary | ICD-10-CM | POA: Diagnosis present

## 2016-10-22 DIAGNOSIS — Z7984 Long term (current) use of oral hypoglycemic drugs: Secondary | ICD-10-CM | POA: Diagnosis not present

## 2016-10-22 DIAGNOSIS — E785 Hyperlipidemia, unspecified: Secondary | ICD-10-CM | POA: Insufficient documentation

## 2016-10-22 DIAGNOSIS — I5022 Chronic systolic (congestive) heart failure: Secondary | ICD-10-CM | POA: Diagnosis not present

## 2016-10-22 DIAGNOSIS — E119 Type 2 diabetes mellitus without complications: Secondary | ICD-10-CM | POA: Insufficient documentation

## 2016-10-22 DIAGNOSIS — G4733 Obstructive sleep apnea (adult) (pediatric): Secondary | ICD-10-CM | POA: Insufficient documentation

## 2016-10-22 DIAGNOSIS — J449 Chronic obstructive pulmonary disease, unspecified: Secondary | ICD-10-CM | POA: Diagnosis not present

## 2016-10-22 DIAGNOSIS — Z7982 Long term (current) use of aspirin: Secondary | ICD-10-CM | POA: Insufficient documentation

## 2016-10-22 DIAGNOSIS — Z79899 Other long term (current) drug therapy: Secondary | ICD-10-CM | POA: Diagnosis not present

## 2016-10-22 DIAGNOSIS — M1990 Unspecified osteoarthritis, unspecified site: Secondary | ICD-10-CM | POA: Insufficient documentation

## 2016-10-22 DIAGNOSIS — M48061 Spinal stenosis, lumbar region without neurogenic claudication: Secondary | ICD-10-CM | POA: Insufficient documentation

## 2016-10-22 DIAGNOSIS — I10 Essential (primary) hypertension: Secondary | ICD-10-CM

## 2016-10-22 DIAGNOSIS — I110 Hypertensive heart disease with heart failure: Secondary | ICD-10-CM | POA: Diagnosis not present

## 2016-10-22 DIAGNOSIS — I739 Peripheral vascular disease, unspecified: Secondary | ICD-10-CM | POA: Diagnosis not present

## 2016-10-22 DIAGNOSIS — I2510 Atherosclerotic heart disease of native coronary artery without angina pectoris: Secondary | ICD-10-CM | POA: Diagnosis not present

## 2016-10-22 NOTE — Patient Instructions (Signed)
We will contact you in 6 months to schedule your next appointment.  

## 2016-10-22 NOTE — Progress Notes (Signed)
Patient ID: [name], male   DOB: 1946/12/05, 70 y.o.   MRN: [id number]   CARDIOLOGY CLINIC NOTE  Patient ID: [place] [name], male   DOB: 24-Aug-1946, 70 y.o.   MRN: [id number]  PCP: [name], MD    HPI: Tywann is a 70 y/o male with h/o obesity, HTN, HL, severe osteoarthritis, DM2 (diagnosed 2009), OSA (intolerant of CPAP), former smoker (quit 1989), CAD s/p stent 2001 and PAD. He is the father-in-law of Dr. [name] in Interventional Radiology.    Had cath in [place] in 2001. Was told he had 70% blockage in one artery and the one in the back was totally blocked. Eventually underwent stenting of that arter. No caths since. Myoview 10/14: Normal. LV Ejection Fraction: 56%. LV Wall Motion: NL LV Function; NL Wall Motion  Has severe PAD R>L followed by Dr. [name]. Now status post right external iliac, common femoral, superficial femoral endarterectomy with bovine pericardial patch angioplasty on 10/10/2014  Saw Dr. Halford Chessman and placed on home O2 but now weaned off with inhalers. Checks sats with pulse oximeter  He returns for yearly follow up. Overall feeling ok. Able to walk 1/4 mile at a time. SOB with inclines.  Went to [place] in February and March and did a lot of walking.  Claudication improved. No CP/pressure. No edema/PND/Orthopnea. SBP at home 120s.   Taking all medications. Using CPAP nightly. Weight stable.   PFTs with moderate COPD FEV1 1.83 (62%) FVC   2.84 (66%) FEF 25-75% 0.79 (29%) DLCO 67   ECHO 10/13 EF 50-55%  ECHO 5/16: EF 45-50% mold to moderate RV dysfunction Grade II DD  CXR with elevated R hemidiaphragm. Sleep study with severe OS - AHI 39.     Past Medical History:  Diagnosis Date  . Arthritis   . CAD (coronary artery disease)    stent placed 06/02/2000  . Chronic respiratory failure with hypoxia ([name]) 08/11/2012  . Congestive heart failure ([name])   . COPD (chronic obstructive pulmonary disease) ([place]) 08/11/2012  . Diabetes mellitus ([name])   .  Hyperlipidemia   . Hypertension   . Lumbar stenosis   . OSA (obstructive sleep apnea) 08/11/2012   cpcap  . Peripheral vascular disease ([name])   . Pneumonia 1999  . Shortness of breath    with exertion    Current Outpatient Prescriptions  Medication Sig Dispense Refill  . albuterol (PROAIR HFA) 108 (90 Base) MCG/ACT inhaler Inhale 2 puffs into the lungs every 6 (six) hours as needed for wheezing or shortness of breath. 3 Inhaler 3  . aspirin 325 MG tablet Take 325 mg by mouth daily.    . B Complex Vitamins (VITAMIN B COMPLEX PO) Take 1 tablet by mouth 2 (two) times daily.     . B Complex-Folic Acid (BENFOTIAMINE MULTI-B PO) Take 2 tablets by mouth daily.    . bisoprolol (ZEBETA) 5 MG tablet Take 1 tablet (5 mg total) by mouth daily. 90 tablet 2  . CINNAMON PO Take 2 tablets by mouth daily.    Marland Kitchen doxazosin (CARDURA) 2 MG tablet Take 2 mg by mouth daily.    Marland Kitchen doxycycline (ADOXA) 50 MG tablet Take 50 mg by mouth daily.    . fish oil-omega-3 fatty acids 1000 MG capsule Take 3 g by mouth daily.     . furosemide (LASIX) 20 MG tablet Take 20 mg by mouth 2 (two) times daily.     Marland Kitchen GARLIC PO Take 1 tablet by mouth daily.    Marland Kitchen  glipiZIDE (GLUCOTROL) 10 MG tablet Take 10 mg by mouth daily before breakfast.    . isosorbide mononitrate (IMDUR) 30 MG 24 hr tablet TAKE 1 TABLET BY MOUTH  EVERY DAY 90 tablet 0  . L-Lysine 1000 MG TABS Take 1,000 mg by mouth daily.     Marland Kitchen losartan (COZAAR) 100 MG tablet Take 100 mg by mouth daily.    . metFORMIN (GLUCOPHAGE) 1000 MG tablet Take 1,000 mg by mouth 2 (two) times daily with a meal.    . Multiple Vitamin (MULTIVITAMIN) tablet Take 1 tablet by mouth daily.    . pentoxifylline (TRENTAL) 400 MG CR tablet Take 1 tablet by mouth  twice a day 180 tablet 2  . potassium chloride SA (K-DUR,KLOR-CON) 20 MEQ tablet Take 20 mEq by mouth 2 (two) times daily.    Marland Kitchen rOPINIRole (REQUIP) 1 MG tablet Take 1 mg by mouth at bedtime.    . simvastatin (ZOCOR) 40 MG tablet Take 40 mg  by mouth every evening.    . sitaGLIPtin (JANUVIA) 100 MG tablet Take 100 mg by mouth daily.    Marland Kitchen tiotropium (SPIRIVA HANDIHALER) 18 MCG inhalation capsule Place 1 capsule (18 mcg total) into inhaler and inhale daily. 90 capsule 3   No current facility-administered medications for this encounter.      No Known Allergies  Social History   Social History  . Marital status: Married    Spouse name: N/A  . Number of children: N/A  . Years of education: N/A   Occupational History  . retired    Social History Main Topics  . Smoking status: Former Smoker    Packs/day: 2.00    Years: 28.00    Types: Cigarettes    Quit date: 08/03/1987  . Smokeless tobacco: Never Used  . Alcohol use Yes     Comment: weekly  . Drug use: No  . Sexual activity: Not on file   Other Topics Concern  . Not on file   Social History Narrative  . No narrative on file    Family History  Problem Relation Age of Onset  . Heart disease Father 40  . Cancer Father   . Hyperlipidemia Father   . Hypertension Father   . Heart attack Father   . Cancer Mother   . Deep vein thrombosis Mother     Varicose veins  . Diabetes Mother   . Hyperlipidemia Mother   . Hypertension Mother     PHYSICAL EXAM: Vitals:   10/22/16 1021  BP: (!) 144/78  Pulse: 69  SpO2: 93%  Weight: 265 lb 12.8 oz (120.6 kg)   Wt Readings from Last 3 Encounters:  10/22/16 265 lb 12.8 oz (120.6 kg)  09/13/16 268 lb (121.6 kg)  04/19/16 259 lb (117.5 kg)    General:  No acute distress. No respiratory difficulty HEENT: normal +rosacea  Neck: supple. JVP 5-6 . Carotids 2+ bilat; no bruits. No lymphadenopathy or thryomegaly appreciated. Cor: PMI nondisplaced. Regular rate & rhythm. No rubs, gallops or murmurs. Lungs: clear with mildly diminished  air movment throughout Abdomen: soft, obese nontender, nondistended. No bruits or masses. Good bowel sounds. Extremities: no cyanosis, clubbing, rash, R and LLE no edema.  Neuro: alert &  oriented x 3, cranial nerves grossly intact. moves all 4 extremities w/o difficulty. Affect pleasant.   EKG: Sinus Brady 59 bpm PRWP No ST-T wave abnormalities.    ASSESSMENT & PLAN:  1) CAD- Doing well with no evidence of angina. Last cath in  [place] with stent. Myoview 2014 normal EF and LV wall motion . On Statin, BB, + aspirin. Low threshold to repeat cath if any ischemic symptoms. --Encouraged exercise and weight loss 2) Chronic Diastolic HF- Volume status ok. EF 45-50% with mild RV dysfunction on ECHO 5/16. Volume status ok  3) PAD- Claudication improved s/p femoral artery endarterectomy in 2015. Followed by [facility]. Per Dr [name] 4) HTN- BP 110s here. But mostly 110-130. Will continue current regimen.  5) OSA on CPAP- Continue nightly.  6)  Hyperlipidemia - Followed by Dr. Virgina Jock.   RTC in 6 months.   Glori Bickers, MD 10:56 AM

## 2016-10-26 ENCOUNTER — Ambulatory Visit (AMBULATORY_SURGERY_CENTER): Payer: Medicare Other | Admitting: Gastroenterology

## 2016-10-26 ENCOUNTER — Encounter: Payer: Self-pay | Admitting: Gastroenterology

## 2016-10-26 VITALS — BP 121/56 | HR 54 | Temp 98.2°F | Resp 16 | Ht 69.0 in | Wt 268.0 lb

## 2016-10-26 DIAGNOSIS — Z1211 Encounter for screening for malignant neoplasm of colon: Secondary | ICD-10-CM

## 2016-10-26 DIAGNOSIS — Z1212 Encounter for screening for malignant neoplasm of rectum: Secondary | ICD-10-CM

## 2016-10-26 DIAGNOSIS — Z800 Family history of malignant neoplasm of digestive organs: Secondary | ICD-10-CM

## 2016-10-26 DIAGNOSIS — K5730 Diverticulosis of large intestine without perforation or abscess without bleeding: Secondary | ICD-10-CM

## 2016-10-26 DIAGNOSIS — D125 Benign neoplasm of sigmoid colon: Secondary | ICD-10-CM

## 2016-10-26 LAB — GLUCOSE, CAPILLARY
Glucose-Capillary: 176 mg/dL — ABNORMAL HIGH (ref 65–99)
Glucose-Capillary: 190 mg/dL — ABNORMAL HIGH (ref 65–99)

## 2016-10-26 MED ORDER — SODIUM CHLORIDE 0.9 % IV SOLN: 500.0000 mL | INTRAVENOUS | Status: DC

## 2016-10-26 NOTE — Progress Notes (Signed)
Report to PACU, RN, vss, BBS= Clear.  

## 2016-10-26 NOTE — Op Note (Signed)
[facility] Patient Name: Michaellee Weider Procedure Date: 10/26/2016 7:31 AM MRN: [id number] Endoscopist: Milus Banister , MD Age: 70 Referring MD:  Date of Birth: [birth year] Gender: Male Account #: [id number] Procedure:                Colonoscopy Indications:              Screening in patient at increased risk: Family                            history of 1st-degree relative with colorectal                            cancer (father): Colonoscopy 2006; our records only                            show to color pictures from this report with no                            text.                           Colonoscopy 2011 Dr. Louie Casa [place]                            October. Diverticulosis was found. Onesmall                            (removed with biopsy forceps)hyperplastic by                            pathology Medicines:                Monitored Anesthesia Care Procedure:                Pre-Anesthesia Assessment:                           - Prior to the procedure, a History and Physical                            was performed, and patient medications and                            allergies were reviewed. The patient's tolerance of                            previous anesthesia was also reviewed. The risks                            and benefits of the procedure and the sedation                            options and risks were discussed with the patient.                            All  questions were answered, and informed consent                            was obtained. Prior Anticoagulants: The patient has                            taken no previous anticoagulant or antiplatelet                            agents. ASA Grade Assessment: II - A patient with                            mild systemic disease. After reviewing the risks                            and benefits, the patient was deemed in                            satisfactory condition to undergo  the procedure.                           After obtaining informed consent, the colonoscope                            was passed under direct vision. Throughout the                            procedure, the patient's blood pressure, pulse, and                            oxygen saturations were monitored continuously. The                            Model CF-HQ190L [id number]) scope was introduced                            through the anus and advanced to the the cecum,                            identified by appendiceal orifice and ileocecal                            valve. The colonoscopy was performed without                            difficulty. The patient tolerated the procedure                            well. The quality of the bowel preparation was                            excellent. The ileocecal valve, appendiceal  orifice, and rectum were photographed. Scope In: 7:38:00 AM Scope Out: 7:48:08 AM Scope Withdrawal Time: 0 hours 7 minutes 19 seconds  Total Procedure Duration: 0 hours 10 minutes 8 seconds  Findings:                 A 4 mm polyp was found in the sigmoid colon. The                            polyp was sessile. The polyp was removed with a                            cold snare. Resection and retrieval were complete.                           Many small and large-mouthed diverticula were found                            in the left colon.                           The exam was otherwise without abnormality on                            direct and retroflexion views. Complications:            No immediate complications. Estimated blood loss:                            None. Estimated Blood Loss:     Estimated blood loss: none. Impression:               - One 4 mm polyp in the sigmoid colon, removed with                            a cold snare. Resected and retrieved.                           - Diverticulosis in the left colon.                            - The examination was otherwise normal on direct                            and retroflexion views. Recommendation:           - Patient has a contact number available for                            emergencies. The signs and symptoms of potential                            delayed complications were discussed with the                            patient. Return to normal activities tomorrow.  Written discharge instructions were provided to the                            patient.                           - Resume previous diet.                           - Continue present medications.                           You will receive a letter within 2-3 weeks with the                            pathology results and my final recommendations.                           If the polyp(s) is proven to be 'pre-cancerous' on                            pathology, you will need repeat colonoscopy in 5                            years. Milus Banister, MD 10/26/2016 7:52:40 AM This report has been signed electronically.

## 2016-10-26 NOTE — Patient Instructions (Signed)
YOU HAD AN ENDOSCOPIC PROCEDURE TODAY AT THE [facility]:   Refer to the procedure report that was given to you for any specific questions about what was found during the examination.  If the procedure report does not answer your questions, please call your gastroenterologist to clarify.  If you requested that your care partner not be given the details of your procedure findings, then the procedure report has been included in a sealed envelope for you to review at your convenience later.  YOU SHOULD EXPECT: Some feelings of bloating in the abdomen. Passage of more gas than usual.  Walking can help get rid of the air that was put into your GI tract during the procedure and reduce the bloating. If you had a lower endoscopy (such as a colonoscopy or flexible sigmoidoscopy) you may notice spotting of blood in your stool or on the toilet paper. If you underwent a bowel prep for your procedure, you may not have a normal bowel movement for a few days.  Please Note:  You might notice some irritation and congestion in your nose or some drainage.  This is from the oxygen used during your procedure.  There is no need for concern and it should clear up in a day or so.  SYMPTOMS TO REPORT IMMEDIATELY:   Following lower endoscopy (colonoscopy or flexible sigmoidoscopy):  Excessive amounts of blood in the stool  Significant tenderness or worsening of abdominal pains  Swelling of the abdomen that is new, acute  Fever of 100F or higher    For urgent or emergent issues, a gastroenterologist can be reached at any hour by calling [phone].   DIET:  We do recommend a small meal at first, but then you may proceed to your regular diet.  Drink plenty of fluids but you should avoid alcoholic beverages for 24 hours.  ACTIVITY:  You should plan to take it easy for the rest of today and you should NOT DRIVE or use heavy machinery until tomorrow (because of the sedation medicines used during the test).     FOLLOW UP: Our staff will call the number listed on your records the next business day following your procedure to check on you and address any questions or concerns that you may have regarding the information given to you following your procedure. If we do not reach you, we will leave a message.  However, if you are feeling well and you are not experiencing any problems, there is no need to return our call.  We will assume that you have returned to your regular daily activities without incident.  If any biopsies were taken you will be contacted by phone or by letter within the next 1-3 weeks.  Please call us at [phone] if you have not heard about the biopsies in 3 weeks.    SIGNATURES/CONFIDENTIALITY: You and/or your care partner have signed paperwork which will be entered into your electronic medical record.  These signatures attest to the fact that that the information above on your After Visit Summary has been reviewed and is understood.  Full responsibility of the confidentiality of this discharge information lies with you and/or your care-partner.   Resume medications. Information given on polyps and diverticulosis. 

## 2016-10-27 ENCOUNTER — Telehealth: Payer: Self-pay

## 2016-10-27 NOTE — Telephone Encounter (Signed)
  Follow up Call-  Call back number 10/26/2016  Post procedure Call Back phone  # [phone]  Permission to leave phone message Yes  Some recent data might be hidden     Patient questions:  Do you have a fever, pain , or abdominal swelling? No. Pain Score  0 *  Have you tolerated food without any problems? Yes.    Have you been able to return to your normal activities? Yes.    Do you have any questions about your discharge instructions: Diet   No. Medications  No. Follow up visit  No.  Do you have questions or concerns about your Care? No.  Actions: * If pain score is 4 or above: No action needed, pain <4.

## 2016-11-01 ENCOUNTER — Encounter: Payer: Self-pay | Admitting: Gastroenterology

## 2016-11-10 ENCOUNTER — Encounter: Payer: Self-pay | Admitting: Family

## 2016-11-12 ENCOUNTER — Ambulatory Visit (INDEPENDENT_AMBULATORY_CARE_PROVIDER_SITE_OTHER): Payer: Medicare Other | Admitting: Podiatry

## 2016-11-12 DIAGNOSIS — B351 Tinea unguium: Secondary | ICD-10-CM | POA: Diagnosis not present

## 2016-11-12 DIAGNOSIS — M79676 Pain in unspecified toe(s): Secondary | ICD-10-CM

## 2016-11-12 NOTE — Progress Notes (Signed)
Patient ID: [name], male   DOB: 07/17/1946, 70 y.o.   MRN: [id number] HPI  Complaint:  Visit Type: Patient returns to my office for continued preventative foot care services. Complaint: Patient states" my nails have grown long and thick and become painful to walk and wear shoes"  . This patient  presents for preventative foot care services. No changes to ROS  Podiatric Exam: Vascular: dorsalis pedis and posterior tibial pulses are absent. Capillary return is slow to refill. Temperature gradient is negative.  Sensorium: Normal Semmes Weinstein monofilament test. Normal tactile sensation bilaterally.  Nail Exam: Pt has thick disfigured discolored nails with subungual debris noted bilateral entire nail hallux through fifth toenails Ulcer Exam: There is no evidence of ulcer or pre-ulcerative changes or infection. Orthopedic Exam: Muscle tone and strength are WNL. No limitations in general ROM. No crepitus or effusions noted. Foot type and digits show no abnormalities. Bony prominences are unremarkable. Skin: No Porokeratosis. No infection or ulcers  Diagnosis:  Onychomycosis, Pain in right toe, pain in left toes  Treatment & Plan Procedures and Treatment: Consent by patient was obtained for treatment procedures. The patient understood the discussion of treatment and procedures well. All questions were answered thoroughly reviewed. Debridement of mycotic and hypertrophic toenails, 1 through 5 bilateral and clearing of subungual debris. No ulceration, no infection noted.  Return Visit-Office Procedure: Patient instructed to return to the office for a follow up visit 3 months for continued evaluation and treatment.   Pardeep Pautz DPM  

## 2016-11-22 ENCOUNTER — Other Ambulatory Visit: Payer: Self-pay | Admitting: *Deleted

## 2016-11-22 ENCOUNTER — Ambulatory Visit (INDEPENDENT_AMBULATORY_CARE_PROVIDER_SITE_OTHER)
Admission: RE | Admit: 2016-11-22 | Discharge: 2016-11-22 | Disposition: A | Discharge status: Home / Self Care | Payer: Medicare Other | Source: Ambulatory Visit | Attending: Family | Admitting: Family

## 2016-11-22 ENCOUNTER — Ambulatory Visit (HOSPITAL_COMMUNITY)
Admission: RE | Admit: 2016-11-22 | Discharge: 2016-11-22 | Disposition: A | Discharge status: Home / Self Care | Payer: Medicare Other | Source: Ambulatory Visit | Attending: Family | Admitting: Family

## 2016-11-22 ENCOUNTER — Other Ambulatory Visit: Payer: Self-pay | Admitting: Family

## 2016-11-22 ENCOUNTER — Encounter: Payer: Self-pay | Admitting: Family

## 2016-11-22 ENCOUNTER — Ambulatory Visit (INDEPENDENT_AMBULATORY_CARE_PROVIDER_SITE_OTHER): Payer: Medicare Other | Admitting: Family

## 2016-11-22 VITALS — BP 144/74 | HR 61 | Temp 98.4°F | Ht 69.0 in | Wt 264.0 lb

## 2016-11-22 DIAGNOSIS — Z87891 Personal history of nicotine dependence: Secondary | ICD-10-CM

## 2016-11-22 DIAGNOSIS — I779 Disorder of arteries and arterioles, unspecified: Secondary | ICD-10-CM

## 2016-11-22 DIAGNOSIS — Z48812 Encounter for surgical aftercare following surgery on the circulatory system: Secondary | ICD-10-CM

## 2016-11-22 DIAGNOSIS — Z9862 Peripheral vascular angioplasty status: Secondary | ICD-10-CM

## 2016-11-22 DIAGNOSIS — I739 Peripheral vascular disease, unspecified: Secondary | ICD-10-CM

## 2016-11-22 LAB — VAS US LOWER EXTREMITY ARTERIAL DUPLEX
RIGHT ANT DIST TIBAL SYS PSV: 27 cm/s
RIGHT POST TIB DIST SYS: 38 cm/s
Right popliteal prox sys PSV: 28 cm/s
Right super femoral dist sys PSV: -70 cm/s
Right super femoral mid sys PSV: -171 cm/s
Right super femoral prox sys PSV: -410 cm/s

## 2016-11-22 NOTE — Progress Notes (Signed)
[facility] [place]   CC: Follow up peripheral artery occlusive disease  History of Present Illness Anthony [name] is a 70 y.o. male patient of Dr. [name] who returns today for follow up status post right external iliac, common femoral, superficial femoral endarterectomy with bovine pericardial patch angioplasty on 10/10/2014 for lifestyle limiting claudication which was not responsive to conservative treatment. His postoperative course was uncomplicated. He states that he is walking significantly better.He no longer gets cramping in his calf.  He was walking about 5 miles/week as confirmed by his fit bit, less more recently, states he is limited by his dyspnea.  He reports chronic lumbar and c-spine issues. He denies non healing wounds.   He denies any history of stroke or TIA. The patient denies New Medical or Surgical History.  Pt Diabetic: Yes, states in good control with an A1C of 7.1, increased from 6.7. Pt smoker: former smoker, quit in 1989  Pt meds include: Statin :Yes Betablocker: No ASA: Yes Other anticoagulants/antiplatelets: no     Past Medical History:  Diagnosis Date  . Arthritis   . CAD (coronary artery disease)    stent placed 06/02/2000  . Chronic respiratory failure with hypoxia ([place]) 08/11/2012  . Congestive heart failure ([place])   . COPD (chronic obstructive pulmonary disease) ([place]) 08/11/2012  . Diabetes mellitus ([place])   . Hyperlipidemia   . Hypertension   . Lumbar stenosis   . OSA (obstructive sleep apnea) 08/11/2012   cpcap  . Peripheral vascular disease ([place])   . Pneumonia 1999  . Shortness of breath    with exertion    Social History Social History  Substance Use Topics  . Smoking status: Former Smoker    Packs/day: 2.00    Years: 28.00    Types: Cigarettes    Quit date: 08/03/1987  . Smokeless tobacco: Never Used  . Alcohol use Yes     Comment: weekly    Family History Family History  Problem Relation Age  of Onset  . Heart disease Father 55  . Cancer Father   . Hyperlipidemia Father   . Hypertension Father   . Heart attack Father   . Cancer Mother   . Deep vein thrombosis Mother     Varicose veins  . Diabetes Mother   . Hyperlipidemia Mother   . Hypertension Mother     Past Surgical History:  Procedure Laterality Date  . ABDOMINAL ANGIOGRAM  Dec. 4, 2013  . ABDOMINAL AORTAGRAM N/A 11/29/2012   Procedure: ABDOMINAL Maxcine Ham;  Surgeon: Serafina Mitchell, MD;  Location[facility] C[facility] LAB;  Service: Cardiovascular;  Laterality: N/A;  . ABDOMINAL AORTAGRAM N/A 09/03/2014   Procedure: ABDOMINAL AORTAGRAM;  Surgeon: Serafina Mitchell, MD;  Location: [facility] CATH LAB;  Service: Cardiovascular;  Laterality: N/A;  . ANGIOPLASTY  06/02/2000   Stent  . ANTERIOR LATERAL LUMBAR FUSION 4 LEVELS Right 05/28/2014   Procedure: Right L4-5 L3-4 L2-3, L1-2  Anterior lateral lumbar fusion with percutaneaous pedicle screws. Lumbar four/five, three/four, two/three and possible two/one;  Surgeon: Erline Levine, MD;  Location: [place] NEURO ORS;  Service: Neurosurgery;  Laterality: Right;  Lumbar One-Five Fusion with Percutaneous Screws  . [place]   right foot  . CARDIAC CATHETERIZATION    . CARPAL TUNNEL RELEASE  09/30/2006   right wrist  . CERVICAL FUSION  Nov 2002  . COLONOSCOPY W/ BIOPSIES AND POLYPECTOMY     benign  . ENDARTERECTOMY FEMORAL Right 10/10/2014   Procedure:  RIGHT FEMORAL ARTERY ENDARTERECTOMY  WITH VASCU GUARD PATCH ANGIOPLASTY;  Surgeon: Serafina Mitchell, MD;  Location: [place];  Service: Vascular;  Laterality: Right;  . EYE SURGERY Bilateral 2014   cataracts  . HAMMER TOE SURGERY  June 2007 & August 2008   left foot  . HAND ARTHROPLASTY  Oct 2010   right thumb  . Heart catherization  03/26/1999  . [place] SURGERY  Dec 1997 & Ampril 2005  . LUMBAR PERCUTANEOUS PEDICLE SCREW 4 LEVEL N/A 05/28/2014   Procedure: LUMBAR PERCUTANEOUS PEDICLE SCREW 4 LEVEL;  Surgeon: Erline Levine, MD;  Location: [place]  NEURO ORS;  Service: Neurosurgery;  Laterality: N/A;  . [place]   right foot  . SPINE SURGERY  04/21/2004   Lower back disk gurgery- Lumbar stenosis  . TONSILLECTOMY  1965    No Known Allergies  Current Outpatient Prescriptions  Medication Sig Dispense Refill  . albuterol (PROAIR HFA) 108 (90 Base) MCG/ACT inhaler Inhale 2 puffs into the lungs every 6 (six) hours as needed for wheezing or shortness of breath. 3 Inhaler 3  . aspirin 325 MG tablet Take 325 mg by mouth daily.    . B Complex Vitamins (VITAMIN B COMPLEX PO) Take 1 tablet by mouth 2 (two) times daily.     . B Complex-Folic Acid (BENFOTIAMINE MULTI-B PO) Take 2 tablets by mouth daily.    . bisoprolol (ZEBETA) 5 MG tablet Take 1 tablet (5 mg total) by mouth daily. 90 tablet 2  . CINNAMON PO Take 2 tablets by mouth daily.    Marland Kitchen doxazosin (CARDURA) 2 MG tablet Take 2 mg by mouth daily.    Marland Kitchen doxycycline (ADOXA) 50 MG tablet Take 50 mg by mouth daily.    . fish oil-omega-3 fatty acids 1000 MG capsule Take 3 g by mouth daily.     . furosemide (LASIX) 20 MG tablet Take 20 mg by mouth 2 (two) times daily.     Marland Kitchen GARLIC PO Take 1 tablet by mouth daily.    Marland Kitchen glipiZIDE (GLUCOTROL) 10 MG tablet Take 10 mg by mouth daily before breakfast.    . isosorbide dinitrate (ISORDIL) 30 MG tablet     . isosorbide mononitrate (IMDUR) 30 MG 24 hr tablet TAKE 1 TABLET BY MOUTH  EVERY DAY 90 tablet 0  . L-Lysine 1000 MG TABS Take 1,000 mg by mouth daily.     Marland Kitchen losartan (COZAAR) 100 MG tablet Take 100 mg by mouth daily.    . metFORMIN (GLUCOPHAGE) 1000 MG tablet Take 1,000 mg by mouth 2 (two) times daily with a meal.    . Multiple Vitamin (MULTIVITAMIN) tablet Take 1 tablet by mouth daily.    . pentoxifylline (TRENTAL) 400 MG CR tablet Take 1 tablet by mouth  twice a day 180 tablet 2  . potassium chloride SA (K-DUR,KLOR-CON) 20 MEQ tablet Take 20 mEq by mouth 2 (two) times daily.    Marland Kitchen rOPINIRole (REQUIP) 1 MG tablet Take 1 mg by mouth at  bedtime.    . simvastatin (ZOCOR) 40 MG tablet Take 40 mg by mouth every evening.    . sitaGLIPtin (JANUVIA) 100 MG tablet Take 100 mg by mouth daily.    Manus Gunning BOWEL PREP KIT 17.5-3.13-1.6 GM/180ML SOLN TAKE 1 KIT BY MOUTH ONCE.  0  . tiotropium (SPIRIVA HANDIHALER) 18 MCG inhalation capsule Place 1 capsule (18 mcg total) into inhaler and inhale daily. 90 capsule 3   Current Facility-Administered Medications  Medication Dose  Route Frequency Provider Last Rate Last Dose  . 0.9 %  sodium chloride infusion  500 mL Intravenous Continuous Milus Banister, MD        ROS: See HPI for pertinent positives and negatives.   Physical Examination  Vitals:   11/22/16 1527 11/22/16 1530  BP: (!) 111/57 (!) 144/74  Pulse: 61   Temp: 98.4 F (36.9 C)   SpO2: 94%   Weight: 264 lb (119.7 kg)   Height: '5\' 9"'  (1.753 m)    Body mass index is 38.99 kg/m.  General: A&O x 3, obese male, WDWN. Gait: normal Eyes: PERRLA. Pulmonary: Respirations are non labored at rest, moderately labored with putting on his socks and shoes, + rales in right base. Cardiac: regular rhythm, no detected murmur.     Carotid Bruits Right Left   Negative Negative  Aorta is not palpable. Radial pulses: 2+ palpable and =   VASCULAR EXAM: Extremities without ischemic changes, without Gangrene; without open wounds. Left great toenail and left second toenail surgically absent.     LE Pulses Right Left   FEMORAL  not palpable  palpable    POPLITEAL not palpable  not palpable   POSTERIOR TIBIAL not palpable  2+ palpable    DORSALIS PEDIS  ANTERIOR TIBIAL not palpable  not palpable    Abdomen: soft, NT, no palpable masses. Skin: no rashes, no ulcers. Musculoskeletal: no  muscle wasting or atrophy. Neurologic: A&O X 3; Appropriate Affect ; SENSATION: normal; MOTOR FUNCTION: moving all extremities equally, motor strength 5/5 throughout. Speech is fluent/normal.  CN 2-12 intact.    ASSESSMENT: Anthony [name] is a 70 y.o. male who is status post right external iliac, common femoral, superficial femoral endarterectomy with bovine pericardial patch angioplasty on 10/10/2014 for lifestyle limiting claudication which was not responsive to conservative treatment. He has no claudication since the above procedure. His walking is limited by dyspnea secondary to his COPD. He has no signs of ischemia in his lower extremities.   His atherosclerotic risk factors include mostly in control DM, former smoker, CAD, obesity, and OSA.     Dr. [name] spoke with and examined pt.  DATA  Today's right LE arterial duplex suggests patent right endarterectomy site of the right EIA, CFA, and proximal SFA with no signficant stenosis. 75-99% stenosis of the right proximal/mid SFA (increased form 50-74% stenosis on 04-19-16). Right mid to distal popliteal artery occlusion with distal collaterals, this is unchanged from 04-19-16.  Bilateral ABI's declined: right from 0.79 to 0.71 with monophasic waveforms, TBI 0.49; left declined from 0.93 to 0.81, mono and biphasic waveforms, TBI 0.63.   PLAN:  Based on the patient's vascular studies and examination, pt will be scheduled for an arteriogram with bilateral run off, possible intervention by Dr. [name], for as soon as possible, about 2 weeks, on 12-07-16.   I discussed in depth with the patient the nature of atherosclerosis, and emphasized the importance of maximal medical management including strict control of blood pressure, blood glucose, and lipid levels, obtaining regular exercise, and continued cessation of smoking.  The patient is aware that without maximal medical management the underlying atherosclerotic disease process  will progress, limiting the benefit of any interventions.  The patient was given information about PAD including signs, symptoms, treatment, what symptoms should prompt the patient to seek immediate medical care, and risk reduction measures to take.  Clemon Chambers, RN, MSN, FNP-C [facility] Phone: [phone]  Clinic MD: [name]  11/22/16 3:59 PM

## 2016-11-22 NOTE — Patient Instructions (Signed)

## 2016-11-23 ENCOUNTER — Other Ambulatory Visit: Payer: Self-pay | Admitting: *Deleted

## 2016-11-23 ENCOUNTER — Other Ambulatory Visit: Payer: Self-pay | Admitting: Pulmonary Disease

## 2016-11-23 ENCOUNTER — Other Ambulatory Visit (HOSPITAL_COMMUNITY): Payer: Self-pay | Admitting: Internal Medicine

## 2016-12-03 ENCOUNTER — Other Ambulatory Visit: Payer: Self-pay | Admitting: Surgery

## 2016-12-07 ENCOUNTER — Other Ambulatory Visit: Payer: Self-pay | Admitting: *Deleted

## 2016-12-07 ENCOUNTER — Encounter (HOSPITAL_COMMUNITY)
Admission: RE | Disposition: A | Discharge status: Home / Self Care | Payer: Self-pay | Source: Ambulatory Visit | Attending: Surgery

## 2016-12-07 ENCOUNTER — Ambulatory Visit (HOSPITAL_COMMUNITY)
Admission: RE | Admit: 2016-12-07 | Discharge: 2016-12-07 | Disposition: A | Discharge status: Home / Self Care | Payer: Medicare Other | Source: Ambulatory Visit | Attending: Surgery | Admitting: Surgery

## 2016-12-07 DIAGNOSIS — Z7984 Long term (current) use of oral hypoglycemic drugs: Secondary | ICD-10-CM | POA: Insufficient documentation

## 2016-12-07 DIAGNOSIS — I70211 Atherosclerosis of native arteries of extremities with intermittent claudication, right leg: Secondary | ICD-10-CM | POA: Insufficient documentation

## 2016-12-07 DIAGNOSIS — I739 Peripheral vascular disease, unspecified: Secondary | ICD-10-CM | POA: Diagnosis present

## 2016-12-07 DIAGNOSIS — E785 Hyperlipidemia, unspecified: Secondary | ICD-10-CM | POA: Diagnosis not present

## 2016-12-07 DIAGNOSIS — M48061 Spinal stenosis, lumbar region without neurogenic claudication: Secondary | ICD-10-CM | POA: Diagnosis not present

## 2016-12-07 DIAGNOSIS — Z6838 Body mass index (BMI) 38.0-38.9, adult: Secondary | ICD-10-CM | POA: Diagnosis not present

## 2016-12-07 DIAGNOSIS — Z955 Presence of coronary angioplasty implant and graft: Secondary | ICD-10-CM | POA: Insufficient documentation

## 2016-12-07 DIAGNOSIS — I509 Heart failure, unspecified: Secondary | ICD-10-CM | POA: Insufficient documentation

## 2016-12-07 DIAGNOSIS — J9611 Chronic respiratory failure with hypoxia: Secondary | ICD-10-CM | POA: Diagnosis not present

## 2016-12-07 DIAGNOSIS — Z981 Arthrodesis status: Secondary | ICD-10-CM | POA: Insufficient documentation

## 2016-12-07 DIAGNOSIS — I700 Atherosclerosis of aorta: Secondary | ICD-10-CM | POA: Insufficient documentation

## 2016-12-07 DIAGNOSIS — J449 Chronic obstructive pulmonary disease, unspecified: Secondary | ICD-10-CM | POA: Insufficient documentation

## 2016-12-07 DIAGNOSIS — M1990 Unspecified osteoarthritis, unspecified site: Secondary | ICD-10-CM | POA: Insufficient documentation

## 2016-12-07 DIAGNOSIS — G4733 Obstructive sleep apnea (adult) (pediatric): Secondary | ICD-10-CM | POA: Diagnosis not present

## 2016-12-07 DIAGNOSIS — I110 Hypertensive heart disease with heart failure: Secondary | ICD-10-CM | POA: Insufficient documentation

## 2016-12-07 DIAGNOSIS — Z7982 Long term (current) use of aspirin: Secondary | ICD-10-CM | POA: Insufficient documentation

## 2016-12-07 DIAGNOSIS — Z833 Family history of diabetes mellitus: Secondary | ICD-10-CM | POA: Diagnosis not present

## 2016-12-07 DIAGNOSIS — Z87891 Personal history of nicotine dependence: Secondary | ICD-10-CM | POA: Insufficient documentation

## 2016-12-07 DIAGNOSIS — I2510 Atherosclerotic heart disease of native coronary artery without angina pectoris: Secondary | ICD-10-CM | POA: Diagnosis not present

## 2016-12-07 DIAGNOSIS — E1151 Type 2 diabetes mellitus with diabetic peripheral angiopathy without gangrene: Secondary | ICD-10-CM | POA: Insufficient documentation

## 2016-12-07 DIAGNOSIS — E669 Obesity, unspecified: Secondary | ICD-10-CM | POA: Diagnosis not present

## 2016-12-07 DIAGNOSIS — Z8249 Family history of ischemic heart disease and other diseases of the circulatory system: Secondary | ICD-10-CM | POA: Insufficient documentation

## 2016-12-07 DIAGNOSIS — Z9862 Peripheral vascular angioplasty status: Secondary | ICD-10-CM

## 2016-12-07 HISTORY — PX: PERIPHERAL VASCULAR CATHETERIZATION: SHX172C

## 2016-12-07 LAB — POCT I-STAT, CHEM 8
BUN: 35 mg/dL — ABNORMAL HIGH (ref 6–20)
Calcium, Ion: 1.22 mmol/L (ref 1.15–1.40)
Chloride: 104 mmol/L (ref 101–111)
Creatinine, Ser: 1.4 mg/dL — ABNORMAL HIGH (ref 0.61–1.24)
Glucose, Bld: 245 mg/dL — ABNORMAL HIGH (ref 65–99)
HCT: 43 % (ref 39.0–52.0)
Hemoglobin: 14.6 g/dL (ref 13.0–17.0)
Potassium: 4.8 mmol/L (ref 3.5–5.1)
Sodium: 140 mmol/L (ref 135–145)
TCO2: 28 mmol/L (ref 0–100)

## 2016-12-07 LAB — GLUCOSE, CAPILLARY
Glucose-Capillary: 227 mg/dL — ABNORMAL HIGH (ref 65–99)
Glucose-Capillary: 227 mg/dL — ABNORMAL HIGH (ref 65–99)

## 2016-12-07 SURGERY — ABDOMINAL AORTOGRAM W/LOWER EXTREMITY
Anesthesia: LOCAL

## 2016-12-07 MED ORDER — HYDRALAZINE HCL 20 MG/ML IJ SOLN: 5.0000 mg | INTRAMUSCULAR | Status: DC | PRN

## 2016-12-07 MED ORDER — IODIXANOL 320 MG/ML IV SOLN
INTRAVENOUS | Status: DC | PRN
  Administered 2016-12-07: 112 mL via INTRA_ARTERIAL

## 2016-12-07 MED ORDER — MIDAZOLAM HCL 2 MG/2ML IJ SOLN
INTRAMUSCULAR | Status: AC
  Filled 2016-12-07: qty 2

## 2016-12-07 MED ORDER — ONDANSETRON HCL 4 MG/2ML IJ SOLN: 4.0000 mg | Freq: Four times a day (QID) | INTRAMUSCULAR | Status: DC | PRN

## 2016-12-07 MED ORDER — MORPHINE SULFATE (PF) 10 MG/ML IV SOLN: 2.0000 mg | INTRAVENOUS | Status: DC | PRN

## 2016-12-07 MED ORDER — GUAIFENESIN-DM 100-10 MG/5ML PO SYRP: 15.0000 mL | ORAL_SOLUTION | ORAL | Status: DC | PRN

## 2016-12-07 MED ORDER — SODIUM CHLORIDE 0.9 % IV SOLN
INTRAVENOUS | Status: DC
  Administered 2016-12-07: 10:00:00 via INTRAVENOUS

## 2016-12-07 MED ORDER — OXYCODONE HCL 5 MG PO TABS: 5.0000 mg | ORAL_TABLET | ORAL | Status: DC | PRN

## 2016-12-07 MED ORDER — PHENOL 1.4 % MT LIQD: 1.0000 | OROMUCOSAL | Status: DC | PRN

## 2016-12-07 MED ORDER — SODIUM CHLORIDE 0.9 % IV SOLN
1.0000 mL/kg/h | INTRAVENOUS | Status: DC
  Administered 2016-12-07: 1 mL/kg/h via INTRAVENOUS

## 2016-12-07 MED ORDER — FENTANYL CITRATE (PF) 100 MCG/2ML IJ SOLN
INTRAMUSCULAR | Status: AC
  Filled 2016-12-07: qty 2

## 2016-12-07 MED ORDER — FENTANYL CITRATE (PF) 100 MCG/2ML IJ SOLN
INTRAMUSCULAR | Status: DC | PRN
  Administered 2016-12-07: 50 ug via INTRAVENOUS

## 2016-12-07 MED ORDER — ACETAMINOPHEN 325 MG RE SUPP: 325.0000 mg | RECTAL | Status: DC | PRN

## 2016-12-07 MED ORDER — HEPARIN (PORCINE) IN NACL 2-0.9 UNIT/ML-% IJ SOLN
INTRAMUSCULAR | Status: DC | PRN
  Administered 2016-12-07: 1000 mL via INTRA_ARTERIAL

## 2016-12-07 MED ORDER — ALUM & MAG HYDROXIDE-SIMETH 200-200-20 MG/5ML PO SUSP: 15.0000 mL | ORAL | Status: DC | PRN

## 2016-12-07 MED ORDER — LABETALOL HCL 5 MG/ML IV SOLN: 10.0000 mg | INTRAVENOUS | Status: DC | PRN

## 2016-12-07 MED ORDER — ACETAMINOPHEN 325 MG PO TABS: 325.0000 mg | ORAL_TABLET | ORAL | Status: DC | PRN

## 2016-12-07 MED ORDER — LIDOCAINE HCL (PF) 1 % IJ SOLN
INTRAMUSCULAR | Status: DC | PRN
Start: 2016-12-07 — End: 2016-12-07
  Administered 2016-12-07: 12 mL

## 2016-12-07 MED ORDER — DOCUSATE SODIUM 100 MG PO CAPS: 100.0000 mg | ORAL_CAPSULE | Freq: Every day | ORAL | Status: DC

## 2016-12-07 MED ORDER — MIDAZOLAM HCL 2 MG/2ML IJ SOLN
INTRAMUSCULAR | Status: DC | PRN
  Administered 2016-12-07: 2 mg via INTRAVENOUS

## 2016-12-07 MED ORDER — LIDOCAINE HCL (PF) 1 % IJ SOLN
INTRAMUSCULAR | Status: AC
  Filled 2016-12-07: qty 30

## 2016-12-07 MED ORDER — HEPARIN (PORCINE) IN NACL 2-0.9 UNIT/ML-% IJ SOLN
INTRAMUSCULAR | Status: AC
  Filled 2016-12-07: qty 1000

## 2016-12-07 MED ORDER — METOPROLOL TARTRATE 5 MG/5ML IV SOLN: 2.0000 mg | INTRAVENOUS | Status: DC | PRN

## 2016-12-07 SURGICAL SUPPLY — 14 items
CATH ANGIO 5F BER2 65CM (CATHETERS) ×2 IMPLANT
CATH OMNI FLUSH 5F 65CM (CATHETERS) ×2 IMPLANT
CATH SOFT-VU 4F 65 STRAIGHT (CATHETERS) ×1 IMPLANT
CATH SOFT-VU STRAIGHT 4F 65CM (CATHETERS) ×1
COVER PRB 48X5XTLSCP FOLD TPE (BAG) ×1 IMPLANT
COVER PROBE 5X48 (BAG) ×1
DEVICE TORQUE .025-.038 (MISCELLANEOUS) ×2 IMPLANT
GUIDEWIRE ANGLED .035X150CM (WIRE) ×2 IMPLANT
KIT PV (KITS) ×2 IMPLANT
SHEATH PINNACLE 5F 10CM (SHEATH) ×2 IMPLANT
SYR MEDRAD MARK V 150ML (SYRINGE) ×2 IMPLANT
TRANSDUCER W/STOPCOCK (MISCELLANEOUS) ×2 IMPLANT
TRAY PV CATH (CUSTOM PROCEDURE TRAY) ×2 IMPLANT
WIRE BENTSON .035X145CM (WIRE) ×2 IMPLANT

## 2016-12-07 NOTE — Op Note (Signed)
    Patient name: Anthony [name] MRN: [id number] DOB: 11/18/1946 Sex: male  12/07/2016 Pre-operative Diagnosis: Right leg claudication Post-operative diagnosis:  Same Surgeon:  Annamarie Major Procedure Performed:  1.  Ultrasound-guided access, left femoral artery  2.  Abdominal aortogram  3.  Bilateral lower extremity runoff  4.  Second order catheterization  5.  Conscious sedation (72minutes)     Indications:  Patient has a history of a right femoral endarterectomy with patch and plasty for claudication.  His most recent ultrasound suggested possible recurrent disease.  Presented today for further evaluation.  Procedure:  The patient was identified in the holding area and taken to room 8.  The patient was then placed supine on the table and prepped and draped in the usual sterile fashion.  A time out was called.  Conscious sedation was performed with the slightest it'll and Versed in a continuous physician and nurse monitoring.  Heart rate and blood pressure and oxygen saturations were continuously monitored.  Ultrasound was used to evaluate the left common femoral artery.  It was patent .  A digital ultrasound image was acquired.  A micropuncture needle was used to access the left common femoral artery under ultrasound guidance.  An 018 wire was advanced without resistance and a micropuncture sheath was placed.  The 018 wire was removed and a benson wire was placed.  The micropuncture sheath was exchanged for a 5 french sheath.  An omniflush catheter was advanced over the wire to the level of L-1.  An abdominal angiogram was obtained.  Next, using the omniflush catheter and a benson wire, the aortic bifurcation was crossed and the catheter was placed into theright external iliac artery and right runoff was obtained.  left runoff was performed via retrograde sheath injections.  Findings:   Aortogram:  no significant renal artery stenosis.  The infrarenal abdominal aorta is highly calcified  obtained from its course.  The right common and external iliac artery are widely patent.  An exophytic plaque is located within the proximal left external iliac artery with hemodynamically significant stenosis.  There is also a lesion within the distal left common iliac artery which appears to be hemodynamically significant.  Right Lower Extremity:   the right common femoral and profunda femoral artery are widely patent.  The superficial femoral artery is patent proximally.  Large exophytic plaque is located in multiple areas throughout the superficial femoral artery with high-grade stenosis.  The popliteal artery is occluded with reconstitution of the below-knee popliteal artery and three-vessel runoff  Lower Extremity:  the left common femoral and superficial femoral artery are widely patent.  The profunda femoral artery is occluded.  The popliteal artery is widely patent with three-vessel runoff    Intervention:   None  Impression:  #1  right femoral endarterectomy site remains widely patent   #2  progression of the superficial femoral artery stenosis with continued occlusion of the popliteal artery  #3  hemodynamically significant stenosis within the left common and external iliac artery  #4  occluded left profunda femoral artery    V. Annamarie Major, M.D. [facility] [place] Office: [phone] Pager:  [phone]

## 2016-12-07 NOTE — Progress Notes (Signed)
5 FR femoral sheath pulled and manual pressure held x 15 minutes without complication. Pt given instructions and verbalized understanding. Dressing applied and site was a level zero.

## 2016-12-07 NOTE — Interval H&P Note (Signed)
History and Physical Interval Note:  12/07/2016 10:28 AM  Anthony [name]  has presented today for surgery, with the diagnosis of claudication  The various methods of treatment have been discussed with the patient and family. After consideration of risks, benefits and other options for treatment, the patient has consented to  Procedure(s): Abdominal Aortogram w/Lower Extremity (N/A) as a surgical intervention .  The patient's history has been reviewed, patient examined, no change in status, stable for surgery.  I have reviewed the patient's chart and labs.  Questions were answered to the patient's satisfaction.     Annamarie Major

## 2016-12-07 NOTE — H&P (View-Only) (Signed)
[facility] [place]   CC: Follow up peripheral artery occlusive disease  History of Present Illness Anthony [name] is a 70 y.o. male patient of Dr. [name] who returns today for follow up status post right external iliac, common femoral, superficial femoral endarterectomy with bovine pericardial patch angioplasty on 10/10/2014 for lifestyle limiting claudication which was not responsive to conservative treatment. His postoperative course was uncomplicated. He states that he is walking significantly better.He no longer gets cramping in his calf.  He was walking about 5 miles/week as confirmed by his fit bit, less more recently, states he is limited by his dyspnea.  He reports chronic lumbar and c-spine issues. He denies non healing wounds.   He denies any history of stroke or TIA. The patient denies New Medical or Surgical History.  Pt Diabetic: Yes, states in good control with an A1C of 7.1, increased from 6.7. Pt smoker: former smoker, quit in 1989  Pt meds include: Statin :Yes Betablocker: No ASA: Yes Other anticoagulants/antiplatelets: no     Past Medical History:  Diagnosis Date  . Arthritis   . CAD (coronary artery disease)    stent placed 06/02/2000  . Chronic respiratory failure with hypoxia ([place]) 08/11/2012  . Congestive heart failure ([place])   . COPD (chronic obstructive pulmonary disease) ([place]) 08/11/2012  . Diabetes mellitus ([place])   . Hyperlipidemia   . Hypertension   . Lumbar stenosis   . OSA (obstructive sleep apnea) 08/11/2012   cpcap  . Peripheral vascular disease ([place])   . Pneumonia 1999  . Shortness of breath    with exertion    Social History Social History  Substance Use Topics  . Smoking status: Former Smoker    Packs/day: 2.00    Years: 28.00    Types: Cigarettes    Quit date: 08/03/1987  . Smokeless tobacco: Never Used  . Alcohol use Yes     Comment: weekly    Family History Family History  Problem Relation Age  of Onset  . Heart disease Father 60  . Cancer Father   . Hyperlipidemia Father   . Hypertension Father   . Heart attack Father   . Cancer Mother   . Deep vein thrombosis Mother     Varicose veins  . Diabetes Mother   . Hyperlipidemia Mother   . Hypertension Mother     Past Surgical History:  Procedure Laterality Date  . ABDOMINAL ANGIOGRAM  Dec. 4, 2013  . ABDOMINAL AORTAGRAM N/A 11/29/2012   Procedure: ABDOMINAL Maxcine Ham;  Surgeon: Serafina Mitchell, MD;  Location[facility] CATH LAB;  Service: Cardiovascular;  Laterality: N/A;  . ABDOMINAL AORTAGRAM N/A 09/03/2014   Procedure: ABDOMINAL AORTAGRAM;  Surgeon: Serafina Mitchell, MD;  Location: [facility] CATH LAB;  Service: Cardiovascular;  Laterality: N/A;  . ANGIOPLASTY  06/02/2000   Stent  . ANTERIOR LATERAL LUMBAR FUSION 4 LEVELS Right 05/28/2014   Procedure: Right L4-5 L3-4 L2-3, L1-2  Anterior lateral lumbar fusion with percutaneaous pedicle screws. Lumbar four/five, three/four, two/three and possible two/one;  Surgeon: Erline Levine, MD;  Location: [facility] NEURO ORS;  Service: Neurosurgery;  Laterality: Right;  Lumbar One-Five Fusion with Percutaneous Screws  . [place]   right foot  . CARDIAC CATHETERIZATION    . CARPAL TUNNEL RELEASE  09/30/2006   right wrist  . CERVICAL FUSION  Nov 2002  . COLONOSCOPY W/ BIOPSIES AND POLYPECTOMY     benign  . ENDARTERECTOMY FEMORAL Right 10/10/2014   Procedure:  RIGHT FEMORAL ARTERY ENDARTERECTOMY  WITH VASCU GUARD PATCH ANGIOPLASTY;  Surgeon: Serafina Mitchell, MD;  Location: [facility];  Service: Vascular;  Laterality: Right;  . EYE SURGERY Bilateral 2014   cataracts  . HAMMER TOE SURGERY  June 2007 & August 2008   left foot  . HAND ARTHROPLASTY  Oct 2010   right thumb  . Heart catherization  03/26/1999  . [place] SURGERY  Dec 1997 & Ampril 2005  . LUMBAR PERCUTANEOUS PEDICLE SCREW 4 LEVEL N/A 05/28/2014   Procedure: LUMBAR PERCUTANEOUS PEDICLE SCREW 4 LEVEL;  Surgeon: Erline Levine, MD;  Location: [place]  NEURO ORS;  Service: Neurosurgery;  Laterality: N/A;  . [place]   right foot  . SPINE SURGERY  04/21/2004   Lower back disk gurgery- Lumbar stenosis  . TONSILLECTOMY  1965    No Known Allergies  Current Outpatient Prescriptions  Medication Sig Dispense Refill  . albuterol (PROAIR HFA) 108 (90 Base) MCG/ACT inhaler Inhale 2 puffs into the lungs every 6 (six) hours as needed for wheezing or shortness of breath. 3 Inhaler 3  . aspirin 325 MG tablet Take 325 mg by mouth daily.    . B Complex Vitamins (VITAMIN B COMPLEX PO) Take 1 tablet by mouth 2 (two) times daily.     . B Complex-Folic Acid (BENFOTIAMINE MULTI-B PO) Take 2 tablets by mouth daily.    . bisoprolol (ZEBETA) 5 MG tablet Take 1 tablet (5 mg total) by mouth daily. 90 tablet 2  . CINNAMON PO Take 2 tablets by mouth daily.    Marland Kitchen doxazosin (CARDURA) 2 MG tablet Take 2 mg by mouth daily.    Marland Kitchen doxycycline (ADOXA) 50 MG tablet Take 50 mg by mouth daily.    . fish oil-omega-3 fatty acids 1000 MG capsule Take 3 g by mouth daily.     . furosemide (LASIX) 20 MG tablet Take 20 mg by mouth 2 (two) times daily.     Marland Kitchen GARLIC PO Take 1 tablet by mouth daily.    Marland Kitchen glipiZIDE (GLUCOTROL) 10 MG tablet Take 10 mg by mouth daily before breakfast.    . isosorbide dinitrate (ISORDIL) 30 MG tablet     . isosorbide mononitrate (IMDUR) 30 MG 24 hr tablet TAKE 1 TABLET BY MOUTH  EVERY DAY 90 tablet 0  . L-Lysine 1000 MG TABS Take 1,000 mg by mouth daily.     Marland Kitchen losartan (COZAAR) 100 MG tablet Take 100 mg by mouth daily.    . metFORMIN (GLUCOPHAGE) 1000 MG tablet Take 1,000 mg by mouth 2 (two) times daily with a meal.    . Multiple Vitamin (MULTIVITAMIN) tablet Take 1 tablet by mouth daily.    . pentoxifylline (TRENTAL) 400 MG CR tablet Take 1 tablet by mouth  twice a day 180 tablet 2  . potassium chloride SA (K-DUR,KLOR-CON) 20 MEQ tablet Take 20 mEq by mouth 2 (two) times daily.    Marland Kitchen rOPINIRole (REQUIP) 1 MG tablet Take 1 mg by mouth at  bedtime.    . simvastatin (ZOCOR) 40 MG tablet Take 40 mg by mouth every evening.    . sitaGLIPtin (JANUVIA) 100 MG tablet Take 100 mg by mouth daily.    Manus Gunning BOWEL PREP KIT 17.5-3.13-1.6 GM/180ML SOLN TAKE 1 KIT BY MOUTH ONCE.  0  . tiotropium (SPIRIVA HANDIHALER) 18 MCG inhalation capsule Place 1 capsule (18 mcg total) into inhaler and inhale daily. 90 capsule 3   Current Facility-Administered Medications  Medication Dose  Route Frequency Provider Last Rate Last Dose  . 0.9 %  sodium chloride infusion  500 mL Intravenous Continuous Milus Banister, MD        ROS: See HPI for pertinent positives and negatives.   Physical Examination  Vitals:   11/22/16 1527 11/22/16 1530  BP: (!) 111/57 (!) 144/74  Pulse: 61   Temp: 98.4 F (36.9 C)   SpO2: 94%   Weight: 264 lb (119.7 kg)   Height: '5\' 9"'  (1.753 m)    Body mass index is 38.99 kg/m.  General: A&O x 3, obese male, WDWN. Gait: normal Eyes: PERRLA. Pulmonary: Respirations are non labored at rest, moderately labored with putting on his socks and shoes, + rales in right base. Cardiac: regular rhythm, no detected murmur.     Carotid Bruits Right Left   Negative Negative  Aorta is not palpable. Radial pulses: 2+ palpable and =   VASCULAR EXAM: Extremities without ischemic changes, without Gangrene; without open wounds. Left great toenail and left second toenail surgically absent.     LE Pulses Right Left   FEMORAL  not palpable  palpable    POPLITEAL not palpable  not palpable   POSTERIOR TIBIAL not palpable  2+ palpable    DORSALIS PEDIS  ANTERIOR TIBIAL not palpable  not palpable    Abdomen: soft, NT, no palpable masses. Skin: no rashes, no ulcers. Musculoskeletal: no  muscle wasting or atrophy. Neurologic: A&O X 3; Appropriate Affect ; SENSATION: normal; MOTOR FUNCTION: moving all extremities equally, motor strength 5/5 throughout. Speech is fluent/normal.  CN 2-12 intact.    ASSESSMENT: WENCESLAUS GIST is a 70 y.o. male who is status post right external iliac, common femoral, superficial femoral endarterectomy with bovine pericardial patch angioplasty on 10/10/2014 for lifestyle limiting claudication which was not responsive to conservative treatment. He has no claudication since the above procedure. His walking is limited by dyspnea secondary to his COPD. He has no signs of ischemia in his lower extremities.   His atherosclerotic risk factors include mostly in control DM, former smoker, CAD, obesity, and OSA.     Dr. [name] spoke with and examined pt.  DATA  Today's right LE arterial duplex suggests patent right endarterectomy site of the right EIA, CFA, and proximal SFA with no signficant stenosis. 75-99% stenosis of the right proximal/mid SFA (increased form 50-74% stenosis on 04-19-16). Right mid to distal popliteal artery occlusion with distal collaterals, this is unchanged from 04-19-16.  Bilateral ABI's declined: right from 0.79 to 0.71 with monophasic waveforms, TBI 0.49; left declined from 0.93 to 0.81, mono and biphasic waveforms, TBI 0.63.   PLAN:  Based on the patient's vascular studies and examination, pt will be scheduled for an arteriogram with bilateral run off, possible intervention by Dr. [name], for as soon as possible, about 2 weeks, on 12-07-16.   I discussed in depth with the patient the nature of atherosclerosis, and emphasized the importance of maximal medical management including strict control of blood pressure, blood glucose, and lipid levels, obtaining regular exercise, and continued cessation of smoking.  The patient is aware that without maximal medical management the underlying atherosclerotic disease process  will progress, limiting the benefit of any interventions.  The patient was given information about PAD including signs, symptoms, treatment, what symptoms should prompt the patient to seek immediate medical care, and risk reduction measures to take.  Clemon Chambers, RN, MSN, FNP-C [facility] Phone: [phone]  Clinic MD: [name]  11/22/16 3:59 PM

## 2016-12-07 NOTE — Discharge Instructions (Signed)
Angiogram, Care After °These instructions give you information about caring for yourself after your procedure. Your doctor may also give you more specific instructions. Call your doctor if you have any problems or questions after your procedure. °Follow these instructions at home: °· Take medicines only as told by your doctor. °· Follow your doctor's instructions about: °¨ Care of the area where the tube was inserted. °¨ Bandage (dressing) changes and removal. °· You may shower 24-48 hours after the procedure or as told by your doctor. °· Do not take baths, swim, or use a hot tub until your doctor approves. °· Every day, check the area where the tube was inserted. Watch for: °¨ Redness, swelling, or pain. °¨ Fluid, blood, or pus. °· Do not apply powder or lotion to the site. °· Do not lift anything that is heavier than 10 lb (4.5 kg) for 5 days or as told by your doctor. °· Ask your doctor when you can: °¨ Return to work or school. °¨ Do physical activities or play sports. °¨ Have sex. °· Do not drive or operate heavy machinery for 24 hours or as told by your doctor. °· Have someone with you for the first 24 hours after the procedure. °· Keep all follow-up visits as told by your doctor. This is important. °Contact a health care provider if: °· You have a fever. °· You have chills. °· You have more bleeding from the area where the tube was inserted. Hold pressure on the area. °· You have redness, swelling, or pain in the area where the tube was inserted. °· You have fluid or pus coming from the area. °Get help right away if: °· You have a lot of pain in the area where the tube was inserted. °· The area where the tube was inserted is bleeding, and the bleeding does not stop after 30 minutes of holding steady pressure on the area. °· The area near or just beyond the insertion site becomes pale, cool, tingly, or numb. °This information is not intended to replace advice given to you by your health care provider. Make  sure you discuss any questions you have with your health care provider. °Document Released: 03/11/2009 Document Revised: 05/20/2016 Document Reviewed: 05/16/2013 °Elsevier Interactive Patient Education © 2017 [employer] ° °

## 2016-12-08 ENCOUNTER — Encounter (HOSPITAL_COMMUNITY): Payer: Self-pay | Admitting: Surgery

## 2016-12-30 ENCOUNTER — Encounter: Payer: Self-pay | Admitting: Pulmonary Disease

## 2016-12-30 ENCOUNTER — Ambulatory Visit (INDEPENDENT_AMBULATORY_CARE_PROVIDER_SITE_OTHER): Payer: Medicare Other | Admitting: Pulmonary Disease

## 2016-12-30 VITALS — BP 128/78 | HR 59 | Ht 69.5 in | Wt 261.8 lb

## 2016-12-30 DIAGNOSIS — G4733 Obstructive sleep apnea (adult) (pediatric): Secondary | ICD-10-CM | POA: Diagnosis not present

## 2016-12-30 DIAGNOSIS — J42 Unspecified chronic bronchitis: Secondary | ICD-10-CM

## 2016-12-30 DIAGNOSIS — J986 Disorders of diaphragm: Secondary | ICD-10-CM

## 2016-12-30 NOTE — Patient Instructions (Signed)
Follow up in 1 year.

## 2016-12-30 NOTE — Progress Notes (Signed)
Current Outpatient Prescriptions on File Prior to Visit  Medication Sig  . albuterol (PROAIR HFA) 108 (90 Base) MCG/ACT inhaler Inhale 2 puffs into the lungs every 6 (six) hours as needed for wheezing or shortness of breath.  Marland Kitchen aspirin 325 MG tablet Take 325 mg by mouth at bedtime.   . B Complex Vitamins (VITAMIN B COMPLEX PO) Take 1 tablet by mouth daily.   . B Complex-Folic Acid (BENFOTIAMINE MULTI-B PO) Take 1 tablet by mouth 2 (two) times daily.   . bisoprolol (ZEBETA) 5 MG tablet TAKE 1 TABLET BY MOUTH  DAILY (Patient taking differently: Take 5mg s daily at night)  . CINNAMON PO Take 2,000 mg by mouth every evening.   Marland Kitchen doxazosin (CARDURA) 2 MG tablet Take 2 mg by mouth every evening.   Marland Kitchen doxycycline (ADOXA) 50 MG tablet Take 50 mg by mouth every evening.   . fish oil-omega-3 fatty acids 1000 MG capsule Take 2 g by mouth daily.   . furosemide (LASIX) 20 MG tablet Take 20 mg by mouth 2 (two) times daily.   Marland Kitchen GARLIC PO Take [id number] mg by mouth daily.   Marland Kitchen glipiZIDE (GLUCOTROL) 10 MG tablet Take 10 mg by mouth daily before breakfast.  . isosorbide mononitrate (IMDUR) 30 MG 24 hr tablet TAKE 1 TABLET BY MOUTH  EVERY DAY  . L-Lysine 1000 MG TABS Take 1,000 mg by mouth daily.   Marland Kitchen losartan (COZAAR) 100 MG tablet Take 100 mg by mouth daily.  . metFORMIN (GLUCOPHAGE) 1000 MG tablet Take 1,000 mg by mouth 2 (two) times daily with a meal.  . Multiple Vitamin (MULTIVITAMIN) tablet Take 1 tablet by mouth daily.  . pentoxifylline (TRENTAL) 400 MG CR tablet TAKE 1 TABLET BY MOUTH  TWICE A DAY  . potassium chloride SA (K-DUR,KLOR-CON) 20 MEQ tablet Take 20 mEq by mouth 2 (two) times daily.  Marland Kitchen rOPINIRole (REQUIP) 1 MG tablet Take 1 mg by mouth at bedtime.  . simvastatin (ZOCOR) 40 MG tablet Take 40 mg by mouth every evening.  . sitaGLIPtin (JANUVIA) 100 MG tablet Take 100 mg by mouth daily.  Marland Kitchen SPIRIVA HANDIHALER 18 MCG inhalation capsule INHALE THE CONTENTS OF 1  CAPSULE VIA HANDIHALER  DAILY   Current  Facility-Administered Medications on File Prior to Visit  Medication  . 0.9 %  sodium chloride infusion     Chief Complaint  Patient presents with  . Follow-up    Wears CPAP nightly. Denies problems with mask or pressure. DME: [facility]     Pulmonary tests PFT 08/13/12>>FEV1 1.83 (62%), FEV1% 64, TLC 5.17 (82%), DLCO 67%, +BD SNIFF test 08/14/12>>Paradoxical motion of the right hemidiaphragm with inspiration  Sleep tests  PSG 08/14/12>>AHI 38.5. CPAP 13 cm H2O with 1 liter oxygen ONO with CPAP and RA 01/18/13 >> test time 10 hrs 37 min. Mean SpO2 94%, low SpO2 86%. Spent 16 sec with SpO2 < 88% CPAP 11/28/15 to 12/27/15 >> used on 30 of 30 nights with average 8 hrs and 58 min.  Average AHI is 1 with CPAP 13 cm H2O.  Cardiac tests Echo 05/13/15 >> EF 45 to [id number], grade 2 diastolic dysfx, mod LVH, mod RV systolic dysfx  Past medical history CAD, HLD, combined CHF, PAD, HTN, DM, Lumbar stenosis  Past surgical history, Family history, Social history, Allergies reviewed  Vital Signs BP 128/78 (BP Location: Left Arm, Cuff Size: Normal)   Pulse (!) 59   Ht 5' 9.5" (1.765 m)   Wt 261 lb 12.8 oz (118.8  kg)   SpO2 95%   BMI 38.11 kg/m   History of Present Illness Anthony [name] is a 71 y.o. male with COPD, OSA, and rt diaphragm elevation.  He has been doing well.  Only time he has trouble with breathing is in cold weather, or going up stairs.  Not using proair.  He is not sure how spiriva is helping, but uses it every day.  No issues with CPAP.  He has trip to [place] later this month, and will be there for next three months.  Physical Exam  General - No distress ENT - No sinus tenderness, no oral exudate, no LAN Cardiac - s1s2 regular, no murmur Chest - No wheeze/rales/dullness Back - No focal tenderness Abd - Soft, non-tender Ext - No edema Neuro - Normal strength Skin - No rashes Psych - normal mood, and behavior   Assessment/Plan  COPD with chronic bronchitis. -  discussed that he could try coming off spiriva  - continue prn proair  OSA. - He is compliant with CPAP and reports benefit. - continue CPAP 13 cm H2O  Rt hemidiaphragm elevation. - monitor clinically   Patient Instructions  Follow up in 1 year    [name], MD [place] [facility] Pager:  [phone] 12/30/2016, 11:45 AM

## 2017-03-27 ENCOUNTER — Other Ambulatory Visit: Payer: Self-pay | Admitting: Pulmonary Disease

## 2017-04-15 ENCOUNTER — Ambulatory Visit (INDEPENDENT_AMBULATORY_CARE_PROVIDER_SITE_OTHER): Payer: Medicare Other | Admitting: Podiatry

## 2017-04-15 DIAGNOSIS — M79676 Pain in unspecified toe(s): Secondary | ICD-10-CM

## 2017-04-15 DIAGNOSIS — B351 Tinea unguium: Secondary | ICD-10-CM

## 2017-04-15 DIAGNOSIS — I739 Peripheral vascular disease, unspecified: Secondary | ICD-10-CM

## 2017-04-15 NOTE — Progress Notes (Signed)
Patient ID: Anthony [name], male   DOB: December 23, 1946, 71 y.o.   MRN: [id number] HPI  Complaint:  Visit Type: Patient returns to my office for continued preventative foot care services. Complaint: Patient states" my nails have grown long and thick and become painful to walk and wear shoes"  . This patient  presents for preventative foot care services. No changes to ROS  Podiatric Exam: Vascular: dorsalis pedis and posterior tibial pulses are absent. Capillary return is slow to refill. Temperature gradient is negative.  Sensorium: Normal Semmes Weinstein monofilament test. Normal tactile sensation bilaterally.  Nail Exam: Pt has thick disfigured discolored nails with subungual debris noted bilateral entire nail hallux through fifth toenails Ulcer Exam: There is no evidence of ulcer or pre-ulcerative changes or infection. Orthopedic Exam: Muscle tone and strength are WNL. No limitations in general ROM. No crepitus or effusions noted. Foot type and digits show no abnormalities. Bony prominences are unremarkable. Skin: No Porokeratosis. No infection or ulcers  Diagnosis:  Onychomycosis, Pain in right toe, pain in left toes  Treatment & Plan Procedures and Treatment: Consent by patient was obtained for treatment procedures. The patient understood the discussion of treatment and procedures well. All questions were answered thoroughly reviewed. Debridement of mycotic and hypertrophic toenails, 1 through 5 bilateral and clearing of subungual debris. No ulceration, no infection noted.  Return Visit-Office Procedure: Patient instructed to return to the office for a follow up visit 3 months for continued evaluation and treatment.   Gardiner Barefoot DPM

## 2017-05-25 ENCOUNTER — Other Ambulatory Visit: Payer: Self-pay | Admitting: Pulmonary Disease

## 2017-06-06 ENCOUNTER — Encounter: Payer: Self-pay | Admitting: Surgery

## 2017-06-13 ENCOUNTER — Encounter: Payer: Self-pay | Admitting: Surgery

## 2017-06-13 ENCOUNTER — Ambulatory Visit (INDEPENDENT_AMBULATORY_CARE_PROVIDER_SITE_OTHER)
Admission: RE | Admit: 2017-06-13 | Discharge: 2017-06-13 | Disposition: A | Discharge status: Home / Self Care | Payer: Medicare Other | Source: Ambulatory Visit | Attending: Surgery | Admitting: Surgery

## 2017-06-13 ENCOUNTER — Ambulatory Visit (INDEPENDENT_AMBULATORY_CARE_PROVIDER_SITE_OTHER): Payer: Medicare Other | Admitting: Surgery

## 2017-06-13 ENCOUNTER — Ambulatory Visit (HOSPITAL_COMMUNITY)
Admission: RE | Admit: 2017-06-13 | Discharge: 2017-06-13 | Disposition: A | Discharge status: Home / Self Care | Payer: Medicare Other | Source: Ambulatory Visit | Attending: Surgery | Admitting: Surgery

## 2017-06-13 VITALS — BP 116/71 | HR 63 | Temp 98.2°F | Resp 16 | Ht 69.5 in | Wt 255.5 lb

## 2017-06-13 DIAGNOSIS — Z9862 Peripheral vascular angioplasty status: Secondary | ICD-10-CM | POA: Diagnosis not present

## 2017-06-13 DIAGNOSIS — I739 Peripheral vascular disease, unspecified: Secondary | ICD-10-CM | POA: Insufficient documentation

## 2017-06-13 DIAGNOSIS — I771 Stricture of artery: Secondary | ICD-10-CM | POA: Insufficient documentation

## 2017-06-13 DIAGNOSIS — I70211 Atherosclerosis of native arteries of extremities with intermittent claudication, right leg: Secondary | ICD-10-CM | POA: Diagnosis not present

## 2017-06-13 NOTE — Progress Notes (Signed)
[facility] [place]  Patient name: Anthony [name] MRN: [id number] DOB: 18-Aug-1946 Sex: male   REASON FOR VISIT:    Follow up   HISOTRY OF PRESENT ILLNESS:    Anthony [name] is a 71 y.o. male who returns today for follow-up.  He is status post right external iliac, common femoral, superficial femoral, and profunda femoral endarterectomy with bovine pericardial patch and plasty on 10/10/2014.  This was done for lifestyle limiting claudication which failed conservative management.  His postoperative course was uncomplicated.  His quality of life has dramatically improved  In December 2017 he underwent angiography for possible stenosis.  Angiography revealed his intervention to be widely patent.  The patient suffers from diabetes.  His most recent hemoglobin A1c is 7.1.  He is a former smoker.  He takes a statin for hypercholesterolemia.  He is on an ARB for hypertension.   PAST MEDICAL HISTORY:   Past Medical History:  Diagnosis Date  . Arthritis   . CAD (coronary artery disease)    stent placed 06/02/2000  . Chronic respiratory failure with hypoxia ([place]) 08/11/2012  . Congestive heart failure ([place])   . COPD (chronic obstructive pulmonary disease) ([place]) 08/11/2012  . Diabetes mellitus ([place])   . Hyperlipidemia   . Hypertension   . Lumbar stenosis   . OSA (obstructive sleep apnea) 08/11/2012   cpcap  . Peripheral vascular disease ([place])   . Pneumonia 1999  . Shortness of breath    with exertion     FAMILY HISTORY:   Family History  Problem Relation Age of Onset  . Heart disease Father 108  . Cancer Father   . Hyperlipidemia Father   . Hypertension Father   . Heart attack Father   . Cancer Mother   . Deep vein thrombosis Mother        Varicose veins  . Diabetes Mother   . Hyperlipidemia Mother   . Hypertension Mother     SOCIAL HISTORY:   Social History  Substance Use Topics  . Smoking status: Former  Smoker    Packs/day: 2.00    Years: 28.00    Types: Cigarettes    Quit date: 08/03/1987  . Smokeless tobacco: Never Used  . Alcohol use Yes     Comment: weekly     ALLERGIES:   No Known Allergies   CURRENT MEDICATIONS:   Current Outpatient Prescriptions  Medication Sig Dispense Refill  . empagliflozin (JARDIANCE) 10 MG TABS tablet Take 10 mg by mouth daily.    Marland Kitchen aspirin 325 MG tablet Take 325 mg by mouth at bedtime.     . B Complex Vitamins (VITAMIN B COMPLEX PO) Take 1 tablet by mouth daily.     . B Complex-Folic Acid (BENFOTIAMINE MULTI-B PO) Take 1 tablet by mouth 2 (two) times daily.     . bisoprolol (ZEBETA) 5 MG tablet TAKE 1 TABLET BY MOUTH  DAILY (Patient taking differently: Take 5mg s daily at night) 90 tablet 3  . CINNAMON PO Take 2,000 mg by mouth every evening.     Marland Kitchen doxazosin (CARDURA) 2 MG tablet Take 2 mg by mouth every evening.     Marland Kitchen doxycycline (ADOXA) 50 MG tablet Take 50 mg by mouth every evening.     . fish oil-omega-3 fatty acids 1000 MG capsule Take 2 g by mouth daily.     . furosemide (LASIX) 20 MG tablet Take 20 mg by mouth 2 (two) times daily.     Marland Kitchen  GARLIC PO Take 5,573 mg by mouth daily.     Marland Kitchen glipiZIDE (GLUCOTROL) 10 MG tablet Take 10 mg by mouth daily before breakfast.    . isosorbide mononitrate (IMDUR) 30 MG 24 hr tablet TAKE 1 TABLET BY MOUTH  EVERY DAY 90 tablet 3  . L-Lysine 1000 MG TABS Take 1,000 mg by mouth daily.     Marland Kitchen losartan (COZAAR) 100 MG tablet Take 100 mg by mouth daily.    . metFORMIN (GLUCOPHAGE) 1000 MG tablet Take 1,000 mg by mouth 2 (two) times daily with a meal.    . Multiple Vitamin (MULTIVITAMIN) tablet Take 1 tablet by mouth daily.    . pentoxifylline (TRENTAL) 400 MG CR tablet TAKE 1 TABLET BY MOUTH  TWICE A DAY 180 tablet 3  . potassium chloride SA (K-DUR,KLOR-CON) 20 MEQ tablet Take 20 mEq by mouth 2 (two) times daily.    Marland Kitchen PROAIR HFA 108 (90 Base) MCG/ACT inhaler USE 2 PUFFS EVERY 6 HOURS  AS NEEDED FOR WHEEZING OR   SHORTNESS OF BREATH 1 Inhaler 2  . rOPINIRole (REQUIP) 1 MG tablet Take 1 mg by mouth at bedtime.    . simvastatin (ZOCOR) 40 MG tablet Take 40 mg by mouth every evening.    . sitaGLIPtin (JANUVIA) 100 MG tablet Take 100 mg by mouth daily.    Marland Kitchen SPIRIVA HANDIHALER 18 MCG inhalation capsule INHALE THE CONTENTS OF 1  CAPSULE VIA HANDIHALER  DAILY 90 capsule 1   Current Facility-Administered Medications  Medication Dose Route Frequency Provider Last Rate Last Dose  . 0.9 %  sodium chloride infusion  500 mL Intravenous Continuous Milus Banister, MD        REVIEW OF SYSTEMS:   [X]  denotes positive finding, [ ]  denotes negative finding Cardiac  Comments:  Chest pain or chest pressure:    Shortness of breath upon exertion: x   Short of breath when lying flat:    Irregular heart rhythm:        Vascular    Pain in calf, thigh, or hip brought on by ambulation: x   Pain in feet at night that wakes you up from your sleep:     Blood clot in your veins:    Leg swelling:         Pulmonary    Oxygen at home:    Productive cough:     Wheezing:         Neurologic    Sudden weakness in arms or legs:     Sudden numbness in arms or legs:     Sudden onset of difficulty speaking or slurred speech:    Temporary loss of vision in one eye:     Problems with dizziness:         Gastrointestinal    Blood in stool:     Vomited blood:         Genitourinary    Burning when urinating:     Blood in urine:        Psychiatric    Major depression:         Hematologic    Bleeding problems:    Problems with blood clotting too easily:        Skin    Rashes or ulcers:        Constitutional    Fever or chills:      PHYSICAL EXAM:   Vitals:   06/13/17 1542  BP: 116/71  Pulse: 63  Resp: 16  Temp: 98.2 F (36.8 C)  TempSrc: Oral  SpO2: 96%  Weight: 255 lb 8 oz (115.9 kg)  Height: 5' 9.5" (1.765 m)    GENERAL: The patient is a well-nourished male, in no acute distress. The vital signs  are documented above. CARDIAC: There is a regular rate and rhythm.  VASCULAR: Nonpalpable pedal pulses PULMONARY: Non-labored respirations MUSCULOSKELETAL: There are no major deformities or cyanosis. NEUROLOGIC: No focal weakness or paresthesias are detected. SKIN: There are no ulcers or rashes noted. PSYCHIATRIC: The patient has a normal affect.  STUDIES:   Ultrasound studies today show widely patent right femoral endarterectomy with patch angioplasty.  There are elevated velocities in the right proximal/mid superficial femoral artery.  His ABI 0.63 on the right.  Previously was 0.71  MEDICAL ISSUES:   The patient's patch angioplasty on the right remains widely patent.  His symptoms remain stable.  He is scheduled for follow-up in 1 year.    Annamarie Major, MD [facility] [phone] Pager [phone]

## 2017-06-21 NOTE — Addendum Note (Signed)
Addended by: Lianne Cure A on: 06/21/2017 03:53 PM   Modules accepted: Orders

## 2017-07-22 ENCOUNTER — Encounter: Payer: Self-pay | Admitting: Podiatry

## 2017-07-22 ENCOUNTER — Ambulatory Visit (INDEPENDENT_AMBULATORY_CARE_PROVIDER_SITE_OTHER): Payer: Medicare Other | Admitting: Podiatry

## 2017-07-22 DIAGNOSIS — B351 Tinea unguium: Secondary | ICD-10-CM | POA: Diagnosis not present

## 2017-07-22 DIAGNOSIS — I739 Peripheral vascular disease, unspecified: Secondary | ICD-10-CM

## 2017-07-22 DIAGNOSIS — M79676 Pain in unspecified toe(s): Secondary | ICD-10-CM | POA: Diagnosis not present

## 2017-07-22 NOTE — Progress Notes (Signed)
Patient ID: Anthony [name], male   DOB: [birth year], 71 y.o.   MRN: [id number] HPI  Complaint:  Visit Type: Patient returns to my office for continued preventative foot care services. Complaint: Patient states" my nails have grown long and thick and become painful to walk and wear shoes"  . This patient  presents for preventative foot care services. No changes to ROS  Podiatric Exam: Vascular: dorsalis pedis and posterior tibial pulses are absent. Capillary return is slow to refill. Temperature gradient is negative.  Sensorium: Normal Semmes Weinstein monofilament test. Normal tactile sensation bilaterally.  Nail Exam: Pt has thick disfigured discolored nails with subungual debris noted bilateral entire nail hallux through fifth toenails Ulcer Exam: There is no evidence of ulcer or pre-ulcerative changes or infection. Orthopedic Exam: Muscle tone and strength are WNL. No limitations in general ROM. No crepitus or effusions noted. Foot type and digits show no abnormalities. Bony prominences are unremarkable. Hammer toe second left. Skin: No Porokeratosis. No infection or ulcers  Diagnosis:  Onychomycosis, Pain in right toe, pain in left toes  Treatment & Plan Procedures and Treatment: Consent by patient was obtained for treatment procedures. The patient understood the discussion of treatment and procedures well. All questions were answered thoroughly reviewed. Debridement of mycotic and hypertrophic toenails, 1 through 5 bilateral and clearing of subungual debris. No ulceration, no infection noted. Buddy tape 2,3 left foot due to hammering second toe left. Return Visit-Office Procedure: Patient instructed to return to the office for a follow up visit 3 months for continued evaluation and treatment.   Gardiner Barefoot DPM

## 2017-09-25 ENCOUNTER — Other Ambulatory Visit: Payer: Self-pay | Admitting: Vascular Surgery

## 2017-09-25 ENCOUNTER — Other Ambulatory Visit (HOSPITAL_COMMUNITY): Payer: Self-pay | Admitting: Internal Medicine

## 2017-10-11 ENCOUNTER — Ambulatory Visit (INDEPENDENT_AMBULATORY_CARE_PROVIDER_SITE_OTHER): Payer: Medicare Other

## 2017-10-11 ENCOUNTER — Encounter (INDEPENDENT_AMBULATORY_CARE_PROVIDER_SITE_OTHER): Payer: Self-pay | Admitting: Orthopaedic Surgery

## 2017-10-11 ENCOUNTER — Ambulatory Visit (INDEPENDENT_AMBULATORY_CARE_PROVIDER_SITE_OTHER): Payer: Medicare Other | Admitting: Orthopaedic Surgery

## 2017-10-11 DIAGNOSIS — M170 Bilateral primary osteoarthritis of knee: Secondary | ICD-10-CM

## 2017-10-11 MED ORDER — LIDOCAINE HCL 1 % IJ SOLN
2.0000 mL | INTRAMUSCULAR | Status: AC | PRN
  Administered 2017-10-11: 2 mL

## 2017-10-11 MED ORDER — METHYLPREDNISOLONE ACETATE 40 MG/ML IJ SUSP
40.0000 mg | INTRAMUSCULAR | Status: AC | PRN
  Administered 2017-10-11: 40 mg via INTRA_ARTICULAR

## 2017-10-11 MED ORDER — BUPIVACAINE HCL 0.5 % IJ SOLN
2.0000 mL | INTRAMUSCULAR | Status: AC | PRN
  Administered 2017-10-11: 2 mL via INTRA_ARTICULAR

## 2017-10-11 NOTE — Progress Notes (Signed)
Office Visit Note   Patient: Anthony [name]           Date of Birth: [birth year]           MRN: [id number] Visit Date: 10/11/2017              Requested by: [name], [address] [place], [place] [address] PCP: [name], MD   Assessment & Plan: Visit Diagnoses:  1. Bilateral primary osteoarthritis of knee     Plan: Overall impression is bilateral knee osteoarthritis exacerbation. Patient does not have any significant degenerative changes. He does have diabetes for which she is on 3 different medicines. I would like to only do one injection today and then possibly do another one in a couple weeks or so I've asked him to monitor her sugars closely.  Questions encouraged and answered.  Follow-Up Instructions: Return if symptoms worsen or fail to improve.   Orders:  Orders Placed This Encounter  Procedures  . XR KNEE 3 VIEW RIGHT  . XR KNEE 3 VIEW LEFT   No orders of the defined types were placed in this encounter.     Procedures: Large Joint Inj Date/Time: 10/11/2017 10:58 AM Performed by: Leandrew Koyanagi Authorized by: Leandrew Koyanagi   Consent Given by:  Patient Timeout: prior to procedure the correct patient, procedure, and site was verified   Indications:  Pain Location:  Knee Site:  R knee Prep: patient was prepped and draped in usual sterile fashion   Needle Size:  22 G Approach:  Lateral Ultrasound Guidance: No   Fluoroscopic Guidance: No   Arthrogram: No   Medications:  2 mL bupivacaine 0.5 %; 40 mg methylPREDNISolone acetate 40 MG/ML; 2 mL lidocaine 1 % Patient tolerance:  Patient tolerated the procedure well with no immediate complications     Clinical Data: No additional findings.   Subjective: Chief Complaint  Patient presents with  . Right Knee - Pain  . Left Knee - Pain    Patient is a 71 year old gentleman who comes in with 1-2 month history of bilateral knee pain worse with activity. This is gradually gotten worse after he traveled  abroad for vacation. The pain is worse in the morning and he endorses start up stiffness. He denies any giving way or swelling. Denies any numbness and tingling or injuries. The pain does not radiate. It's a constant throbbing aching pain.    Review of Systems  Constitutional: Negative.   All other systems reviewed and are negative.    Objective: Vital Signs: There were no vitals taken for this visit.  Physical Exam  Constitutional: He is oriented to person, place, and time. He appears well-developed and well-nourished.  HENT:  Head: Normocephalic and atraumatic.  Eyes: Pupils are equal, round, and reactive to light.  Neck: Neck supple.  Pulmonary/Chest: Effort normal.  Abdominal: Soft.  Musculoskeletal: Normal range of motion.  Neurological: He is alert and oriented to person, place, and time.  Skin: Skin is warm.  Psychiatric: He has a normal mood and affect. His behavior is normal. Judgment and thought content normal.  Nursing note and vitals reviewed.   Ortho Exam Bilateral knee exam shows no joint effusion. Collaterals and cruciates are grossly stable. Mild patellar crepitus. Specialty Comments:  No specialty comments available.  Imaging: Xr Knee 3 View Left  Result Date: 10/11/2017 Mild osteoarthritis no significant degenerative changes  Xr Knee 3 View Right  Result Date: 10/11/2017 Mild osteoarthritis. No significant degenerative changes  PMFS History: Patient Active Problem List   Diagnosis Date Noted  . Bilateral primary osteoarthritis of knee 10/11/2017  . Chronic diastolic heart failure ([place]) 05/06/2015  . Femoral artery stenosis, right ([place]) 10/10/2014  . Preoperative clearance 09/09/2014  . Lumbar spine scoliosis 05/28/2014  . Pain in limb 12/10/2013  . Chest tightness 09/27/2013  . HTN (hypertension) 09/27/2013  . Peripheral vascular disease, unspecified ([place]) 01/08/2013  . PAD (peripheral artery disease) ([place]) 11/13/2012  . Atherosclerosis  of native arteries of the extremities with intermittent claudication 11/13/2012  . Bilateral claudication of lower limb ([place]) 10/09/2012  . Coronary atherosclerosis of native coronary artery 08/26/2012  . COPD (chronic obstructive pulmonary disease) ([place]) 08/11/2012  . OSA (obstructive sleep apnea) 08/11/2012  . Diaphragm paralysis 08/11/2012  . Morbid obesity with BMI of 40.0-44.9, adult ([place]) 08/11/2012   Past Medical History:  Diagnosis Date  . Arthritis   . CAD (coronary artery disease)    stent placed 06/02/2000  . Chronic respiratory failure with hypoxia ([place]) 08/11/2012  . Congestive heart failure ([place])   . COPD (chronic obstructive pulmonary disease) ([place]) 08/11/2012  . Diabetes mellitus ([place])   . Hyperlipidemia   . Hypertension   . Lumbar stenosis   . OSA (obstructive sleep apnea) 08/11/2012   cpcap  . Peripheral vascular disease ([place])   . Pneumonia 1999  . Shortness of breath    with exertion    Family History  Problem Relation Age of Onset  . Heart disease Father 49  . Cancer Father   . Hyperlipidemia Father   . Hypertension Father   . Heart attack Father   . Cancer Mother   . Deep vein thrombosis Mother        Varicose veins  . Diabetes Mother   . Hyperlipidemia Mother   . Hypertension Mother     Past Surgical History:  Procedure Laterality Date  . ABDOMINAL ANGIOGRAM  Dec. 4, 2013  . ABDOMINAL AORTAGRAM N/A 11/29/2012   Procedure: ABDOMINAL Maxcine Ham;  Surgeon: Serafina Mitchell, MD;  Location: [facility] C[facility] LAB;  Service: Cardiovascular;  Laterality: N/A;  . ABDOMINAL AORTAGRAM N/A 09/03/2014   Procedure: ABDOMINAL AORTAGRAM;  Surgeon: Serafina Mitchell, MD;  Location[facility] CATH LAB;  Service: Cardiovascular;  Laterality: N/A;  . ANGIOPLASTY  06/02/2000   Stent  . ANTERIOR LATERAL LUMBAR FUSION 4 LEVELS Right 05/28/2014   Procedure: Right L4-5 L3-4 L2-3, L1-2  Anterior lateral lumbar fusion with percutaneaous pedicle screws. Lumbar four/five, three/four, two/three and  possible two/one;  Surgeon: Erline Levine, MD;  Location: [facility] NEURO ORS;  Service: Neurosurgery;  Laterality: Right;  Lumbar One-Five Fusion with Percutaneous Screws  . [place]   right foot  . CARDIAC CATHETERIZATION    . CARPAL TUNNEL RELEASE  09/30/2006   right wrist  . CERVICAL FUSION  Nov 2002  . COLONOSCOPY W/ BIOPSIES AND POLYPECTOMY     benign  . ENDARTERECTOMY FEMORAL Right 10/10/2014   Procedure: RIGHT FEMORAL ARTERY ENDARTERECTOMY  WITH VASCU GUARD PATCH ANGIOPLASTY;  Surgeon: Serafina Mitchell, MD;  Location: [facility];  Service: Vascular;  Laterality: Right;  . EYE SURGERY Bilateral 2014   cataracts  . HAMMER TOE SURGERY  June 2007 & August 2008   left foot  . HAND ARTHROPLASTY  Oct 2010   right thumb  . Heart catherization  03/26/1999  . [place] SURGERY  Dec 1997 & Ampril 2005  . LUMBAR PERCUTANEOUS PEDICLE SCREW 4 LEVEL N/A 05/28/2014  Procedure: LUMBAR PERCUTANEOUS PEDICLE SCREW 4 LEVEL;  Surgeon: Erline Levine, MD;  Location: [place] [facility] NEURO ORS;  Service: Neurosurgery;  Laterality: N/A;  . [place]   right foot  . PERIPHERAL VASCULAR CATHETERIZATION N/A 12/07/2016   Procedure: Abdominal Aortogram w/Lower Extremity;  Surgeon: Serafina Mitchell, MD;  Location: [facility] CV LAB;  Service: Cardiovascular;  Laterality: N/A;  . SPINE SURGERY  04/21/2004   Lower back disk gurgery- Lumbar stenosis  . TONSILLECTOMY  1965   Social History   Occupational History  . retired    Social History Main Topics  . Smoking status: Former Smoker    Packs/day: 2.00    Years: 28.00    Types: Cigarettes    Quit date: 08/03/1987  . Smokeless tobacco: Never Used  . Alcohol use Yes     Comment: weekly  . Drug use: No  . Sexual activity: Not on file

## 2017-10-19 ENCOUNTER — Other Ambulatory Visit: Payer: Self-pay | Admitting: Pulmonary Disease

## 2017-10-21 ENCOUNTER — Ambulatory Visit: Payer: Medicare Other | Admitting: Podiatry

## 2017-10-25 ENCOUNTER — Ambulatory Visit (INDEPENDENT_AMBULATORY_CARE_PROVIDER_SITE_OTHER): Payer: Medicare Other | Admitting: Orthopaedic Surgery

## 2017-10-25 DIAGNOSIS — M1712 Unilateral primary osteoarthritis, left knee: Secondary | ICD-10-CM

## 2017-10-25 DIAGNOSIS — M1711 Unilateral primary osteoarthritis, right knee: Secondary | ICD-10-CM

## 2017-10-25 DIAGNOSIS — M170 Bilateral primary osteoarthritis of knee: Secondary | ICD-10-CM

## 2017-10-25 MED ORDER — LIDOCAINE HCL 1 % IJ SOLN
2.0000 mL | INTRAMUSCULAR | Status: AC | PRN
  Administered 2017-10-25: 2 mL

## 2017-10-25 MED ORDER — METHYLPREDNISOLONE ACETATE 40 MG/ML IJ SUSP
40.0000 mg | INTRAMUSCULAR | Status: AC | PRN
  Administered 2017-10-25: 40 mg via INTRA_ARTICULAR

## 2017-10-25 MED ORDER — BUPIVACAINE HCL 0.5 % IJ SOLN
2.0000 mL | INTRAMUSCULAR | Status: AC | PRN
  Administered 2017-10-25: 2 mL via INTRA_ARTICULAR

## 2017-10-25 NOTE — Progress Notes (Signed)
Office Visit Note   Patient: Anthony [name]           Date of Birth: [birth year]           MRN: [id number] Visit Date: 10/25/2017              Requested by: [name], [address], [place] [address] PCP: [name], MD   Assessment & Plan: Visit Diagnoses:  1. Bilateral primary osteoarthritis of knee     Plan: Left knee was injected with cortisone today.  Patient tolerated well.  prescription for Rollator.  Follow-up as needed.  Follow-Up Instructions: Return if symptoms worsen or fail to improve.   Orders:  No orders of the defined types were placed in this encounter.  No orders of the defined types were placed in this encounter.     Procedures: Large Joint Inj Date/Time: 10/25/2017 8:37 AM Performed by: Leandrew Koyanagi Authorized by: Leandrew Koyanagi   Consent Given by:  Patient Timeout: prior to procedure the correct patient, procedure, and site was verified   Indications:  Pain Location:  Knee Site:  L knee Prep: patient was prepped and draped in usual sterile fashion   Needle Size:  22 G Ultrasound Guidance: No   Fluoroscopic Guidance: No   Arthrogram: No   Medications:  2 mL lidocaine 1 %; 2 mL bupivacaine 0.5 %; 40 mg methylPREDNISolone acetate 40 MG/ML Patient tolerance:  Patient tolerated the procedure well with no immediate complications     Clinical Data: No additional findings.   Subjective: No chief complaint on file.   [name] today for his left knee arthritis.  It is feeling slightly better still the neck.  His right knee is feeling a whole lot better from his previous injection.  Is also requesting a prescription for rolling walker    Review of Systems   Objective: Vital Signs: There were no vitals taken for this visit.  Physical Exam  Ortho Exam Left knee exam is stable. Specialty Comments:  No specialty comments available.  Imaging: No results found.   PMFS History: Patient Active Problem List   Diagnosis Date Noted  . Bilateral primary osteoarthritis of knee 10/11/2017  . Chronic diastolic heart failure ([place]) 05/06/2015  . Femoral artery stenosis, right ([place]) 10/10/2014  . Preoperative clearance 09/09/2014  . Lumbar spine scoliosis 05/28/2014  . Pain in limb 12/10/2013  . Chest tightness 09/27/2013  . HTN (hypertension) 09/27/2013  . Peripheral vascular disease, unspecified ([place]) 01/08/2013  . PAD (peripheral artery disease) ([place]) 11/13/2012  . Atherosclerosis of native arteries of the extremities with intermittent claudication 11/13/2012  . Bilateral claudication of lower limb ([place]) 10/09/2012  . Coronary atherosclerosis of native coronary artery 08/26/2012  . COPD (chronic obstructive pulmonary disease) ([place]) 08/11/2012  . OSA (obstructive sleep apnea) 08/11/2012  . Diaphragm paralysis 08/11/2012  . Morbid obesity with BMI of 40.0-44.9, adult ([place]) 08/11/2012   Past Medical History:  Diagnosis Date  . Arthritis   . CAD (coronary artery disease)    stent placed 06/02/2000  . Chronic respiratory failure with hypoxia ([place]) 08/11/2012  . Congestive heart failure ([place])   . COPD (chronic obstructive pulmonary disease) ([place]) 08/11/2012  . Diabetes mellitus ([place])   . Hyperlipidemia   . Hypertension   . Lumbar stenosis   . OSA (obstructive sleep apnea) 08/11/2012   cpcap  . Peripheral vascular disease ([place])   . Pneumonia 1999  . Shortness of breath    with exertion  Family History  Problem Relation Age of Onset  . Heart disease Father 71  . Cancer Father   . Hyperlipidemia Father   . Hypertension Father   . Heart attack Father   . Cancer Mother   . Deep vein thrombosis Mother        Varicose veins  . Diabetes Mother   . Hyperlipidemia Mother   . Hypertension Mother     Past Surgical History:  Procedure Laterality Date  . ABDOMINAL ANGIOGRAM  Dec. 4, 2013  . ABDOMINAL AORTAGRAM N/A 11/29/2012   Procedure: ABDOMINAL Maxcine Ham;  Surgeon: Serafina Mitchell, MD;   Location: [facility] CATH LAB;  Service: Cardiovascular;  Laterality: N/A;  . ABDOMINAL AORTAGRAM N/A 09/03/2014   Procedure: ABDOMINAL AORTAGRAM;  Surgeon: Serafina Mitchell, MD;  Location: [facility] CATH LAB;  Service: Cardiovascular;  Laterality: N/A;  . ANGIOPLASTY  06/02/2000   Stent  . ANTERIOR LATERAL LUMBAR FUSION 4 LEVELS Right 05/28/2014   Procedure: Right L4-5 L3-4 L2-3, L1-2  Anterior lateral lumbar fusion with percutaneaous pedicle screws. Lumbar four/five, three/four, two/three and possible two/one;  Surgeon: Erline Levine, MD;  Location: [facility] NEURO ORS;  Service: Neurosurgery;  Laterality: Right;  Lumbar One-Five Fusion with Percutaneous Screws  . [place]   right foot  . CARDIAC CATHETERIZATION    . CARPAL TUNNEL RELEASE  09/30/2006   right wrist  . CERVICAL FUSION  Nov 2002  . COLONOSCOPY W/ BIOPSIES AND POLYPECTOMY     benign  . ENDARTERECTOMY FEMORAL Right 10/10/2014   Procedure: RIGHT FEMORAL ARTERY ENDARTERECTOMY  WITH VASCU GUARD PATCH ANGIOPLASTY;  Surgeon: Serafina Mitchell, MD;  Location: [facility];  Service: Vascular;  Laterality: Right;  . EYE SURGERY Bilateral 2014   cataracts  . HAMMER TOE SURGERY  June 2007 & August 2008   left foot  . HAND ARTHROPLASTY  Oct 2010   right thumb  . Heart catherization  03/26/1999  . [place] SURGERY  Dec 1997 & Ampril 2005  . LUMBAR PERCUTANEOUS PEDICLE SCREW 4 LEVEL N/A 05/28/2014   Procedure: LUMBAR PERCUTANEOUS PEDICLE SCREW 4 LEVEL;  Surgeon: Erline Levine, MD;  Location: [facility] NEURO ORS;  Service: Neurosurgery;  Laterality: N/A;  . [place]   right foot  . PERIPHERAL VASCULAR CATHETERIZATION N/A 12/07/2016   Procedure: Abdominal Aortogram w/Lower Extremity;  Surgeon: Serafina Mitchell, MD;  Location: [facility] CV LAB;  Service: Cardiovascular;  Laterality: N/A;  . SPINE SURGERY  04/21/2004   Lower back disk gurgery- Lumbar stenosis  . TONSILLECTOMY  1965   Social History   Occupational History  . retired    Social  History Main Topics  . Smoking status: Former Smoker    Packs/day: 2.00    Years: 28.00    Types: Cigarettes    Quit date: 08/03/1987  . Smokeless tobacco: Never Used  . Alcohol use Yes     Comment: weekly  . Drug use: No  . Sexual activity: Not on file

## 2017-11-08 ENCOUNTER — Other Ambulatory Visit (HOSPITAL_COMMUNITY): Payer: Self-pay | Admitting: Internal Medicine

## 2017-11-30 ENCOUNTER — Ambulatory Visit (INDEPENDENT_AMBULATORY_CARE_PROVIDER_SITE_OTHER): Payer: Medicare Other | Admitting: Pulmonary Disease

## 2017-11-30 ENCOUNTER — Encounter: Payer: Self-pay | Admitting: Pulmonary Disease

## 2017-11-30 VITALS — BP 116/62 | HR 71 | Ht 69.0 in | Wt 249.8 lb

## 2017-11-30 DIAGNOSIS — G4733 Obstructive sleep apnea (adult) (pediatric): Secondary | ICD-10-CM

## 2017-11-30 DIAGNOSIS — J310 Chronic rhinitis: Secondary | ICD-10-CM | POA: Diagnosis not present

## 2017-11-30 DIAGNOSIS — J986 Disorders of diaphragm: Secondary | ICD-10-CM

## 2017-11-30 DIAGNOSIS — J42 Unspecified chronic bronchitis: Secondary | ICD-10-CM

## 2017-11-30 NOTE — Patient Instructions (Signed)
Can try using nasal irrigation or flonase to help with sinus congestion  Call if you want to try dropping your CPAP setting down if your sinus congestion gets worse  Follow up in 1 year

## 2017-11-30 NOTE — Progress Notes (Signed)
Current Outpatient Medications on File Prior to Visit  Medication Sig  . aspirin 325 MG tablet Take 325 mg by mouth at bedtime.   Marland Kitchen b complex vitamins tablet Take 1 tablet by mouth daily.  . B Complex-Folic Acid (BENFOTIAMINE MULTI-B PO) Take 1 tablet by mouth 2 (two) times daily.   . bisoprolol (ZEBETA) 5 MG tablet Take 1 tablet (5 mg total) daily by mouth. Needs office visit  . CINNAMON PO Take 2,000 mg by mouth every evening.   Marland Kitchen doxazosin (CARDURA) 2 MG tablet Take 2 mg by mouth every evening.   Marland Kitchen doxycycline (ADOXA) 50 MG tablet Take 50 mg by mouth every evening.   . empagliflozin (JARDIANCE) 10 MG TABS tablet Take 10 mg by mouth daily.  . fish oil-omega-3 fatty acids 1000 MG capsule Take 2 g by mouth daily.   . furosemide (LASIX) 20 MG tablet Take 20 mg by mouth 2 (two) times daily.   Marland Kitchen GARLIC PO Take 6,269 mg by mouth daily.   Marland Kitchen glipiZIDE (GLUCOTROL) 10 MG tablet Take 10 mg by mouth daily before breakfast.  . isosorbide mononitrate (IMDUR) 30 MG 24 hr tablet Take 1 tablet (30 mg total) daily by mouth. Needs office visit  . L-Lysine 1000 MG TABS Take 1,000 mg by mouth daily.   Marland Kitchen losartan (COZAAR) 100 MG tablet Take 100 mg by mouth daily.  . metFORMIN (GLUCOPHAGE) 1000 MG tablet Take 1,000 mg by mouth 2 (two) times daily with a meal.  . Multiple Vitamin (MULTIVITAMIN) tablet Take 1 tablet by mouth daily.  . pentoxifylline (TRENTAL) 400 MG CR tablet TAKE 1 TABLET BY MOUTH  TWICE A DAY  . potassium chloride SA (K-DUR,KLOR-CON) 20 MEQ tablet Take 20 mEq by mouth 2 (two) times daily.  Marland Kitchen PROAIR HFA 108 (90 Base) MCG/ACT inhaler USE 2 PUFFS EVERY 6 HOURS  AS NEEDED FOR WHEEZING OR  SHORTNESS OF BREATH  . rOPINIRole (REQUIP) 1 MG tablet Take 1 mg by mouth at bedtime.  . simvastatin (ZOCOR) 40 MG tablet Take 40 mg by mouth every evening.  . sitaGLIPtin (JANUVIA) 100 MG tablet Take 100 mg by mouth daily.  Marland Kitchen SPIRIVA HANDIHALER 18 MCG inhalation capsule INHALE THE CONTENTS OF 1  CAPSULE VIA  HANDIHALER  DAILY   Current Facility-Administered Medications on File Prior to Visit  Medication  . 0.9 %  sodium chloride infusion     Chief Complaint  Patient presents with  . Follow-up    feels he is more sob, wants new handicapp sticker, feels his voice is rough     Pulmonary tests PFT 08/13/12>>FEV1 1.83 (62%), FEV1% 64, TLC 5.17 (82%), DLCO 67%, +BD SNIFF test 08/14/12>>Paradoxical motion of the right hemidiaphragm with inspiration  Sleep tests  PSG 08/14/12>>AHI 38.5. CPAP 13 cm H2O with 1 liter oxygen ONO with CPAP and RA 01/18/13 >> test time 10 hrs 37 min. Mean SpO2 94%, low SpO2 86%. Spent 16 sec with SpO2 < 88% CPAP 09/01/17 to 11/29/17 >> used on 90 of 90 nights with average 8 hrs 56 min.  Average AHI 0.9 with CPAP 13 cm H2O  Cardiac tests Echo 05/13/15 >> EF 45 to 48%, grade 2 diastolic dysfx, mod LVH, mod RV systolic dysfx  Past medical history CAD, HLD, combined CHF, PAD, HTN, DM, Lumbar stenosis  Past surgical history, Family history, Social history, Allergies reviewed  Vital Signs BP 116/62 (BP Location: Left Arm, Cuff Size: Normal)   Pulse 71   Ht 5\' 9"  (1.753 m)  Wt 249 lb 12.8 oz (113.3 kg)   SpO2 95%   BMI 36.89 kg/m   History of Present Illness Anthony [name] is a 71 y.o. male with COPD, OSA, and rt diaphragm elevation.  He gets winded if he has to walk to far.  He gets sinus congestion.  He has used flonase and nasal irrigation, but not on regular basis.  No issues with CPAP mask.  Uses CPAP nightly.  Denies cough, wheeze, sputum, hemoptysis, fever, chest pain, or leg swelling.  Physical Exam  General - pleasant Eyes - pupils reactive ENT - no sinus tenderness, no oral exudate, no LAN Cardiac - regular, no murmur Chest - no wheeze, rales Abd - soft, non tender Ext - no edema Skin - no rashes Neuro - normal strength Psych - normal mood   Assessment/Plan  COPD with chronic bronchitis. - continue spiriva  OSA. - he is compliant  with CPAP and reports benefit - continue CPAP 13 cm H2O  CPAP rhinitis. - advised him to try nasal irrigation and flonase on regular basis for now - could also consider dropping CPAP setting if symptoms persist  Rt hemidiaphragm elevation. - monitor clinically - completed handicap parking form   Patient Instructions  Can try using nasal irrigation or flonase to help with sinus congestion  Call if you want to try dropping your CPAP setting down if your sinus congestion gets worse  Follow up in 1 year   [name], MD [place] Pager:  [phone] 11/30/2017, 12:07 PM

## 2017-12-08 ENCOUNTER — Other Ambulatory Visit (HOSPITAL_COMMUNITY): Payer: Self-pay

## 2017-12-08 MED ORDER — ISOSORBIDE MONONITRATE ER 30 MG PO TB24: 30.0000 mg | ORAL_TABLET | Freq: Every day | ORAL | 3 refills | Status: DC

## 2017-12-08 MED ORDER — BISOPROLOL FUMARATE 5 MG PO TABS: 5.0000 mg | ORAL_TABLET | Freq: Every day | ORAL | 3 refills | Status: DC

## 2017-12-22 ENCOUNTER — Ambulatory Visit (INDEPENDENT_AMBULATORY_CARE_PROVIDER_SITE_OTHER): Payer: Medicare Other | Admitting: Orthopaedic Surgery

## 2017-12-22 DIAGNOSIS — M1712 Unilateral primary osteoarthritis, left knee: Secondary | ICD-10-CM

## 2017-12-22 MED ORDER — BUPIVACAINE HCL 0.5 % IJ SOLN
2.0000 mL | INTRAMUSCULAR | Status: AC | PRN
  Administered 2017-12-22: 2 mL via INTRA_ARTICULAR

## 2017-12-22 MED ORDER — LIDOCAINE HCL 1 % IJ SOLN
2.0000 mL | INTRAMUSCULAR | Status: AC | PRN
  Administered 2017-12-22: 2 mL

## 2017-12-22 MED ORDER — METHYLPREDNISOLONE ACETATE 40 MG/ML IJ SUSP
40.0000 mg | INTRAMUSCULAR | Status: AC | PRN
  Administered 2017-12-22: 40 mg via INTRA_ARTICULAR

## 2017-12-22 NOTE — Progress Notes (Signed)
Office Visit Note   Patient: Anthony [name]           Date of Birth: 06-07-1946           MRN: [id number] Visit Date: 12/22/2017              Requested by: [name], [address] [place], [place] [address] PCP: [name], MD   Assessment & Plan: Visit Diagnoses:  1. Unilateral primary osteoarthritis, left knee     Plan: Impression is left knee osteoarthritis.  Repeat cortisone injection was performed today.  Questions encouraged and answered.  Follow-up as needed.  Patient will let us know if he wants [place] to submit request for HA injections.  Follow-Up Instructions: Return if symptoms worsen or fail to improve.   Orders:  No orders of the defined types were placed in this encounter.  No orders of the defined types were placed in this encounter.     Procedures: Large Joint Inj: L knee on 12/22/2017 8:11 PM Details: 22 G needle Medications: 2 mL bupivacaine 0.5 %; 2 mL lidocaine 1 %; 40 mg methylPREDNISolone acetate 40 MG/ML Outcome: tolerated well, no immediate complications Patient was prepped and draped in the usual sterile fashion.       Clinical Data: No additional findings.   Subjective: No chief complaint on file.   HPI Patient comes in today requesting another left knee injection.  His right knee injection worked really well but the left knee only worked for a couple weeks.  He would like to try another one.  Left knee exam is stable. Review of Systems   Objective: Vital Signs: There were no vitals taken for this visit.  Physical Exam  Ortho Exam Left knee exam is stable. Specialty Comments:  No specialty comments available.  Imaging: No results found.   PMFS History: Patient Active Problem List   Diagnosis Date Noted  . Bilateral primary osteoarthritis of knee 10/11/2017  . Chronic diastolic heart failure ([place]) 05/06/2015  . Femoral artery stenosis, right ([place]) 10/10/2014  . Preoperative clearance 09/09/2014  . Lumbar spine  scoliosis 05/28/2014  . Pain in limb 12/10/2013  . Chest tightness 09/27/2013  . HTN (hypertension) 09/27/2013  . Peripheral vascular disease, unspecified ([place]) 01/08/2013  . PAD (peripheral artery disease) ([place]) 11/13/2012  . Atherosclerosis of native arteries of the extremities with intermittent claudication 11/13/2012  . Bilateral claudication of lower limb ([place]) 10/09/2012  . Coronary atherosclerosis of native coronary artery 08/26/2012  . COPD (chronic obstructive pulmonary disease) ([place]) 08/11/2012  . OSA (obstructive sleep apnea) 08/11/2012  . Diaphragm paralysis 08/11/2012  . Morbid obesity with BMI of 40.0-44.9, adult ([place]) 08/11/2012   Past Medical History:  Diagnosis Date  . Arthritis   . CAD (coronary artery disease)    stent placed 06/02/2000  . Chronic respiratory failure with hypoxia ([place]) 08/11/2012  . Congestive heart failure ([place])   . COPD (chronic obstructive pulmonary disease) ([place]) 08/11/2012  . Diabetes mellitus ([place])   . Hyperlipidemia   . Hypertension   . Lumbar stenosis   . OSA (obstructive sleep apnea) 08/11/2012   cpcap  . Peripheral vascular disease ([place])   . Pneumonia 1999  . Shortness of breath    with exertion    Family History  Problem Relation Age of Onset  . Heart disease Father 51  . Cancer Father   . Hyperlipidemia Father   . Hypertension Father   . Heart attack Father   . Cancer Mother   .  Deep vein thrombosis Mother        Varicose veins  . Diabetes Mother   . Hyperlipidemia Mother   . Hypertension Mother     Past Surgical History:  Procedure Laterality Date  . ABDOMINAL ANGIOGRAM  Dec. 4, 2013  . ABDOMINAL AORTAGRAM N/A 11/29/2012   Procedure: ABDOMINAL Maxcine Ham;  Surgeon: Serafina Mitchell, MD;  Location: [facility] CATH LAB;  Service: Cardiovascular;  Laterality: N/A;  . ABDOMINAL AORTAGRAM N/A 09/03/2014   Procedure: ABDOMINAL AORTAGRAM;  Surgeon: Serafina Mitchell, MD;  Location: [facility] CATH LAB;  Service: Cardiovascular;  Laterality: N/A;  .  ANGIOPLASTY  06/02/2000   Stent  . ANTERIOR LATERAL LUMBAR FUSION 4 LEVELS Right 05/28/2014   Procedure: Right L4-5 L3-4 L2-3, L1-2  Anterior lateral lumbar fusion with percutaneaous pedicle screws. Lumbar four/five, three/four, two/three and possible two/one;  Surgeon: Erline Levine, MD;  Location: [place] NEURO ORS;  Service: Neurosurgery;  Laterality: Right;  Lumbar One-Five Fusion with Percutaneous Screws  . [place]   right foot  . CARDIAC CATHETERIZATION    . CARPAL TUNNEL RELEASE  09/30/2006   right wrist  . CERVICAL FUSION  Nov 2002  . COLONOSCOPY W/ BIOPSIES AND POLYPECTOMY     benign  . ENDARTERECTOMY FEMORAL Right 10/10/2014   Procedure: RIGHT FEMORAL ARTERY ENDARTERECTOMY  WITH VASCU GUARD PATCH ANGIOPLASTY;  Surgeon: Serafina Mitchell, MD;  Location: [facility];  Service: Vascular;  Laterality: Right;  . EYE SURGERY Bilateral 2014   cataracts  . HAMMER TOE SURGERY  June 2007 & August 2008   left foot  . HAND ARTHROPLASTY  Oct 2010   right thumb  . Heart catherization  03/26/1999  . [place] SURGERY  Dec 1997 & Ampril 2005  . LUMBAR PERCUTANEOUS PEDICLE SCREW 4 LEVEL N/A 05/28/2014   Procedure: LUMBAR PERCUTANEOUS PEDICLE SCREW 4 LEVEL;  Surgeon: Erline Levine, MD;  Location: [place] NEURO ORS;  Service: Neurosurgery;  Laterality: N/A;  . [place]   right foot  . PERIPHERAL VASCULAR CATHETERIZATION N/A 12/07/2016   Procedure: Abdominal Aortogram w/Lower Extremity;  Surgeon: Serafina Mitchell, MD;  Location: [facility] CV LAB;  Service: Cardiovascular;  Laterality: N/A;  . SPINE SURGERY  04/21/2004   Lower back disk gurgery- Lumbar stenosis  . TONSILLECTOMY  1965   Social History   Occupational History  . Occupation: retired  Tobacco Use  . Smoking status: Former Smoker    Packs/day: 2.00    Years: 28.00    Pack years: 56.00    Types: Cigarettes    Last attempt to quit: 08/03/1987    Years since quitting: 30.4  . Smokeless tobacco: Never Used  Substance and  Sexual Activity  . Alcohol use: Yes    Comment: weekly  . Drug use: No  . Sexual activity: Not on file

## 2018-02-14 ENCOUNTER — Telehealth (INDEPENDENT_AMBULATORY_CARE_PROVIDER_SITE_OTHER): Payer: Self-pay | Admitting: Orthopaedic Surgery

## 2018-02-14 NOTE — Telephone Encounter (Signed)
[facility] called needing diagnosis code for patient. Reference # [id number]   CB # (not a direct line) [phone]

## 2018-02-15 NOTE — Telephone Encounter (Signed)
Tried to talk to someone and it will not transfer me to a representative. I was on the phone for 7 minutes pushing options   Diagnosis Code:  M17.12

## 2018-04-28 ENCOUNTER — Encounter (INDEPENDENT_AMBULATORY_CARE_PROVIDER_SITE_OTHER): Payer: Self-pay | Admitting: Orthopaedic Surgery

## 2018-04-28 ENCOUNTER — Ambulatory Visit (INDEPENDENT_AMBULATORY_CARE_PROVIDER_SITE_OTHER): Payer: Medicare Other | Admitting: Orthopaedic Surgery

## 2018-04-28 DIAGNOSIS — M1712 Unilateral primary osteoarthritis, left knee: Secondary | ICD-10-CM | POA: Diagnosis not present

## 2018-04-28 NOTE — Progress Notes (Signed)
Office Visit Note   Patient: Anthony [name]           Date of Birth: 07/04/1946           MRN: [id number] Visit Date: 04/28/2018              Requested by: [name], [address] [place], [place] [address] PCP: [name], MD   Assessment & Plan: Visit Diagnoses:  1. Unilateral primary osteoarthritis, left knee     Plan: Impression is a 72 year old gentleman with left knee osteoarthritis.  We will submit his insurance for Synvisc injection today.  We will call the patient when this has been approved.  Follow-Up Instructions: Return if symptoms worsen or fail to improve.   Orders:  No orders of the defined types were placed in this encounter.  No orders of the defined types were placed in this encounter.     Procedures: No procedures performed   Clinical Data: No additional findings.   Subjective: Chief Complaint  Patient presents with  . Left Knee - Pain, Follow-up    Anthony [name] follow-up of his left knee pain.  He is overall doing well.  He is having occasional clicking symptoms and giving way symptoms.  Pains well.  He would like to try HA injections.   Review of Systems   Objective: Vital Signs: There were no vitals taken for this visit.  Physical Exam  Ortho Exam Left knee exam shows no joint effusion.  Good range of motion.  1+ patellofemoral crepitus. Specialty Comments:  No specialty comments available.  Imaging: No results found.   PMFS History: Patient Active Problem List   Diagnosis Date Noted  . Bilateral primary osteoarthritis of knee 10/11/2017  . Chronic diastolic heart failure ([place]) 05/06/2015  . Femoral artery stenosis, right ([place]) 10/10/2014  . Preoperative clearance 09/09/2014  . Lumbar spine scoliosis 05/28/2014  . Pain in limb 12/10/2013  . Chest tightness 09/27/2013  . HTN (hypertension) 09/27/2013  . Peripheral vascular disease, unspecified ([place]) 01/08/2013  . PAD (peripheral artery disease) ([place]) 11/13/2012    . Atherosclerosis of native arteries of the extremities with intermittent claudication 11/13/2012  . Bilateral claudication of lower limb ([place]) 10/09/2012  . Coronary atherosclerosis of native coronary artery 08/26/2012  . COPD (chronic obstructive pulmonary disease) ([place]) 08/11/2012  . OSA (obstructive sleep apnea) 08/11/2012  . Diaphragm paralysis 08/11/2012  . Morbid obesity with BMI of 40.0-44.9, adult ([place]) 08/11/2012   Past Medical History:  Diagnosis Date  . Arthritis   . CAD (coronary artery disease)    stent placed 06/02/2000  . Chronic respiratory failure with hypoxia ([place]) 08/11/2012  . Congestive heart failure ([place])   . COPD (chronic obstructive pulmonary disease) ([place]) 08/11/2012  . Diabetes mellitus ([place])   . Hyperlipidemia   . Hypertension   . Lumbar stenosis   . OSA (obstructive sleep apnea) 08/11/2012   cpcap  . Peripheral vascular disease ([place])   . Pneumonia 1999  . Shortness of breath    with exertion    Family History  Problem Relation Age of Onset  . Heart disease Father 47  . Cancer Father   . Hyperlipidemia Father   . Hypertension Father   . Heart attack Father   . Cancer Mother   . Deep vein thrombosis Mother        Varicose veins  . Diabetes Mother   . Hyperlipidemia Mother   . Hypertension Mother     Past Surgical History:  Procedure Laterality Date  . ABDOMINAL ANGIOGRAM  Dec. 4, 2013  . ABDOMINAL AORTAGRAM N/A 11/29/2012   Procedure: ABDOMINAL Maxcine Ham;  Surgeon: Serafina Mitchell, MD;  Location: [facility] CATH LAB;  Service: Cardiovascular;  Laterality: N/A;  . ABDOMINAL AORTAGRAM N/A 09/03/2014   Procedure: ABDOMINAL AORTAGRAM;  Surgeon: Serafina Mitchell, MD;  Location: [facility] CATH LAB;  Service: Cardiovascular;  Laterality: N/A;  . ANGIOPLASTY  06/02/2000   Stent  . ANTERIOR LATERAL LUMBAR FUSION 4 LEVELS Right 05/28/2014   Procedure: Right L4-5 L3-4 L2-3, L1-2  Anterior lateral lumbar fusion with percutaneaous pedicle screws. Lumbar four/five, three/four,  two/three and possible two/one;  Surgeon: Erline Levine, MD;  Location: [place] NEURO ORS;  Service: Neurosurgery;  Laterality: Right;  Lumbar One-Five Fusion with Percutaneous Screws  . [place]   right foot  . CARDIAC CATHETERIZATION    . CARPAL TUNNEL RELEASE  09/30/2006   right wrist  . CERVICAL FUSION  Nov 2002  . COLONOSCOPY W/ BIOPSIES AND POLYPECTOMY     benign  . ENDARTERECTOMY FEMORAL Right 10/10/2014   Procedure: RIGHT FEMORAL ARTERY ENDARTERECTOMY  WITH VASCU GUARD PATCH ANGIOPLASTY;  Surgeon: Serafina Mitchell, MD;  Location: [facility];  Service: Vascular;  Laterality: Right;  . EYE SURGERY Bilateral 2014   cataracts  . HAMMER TOE SURGERY  June 2007 & August 2008   left foot  . HAND ARTHROPLASTY  Oct 2010   right thumb  . Heart catherization  03/26/1999  . [place] SURGERY  Dec 1997 & Ampril 2005  . LUMBAR PERCUTANEOUS PEDICLE SCREW 4 LEVEL N/A 05/28/2014   Procedure: LUMBAR PERCUTANEOUS PEDICLE SCREW 4 LEVEL;  Surgeon: Erline Levine, MD;  Location: [place] NEURO ORS;  Service: Neurosurgery;  Laterality: N/A;  . [place]   right foot  . PERIPHERAL VASCULAR CATHETERIZATION N/A 12/07/2016   Procedure: Abdominal Aortogram w/Lower Extremity;  Surgeon: Serafina Mitchell, MD;  Location: [facility] CV LAB;  Service: Cardiovascular;  Laterality: N/A;  . SPINE SURGERY  04/21/2004   Lower back disk gurgery- Lumbar stenosis  . TONSILLECTOMY  1965   Social History   Occupational History  . Occupation: retired  Tobacco Use  . Smoking status: Former Smoker    Packs/day: 2.00    Years: 28.00    Pack years: 56.00    Types: Cigarettes    Last attempt to quit: 08/03/1987    Years since quitting: 30.7  . Smokeless tobacco: Never Used  Substance and Sexual Activity  . Alcohol use: Yes    Comment: weekly  . Drug use: No  . Sexual activity: Not on file

## 2018-05-01 ENCOUNTER — Encounter: Payer: Self-pay | Admitting: Podiatry

## 2018-05-01 ENCOUNTER — Ambulatory Visit (INDEPENDENT_AMBULATORY_CARE_PROVIDER_SITE_OTHER): Payer: Medicare Other | Admitting: Podiatry

## 2018-05-01 DIAGNOSIS — I739 Peripheral vascular disease, unspecified: Secondary | ICD-10-CM

## 2018-05-01 DIAGNOSIS — M79676 Pain in unspecified toe(s): Secondary | ICD-10-CM

## 2018-05-01 DIAGNOSIS — B351 Tinea unguium: Secondary | ICD-10-CM | POA: Diagnosis not present

## 2018-05-01 DIAGNOSIS — Q828 Other specified congenital malformations of skin: Secondary | ICD-10-CM | POA: Diagnosis not present

## 2018-05-01 NOTE — Progress Notes (Signed)
Subjective: 72 y.o. returns the office today for painful, elongated, thickened toenails which he cannot trim himselff. Denies any redness or drainage around the nails.  He also is a callus to the right foot.  I did have trimmed.  Denies any acute changes since last appointment and no new complaints today. Denies any systemic complaints such as fevers, chills, nausea, vomiting.   PCP: [name], MD  Objective: AAO 3, NAD DP/PT pulses palpable, CRT less than 3 seconds Nails hypertrophic, dystrophic, elongated, brittle, discolored 8. There is tenderness overlying the nails 1-5 on the right and 3 through 5 on the left. There is no surrounding erythema or drainage along the nail sites. Hyperkeratotic lesion right medial first MPJ.  Upon debridement there is no underlying ulceration, drainage or any clinical signs of infection noted today. No open lesions or pre-ulcerative lesions are identified. No other areas of tenderness bilateral lower extremities. No overlying edema, erythema, increased warmth. No pain with calf compression, swelling, warmth, erythema.  Assessment: Patient presents with symptomatic onychomycosis, Hyperkeratotic lesion Plan: -Treatment options including alternatives, risks, complications were discussed -Nails sharply debrided 8 without complication/bleeding. -Hyperkeratotic lesion sharply debrided x1 without any complications or bleeding -Discussed daily foot inspection. If there are any changes, to call the office immediately.  -Follow-up in 3 months or sooner if any problems are to arise. In the meantime, encouraged to call the office with any questions, concerns, changes symptoms.  Celesta Gentile, DPM

## 2018-05-04 ENCOUNTER — Ambulatory Visit (HOSPITAL_COMMUNITY)
Admission: RE | Admit: 2018-05-04 | Discharge: 2018-05-04 | Disposition: A | Discharge status: Home / Self Care | Payer: Medicare Other | Source: Ambulatory Visit | Attending: Internal Medicine | Admitting: Internal Medicine

## 2018-05-04 ENCOUNTER — Encounter (HOSPITAL_COMMUNITY): Payer: Self-pay | Admitting: Internal Medicine

## 2018-05-04 VITALS — BP 128/72 | HR 66 | Wt 255.8 lb

## 2018-05-04 DIAGNOSIS — Z9889 Other specified postprocedural states: Secondary | ICD-10-CM | POA: Diagnosis not present

## 2018-05-04 DIAGNOSIS — Z87891 Personal history of nicotine dependence: Secondary | ICD-10-CM | POA: Insufficient documentation

## 2018-05-04 DIAGNOSIS — I2510 Atherosclerotic heart disease of native coronary artery without angina pectoris: Secondary | ICD-10-CM | POA: Diagnosis not present

## 2018-05-04 DIAGNOSIS — G4733 Obstructive sleep apnea (adult) (pediatric): Secondary | ICD-10-CM | POA: Diagnosis not present

## 2018-05-04 DIAGNOSIS — I110 Hypertensive heart disease with heart failure: Secondary | ICD-10-CM | POA: Diagnosis not present

## 2018-05-04 DIAGNOSIS — I10 Essential (primary) hypertension: Secondary | ICD-10-CM | POA: Diagnosis not present

## 2018-05-04 DIAGNOSIS — Z833 Family history of diabetes mellitus: Secondary | ICD-10-CM | POA: Diagnosis not present

## 2018-05-04 DIAGNOSIS — Z7984 Long term (current) use of oral hypoglycemic drugs: Secondary | ICD-10-CM | POA: Insufficient documentation

## 2018-05-04 DIAGNOSIS — Z79899 Other long term (current) drug therapy: Secondary | ICD-10-CM | POA: Insufficient documentation

## 2018-05-04 DIAGNOSIS — Z8249 Family history of ischemic heart disease and other diseases of the circulatory system: Secondary | ICD-10-CM | POA: Insufficient documentation

## 2018-05-04 DIAGNOSIS — E1151 Type 2 diabetes mellitus with diabetic peripheral angiopathy without gangrene: Secondary | ICD-10-CM | POA: Insufficient documentation

## 2018-05-04 DIAGNOSIS — I5032 Chronic diastolic (congestive) heart failure: Secondary | ICD-10-CM | POA: Insufficient documentation

## 2018-05-04 DIAGNOSIS — M1990 Unspecified osteoarthritis, unspecified site: Secondary | ICD-10-CM | POA: Insufficient documentation

## 2018-05-04 DIAGNOSIS — Z7982 Long term (current) use of aspirin: Secondary | ICD-10-CM | POA: Insufficient documentation

## 2018-05-04 DIAGNOSIS — I739 Peripheral vascular disease, unspecified: Secondary | ICD-10-CM | POA: Diagnosis not present

## 2018-05-04 DIAGNOSIS — E785 Hyperlipidemia, unspecified: Secondary | ICD-10-CM | POA: Insufficient documentation

## 2018-05-04 DIAGNOSIS — M48061 Spinal stenosis, lumbar region without neurogenic claudication: Secondary | ICD-10-CM | POA: Diagnosis not present

## 2018-05-04 DIAGNOSIS — E669 Obesity, unspecified: Secondary | ICD-10-CM | POA: Insufficient documentation

## 2018-05-04 DIAGNOSIS — Z809 Family history of malignant neoplasm, unspecified: Secondary | ICD-10-CM | POA: Insufficient documentation

## 2018-05-04 DIAGNOSIS — J449 Chronic obstructive pulmonary disease, unspecified: Secondary | ICD-10-CM | POA: Diagnosis not present

## 2018-05-04 NOTE — Patient Instructions (Signed)
We will contact you in 1 year to schedule your next appointment and echocardiogram  

## 2018-05-04 NOTE — Progress Notes (Signed)
Patient ID: Anthony [name], male   DOB: [birth year], 72 y.o.   MRN: [id number]   CARDIOLOGY CLINIC NOTE  Patient ID: Anthony [name], male   DOB: 1946-01-21, 72 y.o.   MRN: [id number]  PCP: [name], MD HF: Dr [name]    HPI: Anthony [name] is a 72 y.o. male with h/o obesity, HTN, HL, severe osteoarthritis, DM2 (diagnosed 2009), OSA (intolerant of CPAP), former smoker (quit 1989), CAD s/p stent 2001 and PAD. He is the father-in-law of Dr. [name] in Interventional Radiology.    Had cath in [place] in 2001. Was told he had 70% blockage in one artery and the one in the back was totally blocked. Eventually underwent stenting of that arter. No caths since. Myoview 10/14: Normal. LV Ejection Fraction: 56%. LV Wall Motion: NL LV Function; NL Wall Motion  Has severe PAD R>L followed by Dr. [name]. Now status post right external iliac, common femoral, superficial femoral endarterectomy with bovine pericardial patch angioplasty on 10/10/2014  Saw Dr. Halford Chessman and placed on home O2 but now weaned off with inhalers. Checks sats with pulse oximeter  Today he returns for follow up. Last seen 09/2016. Overall doing well. He went to [place] in the fall and spent the winter in [place]. He is SOB with walking long distances. No SOB while grocery shopping. Taking CBD oil for claudication and has seen some improvement. Able to do most things he wants to do without too much limitation. Also having knee pain and getting shots. No CP, rare dizziness. No orthopnea/PND/edema. Uses CPAP qHS. Weights ~250 lbs. No extra lasix. Compliant with meds.   PFTs with moderate COPD FEV1 1.83 (62%) FVC   2.84 (66%) FEF 25-75% 0.79 (29%) DLCO 67  ECHO 10/13 EF 50-55%  ECHO 5/16: EF 45-50% mold to moderate RV dysfunction Grade II DD  CXR with elevated R hemidiaphragm. Sleep study with severe OS - AHI 39.    Past Medical History:  Diagnosis Date  . Arthritis   . CAD (coronary artery disease)    stent placed 06/02/2000    . Chronic respiratory failure with hypoxia ([place]) 08/11/2012  . Congestive heart failure ([place])   . COPD (chronic obstructive pulmonary disease) ([place]) 08/11/2012  . Diabetes mellitus ([place])   . Hyperlipidemia   . Hypertension   . Lumbar stenosis   . OSA (obstructive sleep apnea) 08/11/2012   cpcap  . Peripheral vascular disease ([place])   . Pneumonia 1999  . Shortness of breath    with exertion    Current Outpatient Medications  Medication Sig Dispense Refill  . aspirin 325 MG tablet Take 325 mg by mouth at bedtime.     Marland Kitchen b complex vitamins tablet Take 1 tablet by mouth daily.    . B Complex-Folic Acid (BENFOTIAMINE MULTI-B PO) Take 1 tablet by mouth 2 (two) times daily.     . bisoprolol (ZEBETA) 5 MG tablet Take 1 tablet (5 mg total) by mouth daily. 90 tablet 3  . CINNAMON PO Take 2,000 mg by mouth every evening.     Marland Kitchen doxazosin (CARDURA) 2 MG tablet Take 2 mg by mouth every evening.     Marland Kitchen doxycycline (ADOXA) 50 MG tablet Take 50 mg by mouth every evening.     . empagliflozin (JARDIANCE) 10 MG TABS tablet Take 10 mg by mouth daily.    . fish oil-omega-3 fatty acids 1000 MG capsule Take 2 g by mouth daily.     . furosemide (LASIX) 20 MG  tablet Take 20 mg by mouth 2 (two) times daily.     Marland Kitchen GARLIC PO Take 0,539 mg by mouth daily.     Marland Kitchen glipiZIDE (GLUCOTROL) 10 MG tablet Take 10 mg by mouth daily before breakfast.    . isosorbide mononitrate (IMDUR) 30 MG 24 hr tablet Take 1 tablet (30 mg total) by mouth daily. 90 tablet 3  . L-Lysine 1000 MG TABS Take 1,000 mg by mouth daily.     Marland Kitchen losartan (COZAAR) 100 MG tablet Take 100 mg by mouth daily.    . metFORMIN (GLUCOPHAGE) 1000 MG tablet Take 1,000 mg by mouth 2 (two) times daily with a meal.    . Multiple Vitamin (MULTIVITAMIN) tablet Take 1 tablet by mouth daily.    . pentoxifylline (TRENTAL) 400 MG CR tablet TAKE 1 TABLET BY MOUTH  TWICE A DAY 180 tablet 2  . potassium chloride SA (K-DUR,KLOR-CON) 20 MEQ tablet Take 20 mEq by mouth 2 (two)  times daily.    Marland Kitchen PROAIR HFA 108 (90 Base) MCG/ACT inhaler USE 2 PUFFS EVERY 6 HOURS  AS NEEDED FOR WHEEZING OR  SHORTNESS OF BREATH 1 Inhaler 2  . rOPINIRole (REQUIP) 1 MG tablet Take 1 mg by mouth at bedtime.    . simvastatin (ZOCOR) 40 MG tablet Take 40 mg by mouth every evening.    . sitaGLIPtin (JANUVIA) 100 MG tablet Take 100 mg by mouth daily.    Marland Kitchen SPIRIVA HANDIHALER 18 MCG inhalation capsule INHALE THE CONTENTS OF 1  CAPSULE VIA HANDIHALER  DAILY 90 capsule 3   Current Facility-Administered Medications  Medication Dose Route Frequency Provider Last Rate Last Dose  . 0.9 %  sodium chloride infusion  500 mL Intravenous Continuous Milus Banister, MD         No Known Allergies  Social History   Socioeconomic History  . Marital status: Married    Spouse name: Not on file  . Number of children: Not on file  . Years of education: Not on file  . Highest education level: Not on file  Occupational History  . Occupation: retired  Scientific laboratory technician  . Financial resource strain: Not on file  . Food insecurity:    Worry: Not on file    Inability: Not on file  . Transportation needs:    Medical: Not on file    Non-medical: Not on file  Tobacco Use  . Smoking status: Former Smoker    Packs/day: 2.00    Years: 28.00    Pack years: 56.00    Types: Cigarettes    Last attempt to quit: 08/03/1987    Years since quitting: 30.7  . Smokeless tobacco: Never Used  Substance and Sexual Activity  . Alcohol use: Yes    Comment: weekly  . Drug use: No  . Sexual activity: Not on file  Lifestyle  . Physical activity:    Days per week: Not on file    Minutes per session: Not on file  . Stress: Not on file  Relationships  . Social connections:    Talks on phone: Not on file    Gets together: Not on file    Attends religious service: Not on file    Active member of club or organization: Not on file    Attends meetings of clubs or organizations: Not on file    Relationship status: Not on  file  . Intimate partner violence:    Fear of current or ex partner: Not on file  Emotionally abused: Not on file    Physically abused: Not on file    Forced sexual activity: Not on file  Other Topics Concern  . Not on file  Social History Narrative  . Not on file    Family History  Problem Relation Age of Onset  . Heart disease Father 63  . Cancer Father   . Hyperlipidemia Father   . Hypertension Father   . Heart attack Father   . Cancer Mother   . Deep vein thrombosis Mother        Varicose veins  . Diabetes Mother   . Hyperlipidemia Mother   . Hypertension Mother     PHYSICAL EXAM: Vitals:   05/04/18 1347  BP: 128/72  Pulse: 66  SpO2: 98%  Weight: 255 lb 12.8 oz (116 kg)   Wt Readings from Last 3 Encounters:  05/04/18 255 lb 12.8 oz (116 kg)  11/30/17 249 lb 12.8 oz (113.3 kg)  06/13/17 255 lb 8 oz (115.9 kg)    General: Well appearing. No resp difficulty. HEENT: Normal anicteric  Neck: Supple. JVP 7-8. Carotids 2+ bilat; no bruits. No thyromegaly or nodule noted. Cor: PMI nondisplaced. RRR, No M/G/R noted Lungs: CTAB, normal effort. Abdomen: Obese Soft, non-tender, non-distended, no HSM. No bruits or masses. +BS  Extremities: No cyanosis, clubbing, or rash. R and LLE trace edema with vascular changes Neuro: alert & oriented x 3, cranial nerves grossly intact. moves all 4 extremities w/o difficulty. Affect pleasant    EKG: NSR 70 with low voltage No ST-T wave abnormalities. Personally reviewed    ASSESSMENT & PLAN:  1) CAD- Doing well with no evidence of angina. Last cath in [place] with stent. Myoview 2014 normal EF and LV wall motion . On Statin, BB, + aspirin. Low threshold to repeat cath if any ischemic symptoms. - Encouraged exercise and weight loss 2) Chronic Diastolic HF-  EF 98-11% with mild RV dysfunction on ECHO 5/16. - NYHA III, Volume status stable - Continue lasix 20 mg BID - Repeat Echo at follow up 3) PAD- Claudication  improved s/p femoral artery endarterectomy in 2015. Followed by [facility]. Per Dr [name] 4) HTN- Well controlled 5) OSA on CPAP- Continue nightly.  6)  Hyperlipidemia - Followed by Dr. Virgina Jock.  7) COPD - Follows with Dr Halford Chessman  Follow up in 1 year with echo  Glori Bickers, MD 2:14 PM  Patient seen and examined with the above-signed Advanced Practice Provider and/or Housestaff. I personally reviewed laboratory data, imaging studies and relevant notes. I independently examined the patient and formulated the important aspects of the plan. I have edited the note to reflect any of my changes or salient points. I have personally discussed the plan with the patient and/or family.  Overall doing quite well. BP and lipids well controlled by Dr. Virgina Jock. No evidence of angina. Follows with [facility] for his PAD. Stressed need to be more active and try to get his weight down.   Glori Bickers, MD  6:06 PM

## 2018-06-01 ENCOUNTER — Telehealth (INDEPENDENT_AMBULATORY_CARE_PROVIDER_SITE_OTHER): Payer: Self-pay | Admitting: Orthopaedic Surgery

## 2018-06-01 NOTE — Telephone Encounter (Signed)
Noted  

## 2018-06-01 NOTE — Telephone Encounter (Signed)
Please submit gel inj per Dr Erlinda Hong  "We will submit his insurance for Synvisc injection today.  We will call the patient when this has been approved."

## 2018-06-01 NOTE — Telephone Encounter (Signed)
Patient called wanting to see if he could be approved for the HA injections so we can go on ahead and schedule that. CB # [phone]

## 2018-06-02 ENCOUNTER — Telehealth (INDEPENDENT_AMBULATORY_CARE_PROVIDER_SITE_OTHER): Payer: Self-pay

## 2018-06-02 NOTE — Telephone Encounter (Signed)
Submitted application online for SynviscOne, left knee. 

## 2018-06-19 ENCOUNTER — Ambulatory Visit (HOSPITAL_COMMUNITY)
Admission: RE | Admit: 2018-06-19 | Discharge: 2018-06-19 | Disposition: A | Discharge status: Home / Self Care | Payer: Medicare Other | Source: Ambulatory Visit | Attending: Surgery | Admitting: Surgery

## 2018-06-19 ENCOUNTER — Encounter: Payer: Self-pay | Admitting: Surgery

## 2018-06-19 ENCOUNTER — Ambulatory Visit (INDEPENDENT_AMBULATORY_CARE_PROVIDER_SITE_OTHER): Payer: Medicare Other | Admitting: Surgery

## 2018-06-19 VITALS — BP 96/56 | HR 64 | Temp 97.1°F | Ht 69.0 in | Wt 247.0 lb

## 2018-06-19 DIAGNOSIS — Z87891 Personal history of nicotine dependence: Secondary | ICD-10-CM | POA: Insufficient documentation

## 2018-06-19 DIAGNOSIS — I70211 Atherosclerosis of native arteries of extremities with intermittent claudication, right leg: Secondary | ICD-10-CM | POA: Diagnosis not present

## 2018-06-19 DIAGNOSIS — I70203 Unspecified atherosclerosis of native arteries of extremities, bilateral legs: Secondary | ICD-10-CM | POA: Diagnosis not present

## 2018-06-19 DIAGNOSIS — I2510 Atherosclerotic heart disease of native coronary artery without angina pectoris: Secondary | ICD-10-CM | POA: Insufficient documentation

## 2018-06-19 DIAGNOSIS — Z9889 Other specified postprocedural states: Secondary | ICD-10-CM | POA: Insufficient documentation

## 2018-06-19 DIAGNOSIS — I739 Peripheral vascular disease, unspecified: Secondary | ICD-10-CM

## 2018-06-19 DIAGNOSIS — I70213 Atherosclerosis of native arteries of extremities with intermittent claudication, bilateral legs: Secondary | ICD-10-CM | POA: Insufficient documentation

## 2018-06-19 DIAGNOSIS — E119 Type 2 diabetes mellitus without complications: Secondary | ICD-10-CM | POA: Diagnosis not present

## 2018-06-19 DIAGNOSIS — E785 Hyperlipidemia, unspecified: Secondary | ICD-10-CM | POA: Insufficient documentation

## 2018-06-19 DIAGNOSIS — E1151 Type 2 diabetes mellitus with diabetic peripheral angiopathy without gangrene: Secondary | ICD-10-CM | POA: Insufficient documentation

## 2018-06-19 DIAGNOSIS — I10 Essential (primary) hypertension: Secondary | ICD-10-CM | POA: Insufficient documentation

## 2018-06-19 NOTE — Progress Notes (Signed)
[facility]  Patient name: Anthony [name] MRN: [id number] DOB: Jul 31, 1946 Sex: male   REASON FOR VISIT:    Follow up  HISOTRY OF PRESENT ILLNESS:    Anthony [name] is a 72 y.o. male who returns today for follow-up.  He is status post right external iliac, common femoral, superficial femoral, and profunda femoral endarterectomy with bovine pericardial patch and plasty on 10/10/2014.  This was done for lifestyle limiting claudication which failed conservative management.  His postoperative course was uncomplicated.  His quality of life has dramatically improved.  He reports no new symptoms.  There are no open wounds  In December 2017 he underwent angiography for possible stenosis.  Angiography revealed his intervention to be widely patent.  The patient suffers from diabetes.  His most recent hemoglobin A1c is 7.1.  He is a former smoker.  He takes a statin for hypercholesterolemia.  He is on an ARB for hypertension.  He has been having bilateral knee pain, treated by Dr. Erlinda Hong with injections   PAST MEDICAL HISTORY:   Past Medical History:  Diagnosis Date  . Arthritis   . CAD (coronary artery disease)    stent placed 06/02/2000  . Chronic respiratory failure with hypoxia ([place]) 08/11/2012  . Congestive heart failure ([place])   . COPD (chronic obstructive pulmonary disease) ([place]) 08/11/2012  . Diabetes mellitus ([place])   . Hyperlipidemia   . Hypertension   . Lumbar stenosis   . OSA (obstructive sleep apnea) 08/11/2012   cpcap  . Peripheral vascular disease ([place])   . Pneumonia 1999  . Shortness of breath    with exertion     FAMILY HISTORY:   Family History  Problem Relation Age of Onset  . Heart disease Father 64  . Cancer Father   . Hyperlipidemia Father   . Hypertension Father   . Heart attack Father   . Cancer Mother   . Deep vein thrombosis Mother        Varicose veins  . Diabetes Mother   .  Hyperlipidemia Mother   . Hypertension Mother     SOCIAL HISTORY:   Social History   Tobacco Use  . Smoking status: Former Smoker    Packs/day: 2.00    Years: 28.00    Pack years: 56.00    Types: Cigarettes    Last attempt to quit: 08/03/1987    Years since quitting: 30.8  . Smokeless tobacco: Never Used  Substance Use Topics  . Alcohol use: Yes    Comment: weekly     ALLERGIES:   No Known Allergies   CURRENT MEDICATIONS:   Current Outpatient Medications  Medication Sig Dispense Refill  . aspirin 325 MG tablet Take 325 mg by mouth at bedtime.     Marland Kitchen b complex vitamins tablet Take 1 tablet by mouth daily.    . B Complex-Folic Acid (BENFOTIAMINE MULTI-B PO) Take 1 tablet by mouth 2 (two) times daily.     . bisoprolol (ZEBETA) 5 MG tablet Take 1 tablet (5 mg total) by mouth daily. 90 tablet 3  . CINNAMON PO Take 2,000 mg by mouth every evening.     Marland Kitchen doxazosin (CARDURA) 2 MG tablet Take 2 mg by mouth every evening.     Marland Kitchen doxycycline (ADOXA) 50 MG tablet Take 50 mg by mouth every evening.     . empagliflozin (JARDIANCE) 10 MG TABS tablet Take 10 mg by mouth daily.    . fish oil-omega-3 fatty  acids 1000 MG capsule Take 2 g by mouth daily.     . furosemide (LASIX) 20 MG tablet Take 20 mg by mouth 2 (two) times daily.     Marland Kitchen GARLIC PO Take 7,341 mg by mouth daily.     Marland Kitchen glipiZIDE (GLUCOTROL) 10 MG tablet Take 10 mg by mouth daily before breakfast.    . isosorbide mononitrate (IMDUR) 30 MG 24 hr tablet Take 1 tablet (30 mg total) by mouth daily. 90 tablet 3  . L-Lysine 1000 MG TABS Take 1,000 mg by mouth daily.     Marland Kitchen losartan (COZAAR) 100 MG tablet Take 100 mg by mouth daily.    . metFORMIN (GLUCOPHAGE) 1000 MG tablet Take 1,000 mg by mouth 2 (two) times daily with a meal.    . Multiple Vitamin (MULTIVITAMIN) tablet Take 1 tablet by mouth daily.    . pentoxifylline (TRENTAL) 400 MG CR tablet TAKE 1 TABLET BY MOUTH  TWICE A DAY 180 tablet 2  . potassium chloride SA  (K-DUR,KLOR-CON) 20 MEQ tablet Take 20 mEq by mouth 2 (two) times daily.    Marland Kitchen PROAIR HFA 108 (90 Base) MCG/ACT inhaler USE 2 PUFFS EVERY 6 HOURS  AS NEEDED FOR WHEEZING OR  SHORTNESS OF BREATH 1 Inhaler 2  . rOPINIRole (REQUIP) 1 MG tablet Take 1 mg by mouth at bedtime.    . simvastatin (ZOCOR) 40 MG tablet Take 40 mg by mouth every evening.    . sitaGLIPtin (JANUVIA) 100 MG tablet Take 100 mg by mouth daily.    Marland Kitchen SPIRIVA HANDIHALER 18 MCG inhalation capsule INHALE THE CONTENTS OF 1  CAPSULE VIA HANDIHALER  DAILY 90 capsule 3   Current Facility-Administered Medications  Medication Dose Route Frequency Provider Last Rate Last Dose  . 0.9 %  sodium chloride infusion  500 mL Intravenous Continuous Milus Banister, MD        REVIEW OF SYSTEMS:   [X]  denotes positive finding, [ ]  denotes negative finding Cardiac  Comments:  Chest pain or chest pressure:    Shortness of breath upon exertion:    Short of breath when lying flat:    Irregular heart rhythm:        Vascular    Pain in calf, thigh, or hip brought on by ambulation:    Pain in feet at night that wakes you up from your sleep:     Blood clot in your veins:    Leg swelling:         Pulmonary    Oxygen at home:    Productive cough:     Wheezing:         Neurologic    Sudden weakness in arms or legs:     Sudden numbness in arms or legs:     Sudden onset of difficulty speaking or slurred speech:    Temporary loss of vision in one eye:     Problems with dizziness:         Gastrointestinal    Blood in stool:     Vomited blood:         Genitourinary    Burning when urinating:     Blood in urine:        Psychiatric    Major depression:         Hematologic    Bleeding problems:    Problems with blood clotting too easily:        Skin    Rashes or ulcers:  Constitutional    Fever or chills:      PHYSICAL EXAM:   Vitals:   06/19/18 0955  BP: (!) 96/56  Pulse: 64  Temp: (!) 97.1 F (36.2 C)  TempSrc:  Oral  SpO2: 93%  Weight: 247 lb (112 kg)  Height: 5\' 9"  (1.753 m)    GENERAL: The patient is a well-nourished male, in no acute distress. The vital signs are documented above. CARDIAC: There is a regular rate and rhythm.  VASCULAR: non palpable pedal pulses PULMONARY: Non-labored respirations  MUSCULOSKELETAL: There are no major deformities or cyanosis. NEUROLOGIC: No focal weakness or paresthesias are detected. SKIN: There are no ulcers or rashes noted. PSYCHIATRIC: The patient has a normal affect.  STUDIES:   I have ordered and reviewed his vascular lab studies as follows: ABI: Right= 0.59 (0.63)        Left= 0.56 (0.79  There is a 50-74% stenosis in the mid right superficial femoral artery 75-9 9% stenosis noted in the left common femoral artery.  Right popliteal artery is known to be occluded.  MEDICAL ISSUES:   PAD the right: Femoral intervention remains widely patent without stenosis.  There is been progression of the stenosis within the left common femoral artery however this is not symptomatic.  We discussed continue with maximal medical therapy and plans for repeat ABIs and duplex in 1 year.  He will contact me sooner if he develops a change in his symptoms.    Annamarie Major, MD [facility] [phone] Pager [phone]

## 2018-06-22 ENCOUNTER — Other Ambulatory Visit: Payer: Self-pay | Admitting: Vascular Surgery

## 2018-06-23 ENCOUNTER — Telehealth (INDEPENDENT_AMBULATORY_CARE_PROVIDER_SITE_OTHER): Payer: Self-pay

## 2018-06-23 NOTE — Telephone Encounter (Signed)
Patient approved for SynviscOne injection, left knee. Buy & Bill No PA required Patient will be covered @ 90% after deductible and responsible for 10% OOP.  Appt.Scheduled with Dr. Erlinda Hong.

## 2018-06-28 ENCOUNTER — Ambulatory Visit (INDEPENDENT_AMBULATORY_CARE_PROVIDER_SITE_OTHER): Payer: Medicare Other | Admitting: Orthopaedic Surgery

## 2018-06-28 ENCOUNTER — Encounter (INDEPENDENT_AMBULATORY_CARE_PROVIDER_SITE_OTHER): Payer: Self-pay | Admitting: Orthopaedic Surgery

## 2018-06-28 DIAGNOSIS — M1712 Unilateral primary osteoarthritis, left knee: Secondary | ICD-10-CM | POA: Diagnosis not present

## 2018-06-28 MED ORDER — HYLAN G-F 20 48 MG/6ML IX SOSY
48.0000 mg | PREFILLED_SYRINGE | INTRA_ARTICULAR | Status: AC | PRN
  Administered 2018-06-28: 48 mg via INTRA_ARTICULAR

## 2018-06-28 NOTE — Progress Notes (Signed)
   Procedure Note  Patient: Anthony [name]             Date of Birth: Aug 09, 1946           MRN: [id number]             Visit Date: 06/28/2018  Procedures: Visit Diagnoses: Unilateral primary osteoarthritis, left knee - Plan: Large Joint Inj: L knee  Large Joint Inj: L knee on 06/28/2018 2:52 PM Indications: pain Details: 22 G needle, anterolateral approach Medications: 48 mg Hylan 48 MG/6ML

## 2018-08-01 ENCOUNTER — Encounter: Payer: Self-pay | Admitting: Podiatry

## 2018-08-01 ENCOUNTER — Ambulatory Visit (INDEPENDENT_AMBULATORY_CARE_PROVIDER_SITE_OTHER): Payer: Medicare Other | Admitting: Podiatry

## 2018-08-01 DIAGNOSIS — M79676 Pain in unspecified toe(s): Secondary | ICD-10-CM | POA: Diagnosis not present

## 2018-08-01 DIAGNOSIS — B351 Tinea unguium: Secondary | ICD-10-CM

## 2018-08-01 DIAGNOSIS — E1151 Type 2 diabetes mellitus with diabetic peripheral angiopathy without gangrene: Secondary | ICD-10-CM | POA: Diagnosis not present

## 2018-08-01 DIAGNOSIS — Q828 Other specified congenital malformations of skin: Secondary | ICD-10-CM

## 2018-08-01 NOTE — Progress Notes (Signed)
Subjective: 72 y.o. returns the office today for painful, elongated, thickened toenails which he cannot trim himself and he also has a callus on the right foot. Denies any redness or drainage around the nails.  He also is a callus to the right foot.  I did have trimmed.  Denies any acute changes since last appointment and no new complaints today. Denies any systemic complaints such as fevers, chills, nausea, vomiting.   PCP: [name], MD  Objective: AAO 3, NAD DP/PT pulses palpable, CRT less than 3 seconds Nails hypertrophic, dystrophic, elongated, brittle, discolored 8. There is tenderness overlying the nails 1-5 on the right and 3 through 5 on the left. There is no surrounding erythema or drainage along the nail sites. Hyperkeratotic lesion right medial first MPJ although very minimal. There is a new hyperkeratotic lesion to the distal right 3rd toe.Marland Kitchen  Upon debridement there is no underlying ulceration, drainage or any clinical signs of infection noted today. No open lesions or pre-ulcerative lesions are identified. Hammertoes present.  No pain with calf compression, swelling, warmth, erythema.  Assessment: Patient presents with symptomatic onychomycosis, Hyperkeratotic lesion Plan: -Treatment options including alternatives, risks, complications were discussed -Nails sharply debrided 8 without complication/bleeding. -Hyperkeratotic lesion sharply debrided x2 without any complications or bleeding -Discussed daily foot inspection. If there are any changes, to call the office immediately.  -Follow-up in 3 months or sooner if any problems are to arise. In the meantime, encouraged to call the office with any questions, concerns, changes symptoms.  Celesta Gentile, DPM

## 2018-09-20 ENCOUNTER — Other Ambulatory Visit (HOSPITAL_COMMUNITY): Payer: Self-pay | Admitting: Internal Medicine

## 2018-11-14 ENCOUNTER — Encounter: Payer: Self-pay | Admitting: Podiatry

## 2018-11-14 ENCOUNTER — Ambulatory Visit (INDEPENDENT_AMBULATORY_CARE_PROVIDER_SITE_OTHER): Payer: Medicare Other | Admitting: Podiatry

## 2018-11-14 DIAGNOSIS — M79674 Pain in right toe(s): Secondary | ICD-10-CM

## 2018-11-14 DIAGNOSIS — B351 Tinea unguium: Secondary | ICD-10-CM | POA: Diagnosis not present

## 2018-11-14 DIAGNOSIS — M79675 Pain in left toe(s): Secondary | ICD-10-CM | POA: Diagnosis not present

## 2018-11-14 DIAGNOSIS — L84 Corns and callosities: Secondary | ICD-10-CM

## 2018-11-14 DIAGNOSIS — E1151 Type 2 diabetes mellitus with diabetic peripheral angiopathy without gangrene: Secondary | ICD-10-CM | POA: Diagnosis not present

## 2018-11-14 NOTE — Patient Instructions (Signed)

## 2018-11-14 NOTE — Progress Notes (Signed)
Subjective:Subjective: Anthony [name] is a 72 y.o. y.o. male who presents today for preventative care with diabetes, PAD and painful, mycotic toenails which interfere with daily activities. Pain is aggravated when wearing enclosed shoe gear and relieved with periodic professional debridement.    Objective: Vascular Examination: Capillary refill time <3 seconds x 10 digits Dorsalis pedis pulses faintly palpable b/l Posterior tibial pulses palpable on the right; nonpalpable on the left  No digital hair x 10 digits Skin temperature warm to cool b/l  Dermatological Examination: Skin thin, shiny and atrophic b/l Toenails 3-5 left, 1, 3, 4, 5 right discolored, thick, dystrophic with subungual debris and pain with palpation to nailbeds due to thickness of nails.  Anonychia secondary to permanent total nail avulsion left hallux and b/l 2nd digits Hyperkeratotic lesion noted submetatarsal head 1 b/l Hyperkeratotic lesion noted distal tip right 3rd digit  Musculoskeletal: Muscle strength 5/5 to all LE muscle groups Hammertoe deformity 2-5 b/l  Neurological: Sensation diminished with 10 gram monofilament. Vibratory sensation diminished  Assessment: 1. Painful onychomycosis toenails 3 through 5 on the left,; 1, 3, 4, 5 on the right foot   2. Corns distal tip right third digit; calluses submetatarsal head 1 bilaterally 3. NIDDM with Peripheral arterial disease 4. Diabetic neuropathy  Plan: 1. Continue diabetic foot care principles. Literature dispensed. 2. Toenails  3 through 5 on the left,; 1, 3, 4, 5 on the right foot were debrided in length and girth without iatrogenic bleeding. 3. Corns distal tip right third digit; calluses submetatarsal head 1 bilaterally pared with sterile chisel blade and gently smoothed with burr. 4. Patient to continue soft, supportive shoe gear 5. Patient to report any pedal injuries to medical professional  6. Follow up 3 months. Patient/POA to call should  there be a concern in the interim.

## 2018-11-15 ENCOUNTER — Ambulatory Visit: Payer: Medicare Other | Admitting: Podiatry

## 2018-12-01 ENCOUNTER — Encounter: Payer: Self-pay | Admitting: Pulmonary Disease

## 2018-12-01 ENCOUNTER — Ambulatory Visit (INDEPENDENT_AMBULATORY_CARE_PROVIDER_SITE_OTHER): Payer: Medicare Other | Admitting: Pulmonary Disease

## 2018-12-01 VITALS — BP 118/80 | HR 77 | Ht 69.0 in | Wt 250.0 lb

## 2018-12-01 DIAGNOSIS — J42 Unspecified chronic bronchitis: Secondary | ICD-10-CM

## 2018-12-01 DIAGNOSIS — J310 Chronic rhinitis: Secondary | ICD-10-CM | POA: Diagnosis not present

## 2018-12-01 DIAGNOSIS — G4733 Obstructive sleep apnea (adult) (pediatric): Secondary | ICD-10-CM | POA: Diagnosis not present

## 2018-12-01 DIAGNOSIS — J986 Disorders of diaphragm: Secondary | ICD-10-CM | POA: Diagnosis not present

## 2018-12-01 MED ORDER — ALBUTEROL SULFATE HFA 108 (90 BASE) MCG/ACT IN AERS: 2.0000 | INHALATION_SPRAY | Freq: Four times a day (QID) | RESPIRATORY_TRACT | 3 refills | Status: DC | PRN

## 2018-12-01 NOTE — Progress Notes (Signed)
[facility]  Chief Complaint  Patient presents with  . Follow-up    Pt is needing a new cpap machine as his is over 29yrs old.    Constitutional:  BP 118/80 (BP Location: Left Arm, Cuff Size: Normal)   Pulse 77   Ht 5\' 9"  (1.753 m)   Wt 250 lb (113.4 kg)   SpO2 96%   BMI 36.92 kg/m   Past Medical History:  CAD, HLD, combined CHF, PAD, HTN, DM, Lumbar stenosis  Brief Summary:  Anthony [name] is a 72 y.o. male with COPD, OSA, and rt diaphragm elevation.  His CPAP machine is starting to wear out.  He uses every night.  No issue with mask fit.  He gets sinus congestion sometimes in the middle of the night after he takes his mask off to use the bathroom.  He will use flonase then, and this helps.   He has been getting more trouble with pain in his back.  He gets more winded and tired when his back pain flares up.  He has an appointment with Dr. Vertell Limber later this month.  He is not having cough, wheeze, sputum, chest pain, or leg swelling.  He uses spiriva daily.  He doesn't need to use albuterol much.  He recently had topical therapy on his face for precancerous lesions.  He will be going to [place] for the winter and return in May 2020 to [place].  Physical Exam:   Appearance - well kempt   ENMT - clear nasal mucosa, midline nasal  septum, no oral exudates, no LAN, trachea midline  Respiratory - normal chest wall, normal respiratory effort, no accessory muscle use, no wheeze/rales  CV - s1s2 regular rate and rhythm, no murmurs, no peripheral edema, radial pulses symmetric  GI - soft, non tender, no masses  Lymph - no adenopathy noted in neck and axillary areas  MSK - normal gait  Ext - no cyanosis, clubbing, or joint inflammation noted  Skin - erythema over his face  Neuro - normal strength, oriented x 3  Psych - normal mood and affect  Assessment/Plan:   COPD with chronic bronchitis. - continue spiriva and prn  albuterol  OSA. - he is compliant with CPAP and reports benefit - his machine is more than 74 yrs old and he needs a new machine - will continue CPAP 13 cm H2O  CPAP rhinitis. - prn flonase  Rt hemidiaphragm elevation. - monitor clinically   Patient Instructions  Will refill albuterol inhaler  Will arrange for new CPAP machine  Follow up in 5 months   [name], MD [place] Pager: [phone] 12/01/2018, 9:38 AM  Flow Sheet    Pulmonary tests:  PFT 08/13/12>>FEV1 1.83 (62%), FEV1% 64, TLC 5.17 (82%), DLCO 67%, +BD SNIFF test 08/14/12>>Paradoxical motion of the right hemidiaphragm with inspiration  Sleep tests:  PSG 08/14/12>>AHI 38.5. CPAP 13 cm H2O with 1 liter oxygen ONO with CPAP and RA 01/18/13 >> test time 10 hrs 37 min. Mean SpO2 94%, low SpO2 86%. Spent 16 sec with SpO2 < 88% CPAP 09/01/17 to 11/29/17 >> used on 90 of 90 nights with average 8 hrs 56 min.  Average AHI 0.9 with CPAP 13 cm H2O  Cardiac tests:  Echo 05/13/15 >> EF 45 to 63%, grade 2 diastolic dysfx, mod LVH, mod RV systolic dysfx  Medications:   Allergies as of 12/01/2018   No Known Allergies     Medication List  Accurate as of 12/01/18  9:38 AM. Always use your most recent med list.          albuterol 108 (90 Base) MCG/ACT inhaler Commonly known as:  PROVENTIL HFA;VENTOLIN HFA Inhale 2 puffs into the lungs every 6 (six) hours as needed for wheezing or shortness of breath.   aspirin 325 MG tablet Take 325 mg by mouth at bedtime.   b complex vitamins tablet Take 1 tablet by mouth daily.   BENFOTIAMINE MULTI-B PO Take 1 tablet by mouth 2 (two) times daily.   bisoprolol 5 MG tablet Commonly known as:  ZEBETA TAKE 1 TABLET BY MOUTH  DAILY   CINNAMON PO Take 2,000 mg by mouth every evening.   doxazosin 2 MG tablet Commonly known as:  CARDURA Take 2 mg by mouth every evening.   doxycycline 50 MG tablet Commonly known as:  ADOXA Take 50 mg by  mouth every evening.   fish oil-omega-3 fatty acids 1000 MG capsule Take 2 g by mouth daily.   furosemide 20 MG tablet Commonly known as:  LASIX Take 20 mg by mouth 2 (two) times daily.   GARLIC PO Take 6,301 mg by mouth daily.   glipiZIDE 10 MG tablet Commonly known as:  GLUCOTROL Take 10 mg by mouth daily before breakfast.   isosorbide mononitrate 30 MG 24 hr tablet Commonly known as:  IMDUR TAKE 1 TABLET BY MOUTH  DAILY   JARDIANCE 10 MG Tabs tablet Generic drug:  empagliflozin Take 10 mg by mouth daily.   L-Lysine 1000 MG Tabs Take 1,000 mg by mouth daily.   losartan 100 MG tablet Commonly known as:  COZAAR Take 100 mg by mouth daily.   Lysine HCl 1000 MG Tabs Take by mouth.   metFORMIN 1000 MG tablet Commonly known as:  GLUCOPHAGE Take 1,000 mg by mouth 2 (two) times daily with a meal.   multivitamin tablet Take 1 tablet by mouth daily.   omega-3 acid ethyl esters 1 g capsule Commonly known as:  LOVAZA Take by mouth.   pentoxifylline 400 MG CR tablet Commonly known as:  TRENTAL TAKE 1 TABLET BY MOUTH  TWICE A DAY   pentoxifylline 400 MG CR tablet Commonly known as:  TRENTAL TAKE 1 TABLET BY MOUTH  TWICE A DAY   potassium chloride SA 20 MEQ tablet Commonly known as:  K-DUR,KLOR-CON Take 20 mEq by mouth 2 (two) times daily.   rOPINIRole 1 MG tablet Commonly known as:  REQUIP Take 1 mg by mouth at bedtime.   simvastatin 40 MG tablet Commonly known as:  ZOCOR Take 40 mg by mouth every evening.   sitaGLIPtin 100 MG tablet Commonly known as:  JANUVIA Take 100 mg by mouth daily.   SPIRIVA HANDIHALER 18 MCG inhalation capsule Generic drug:  tiotropium INHALE THE CONTENTS OF 1  CAPSULE VIA HANDIHALER  DAILY       Past Surgical History:  He  has a past surgical history that includes Tonsillectomy (1965); Bunionectomy ([place]); Neuroma surgery (1996); Lumbar disc surgery (Dec 1997 & Ampril 2005); Cervical fusion (Nov 2002); Hammer toe  surgery (June 2007 & August 2008); Carpal tunnel release (09/30/2006); Hand Arthroplasty (Oct 2010); Heart catherization (03/26/1999); Angioplasty (06/02/2000); Spine surgery (04/21/2004); Abdominal angiogram (Dec. 4, 2013); Cardiac catheterization; Anterior lateral lumbar fusion 4 levels (Right, 05/28/2014); Lumbar percutaneous pedicle screw 4 level (N/A, 05/28/2014); Eye surgery (Bilateral, 2014); Colonoscopy w/ biopsies and polypectomy; Endarterectomy femoral (Right, 10/10/2014); abdominal aortagram (N/A, 11/29/2012); abdominal aortagram (N/A, 09/03/2014); and  Cardiac catheterization (N/A, 12/07/2016).  Family History:  His family history includes Cancer in his father and mother; Deep vein thrombosis in his mother; Diabetes in his mother; Heart attack in his father; Heart disease (age of onset: 75) in his father; Hyperlipidemia in his father and mother; Hypertension in his father and mother.  Social History:  He  reports that he quit smoking about 31 years ago. His smoking use included cigarettes. He has a 56.00 pack-year smoking history. He has never used smokeless tobacco. He reports that he drinks alcohol. He reports that he does not use drugs.

## 2018-12-01 NOTE — Patient Instructions (Signed)
Will refill albuterol inhaler  Will arrange for new CPAP machine  Follow up in 5 months

## 2018-12-07 ENCOUNTER — Other Ambulatory Visit: Payer: Self-pay | Admitting: Pulmonary Disease

## 2019-04-10 ENCOUNTER — Other Ambulatory Visit (HOSPITAL_COMMUNITY): Payer: Self-pay | Admitting: Internal Medicine

## 2019-04-16 ENCOUNTER — Other Ambulatory Visit: Payer: Self-pay

## 2019-04-16 ENCOUNTER — Encounter: Payer: Self-pay | Admitting: Gastroenterology

## 2019-04-16 ENCOUNTER — Ambulatory Visit (INDEPENDENT_AMBULATORY_CARE_PROVIDER_SITE_OTHER): Payer: Medicare Other | Admitting: Gastroenterology

## 2019-04-16 VITALS — Ht 69.0 in | Wt 240.0 lb

## 2019-04-16 DIAGNOSIS — Z800 Family history of malignant neoplasm of digestive organs: Secondary | ICD-10-CM

## 2019-04-16 DIAGNOSIS — K625 Hemorrhage of anus and rectum: Secondary | ICD-10-CM | POA: Diagnosis not present

## 2019-04-16 NOTE — Patient Instructions (Addendum)
He knows we will call him to schedule a colonoscopy for change in bowels, rectal bleeding, family history of colon cancer in the next 2 or 3 weeks.  He and his wife will be back from [place] on April 28.   He knows to start taking citrucel (orange flavored) powder fiber supplement.  This may cause some bloating at first but that usually goes away. Begin with a small spoonful and work your way up to a large, heaping spoonful daily over a week.  Call wife to set up colonoscopy for the week of 05/07/19 [phone]

## 2019-04-16 NOTE — Progress Notes (Signed)
Review of pertinent gastrointestinal problems: 1. FH of colon cancer (father, diagnosed in his 87s) Colonoscopy 2006; our records only show two color pictures from this report with no text. Colonoscopy 2011 Dr. Louie Casa In [place] October. Diverticulosis was found. One small (removed with biopsy forceps) hyperplastic by pathology polyps was found.  Colonoscopy Dr. Ardis Hughs October 2017 found left-sided diverticulosis, a single 4 mm polyp was removed from the sigmoid colon, it was a tubular adenoma and I recommended that he have a repeat colonoscopy at 5-year interval.   This service was provided via virtual visit.   Only audio was used.  The patient was located at home.  I was located in my office.  The patient did consent to this virtual visit and is aware of possible charges through their insurance for this visit.  He is an established patient but here talking about a new problem.    Nobody else participated in this virtual service however my certified medical assistant helped with follow-up recommendations.  Time spent on virtual visit: 35 min   HPI: This is a very pleasant 73 year old man whom I last saw at the time of a colonoscopy October 2017.  See those results summarized above  Today he is here to talk about a new problem.  One time he woke with red blood on the sheets, he sleeps in the nude. February 5th.   He thinks he had a hemorrhoid around then.  Since then he has had a maloderous discharge.  Having mucous production daily.  The mucus seems to be improving with time  He and his wife are currently in [place] due back on the 28th of this month  Overall stable weight.  No abdominal pains  Chief complaint is minor rectal bleeding, change in bowel habits, family history colon cancer  ROS: complete GI ROS as described in HPI, all other review negative.  Constitutional:  No unintentional weight loss   Past Medical History:  Diagnosis Date  . Arthritis   . CAD (coronary artery  disease)    stent placed 06/02/2000  . Chronic respiratory failure with hypoxia ([place]) 08/11/2012  . Congestive heart failure ([place])   . COPD (chronic obstructive pulmonary disease) ([place]) 08/11/2012  . Diabetes mellitus ([place])   . Hyperlipidemia   . Hypertension   . Lumbar stenosis   . OSA (obstructive sleep apnea) 08/11/2012   cpcap  . Peripheral vascular disease ([place])   . Pneumonia 1999  . Shortness of breath    with exertion    Past Surgical History:  Procedure Laterality Date  . ABDOMINAL ANGIOGRAM  Dec. 4, 2013  . ABDOMINAL AORTAGRAM N/A 11/29/2012   Procedure: ABDOMINAL Maxcine Ham;  Surgeon: Serafina Mitchell, MD;  Location[facility] CATH LAB;  Service: Cardiovascular;  Laterality: N/A;  . ABDOMINAL AORTAGRAM N/A 09/03/2014   Procedure: ABDOMINAL AORTAGRAM;  Surgeon: Serafina Mitchell, MD;  Location: [facility] CATH LAB;  Service: Cardiovascular;  Laterality: N/A;  . ANGIOPLASTY  06/02/2000   Stent  . ANTERIOR LATERAL LUMBAR FUSION 4 LEVELS Right 05/28/2014   Procedure: Right L4-5 L3-4 L2-3, L1-2  Anterior lateral lumbar fusion with percutaneaous pedicle screws. Lumbar four/five, three/four, two/three and possible two/one;  Surgeon: Erline Levine, MD;  Location: [facility] NEURO ORS;  Service: Neurosurgery;  Laterality: Right;  Lumbar One-Five Fusion with Percutaneous Screws  . [place]   right foot  . CARDIAC CATHETERIZATION    . CARPAL TUNNEL RELEASE  09/30/2006   right wrist  .  CERVICAL FUSION  Nov 2002  . COLONOSCOPY W/ BIOPSIES AND POLYPECTOMY     benign  . ENDARTERECTOMY FEMORAL Right 10/10/2014   Procedure: RIGHT FEMORAL ARTERY ENDARTERECTOMY  WITH VASCU GUARD PATCH ANGIOPLASTY;  Surgeon: Serafina Mitchell, MD;  Location: [facility];  Service: Vascular;  Laterality: Right;  . EYE SURGERY Bilateral 2014   cataracts  . HAMMER TOE SURGERY  June 2007 & August 2008   left foot  . HAND ARTHROPLASTY  Oct 2010   right thumb  . Heart catherization  03/26/1999  . [place] SURGERY  Dec 1997 & Ampril  2005  . LUMBAR PERCUTANEOUS PEDICLE SCREW 4 LEVEL N/A 05/28/2014   Procedure: LUMBAR PERCUTANEOUS PEDICLE SCREW 4 LEVEL;  Surgeon: Erline Levine, MD;  Location: [facility];  Service: Neurosurgery;  Laterality: N/A;  . [place]   right foot  . PERIPHERAL VASCULAR CATHETERIZATION N/A 12/07/2016   Procedure: Abdominal Aortogram w/Lower Extremity;  Surgeon: Serafina Mitchell, MD;  Location: [facility] CV LAB;  Service: Cardiovascular;  Laterality: N/A;  . SPINE SURGERY  04/21/2004   Lower back disk gurgery- Lumbar stenosis  . TONSILLECTOMY  1965    Current Outpatient Medications  Medication Sig Dispense Refill  . albuterol (PROAIR HFA) 108 (90 Base) MCG/ACT inhaler Inhale 2 puffs into the lungs every 6 (six) hours as needed for wheezing or shortness of breath. 3 Inhaler 3  . aspirin 325 MG tablet Take 325 mg by mouth at bedtime.     Marland Kitchen b complex vitamins tablet Take 1 tablet by mouth daily.    . B Complex-Folic Acid (BENFOTIAMINE MULTI-B PO) Take 1 tablet by mouth 2 (two) times daily.     . bisoprolol (ZEBETA) 5 MG tablet TAKE 1 TABLET BY MOUTH  DAILY 90 tablet 1  . CINNAMON PO Take 2,000 mg by mouth every evening.     Marland Kitchen doxazosin (CARDURA) 2 MG tablet Take 2 mg by mouth every evening.     Marland Kitchen doxycycline (ADOXA) 50 MG tablet Take 50 mg by mouth every evening.     . empagliflozin (JARDIANCE) 10 MG TABS tablet Take 10 mg by mouth daily.    . fish oil-omega-3 fatty acids 1000 MG capsule Take 2 g by mouth daily.     . furosemide (LASIX) 20 MG tablet Take 20 mg by mouth 2 (two) times daily.     Marland Kitchen GARLIC PO Take 2,778 mg by mouth daily.     Marland Kitchen glipiZIDE (GLUCOTROL) 10 MG tablet Take 10 mg by mouth daily before breakfast.    . isosorbide mononitrate (IMDUR) 30 MG 24 hr tablet TAKE 1 TABLET BY MOUTH  DAILY 90 tablet 1  . L-Lysine 1000 MG TABS Take 1,000 mg by mouth daily.     Marland Kitchen losartan (COZAAR) 100 MG tablet Take 100 mg by mouth daily.    . metFORMIN (GLUCOPHAGE) 1000 MG tablet Take 1,000  mg by mouth 2 (two) times daily with a meal.    . Multiple Vitamin (MULTIVITAMIN) tablet Take 1 tablet by mouth daily.    Marland Kitchen omega-3 acid ethyl esters (LOVAZA) 1 g capsule Take by mouth.    . pentoxifylline (TRENTAL) 400 MG CR tablet TAKE 1 TABLET BY MOUTH  TWICE A DAY 180 tablet 2  . potassium chloride SA (K-DUR,KLOR-CON) 20 MEQ tablet Take 20 mEq by mouth 2 (two) times daily.    Marland Kitchen rOPINIRole (REQUIP) 1 MG tablet Take 1 mg by mouth at bedtime.    Marland Kitchen  simvastatin (ZOCOR) 40 MG tablet Take 40 mg by mouth every evening.    . sitaGLIPtin (JANUVIA) 100 MG tablet Take 100 mg by mouth daily.    Marland Kitchen SPIRIVA HANDIHALER 18 MCG inhalation capsule INHALE THE CONTENTS OF 1  CAPSULE VIA HANDIHALER  DAILY 90 capsule 3   Current Facility-Administered Medications  Medication Dose Route Frequency Provider Last Rate Last Dose  . 0.9 %  sodium chloride infusion  500 mL Intravenous Continuous Milus Banister, MD        Allergies as of 04/16/2019  . (No Known Allergies)    Family History  Problem Relation Age of Onset  . Heart disease Father 63  . Cancer Father   . Hyperlipidemia Father   . Hypertension Father   . Heart attack Father   . Cancer Mother   . Deep vein thrombosis Mother        Varicose veins  . Diabetes Mother   . Hyperlipidemia Mother   . Hypertension Mother     Social History   Socioeconomic History  . Marital status: Married    Spouse name: Not on file  . Number of children: Not on file  . Years of education: Not on file  . Highest education level: Not on file  Occupational History  . Occupation: retired  Scientific laboratory technician  . Financial resource strain: Not on file  . Food insecurity:    Worry: Not on file    Inability: Not on file  . Transportation needs:    Medical: Not on file    Non-medical: Not on file  Tobacco Use  . Smoking status: Former Smoker    Packs/day: 2.00    Years: 28.00    Pack years: 56.00    Types: Cigarettes    Last attempt to quit: 08/03/1987    Years  since quitting: 31.7  . Smokeless tobacco: Never Used  Substance and Sexual Activity  . Alcohol use: Yes    Comment: weekly  . Drug use: No  . Sexual activity: Not on file  Lifestyle  . Physical activity:    Days per week: Not on file    Minutes per session: Not on file  . Stress: Not on file  Relationships  . Social connections:    Talks on phone: Not on file    Gets together: Not on file    Attends religious service: Not on file    Active member of club or organization: Not on file    Attends meetings of clubs or organizations: Not on file    Relationship status: Not on file  . Intimate partner violence:    Fear of current or ex partner: Not on file    Emotionally abused: Not on file    Physically abused: Not on file    Forced sexual activity: Not on file  Other Topics Concern  . Not on file  Social History Narrative  . Not on file     Physical Exam: Unable to perform because this was a "telemed visit" due to current Covid-19 pandemic Ht 5\' 9"  (1.753 m)   Wt 240 lb (108.9 kg)   BMI 35.44 kg/m    Assessment and plan: 73 y.o. male with minor rectal bleeding, mucus discharge, family history of colon cancer  I explained to him that I think this is on likely anything serious however with his personal history of adenomatous polyps, his family history of colon cancer in his father and his symptoms now I recommended a  colonoscopy at his soonest convenience.  He and his wife will be driving back from [place] later this week.  They will return on 28 April.  I recommended that in the meantime before colonoscopy that he start taking a fiber supplement once daily to see if this can help dry up some more of the mucus production.  Please see the "Patient Instructions" section for addition details about the plan.  Owens Loffler, MD [facility] 04/16/2019, 10:19 AM

## 2019-04-25 ENCOUNTER — Other Ambulatory Visit: Payer: Self-pay | Admitting: Gastroenterology

## 2019-04-25 DIAGNOSIS — K625 Hemorrhage of anus and rectum: Secondary | ICD-10-CM

## 2019-04-25 MED ORDER — PEG 3350-KCL-NA BICARB-NACL 420 G PO SOLR: 4000.0000 mL | ORAL | 0 refills | Status: DC

## 2019-04-27 ENCOUNTER — Other Ambulatory Visit: Payer: Self-pay | Admitting: Neurosurgery

## 2019-04-27 ENCOUNTER — Other Ambulatory Visit: Payer: Self-pay | Admitting: General Surgery

## 2019-04-27 DIAGNOSIS — M4310 Spondylolisthesis, site unspecified: Secondary | ICD-10-CM

## 2019-05-01 ENCOUNTER — Other Ambulatory Visit: Payer: Self-pay

## 2019-05-01 ENCOUNTER — Encounter: Payer: Self-pay | Admitting: Podiatry

## 2019-05-01 ENCOUNTER — Ambulatory Visit (INDEPENDENT_AMBULATORY_CARE_PROVIDER_SITE_OTHER): Payer: Medicare Other | Admitting: Podiatry

## 2019-05-01 VITALS — Temp 97.9°F

## 2019-05-01 DIAGNOSIS — E1151 Type 2 diabetes mellitus with diabetic peripheral angiopathy without gangrene: Secondary | ICD-10-CM | POA: Diagnosis not present

## 2019-05-01 DIAGNOSIS — M79675 Pain in left toe(s): Secondary | ICD-10-CM

## 2019-05-01 DIAGNOSIS — B351 Tinea unguium: Secondary | ICD-10-CM | POA: Diagnosis not present

## 2019-05-01 DIAGNOSIS — L84 Corns and callosities: Secondary | ICD-10-CM | POA: Diagnosis not present

## 2019-05-01 DIAGNOSIS — M79674 Pain in right toe(s): Secondary | ICD-10-CM

## 2019-05-01 NOTE — Patient Instructions (Signed)
Diabetes Mellitus and Foot Care Foot care is an important part of your health, especially when you have diabetes. Diabetes may cause you to have problems because of poor blood flow (circulation) to your feet and legs, which can cause your skin to:  Become thinner and drier.  Break more easily.  Heal more slowly.  Peel and crack. You may also have nerve damage (neuropathy) in your legs and feet, causing decreased feeling in them. This means that you may not notice minor injuries to your feet that could lead to more serious problems. Noticing and addressing any potential problems early is the best way to prevent future foot problems. How to care for your feet Foot hygiene  Wash your feet daily with warm water and mild soap. Do not use hot water. Then, pat your feet and the areas between your toes until they are completely dry. Do not soak your feet as this can dry your skin.  Trim your toenails straight across. Do not dig under them or around the cuticle. File the edges of your nails with an emery board or nail file.  Apply a moisturizing lotion or petroleum jelly to the skin on your feet and to dry, brittle toenails. Use lotion that does not contain alcohol and is unscented. Do not apply lotion between your toes. Shoes and socks  Wear clean socks or stockings every day. Make sure they are not too tight. Do not wear knee-high stockings since they may decrease blood flow to your legs.  Wear shoes that fit properly and have enough cushioning. Always look in your shoes before you put them on to be sure there are no objects inside.  To break in new shoes, wear them for just a few hours a day. This prevents injuries on your feet. Wounds, scrapes, corns, and calluses  Check your feet daily for blisters, cuts, bruises, sores, and redness. If you cannot see the bottom of your feet, use a mirror or ask someone for help.  Do not cut corns or calluses or try to remove them with medicine.  If you  find a minor scrape, cut, or break in the skin on your feet, keep it and the skin around it clean and dry. You may clean these areas with mild soap and water. Do not clean the area with peroxide, alcohol, or iodine.  If you have a wound, scrape, corn, or callus on your foot, look at it several times a day to make sure it is healing and not infected. Check for: ? Redness, swelling, or pain. ? Fluid or blood. ? Warmth. ? Pus or a bad smell. General instructions  Do not cross your legs. This may decrease blood flow to your feet.  Do not use heating pads or hot water bottles on your feet. They may burn your skin. If you have lost feeling in your feet or legs, you may not know this is happening until it is too late.  Protect your feet from hot and cold by wearing shoes, such as at the beach or on hot pavement.  Schedule a complete foot exam at least once a year (annually) or more often if you have foot problems. If you have foot problems, report any cuts, sores, or bruises to your health care provider immediately. Contact a health care provider if:  You have a medical condition that increases your risk of infection and you have any cuts, sores, or bruises on your feet.  You have an injury that is not   healing.  You have redness on your legs or feet.  You feel burning or tingling in your legs or feet.  You have pain or cramps in your legs and feet.  Your legs or feet are numb.  Your feet always feel cold.  You have pain around a toenail. Get help right away if:  You have a wound, scrape, corn, or callus on your foot and: ? You have pain, swelling, or redness that gets worse. ? You have fluid or blood coming from the wound, scrape, corn, or callus. ? Your wound, scrape, corn, or callus feels warm to the touch. ? You have pus or a bad smell coming from the wound, scrape, corn, or callus. ? You have a fever. ? You have a red line going up your leg. Summary  Check your feet every day  for cuts, sores, red spots, swelling, and blisters.  Moisturize feet and legs daily.  Wear shoes that fit properly and have enough cushioning.  If you have foot problems, report any cuts, sores, or bruises to your health care provider immediately.  Schedule a complete foot exam at least once a year (annually) or more often if you have foot problems. This information is not intended to replace advice given to you by your health care provider. Make sure you discuss any questions you have with your health care provider. Document Released: 12/10/2000 Document Revised: 01/25/2018 Document Reviewed: 01/14/2017 Elsevier Interactive Patient Education  2019 [employer]  Onychomycosis/Fungal Toenails  WHAT IS IT? An infection that lies within the keratin of your nail plate that is caused by a fungus.  WHY ME? Fungal infections affect all ages, sexes, races, and creeds.  There may be many factors that predispose you to a fungal infection such as age, coexisting medical conditions such as diabetes, or an autoimmune disease; stress, medications, fatigue, genetics, etc.  Bottom line: fungus thrives in a warm, moist environment and your shoes offer such a location.  IS IT CONTAGIOUS? Theoretically, yes.  You do not want to share shoes, nail clippers or files with someone who has fungal toenails.  Walking around barefoot in the same room or sleeping in the same bed is unlikely to transfer the organism.  It is important to realize, however, that fungus can spread easily from one nail to the next on the same foot.  HOW DO WE TREAT THIS?  There are several ways to treat this condition.  Treatment may depend on many factors such as age, medications, pregnancy, liver and kidney conditions, etc.  It is best to ask your doctor which options are available to you.  1. No treatment.   Unlike many other medical concerns, you can live with this condition.  However for many people this can be a painful condition and  may lead to ingrown toenails or a bacterial infection.  It is recommended that you keep the nails cut short to help reduce the amount of fungal nail. 2. Topical treatment.  These range from herbal remedies to prescription strength nail lacquers.  About 40-50% effective, topicals require twice daily application for approximately 9 to 12 months or until an entirely new nail has grown out.  The most effective topicals are medical grade medications available through physicians offices. 3. Oral antifungal medications.  With an 80-90% cure rate, the most common oral medication requires 3 to 4 months of therapy and stays in your system for a year as the new nail grows out.  Oral antifungal medications do require   blood work to make sure it is a safe drug for you.  A liver function panel will be performed prior to starting the medication and after the first month of treatment.  It is important to have the blood work performed to avoid any harmful side effects.  In general, this medication safe but blood work is required. 4. Laser Therapy.  This treatment is performed by applying a specialized laser to the affected nail plate.  This therapy is noninvasive, fast, and non-painful.  It is not covered by insurance and is therefore, out of pocket.  The results have been very good with a 80-95% cure rate.  The [facility] is the only practice in the area to offer this therapy. 5. Permanent Nail Avulsion.  Removing the entire nail so that a new nail will not grow back. 

## 2019-05-04 ENCOUNTER — Other Ambulatory Visit (HOSPITAL_COMMUNITY): Payer: Self-pay | Admitting: *Deleted

## 2019-05-04 ENCOUNTER — Other Ambulatory Visit: Payer: Self-pay

## 2019-05-04 ENCOUNTER — Ambulatory Visit (HOSPITAL_COMMUNITY)
Admission: RE | Admit: 2019-05-04 | Discharge: 2019-05-04 | Disposition: A | Discharge status: Home / Self Care | Payer: Medicare Other | Source: Ambulatory Visit | Attending: Internal Medicine | Admitting: Internal Medicine

## 2019-05-04 DIAGNOSIS — I2510 Atherosclerotic heart disease of native coronary artery without angina pectoris: Secondary | ICD-10-CM

## 2019-05-04 DIAGNOSIS — I10 Essential (primary) hypertension: Secondary | ICD-10-CM

## 2019-05-04 DIAGNOSIS — R0600 Dyspnea, unspecified: Secondary | ICD-10-CM

## 2019-05-04 DIAGNOSIS — I5032 Chronic diastolic (congestive) heart failure: Secondary | ICD-10-CM

## 2019-05-04 DIAGNOSIS — R0609 Other forms of dyspnea: Secondary | ICD-10-CM

## 2019-05-04 DIAGNOSIS — I739 Peripheral vascular disease, unspecified: Secondary | ICD-10-CM

## 2019-05-04 NOTE — Progress Notes (Signed)
Heart Failure TeleHealth Note  Due to national recommendations of social distancing due to [place] 19, Audio/video telehealth visit is felt to be most appropriate for this patient at this time.  See MyChart message from today for patient consent regarding telehealth for [facility].  Date:  05/04/2019   ID:  Anthony [name], DOB 29-Sep-1946, MRN [id number]  Location: Home  Provider location: [facility] Type of Visit: Established patient  PCP:  [name], MD  Cardiologist:  No primary care provider on file. Primary HF: Grettel Rames  Chief Complaint: Heart Failure follow-up   History of Present Illness:  Anthony [name] is a 73 y.o. male with h/o obesity, HTN, HL, severe osteoarthritis, DM2 (diagnosed 2009), OSA (intolerant of CPAP), former smoker (quit 1989), CAD s/p stent 2001 and PAD. He is the father-in-law of Dr. [name] in Interventional Radiology.    Had cath in [place] in 2001. Was told he had 70% blockage in one artery and the one in the back was totally blocked. Eventually underwent stenting of that arter. No caths since. Myoview 10/14: Normal. LV Ejection Fraction: 56%. LV Wall Motion: NL LV Function; NL Wall Motion  Has severe PAD R>L followed by Dr. [name]. Now status post right external iliac, common femoral, superficial femoral endarterectomy with bovine pericardial patch angioplasty on 10/10/2014  Saw Dr. Halford Chessman and placed on home O2 but now weaned off with inhalers. Checks sats with pulse oximeter  He presents via [employer] (consulting) for a telehealth visit today. Says he is having more exertional dyspnea over the past few months - similar . No CP or resting symptoms. Minimal edema. No orthopnea or PND. Weight stable at 247.  BP 140/74 HR 59. Uses CPAP qHS. Still having some claudication but stable. Has coloscopy scheduled for some rectal bleeding.    PFTs with moderate COPD FEV1 1.83 (62%) FVC   2.84 (66%) FEF 25-75% 0.79  (29%) DLCO 67  ECHO 10/13 EF 50-55%  ECHO 5/16: EF 45-50% mold to moderate RV dysfunction Grade II DD  CXR with elevated R hemidiaphragm. Sleep study with severe OS - AHI 39.       Anthony [name] denies symptoms worrisome for COVID 19.   Past Medical History:  Diagnosis Date  . Arthritis   . CAD (coronary artery disease)    stent placed 06/02/2000  . Chronic respiratory failure with hypoxia ([place]) 08/11/2012  . Congestive heart failure ([place])   . COPD (chronic obstructive pulmonary disease) ([place]) 08/11/2012  . Diabetes mellitus ([place])   . Hyperlipidemia   . Hypertension   . Lumbar stenosis   . OSA (obstructive sleep apnea) 08/11/2012   cpcap  . Peripheral vascular disease ([place])   . Pneumonia 1999  . Shortness of breath    with exertion   Past Surgical History:  Procedure Laterality Date  . ABDOMINAL ANGIOGRAM  Dec. 4, 2013  . ABDOMINAL AORTAGRAM N/A 11/29/2012   Procedure: ABDOMINAL Maxcine Ham;  Surgeon: Serafina Mitchell, MD;  Location: [facility] CATH LAB;  Service: Cardiovascular;  Laterality: N/A;  . ABDOMINAL AORTAGRAM N/A 09/03/2014   Procedure: ABDOMINAL AORTAGRAM;  Surgeon: Serafina Mitchell, MD;  Location: [facility] CATH LAB;  Service: Cardiovascular;  Laterality: N/A;  . ANGIOPLASTY  06/02/2000   Stent  . ANTERIOR LATERAL LUMBAR FUSION 4 LEVELS Right 05/28/2014   Procedure: Right L4-5 L3-4 L2-3, L1-2  Anterior lateral lumbar fusion with percutaneaous pedicle screws. Lumbar four/five, three/four, two/three and possible two/one;  Surgeon:  Erline Levine, MD;  Location: [facility] NEURO ORS;  Service: Neurosurgery;  Laterality: Right;  Lumbar One-Five Fusion with Percutaneous Screws  . [place]   right foot  . CARDIAC CATHETERIZATION    . CARPAL TUNNEL RELEASE  09/30/2006   right wrist  . CERVICAL FUSION  Nov 2002  . COLONOSCOPY W/ BIOPSIES AND POLYPECTOMY     benign  . ENDARTERECTOMY FEMORAL Right 10/10/2014   Procedure: RIGHT FEMORAL ARTERY ENDARTERECTOMY  WITH VASCU GUARD PATCH  ANGIOPLASTY;  Surgeon: Serafina Mitchell, MD;  Location: [facility];  Service: Vascular;  Laterality: Right;  . EYE SURGERY Bilateral 2014   cataracts  . HAMMER TOE SURGERY  June 2007 & August 2008   left foot  . HAND ARTHROPLASTY  Oct 2010   right thumb  . Heart catherization  03/26/1999  . [place] SURGERY  Dec 1997 & Ampril 2005  . LUMBAR PERCUTANEOUS PEDICLE SCREW 4 LEVEL N/A 05/28/2014   Procedure: LUMBAR PERCUTANEOUS PEDICLE SCREW 4 LEVEL;  Surgeon: Erline Levine, MD;  Location: [facility];  Service: Neurosurgery;  Laterality: N/A;  . [place]   right foot  . PERIPHERAL VASCULAR CATHETERIZATION N/A 12/07/2016   Procedure: Abdominal Aortogram w/Lower Extremity;  Surgeon: Serafina Mitchell, MD;  Location: [facility] CV LAB;  Service: Cardiovascular;  Laterality: N/A;  . SPINE SURGERY  04/21/2004   Lower back disk gurgery- Lumbar stenosis  . TONSILLECTOMY  1965     Current Outpatient Medications  Medication Sig Dispense Refill  . albuterol (PROAIR HFA) 108 (90 Base) MCG/ACT inhaler Inhale 2 puffs into the lungs every 6 (six) hours as needed for wheezing or shortness of breath. 3 Inhaler 3  . aspirin 325 MG tablet Take 325 mg by mouth at bedtime.     Marland Kitchen b complex vitamins tablet Take 1 tablet by mouth daily.    . B Complex-Folic Acid (BENFOTIAMINE MULTI-B PO) Take 1 tablet by mouth 2 (two) times daily.     . bisoprolol (ZEBETA) 5 MG tablet TAKE 1 TABLET BY MOUTH  DAILY 90 tablet 1  . CINNAMON PO Take 2,000 mg by mouth every evening.     Marland Kitchen doxazosin (CARDURA) 2 MG tablet Take 2 mg by mouth every evening.     Marland Kitchen doxycycline (ADOXA) 50 MG tablet Take 50 mg by mouth every evening.     Marland Kitchen doxycycline (VIBRAMYCIN) 50 MG capsule     . empagliflozin (JARDIANCE) 10 MG TABS tablet Take 10 mg by mouth daily.    . fish oil-omega-3 fatty acids 1000 MG capsule Take 2 g by mouth daily.     . furosemide (LASIX) 20 MG tablet Take 20 mg by mouth 2 (two) times daily.     Marland Kitchen GARLIC PO Take 7,628 mg  by mouth daily.     Marland Kitchen glipiZIDE (GLUCOTROL) 10 MG tablet Take 10 mg by mouth daily before breakfast.    . isosorbide mononitrate (IMDUR) 30 MG 24 hr tablet TAKE 1 TABLET BY MOUTH  DAILY 90 tablet 1  . L-Lysine 1000 MG TABS Take 1,000 mg by mouth daily.     Marland Kitchen losartan (COZAAR) 100 MG tablet Take 100 mg by mouth daily.    . metFORMIN (GLUCOPHAGE) 1000 MG tablet Take 1,000 mg by mouth 2 (two) times daily with a meal.    . Multiple Vitamin (MULTIVITAMIN) tablet Take 1 tablet by mouth daily.    Marland Kitchen omega-3 acid ethyl esters (LOVAZA) 1 g capsule Take  by mouth.    . pentoxifylline (TRENTAL) 400 MG CR tablet TAKE 1 TABLET BY MOUTH  TWICE A DAY 180 tablet 2  . polyethylene glycol-electrolytes (NULYTELY/GOLYTELY) 420 g solution Take 4,000 mLs by mouth as directed. 4000 mL 0  . potassium chloride SA (K-DUR,KLOR-CON) 20 MEQ tablet Take 20 mEq by mouth 2 (two) times daily.    Marland Kitchen rOPINIRole (REQUIP) 1 MG tablet Take 1 mg by mouth at bedtime.    . simvastatin (ZOCOR) 40 MG tablet Take 40 mg by mouth every evening.    . sitaGLIPtin (JANUVIA) 100 MG tablet Take 100 mg by mouth daily.    Marland Kitchen SPIRIVA HANDIHALER 18 MCG inhalation capsule INHALE THE CONTENTS OF 1  CAPSULE VIA HANDIHALER  DAILY 90 capsule 3   Current Facility-Administered Medications  Medication Dose Route Frequency Provider Last Rate Last Dose  . 0.9 %  sodium chloride infusion  500 mL Intravenous Continuous Milus Banister, MD        Allergies:   Patient has no known allergies.   Social History:  The patient  reports that he quit smoking about 31 years ago. His smoking use included cigarettes. He has a 56.00 pack-year smoking history. He has never used smokeless tobacco. He reports current alcohol use. He reports that he does not use drugs.   Family History:  The patient's family history includes Cancer in his father and mother; Deep vein thrombosis in his mother; Diabetes in his mother; Heart attack in his father; Heart disease (age of onset: 20)  in his father; Hyperlipidemia in his father and mother; Hypertension in his father and mother.   ROS:  Please see the history of present illness.   All other systems are personally reviewed and negative.   Exam:  (Video/Tele Health Call; Exam is subjective and or/visual.) General:  Speaks in full sentences. No resp difficulty. Lungs: Normal respiratory effort with conversation.  Abdomen: Non-distended per patient report Extremities: Pt denies edema. Neuro: Alert & oriented x 3.   Recent Labs: No results found for requested labs within last 8760 hours.  Personally reviewed   Wt Readings from Last 3 Encounters:  04/16/19 108.9 kg (240 lb)  12/01/18 113.4 kg (250 lb)  06/19/18 112 kg (247 lb)      ASSESSMENT AND PLAN:  1) CAD - Last cath in [place] with stent. Myoview 2014 normal EF and LV wall motion . - Symptoms reminiscent of previous angina. We discussed options of stress testing, cath and cardiac CT. I think R/L cath most appropriate - Continue Statin, BB, + aspirin. 2) Chronic Diastolic HF-  EF 39-76% with mild RV dysfunction on ECHO 5/16. - NYHA III, Volume status appears stable - Continue lasix 20 mg BID - Will do RHC at same time as coronary angio - repeat echo at next visit 3) PAD - s/p femoral artery endarterectomy in 2015. Followed by [facility]. Per Dr [name] - still with 1-2 block claudication 4) HTN- Well controlled 5) OSA on CPAP- Continue nightly.  6)  Hyperlipidemia - Followed by Dr. Virgina Jock. Continue statin. Goal LDL < 70 7) COPD - Follows with Dr Halford Chessman  COVID screen The patient does not have any symptoms that suggest any further testing/ screening at this time.  Social distancing reinforced today.  Recommended follow-up:  As above  Relevant cardiac medications were reviewed at length with the patient today.   The patient does not have concerns regarding their medications at this time.   The following changes were  made today:  As above  Today, I have  spent 18 minutes with the patient with telehealth technology discussing the above issues .    Signed, Glori Bickers, MD  05/04/2019 9:29 AM  [facility] [address] [place] [place] [address] [phone] (office) [phone] (fax)

## 2019-05-04 NOTE — H&P (View-Only) (Signed)
Heart Failure TeleHealth Note  Due to national recommendations of social distancing due to [place] 19, Audio/video telehealth visit is felt to be most appropriate for this patient at this time.  See MyChart message from today for patient consent regarding telehealth for [facility].  Date:  05/04/2019   ID:  Anthony [name], DOB 1946-12-12, MRN [id number]  Location: Home  Provider location: [facility] Type of Visit: Established patient  PCP:  [name], MD  Cardiologist:  No primary care provider on file. Primary HF: [place]  Chief Complaint: Heart Failure follow-up   History of Present Illness:  Anthony [name] is a 73 y.o. male with h/o obesity, HTN, HL, severe osteoarthritis, DM2 (diagnosed 2009), OSA (intolerant of CPAP), former smoker (quit 1989), CAD s/p stent 2001 and PAD. He is the father-in-law of Dr. [name] in Interventional Radiology.    Had cath in [place] in 2001. Was told he had 70% blockage in one artery and the one in the back was totally blocked. Eventually underwent stenting of that arter. No caths since. Myoview 10/14: Normal. LV Ejection Fraction: 56%. LV Wall Motion: NL LV Function; NL Wall Motion  Has severe PAD R>L followed by Dr. [name]. Now status post right external iliac, common femoral, superficial femoral endarterectomy with bovine pericardial patch angioplasty on 10/10/2014  Saw Dr. Halford Chessman and placed on home O2 but now weaned off with inhalers. Checks sats with pulse oximeter  He presents via [employer], civil (consulting) for a telehealth visit today. Says he is having more exertional dyspnea over the past few months - similar . No CP or resting symptoms. Minimal edema. No orthopnea or PND. Weight stable at 247.  BP 140/74 HR 59. Uses CPAP qHS. Still having some claudication but stable. Has coloscopy scheduled for some rectal bleeding.    PFTs with moderate COPD FEV1 1.83 (62%) FVC   2.84 (66%) FEF 25-75% 0.79  (29%) DLCO 67  ECHO 10/13 EF 50-55%  ECHO 5/16: EF 45-50% mold to moderate RV dysfunction Grade II DD  CXR with elevated R hemidiaphragm. Sleep study with severe OS - AHI 39.       Anthony [name] denies symptoms worrisome for COVID 19.   Past Medical History:  Diagnosis Date  . Arthritis   . CAD (coronary artery disease)    stent placed 06/02/2000  . Chronic respiratory failure with hypoxia ([place]) 08/11/2012  . Congestive heart failure ([place])   . COPD (chronic obstructive pulmonary disease) ([place]) 08/11/2012  . Diabetes mellitus ([place])   . Hyperlipidemia   . Hypertension   . Lumbar stenosis   . OSA (obstructive sleep apnea) 08/11/2012   cpcap  . Peripheral vascular disease ([place])   . Pneumonia 1999  . Shortness of breath    with exertion   Past Surgical History:  Procedure Laterality Date  . ABDOMINAL ANGIOGRAM  Dec. 4, 2013  . ABDOMINAL AORTAGRAM N/A 11/29/2012   Procedure: ABDOMINAL Maxcine Ham;  Surgeon: Serafina Mitchell, MD;  Location: [facility] CATH LAB;  Service: Cardiovascular;  Laterality: N/A;  . ABDOMINAL AORTAGRAM N/A 09/03/2014   Procedure: ABDOMINAL AORTAGRAM;  Surgeon: Serafina Mitchell, MD;  Location: [facility] CATH LAB;  Service: Cardiovascular;  Laterality: N/A;  . ANGIOPLASTY  06/02/2000   Stent  . ANTERIOR LATERAL LUMBAR FUSION 4 LEVELS Right 05/28/2014   Procedure: Right L4-5 L3-4 L2-3, L1-2  Anterior lateral lumbar fusion with percutaneaous pedicle screws. Lumbar four/five, three/four, two/three and possible two/one;  Surgeon:  Erline Levine, MD;  Location: [facility] NEURO ORS;  Service: Neurosurgery;  Laterality: Right;  Lumbar One-Five Fusion with Percutaneous Screws  . [place]   right foot  . CARDIAC CATHETERIZATION    . CARPAL TUNNEL RELEASE  09/30/2006   right wrist  . CERVICAL FUSION  Nov 2002  . COLONOSCOPY W/ BIOPSIES AND POLYPECTOMY     benign  . ENDARTERECTOMY FEMORAL Right 10/10/2014   Procedure: RIGHT FEMORAL ARTERY ENDARTERECTOMY  WITH VASCU GUARD PATCH  ANGIOPLASTY;  Surgeon: Serafina Mitchell, MD;  Location: [place] [facility];  Service: Vascular;  Laterality: Right;  . EYE SURGERY Bilateral 2014   cataracts  . HAMMER TOE SURGERY  June 2007 & August 2008   left foot  . HAND ARTHROPLASTY  Oct 2010   right thumb  . Heart catherization  03/26/1999  . [place] SURGERY  Dec 1997 & Ampril 2005  . LUMBAR PERCUTANEOUS PEDICLE SCREW 4 LEVEL N/A 05/28/2014   Procedure: LUMBAR PERCUTANEOUS PEDICLE SCREW 4 LEVEL;  Surgeon: Erline Levine, MD;  Location: [place] NEURO ORS;  Service: Neurosurgery;  Laterality: N/A;  . [place]   right foot  . PERIPHERAL VASCULAR CATHETERIZATION N/A 12/07/2016   Procedure: Abdominal Aortogram w/Lower Extremity;  Surgeon: Serafina Mitchell, MD;  Location: [facility] CV LAB;  Service: Cardiovascular;  Laterality: N/A;  . SPINE SURGERY  04/21/2004   Lower back disk gurgery- Lumbar stenosis  . TONSILLECTOMY  1965     Current Outpatient Medications  Medication Sig Dispense Refill  . albuterol (PROAIR HFA) 108 (90 Base) MCG/ACT inhaler Inhale 2 puffs into the lungs every 6 (six) hours as needed for wheezing or shortness of breath. 3 Inhaler 3  . aspirin 325 MG tablet Take 325 mg by mouth at bedtime.     Marland Kitchen b complex vitamins tablet Take 1 tablet by mouth daily.    . B Complex-Folic Acid (BENFOTIAMINE MULTI-B PO) Take 1 tablet by mouth 2 (two) times daily.     . bisoprolol (ZEBETA) 5 MG tablet TAKE 1 TABLET BY MOUTH  DAILY 90 tablet 1  . CINNAMON PO Take 2,000 mg by mouth every evening.     Marland Kitchen doxazosin (CARDURA) 2 MG tablet Take 2 mg by mouth every evening.     Marland Kitchen doxycycline (ADOXA) 50 MG tablet Take 50 mg by mouth every evening.     Marland Kitchen doxycycline (VIBRAMYCIN) 50 MG capsule     . empagliflozin (JARDIANCE) 10 MG TABS tablet Take 10 mg by mouth daily.    . fish oil-omega-3 fatty acids 1000 MG capsule Take 2 g by mouth daily.     . furosemide (LASIX) 20 MG tablet Take 20 mg by mouth 2 (two) times daily.     Marland Kitchen GARLIC PO Take 3,419 mg  by mouth daily.     Marland Kitchen glipiZIDE (GLUCOTROL) 10 MG tablet Take 10 mg by mouth daily before breakfast.    . isosorbide mononitrate (IMDUR) 30 MG 24 hr tablet TAKE 1 TABLET BY MOUTH  DAILY 90 tablet 1  . L-Lysine 1000 MG TABS Take 1,000 mg by mouth daily.     Marland Kitchen losartan (COZAAR) 100 MG tablet Take 100 mg by mouth daily.    . metFORMIN (GLUCOPHAGE) 1000 MG tablet Take 1,000 mg by mouth 2 (two) times daily with a meal.    . Multiple Vitamin (MULTIVITAMIN) tablet Take 1 tablet by mouth daily.    Marland Kitchen omega-3 acid ethyl esters (LOVAZA) 1 g capsule Take  by mouth.    . pentoxifylline (TRENTAL) 400 MG CR tablet TAKE 1 TABLET BY MOUTH  TWICE A DAY 180 tablet 2  . polyethylene glycol-electrolytes (NULYTELY/GOLYTELY) 420 g solution Take 4,000 mLs by mouth as directed. 4000 mL 0  . potassium chloride SA (K-DUR,KLOR-CON) 20 MEQ tablet Take 20 mEq by mouth 2 (two) times daily.    Marland Kitchen rOPINIRole (REQUIP) 1 MG tablet Take 1 mg by mouth at bedtime.    . simvastatin (ZOCOR) 40 MG tablet Take 40 mg by mouth every evening.    . sitaGLIPtin (JANUVIA) 100 MG tablet Take 100 mg by mouth daily.    Marland Kitchen SPIRIVA HANDIHALER 18 MCG inhalation capsule INHALE THE CONTENTS OF 1  CAPSULE VIA HANDIHALER  DAILY 90 capsule 3   Current Facility-Administered Medications  Medication Dose Route Frequency Provider Last Rate Last Dose  . 0.9 %  sodium chloride infusion  500 mL Intravenous Continuous Milus Banister, MD        Allergies:   Patient has no known allergies.   Social History:  The patient  reports that he quit smoking about 31 years ago. His smoking use included cigarettes. He has a 56.00 pack-year smoking history. He has never used smokeless tobacco. He reports current alcohol use. He reports that he does not use drugs.   Family History:  The patient's family history includes Cancer in his father and mother; Deep vein thrombosis in his mother; Diabetes in his mother; Heart attack in his father; Heart disease (age of onset: 46)  in his father; Hyperlipidemia in his father and mother; Hypertension in his father and mother.   ROS:  Please see the history of present illness.   All other systems are personally reviewed and negative.   Exam:  (Video/Tele Health Call; Exam is subjective and or/visual.) General:  Speaks in full sentences. No resp difficulty. Lungs: Normal respiratory effort with conversation.  Abdomen: Non-distended per patient report Extremities: Pt denies edema. Neuro: Alert & oriented x 3.   Recent Labs: No results found for requested labs within last 8760 hours.  Personally reviewed   Wt Readings from Last 3 Encounters:  04/16/19 108.9 kg (240 lb)  12/01/18 113.4 kg (250 lb)  06/19/18 112 kg (247 lb)      ASSESSMENT AND PLAN:  1) CAD - Last cath in [place] with stent. Myoview 2014 normal EF and LV wall motion . - Symptoms reminiscent of previous angina. We discussed options of stress testing, cath and cardiac CT. I think R/L cath most appropriate - Continue Statin, BB, + aspirin. 2) Chronic Diastolic HF-  EF 17-61% with mild RV dysfunction on ECHO 5/16. - NYHA III, Volume status appears stable - Continue lasix 20 mg BID - Will do RHC at same time as coronary angio - repeat echo at next visit 3) PAD - s/p femoral artery endarterectomy in 2015. Followed by [facility]. Per Dr [name] - still with 1-2 block claudication 4) HTN- Well controlled 5) OSA on CPAP- Continue nightly.  6)  Hyperlipidemia - Followed by Dr. Virgina Jock. Continue statin. Goal LDL < 70 7) COPD - Follows with Dr Halford Chessman  COVID screen The patient does not have any symptoms that suggest any further testing/ screening at this time.  Social distancing reinforced today.  Recommended follow-up:  As above  Relevant cardiac medications were reviewed at length with the patient today.   The patient does not have concerns regarding their medications at this time.   The following changes were  made today:  As above  Today, I have  spent 18 minutes with the patient with telehealth technology discussing the above issues .    Signed, Glori Bickers, MD  05/04/2019 9:29 AM  [facility] [address] [place] [address] [phone] (office) [phone] (fax)

## 2019-05-07 ENCOUNTER — Encounter (HOSPITAL_COMMUNITY): Payer: Self-pay | Admitting: *Deleted

## 2019-05-07 ENCOUNTER — Telehealth: Payer: Self-pay | Admitting: *Deleted

## 2019-05-07 ENCOUNTER — Telehealth (HOSPITAL_COMMUNITY): Payer: Self-pay | Admitting: *Deleted

## 2019-05-07 DIAGNOSIS — I2510 Atherosclerotic heart disease of native coronary artery without angina pectoris: Secondary | ICD-10-CM

## 2019-05-07 DIAGNOSIS — R0609 Other forms of dyspnea: Secondary | ICD-10-CM

## 2019-05-07 DIAGNOSIS — R0600 Dyspnea, unspecified: Secondary | ICD-10-CM

## 2019-05-07 NOTE — Telephone Encounter (Signed)
Covid-19 travel screening questions  Have you traveled in the last 14 days? no If yes where?  Do you now or have you had a fever in the last 14 days? no  Do you have any respiratory symptoms of shortness of breath or cough now or in the last 14 days? no  Do you have any family members or close contacts with diagnosed or suspected Covid-19? No  Pt is aware that care partner will be waiting in car during procedure.  He will wear a mask into building

## 2019-05-07 NOTE — Telephone Encounter (Signed)
Spoke w/pt via phone regarding R/L HC sch for 5/15, pt had me on speaker phone with his wife so she could hear as well.  Pt will come by our office tomorrow for his pre-cath lab work.  He will go to [facility] for Covid testing on Wed, he verbalizes understanding of importance of self quarantine after this test.  All instructions, including med instructions reviewed w/pt, he and wife verbalize understanding.  Instruction Letter printed and left for pt to p/u tomorrow when he has labs drawn.

## 2019-05-07 NOTE — Addendum Note (Signed)
Encounter addended by: Scarlette Calico, RN on: 05/07/2019 4:42 PM  Actions taken: Order list changed, Diagnosis association updated

## 2019-05-08 ENCOUNTER — Telehealth: Payer: Self-pay | Admitting: *Deleted

## 2019-05-08 ENCOUNTER — Encounter: Payer: Self-pay | Admitting: Podiatry

## 2019-05-08 ENCOUNTER — Telehealth (HOSPITAL_COMMUNITY): Payer: Self-pay | Admitting: Vascular Surgery

## 2019-05-08 ENCOUNTER — Encounter: Payer: Medicare Other | Admitting: Gastroenterology

## 2019-05-08 ENCOUNTER — Ambulatory Visit (HOSPITAL_COMMUNITY)
Admission: RE | Admit: 2019-05-08 | Discharge: 2019-05-08 | Disposition: A | Discharge status: Home / Self Care | Payer: Medicare Other | Source: Ambulatory Visit | Attending: Cardiology | Admitting: Cardiology

## 2019-05-08 ENCOUNTER — Other Ambulatory Visit: Payer: Self-pay

## 2019-05-08 DIAGNOSIS — Z1159 Encounter for screening for other viral diseases: Secondary | ICD-10-CM | POA: Diagnosis not present

## 2019-05-08 DIAGNOSIS — R0609 Other forms of dyspnea: Secondary | ICD-10-CM | POA: Insufficient documentation

## 2019-05-08 DIAGNOSIS — R0600 Dyspnea, unspecified: Secondary | ICD-10-CM

## 2019-05-08 DIAGNOSIS — I2510 Atherosclerotic heart disease of native coronary artery without angina pectoris: Secondary | ICD-10-CM | POA: Insufficient documentation

## 2019-05-08 LAB — CBC
HCT: 48.6 % (ref 39.0–52.0)
Hemoglobin: 15.8 g/dL (ref 13.0–17.0)
MCH: 30.3 pg (ref 26.0–34.0)
MCHC: 32.5 g/dL (ref 30.0–36.0)
MCV: 93.3 fL (ref 80.0–100.0)
Platelets: 142 10*3/uL — ABNORMAL LOW (ref 150–400)
RBC: 5.21 MIL/uL (ref 4.22–5.81)
RDW: 14.3 % (ref 11.5–15.5)
WBC: 5.8 10*3/uL (ref 4.0–10.5)
nRBC: 0 % (ref 0.0–0.2)

## 2019-05-08 LAB — BASIC METABOLIC PANEL
Anion gap: 9 (ref 5–15)
BUN: 29 mg/dL — ABNORMAL HIGH (ref 8–23)
CO2: 24 mmol/L (ref 22–32)
Calcium: 9.4 mg/dL (ref 8.9–10.3)
Chloride: 103 mmol/L (ref 98–111)
Creatinine, Ser: 1.61 mg/dL — ABNORMAL HIGH (ref 0.61–1.24)
GFR calc Af Amer: 48 mL/min — ABNORMAL LOW (ref >60)
GFR calc non Af Amer: 42 mL/min — ABNORMAL LOW (ref >60)
Glucose, Bld: 254 mg/dL — ABNORMAL HIGH (ref 70–99)
Potassium: 4.6 mmol/L (ref 3.5–5.1)
Sodium: 136 mmol/L (ref 135–145)

## 2019-05-08 LAB — PROTIME-INR
INR: 1.1 (ref 0.8–1.2)
Prothrombin Time: 13.6 seconds (ref 11.4–15.2)

## 2019-05-08 NOTE — Telephone Encounter (Signed)
Elizabeth Palau CRNA notified me after speaking with Dr. Ardis Hughs this am that the patient has an upcoming [place] this Friday 05/11/19 and his procedure today will need to be postponed until after the LHC. I was unable to speak with the patient this morning but left a message for him to call back. SM

## 2019-05-08 NOTE — Telephone Encounter (Signed)
Pt left message on scheduling line wanting some one to call him about Heart Cath on Friday

## 2019-05-08 NOTE — Progress Notes (Addendum)
Subjective: Anthony [name] presents to clinic for preventative diabetic foot care on today. He has elongated, painful, discolored, thick toenails and calluses which interfere with activities of daily living.   [name] states he developed blisters when he was vacationing in [place]. He wore a pair of Crocs and suspects the side slots caused friction to his great toes. He discontinued wearing them when he discovered the blisters. The blisters have now resolved.   Pt states his right great toe nailplate spontaneously fell off and is growing back.  [name], MD is his PCP. Last visit was in December 2019.   Current Outpatient Medications:  .  albuterol (PROAIR HFA) 108 (90 Base) MCG/ACT inhaler, Inhale 2 puffs into the lungs every 6 (six) hours as needed for wheezing or shortness of breath., Disp: 3 Inhaler, Rfl: 3 .  aspirin 325 MG tablet, Take 325 mg by mouth at bedtime. , Disp: , Rfl:  .  B Complex-Folic Acid (BENFOTIAMINE MULTI-B PO), Take 1 tablet by mouth 2 (two) times daily. , Disp: , Rfl:  .  bisoprolol (ZEBETA) 5 MG tablet, TAKE 1 TABLET BY MOUTH  DAILY (Patient taking differently: Take 5 mg by mouth every evening. ), Disp: 90 tablet, Rfl: 1 .  CINNAMON PO, Take 2,000 mg by mouth every evening. , Disp: , Rfl:  .  doxazosin (CARDURA) 2 MG tablet, Take 2 mg by mouth every evening. , Disp: , Rfl:  .  doxycycline (ADOXA) 50 MG tablet, Take 50 mg by mouth daily at 2 PM. , Disp: , Rfl:  .  empagliflozin (JARDIANCE) 10 MG TABS tablet, Take 10 mg by mouth daily., Disp: , Rfl:  .  fish oil-omega-3 fatty acids 1000 MG capsule, Take 3 g by mouth daily. , Disp: , Rfl:  .  furosemide (LASIX) 20 MG tablet, Take 20 mg by mouth 2 (two) times daily. , Disp: , Rfl:  .  GARLIC PO, Take 5,643 mg by mouth daily. , Disp: , Rfl:  .  isosorbide mononitrate (IMDUR) 30 MG 24 hr tablet, TAKE 1 TABLET BY MOUTH  DAILY (Patient taking differently: Take 30 mg by mouth daily. ), Disp: 90 tablet, Rfl: 1 .   L-Lysine 1000 MG TABS, Take 1,000 mg by mouth daily. , Disp: , Rfl:  .  losartan (COZAAR) 100 MG tablet, Take 100 mg by mouth daily., Disp: , Rfl:  .  metFORMIN (GLUCOPHAGE) 1000 MG tablet, Take 1,000 mg by mouth 2 (two) times daily with a meal., Disp: , Rfl:  .  Multiple Vitamin (MULTIVITAMIN) tablet, Take 1 tablet by mouth daily., Disp: , Rfl:  .  pentoxifylline (TRENTAL) 400 MG CR tablet, TAKE 1 TABLET BY MOUTH  TWICE A DAY (Patient taking differently: Take 400 mg by mouth 2 (two) times a day. ), Disp: 180 tablet, Rfl: 2 .  polyethylene glycol-electrolytes (NULYTELY/GOLYTELY) 420 g solution, Take 4,000 mLs by mouth as directed., Disp: 4000 mL, Rfl: 0 .  potassium chloride SA (K-DUR,KLOR-CON) 20 MEQ tablet, Take 20 mEq by mouth 2 (two) times daily., Disp: , Rfl:  .  rOPINIRole (REQUIP) 1 MG tablet, Take 1 mg by mouth every evening. , Disp: , Rfl:  .  simvastatin (ZOCOR) 40 MG tablet, Take 40 mg by mouth every evening., Disp: , Rfl:  .  sitaGLIPtin (JANUVIA) 100 MG tablet, Take 100 mg by mouth daily., Disp: , Rfl:  .  SPIRIVA HANDIHALER 18 MCG inhalation capsule, INHALE THE CONTENTS OF 1  CAPSULE VIA HANDIHALER  DAILY (  Patient taking differently: Place 18 mcg into inhaler and inhale daily. ), Disp: 90 capsule, Rfl: 3 .  diclofenac sodium (VOLTAREN) 1 % GEL, Apply 1 application topically daily as needed (back pain)., Disp: , Rfl:  .  glipiZIDE (GLUCOTROL XL) 10 MG 24 hr tablet, Take 10 mg by mouth daily with breakfast., Disp: , Rfl:  .  SUPER B COMPLEX/C PO, Take 1 tablet by mouth daily., Disp: , Rfl:   Current Facility-Administered Medications:  .  0.9 %  sodium chloride infusion, 500 mL, Intravenous, Continuous, Milus Banister, MD  No Known Allergies  Vascular Examination: Capillary refill time < 3 seconds x 10 digits.  Dorsalis pedis pulses faintly palpable b/l.  Posterior tibial pulses palpable on the right; nonpalpable on the left.  Digital hair absent x 10 digits.  Skin  temperature gradient WNL b/l.  Dermatological Examination: Skin is thin, shiny and atrophic b/l.  Toenails 3-5 left,  3, 4 5 right discolored, thick, dystrophic with subungual debris and pain with palpation to nailbeds due to thickness of nails.  Anonychia secondary to permanent total nail avulsion left hallux and b/l 2nd digits. Nailbeds remain intact to these digits.  Anonychia right great toe. Nailbed intact. No erythema, no edema, no drainage.  Hyperkeratotic lesion distal tip right 3rd digit, submet head 1 b/l. No erythema, no edema, no drainage, no flocculence noted.   Musculoskeletal: Muscle strength 5/5 to all LE muscle groups.  Hammertoes 2-5 b/l.   Neurological: Sensation diminished with 10 gram monofilament b/l.  Vibratory sensation diminished b/l.  Assessment: 1. Painful onychomycosis toenails 3-5 left, 3, 4, 5 right 2. Calluses submet head 1 b/l 3. Corn distal tip right 3rd digit 4. NIDDM with PAD and diabetic neuropathy  Plan: 1. Continue diabetic foot care principles. Literature dispensed on today. 2. Toenails 3-5 left;  3, 4, 5 right were debrided in length and girth without iatrogenic bleeding. 3. Calluses pared submetatarsal head(s) 1 b/l utilizing sterile scalpel blade without incident. Corn(s) pared distal tip right 3rd digit utilizing sterile scalpel blade without incident.  4. Patient to continue soft, supportive shoe gear 5. Patient to report any pedal injuries to medical professional  6. Follow up 3 months.  7. Patient/POA to call should there be a concern in the interim.

## 2019-05-08 NOTE — Telephone Encounter (Signed)
Spoke w/pt, he just wanted to let us know colonoscopy sch for today has been cancelled until after his cath.

## 2019-05-08 NOTE — Telephone Encounter (Signed)
Spoke with patient and he is aware. Pt scheduled for OV 06/06/19@8 :30am. Pt aware of appt.

## 2019-05-08 NOTE — Telephone Encounter (Signed)
Thanks.    Patty, Can you please reach out to him.  He does not need to complete the rest of his colonoscopy prep this morning.  He saw cardiology last week and they felt he was having angina and he is scheduled for right and left heart catheterizations later this week.  I do not think it is safe to proceed with his endoscopic testing for now.  Can you please schedule him for office appointment with me in 4 weeks to follow-up on this, hopefully in person but telemedicine if needed.

## 2019-05-09 ENCOUNTER — Other Ambulatory Visit (HOSPITAL_COMMUNITY)
Admission: RE | Admit: 2019-05-09 | Discharge: 2019-05-09 | Disposition: A | Discharge status: Home / Self Care | Payer: Medicare Other | Source: Ambulatory Visit | Attending: Internal Medicine | Admitting: Internal Medicine

## 2019-05-09 DIAGNOSIS — Z1159 Encounter for screening for other viral diseases: Secondary | ICD-10-CM | POA: Insufficient documentation

## 2019-05-09 LAB — SARS CORONAVIRUS 2 BY RT PCR (HOSPITAL ORDER, PERFORMED IN ~~LOC~~ HOSPITAL LAB): SARS Coronavirus 2: NEGATIVE

## 2019-05-11 ENCOUNTER — Other Ambulatory Visit: Payer: Self-pay

## 2019-05-11 ENCOUNTER — Ambulatory Visit (HOSPITAL_COMMUNITY)
Admission: RE | Admit: 2019-05-11 | Discharge: 2019-05-11 | Disposition: A | Discharge status: Home / Self Care | Payer: Medicare Other | Attending: Internal Medicine | Admitting: Internal Medicine

## 2019-05-11 ENCOUNTER — Ambulatory Visit (HOSPITAL_COMMUNITY)
Admission: RE | Disposition: A | Discharge status: Home / Self Care | Payer: Self-pay | Source: Home / Self Care | Attending: Internal Medicine

## 2019-05-11 DIAGNOSIS — M48061 Spinal stenosis, lumbar region without neurogenic claudication: Secondary | ICD-10-CM | POA: Diagnosis not present

## 2019-05-11 DIAGNOSIS — R0609 Other forms of dyspnea: Secondary | ICD-10-CM

## 2019-05-11 DIAGNOSIS — J449 Chronic obstructive pulmonary disease, unspecified: Secondary | ICD-10-CM | POA: Diagnosis not present

## 2019-05-11 DIAGNOSIS — I25110 Atherosclerotic heart disease of native coronary artery with unstable angina pectoris: Secondary | ICD-10-CM | POA: Insufficient documentation

## 2019-05-11 DIAGNOSIS — Z87891 Personal history of nicotine dependence: Secondary | ICD-10-CM | POA: Insufficient documentation

## 2019-05-11 DIAGNOSIS — Z8249 Family history of ischemic heart disease and other diseases of the circulatory system: Secondary | ICD-10-CM | POA: Insufficient documentation

## 2019-05-11 DIAGNOSIS — I200 Unstable angina: Secondary | ICD-10-CM

## 2019-05-11 DIAGNOSIS — I5032 Chronic diastolic (congestive) heart failure: Secondary | ICD-10-CM | POA: Diagnosis not present

## 2019-05-11 DIAGNOSIS — G4733 Obstructive sleep apnea (adult) (pediatric): Secondary | ICD-10-CM | POA: Insufficient documentation

## 2019-05-11 DIAGNOSIS — E785 Hyperlipidemia, unspecified: Secondary | ICD-10-CM | POA: Diagnosis not present

## 2019-05-11 DIAGNOSIS — E1151 Type 2 diabetes mellitus with diabetic peripheral angiopathy without gangrene: Secondary | ICD-10-CM | POA: Diagnosis not present

## 2019-05-11 DIAGNOSIS — I110 Hypertensive heart disease with heart failure: Secondary | ICD-10-CM | POA: Insufficient documentation

## 2019-05-11 DIAGNOSIS — R0600 Dyspnea, unspecified: Secondary | ICD-10-CM

## 2019-05-11 DIAGNOSIS — I2510 Atherosclerotic heart disease of native coronary artery without angina pectoris: Secondary | ICD-10-CM

## 2019-05-11 DIAGNOSIS — Z955 Presence of coronary angioplasty implant and graft: Secondary | ICD-10-CM

## 2019-05-11 HISTORY — PX: RIGHT/LEFT HEART CATH AND CORONARY ANGIOGRAPHY: CATH118266

## 2019-05-11 HISTORY — PX: CORONARY STENT INTERVENTION: CATH118234

## 2019-05-11 LAB — POCT I-STAT 7, (LYTES, BLD GAS, ICA,H+H)
Acid-base deficit: 5 mmol/L — ABNORMAL HIGH (ref 0.0–2.0)
Bicarbonate: 21 mmol/L (ref 20.0–28.0)
Calcium, Ion: 1.19 mmol/L (ref 1.15–1.40)
HCT: 40 % (ref 39.0–52.0)
Hemoglobin: 13.6 g/dL (ref 13.0–17.0)
O2 Saturation: 98 %
Potassium: 4.4 mmol/L (ref 3.5–5.1)
Sodium: 139 mmol/L (ref 135–145)
TCO2: 22 mmol/L (ref 22–32)
pCO2 arterial: 42.8 mmHg (ref 32.0–48.0)
pH, Arterial: 7.299 — ABNORMAL LOW (ref 7.350–7.450)
pO2, Arterial: 119 mmHg — ABNORMAL HIGH (ref 83.0–108.0)

## 2019-05-11 LAB — POCT I-STAT EG7
Acid-base deficit: 4 mmol/L — ABNORMAL HIGH (ref 0.0–2.0)
Acid-base deficit: 5 mmol/L — ABNORMAL HIGH (ref 0.0–2.0)
Bicarbonate: 22.7 mmol/L (ref 20.0–28.0)
Bicarbonate: 23.8 mmol/L (ref 20.0–28.0)
Calcium, Ion: 1.25 mmol/L (ref 1.15–1.40)
Calcium, Ion: 1.28 mmol/L (ref 1.15–1.40)
HCT: 40 % (ref 39.0–52.0)
HCT: 42 % (ref 39.0–52.0)
Hemoglobin: 13.6 g/dL (ref 13.0–17.0)
Hemoglobin: 14.3 g/dL (ref 13.0–17.0)
O2 Saturation: 69 %
O2 Saturation: 71 %
Potassium: 4.6 mmol/L (ref 3.5–5.1)
Potassium: 4.9 mmol/L (ref 3.5–5.1)
Sodium: 137 mmol/L (ref 135–145)
Sodium: 138 mmol/L (ref 135–145)
TCO2: 24 mmol/L (ref 22–32)
TCO2: 25 mmol/L (ref 22–32)
pCO2, Ven: 50.3 mmHg (ref 44.0–60.0)
pCO2, Ven: 51.9 mmHg (ref 44.0–60.0)
pH, Ven: 7.262 (ref 7.250–7.430)
pH, Ven: 7.27 (ref 7.250–7.430)
pO2, Ven: 41 mmHg (ref 32.0–45.0)
pO2, Ven: 43 mmHg (ref 32.0–45.0)

## 2019-05-11 LAB — GLUCOSE, CAPILLARY: Glucose-Capillary: 262 mg/dL — ABNORMAL HIGH (ref 70–99)

## 2019-05-11 LAB — POCT ACTIVATED CLOTTING TIME
Activated Clotting Time: 257 seconds
Activated Clotting Time: 279 seconds
Activated Clotting Time: >1000 seconds

## 2019-05-11 SURGERY — RIGHT/LEFT HEART CATH AND CORONARY ANGIOGRAPHY
Anesthesia: LOCAL

## 2019-05-11 MED ORDER — SODIUM CHLORIDE 0.9 % IV SOLN: 250.0000 mL | INTRAVENOUS | Status: DC | PRN

## 2019-05-11 MED ORDER — CLOPIDOGREL BISULFATE 300 MG PO TABS
ORAL_TABLET | ORAL | Status: AC
  Filled 2019-05-11: qty 1

## 2019-05-11 MED ORDER — LABETALOL HCL 5 MG/ML IV SOLN: 10.0000 mg | INTRAVENOUS | Status: DC | PRN

## 2019-05-11 MED ORDER — IOHEXOL 350 MG/ML SOLN
INTRAVENOUS | Status: DC | PRN
  Administered 2019-05-11: 65 mL via INTRACARDIAC

## 2019-05-11 MED ORDER — CLOPIDOGREL BISULFATE 75 MG PO TABS: 75.0000 mg | ORAL_TABLET | Freq: Every day | ORAL | 11 refills | Status: DC

## 2019-05-11 MED ORDER — BISOPROLOL FUMARATE 5 MG PO TABS: 5.0000 mg | ORAL_TABLET | Freq: Every evening | ORAL | Status: DC

## 2019-05-11 MED ORDER — LIDOCAINE HCL (PF) 1 % IJ SOLN
INTRAMUSCULAR | Status: AC
  Filled 2019-05-11: qty 30

## 2019-05-11 MED ORDER — HEPARIN (PORCINE) IN NACL 1000-0.9 UT/500ML-% IV SOLN
INTRAVENOUS | Status: AC
  Filled 2019-05-11: qty 1000

## 2019-05-11 MED ORDER — SODIUM CHLORIDE 0.9% FLUSH: 3.0000 mL | Freq: Two times a day (BID) | INTRAVENOUS | Status: DC

## 2019-05-11 MED ORDER — CLOPIDOGREL BISULFATE 300 MG PO TABS
ORAL_TABLET | ORAL | Status: DC | PRN
  Administered 2019-05-11: 600 mg via ORAL

## 2019-05-11 MED ORDER — ISOSORBIDE MONONITRATE ER 30 MG PO TB24: 30.0000 mg | ORAL_TABLET | Freq: Every day | ORAL | Status: DC

## 2019-05-11 MED ORDER — SODIUM CHLORIDE 0.9 % IV SOLN: INTRAVENOUS | Status: DC

## 2019-05-11 MED ORDER — FENTANYL CITRATE (PF) 100 MCG/2ML IJ SOLN
INTRAMUSCULAR | Status: AC
  Filled 2019-05-11: qty 2

## 2019-05-11 MED ORDER — ROPINIROLE HCL 1 MG PO TABS: 1.0000 mg | ORAL_TABLET | Freq: Every evening | ORAL | Status: DC

## 2019-05-11 MED ORDER — SODIUM CHLORIDE 0.9% FLUSH: 3.0000 mL | INTRAVENOUS | Status: DC | PRN

## 2019-05-11 MED ORDER — FUROSEMIDE 20 MG PO TABS: 20.0000 mg | ORAL_TABLET | Freq: Two times a day (BID) | ORAL | Status: DC

## 2019-05-11 MED ORDER — HEPARIN (PORCINE) IN NACL 1000-0.9 UT/500ML-% IV SOLN
INTRAVENOUS | Status: DC | PRN
  Administered 2019-05-11 (×2): 500 mL

## 2019-05-11 MED ORDER — ALBUTEROL SULFATE HFA 108 (90 BASE) MCG/ACT IN AERS: 2.0000 | INHALATION_SPRAY | Freq: Four times a day (QID) | RESPIRATORY_TRACT | Status: DC | PRN

## 2019-05-11 MED ORDER — DOXAZOSIN MESYLATE 2 MG PO TABS: 2.0000 mg | ORAL_TABLET | Freq: Every evening | ORAL | Status: DC

## 2019-05-11 MED ORDER — HYDRALAZINE HCL 20 MG/ML IJ SOLN: 10.0000 mg | INTRAMUSCULAR | Status: DC | PRN

## 2019-05-11 MED ORDER — SIMVASTATIN 40 MG PO TABS: 40.0000 mg | ORAL_TABLET | Freq: Every evening | ORAL | Status: DC

## 2019-05-11 MED ORDER — HEPARIN SODIUM (PORCINE) 1000 UNIT/ML IJ SOLN
INTRAMUSCULAR | Status: DC | PRN
  Administered 2019-05-11: 6000 [IU] via INTRAVENOUS
  Administered 2019-05-11: 3000 [IU] via INTRAVENOUS
  Administered 2019-05-11: 5500 [IU] via INTRAVENOUS
  Administered 2019-05-11: 2000 [IU] via INTRAVENOUS

## 2019-05-11 MED ORDER — LOSARTAN POTASSIUM 50 MG PO TABS: 100.0000 mg | ORAL_TABLET | Freq: Every day | ORAL | Status: DC

## 2019-05-11 MED ORDER — NITROGLYCERIN 1 MG/10 ML FOR IR/CATH LAB
INTRA_ARTERIAL | Status: AC
  Filled 2019-05-11: qty 10

## 2019-05-11 MED ORDER — ASPIRIN 81 MG PO TABS: 81.0000 mg | ORAL_TABLET | Freq: Every day | ORAL | 1 refills | Status: DC

## 2019-05-11 MED ORDER — ASPIRIN 81 MG PO CHEW
CHEWABLE_TABLET | ORAL | Status: AC
  Administered 2019-05-11: 81 mg via ORAL
  Filled 2019-05-11: qty 1

## 2019-05-11 MED ORDER — ASPIRIN 81 MG PO CHEW: 81.0000 mg | CHEWABLE_TABLET | Freq: Every day | ORAL | Status: DC

## 2019-05-11 MED ORDER — CLOPIDOGREL BISULFATE 75 MG PO TABS: 75.0000 mg | ORAL_TABLET | Freq: Every day | ORAL | Status: DC

## 2019-05-11 MED ORDER — MIDAZOLAM HCL 2 MG/2ML IJ SOLN
INTRAMUSCULAR | Status: DC | PRN
  Administered 2019-05-11 (×4): 1 mg via INTRAVENOUS

## 2019-05-11 MED ORDER — FENTANYL CITRATE (PF) 100 MCG/2ML IJ SOLN
INTRAMUSCULAR | Status: DC | PRN
  Administered 2019-05-11 (×3): 25 ug via INTRAVENOUS

## 2019-05-11 MED ORDER — SODIUM CHLORIDE 0.9 % IV SOLN
INTRAVENOUS | Status: DC
  Administered 2019-05-11: 06:00:00 via INTRAVENOUS

## 2019-05-11 MED ORDER — DOXYCYCLINE MONOHYDRATE 100 MG PO TABS: 50.0000 mg | ORAL_TABLET | Freq: Every day | ORAL | Status: DC

## 2019-05-11 MED ORDER — LIDOCAINE HCL (PF) 1 % IJ SOLN
INTRAMUSCULAR | Status: DC | PRN
  Administered 2019-05-11 (×2): 2 mL

## 2019-05-11 MED ORDER — MIDAZOLAM HCL 2 MG/2ML IJ SOLN
INTRAMUSCULAR | Status: AC
  Filled 2019-05-11: qty 2

## 2019-05-11 MED ORDER — HEPARIN SODIUM (PORCINE) 1000 UNIT/ML IJ SOLN
INTRAMUSCULAR | Status: AC
  Filled 2019-05-11: qty 1

## 2019-05-11 MED ORDER — ASPIRIN 81 MG PO CHEW
81.0000 mg | CHEWABLE_TABLET | ORAL | Status: AC
  Administered 2019-05-11: 81 mg via ORAL

## 2019-05-11 MED ORDER — ACETAMINOPHEN 325 MG PO TABS: 650.0000 mg | ORAL_TABLET | ORAL | Status: DC | PRN

## 2019-05-11 MED ORDER — VERAPAMIL HCL 2.5 MG/ML IV SOLN
INTRAVENOUS | Status: AC
  Filled 2019-05-11: qty 2

## 2019-05-11 MED ORDER — POTASSIUM CHLORIDE CRYS ER 20 MEQ PO TBCR: 20.0000 meq | EXTENDED_RELEASE_TABLET | Freq: Two times a day (BID) | ORAL | Status: DC

## 2019-05-11 MED ORDER — VERAPAMIL HCL 2.5 MG/ML IV SOLN
INTRAVENOUS | Status: DC | PRN
  Administered 2019-05-11: 10 mL via INTRA_ARTERIAL

## 2019-05-11 MED ORDER — ONDANSETRON HCL 4 MG/2ML IJ SOLN: 4.0000 mg | Freq: Four times a day (QID) | INTRAMUSCULAR | Status: DC | PRN

## 2019-05-11 MED ORDER — IOHEXOL 350 MG/ML SOLN
INTRAVENOUS | Status: DC | PRN
  Administered 2019-05-11: 75 mL via INTRACARDIAC

## 2019-05-11 MED FILL — ASPIRIN LOW DOSE 81 MG TBEC: 81 | 90 days supply | Qty: 90 | Fill #0 | Status: TO

## 2019-05-11 MED FILL — CLOPIDOGREL 75 MG TABLET: 75 | 30 days supply | Qty: 30 | Fill #0 | Status: TO

## 2019-05-11 SURGICAL SUPPLY — 26 items
BALLN SAPPHIRE 2.0X12 (BALLOONS) ×2
BALLN SAPPHIRE ~~LOC~~ 3.25X15 (BALLOONS) ×1 IMPLANT
BALLOON SAPPHIRE 2.0X12 (BALLOONS) IMPLANT
CATH BALLN WEDGE 5F 110CM (CATHETERS) ×1 IMPLANT
CATH IMPULSE 5F ANG/FL3.5 (CATHETERS) ×1 IMPLANT
CATH LAUNCHER 5F JL4 (CATHETERS) IMPLANT
CATH VISTA GUIDE 6FR AL1 (CATHETERS) ×1 IMPLANT
CATH VISTA GUIDE 6FR JL4 (CATHETERS) ×1 IMPLANT
CATH VISTA GUIDE 6FR XBLAD3.5 (CATHETERS) ×1 IMPLANT
CATH VISTA GUIDE 6FR XBLAD4 (CATHETERS) ×1 IMPLANT
CATHETER LAUNCHER 5F JL4 (CATHETERS) ×2
DEVICE RAD COMP TR BAND LRG (VASCULAR PRODUCTS) ×1 IMPLANT
GLIDESHEATH SLEND SS 6F .021 (SHEATH) ×1 IMPLANT
GUIDEWIRE .025 260CM (WIRE) ×1 IMPLANT
GUIDEWIRE INQWIRE 1.5J.035X260 (WIRE) IMPLANT
INQWIRE 1.5J .035X260CM (WIRE) ×2
KIT ENCORE 26 ADVANTAGE (KITS) ×1 IMPLANT
KIT HEART LEFT (KITS) ×2 IMPLANT
PACK CARDIAC CATHETERIZATION (CUSTOM PROCEDURE TRAY) ×2 IMPLANT
SHEATH GLIDE SLENDER 4/5FR (SHEATH) ×1 IMPLANT
STENT SYNERGY DES 3X20 (Permanent Stent) ×1 IMPLANT
TRANSDUCER W/STOPCOCK (MISCELLANEOUS) ×2 IMPLANT
TUBING CIL FLEX 10 FLL-RA (TUBING) ×2 IMPLANT
WIRE COUGAR XT STRL 190CM (WIRE) ×1 IMPLANT
WIRE EMERALD 3MM-J .025X260CM (WIRE) ×1 IMPLANT
WIRE HI TORQ WHISPER MS 190CM (WIRE) ×1 IMPLANT

## 2019-05-11 NOTE — Progress Notes (Signed)
Cardiac Rehab Advisory Cardiac Rehab Phase I is not seeing pts face to face at this time due to Covid 19 restrictions. Ambulation is occurring through nursing, PT, and mobility teams. We will help facilitate that process as needed. We are calling pts in their rooms and discussing education. We will then deliver education materials to pts RN for delivery to pt.    PCI/stent education completed with patient including restrictions, Plavix use, CP, NTG, and calling 911, risk factor modification, diabetic and heart healthy diet given, and exercise guidelines including temperature precautions, use of RPE scale, and endpoints for exercise. Pt states walking limited because of "trouble with back and legs". Pt does not use assistive device with walking. Discussed phase 2 cardiac rehab and temporary department closure due to COVID-19. Pt verbalizes understanding, referral sent to phase 2 cardiac rehab program at [facility].  Sol Passer, MS, ACSM CEP [phone]

## 2019-05-11 NOTE — Progress Notes (Signed)
TRB REMOVAL  LOCATION:    right radial  DEFLATED PER PROTOCOL:    Yes.    TIME BAND OFF / DRESSING APPLIED:    1350   SITE UPON ARRIVAL:    Level 0  SITE AFTER BAND REMOVAL:    Level 0  CIRCULATION SENSATION AND MOVEMENT:    Within Normal Limits   Yes.    COMMENTS:   Tegaderm dsg applied

## 2019-05-11 NOTE — Interval H&P Note (Signed)
History and Physical Interval Note:  05/11/2019 7:42 AM  Anthony [name]  has presented today for surgery, with the diagnosis of dyspnea on exertion.  The various methods of treatment have been discussed with the patient and family. After consideration of risks, benefits and other options for treatment, the patient has consented to  Procedure(s): RIGHT/LEFT HEART CATH AND CORONARY ANGIOGRAPHY (N/A) and possible coronary angioplasty as a surgical intervention.  The patient's history has been reviewed, patient examined, no change in status, stable for surgery.  I have reviewed the patient's chart and labs.  Questions were answered to the patient's satisfaction.     Anthony [name]

## 2019-05-11 NOTE — Discharge Summary (Addendum)
Discharge Summary    Patient ID: Anthony [name] MRN: [id number]; DOB: [birth year]  Admit date: 05/11/2019 Discharge date: 05/11/2019  Primary Care Provider: [name], MD  Primary Cardiologist/HF: Dr. [name]  Discharge Diagnoses    Active Problems:   Unstable angina (HCC)   HTN   HLD   DM   CAD   OSA Allergies No Known Allergies  Diagnostic Studies/Procedures    RIGHT/LEFT HEART CATH AND CORONARY ANGIOGRAPHY  Conclusion     Prox RCA lesion is 100% stenosed.  Prox LAD lesion is 90% stenosed.  Mid LAD lesion is 100% stenosed.   Findings:  Ao = 89/49 (65) LV = 88/6 RA = 4  RV = 27/5 PA = 28/4 (15) PCW = 13 Fick cardiac output/index = 4.9/2.2 PVR = < 1.0 WU Ao sat = 98% PA sat = 69%, 70%  Assessment: 1. Left dominant coronary system with 2V CAD 2. Small dominant RCA with chronic proximal total occlusion 3. Widely patent dominant LCX 4. High grade 95% proximal LAD likely within or just prior to previous stent. Probable chronic occlusion of distal LAD but not seen well 5. LVEF 60-65% with normal filling pressures 6. Normal right heart pressures   Plan/Discussion:  Plan PCI LAD.   Glori Bickers, MD  8:48 AM   CORONARY STENT INTERVENTION   Conclusion   1. Severe stenosis proximal LAD with chronic occlusion of the distal LAD. High grade stenosis in the distal Diagonal branch which arises just before the occluded LAD.  2. Successful angioplasty of the Diagonal branch (balloon only( 3. Successful PTCA/DES x 1 proximal LAD  Recommendations: DAPT with ASA and Plavix for at least 6 months but longer if well tolerated.        History of Present Illness     Anthony [name] Gordonis a 73 y.o.malewith h/o obesity, HTN, HL, severe osteoarthritis, DM2 (diagnosed 2009), OSA (intolerant of CPAP), former smoker (quit 1989), CAD s/p stent 2001 and PAD presented for outpatient cath.  Had cath in [place] in 2001. Was told he had 70% blockage in one  artery and the one in the back was totally blocked. Eventually underwent stenting of that arter. No caths since. Myoview 10/14: Normal. LV Ejection Fraction: 56%. LV Wall Motion: NL LV Function; NL Wall Motion. EF 45-50% with mild RV dysfunction on ECHO 5/16.  Has severe PAD R>L followed by Dr. [name]. Now status post right external iliac, common femoral, superficial femoral endarterectomy with bovine pericardial patch angioplasty on 10/10/2014  Saw Dr. Halford Chessman and placed on home O2 but now weaned off with inhalers. Checks sats with pulse oximeter.  Seen by Dr. [name] 05/04/2019 via audio/video conferencing for DOE x several months. Recommended R/L cath for further evaluation.   Hospital Course     Consultants: None  Cath showed 60-65% with normal filling and right heart pressure. Severe stenosis proximal LAD with chronic occlusion of the distal LAD. High grade stenosis in the distal Diagonal branch which arises just before the occluded LAD. S/p Successful angioplasty of the Diagonal branch (balloon only) and  PTCA/DES x 1 proximal LAD.  Started on DAPT with ASA and Plavix. Seen by cardiac rehab virtually. Felt stable for same day PCI.  The patient been seen by Dr. [name] today and deemed ready for discharge home. All follow-up appointments have been scheduled. Discharge medications are listed below.   Discharge Vitals Blood pressure 131/62, pulse 64, temperature 97.8 F (36.6 C), temperature source Oral, resp. rate 12, height 5\' 9"  (1.753  m), weight 108.9 kg, SpO2 98 %.  Filed Weights   05/11/19 0549  Weight: 108.9 kg    Labs & Radiologic Studies    CBC No results for input(s): WBC, NEUTROABS, HGB, HCT, MCV, PLT in the last 72 hours. Basic Metabolic Panel No results for input(s): NA, K, CL, CO2, GLUCOSE, BUN, CREATININE, CALCIUM, MG, PHOS in the last 72 hours.   Disposition   Pt is being discharged home today in good condition.  Follow-up Plans & Appointments    Follow-up  Information    Lakesha Levinson, Shaune Pascal, MD Follow up.   Specialty:  Cardiology Why:  Office will call with time and date of appointment  Contact information: [address] [place] [address] [phone]          Discharge Instructions    Amb Referral to Cardiac Rehabilitation   Complete by:  As directed    Diagnosis:  Coronary Stents   After initial evaluation and assessments completed: Virtual Based Care may be provided alone or in conjunction with Phase 2 Cardiac Rehab based on patient barriers.:  Yes   Diet - low sodium heart healthy   Complete by:  As directed    Discharge instructions   Complete by:  As directed    No driving for 2 days. No lifting over 5 lbs for 1 week. No sexual activity for 1 week. Keep procedure site clean & dry. If you notice increased pain, swelling, bleeding or pus, call/return!  You may shower, but no soaking baths/hot tubs/pools for 1 week.   Hold metformin for 48 hours. Resume Sunday.   Increase activity slowly   Complete by:  As directed       Discharge Medications   Allergies as of 05/11/2019   No Known Allergies     Medication List    TAKE these medications   albuterol 108 (90 Base) MCG/ACT inhaler Commonly known as:  ProAir HFA Inhale 2 puffs into the lungs every 6 (six) hours as needed for wheezing or shortness of breath.   aspirin 81 MG tablet Take 1 tablet (81 mg total) by mouth daily. What changed:    medication strength  how much to take  when to take this   BENFOTIAMINE MULTI-B PO Take 1 tablet by mouth 2 (two) times daily.   bisoprolol 5 MG tablet Commonly known as:  ZEBETA TAKE 1 TABLET BY MOUTH  DAILY What changed:  when to take this   CINNAMON PO Take 2,000 mg by mouth every evening.   clopidogrel 75 MG tablet Commonly known as:  PLAVIX Take 1 tablet (75 mg total) by mouth daily with breakfast. Start taking on:  May 12, 2019   diclofenac sodium 1 % Gel Commonly known as:  VOLTAREN  Apply 1 application topically daily as needed (back pain).   doxazosin 2 MG tablet Commonly known as:  CARDURA Take 2 mg by mouth every evening.   doxycycline 50 MG tablet Commonly known as:  ADOXA Take 50 mg by mouth daily at 2 PM.   fish oil-omega-3 fatty acids 1000 MG capsule Take 3 g by mouth daily.   furosemide 20 MG tablet Commonly known as:  LASIX Take 20 mg by mouth 2 (two) times daily.   GARLIC PO Take 9,983 mg by mouth daily.   glipiZIDE 10 MG 24 hr tablet Commonly known as:  GLUCOTROL XL Take 10 mg by mouth daily with breakfast.   isosorbide mononitrate 30 MG 24 hr tablet Commonly  known as:  IMDUR TAKE 1 TABLET BY MOUTH  DAILY   Jardiance 10 MG Tabs tablet Generic drug:  empagliflozin Take 10 mg by mouth daily.   L-Lysine 1000 MG Tabs Take 1,000 mg by mouth daily.   losartan 100 MG tablet Commonly known as:  COZAAR Take 100 mg by mouth daily.   metFORMIN 1000 MG tablet Commonly known as:  GLUCOPHAGE Take 1,000 mg by mouth 2 (two) times daily with a meal.   multivitamin tablet Take 1 tablet by mouth daily.   pentoxifylline 400 MG CR tablet Commonly known as:  TRENTAL TAKE 1 TABLET BY MOUTH  TWICE A DAY What changed:  when to take this   polyethylene glycol-electrolytes 420 g solution Commonly known as:  NuLYTELY/GoLYTELY Take 4,000 mLs by mouth as directed.   potassium chloride SA 20 MEQ tablet Commonly known as:  K-DUR Take 20 mEq by mouth 2 (two) times daily.   rOPINIRole 1 MG tablet Commonly known as:  REQUIP Take 1 mg by mouth every evening.   simvastatin 40 MG tablet Commonly known as:  ZOCOR Take 40 mg by mouth every evening.   sitaGLIPtin 100 MG tablet Commonly known as:  JANUVIA Take 100 mg by mouth daily.   Spiriva HandiHaler 18 MCG inhalation capsule Generic drug:  tiotropium INHALE THE CONTENTS OF 1  CAPSULE VIA HANDIHALER  DAILY What changed:  See the new instructions.   SUPER B COMPLEX/C PO Take 1 tablet by mouth  daily.        Acute coronary syndrome (MI, NSTEMI, STEMI, etc) this admission?: No.    Outstanding Labs/Studies   None  Duration of Discharge Encounter   Greater than 30 minutes including physician time.  Jarrett Soho, [place] 05/11/2019, 3:53 PM  Patient seen and examined with the above-signed Advanced Practice Provider and/or Housestaff. I personally reviewed laboratory data, imaging studies and relevant notes. I independently examined the patient and formulated the important aspects of the plan. I have edited the note to reflect any of my changes or salient points. I have personally discussed the plan with the patient and/or family.  He is stable for d/c after same-day PCI.   Glori Bickers, MD  4:30 PM

## 2019-05-11 NOTE — Discharge Instructions (Signed)
HOLD METFORMIN FOR A FULL 48 HOURS AFTER DISCHARGE.   DRINK PLENTY OF FLUIDS FOR THE NEXT 2-3 DAYS TO KEEP HYDRATED.  Radial Site Care  This sheet gives you information about how to care for yourself after your procedure. Your health care provider may also give you more specific instructions. If you have problems or questions, contact your health care provider. What can I expect after the procedure? After the procedure, it is common to have:  Bruising and tenderness at the catheter insertion area. Follow these instructions at home: Medicines  Take over-the-counter and prescription medicines only as told by your health care provider. Insertion site care  Follow instructions from your health care provider about how to take care of your insertion site. Make sure you: ? Wash your hands with soap and water before you change your bandage (dressing). If soap and water are not available, use hand sanitizer. ? Change your dressing as told by your health care provider. ? Leave stitches (sutures), skin glue, or adhesive strips in place. These skin closures may need to stay in place for 2 weeks or longer. If adhesive strip edges start to loosen and curl up, you may trim the loose edges. Do not remove adhesive strips completely unless your health care provider tells you to do that.  Check your insertion site every day for signs of infection. Check for: ? Redness, swelling, or pain. ? Fluid or blood. ? Pus or a bad smell. ? Warmth.  Do not take baths, swim, or use a hot tub until your health care provider approves.  You may shower 24-48 hours after the procedure, or as directed by your health care provider. ? Remove the dressing and gently wash the site with plain soap and water. ? Pat the area dry with a clean towel. ? Do not rub the site. That could cause bleeding.  Do not apply powder or lotion to the site. Activity   For 24 hours after the procedure, or as directed by your health  care provider: ? Do not flex or bend the affected arm. ? Do not push or pull heavy objects with the affected arm. ? Do not drive yourself home from the hospital or clinic. You may drive 24 hours after the procedure unless your health care provider tells you not to. ? Do not operate machinery or power tools.  Do not lift anything that is heavier than 10 lb (4.5 kg) for 5 days.  Ask your health care provider when it is okay to: ? Return to work or school. ? Resume usual physical activities or sports. ? Resume sexual activity. General instructions  If the catheter site starts to bleed, raise your arm and put firm pressure on the site. If the bleeding does not stop, get help right away. This is a medical emergency.  If you went home on the same day as your procedure, a responsible adult should be with you for the first 24 hours after you arrive home.  Keep all follow-up visits as told by your health care provider. This is important. Contact a health care provider if:  You have a fever.  You have redness, swelling, or yellow drainage around your insertion site. Get help right away if:  You have unusual pain at the radial site.  The catheter insertion area swells very fast.  The insertion area is bleeding, and the bleeding does not stop when you hold steady pressure on the area.  Your arm or hand becomes pale,  cool, tingly, or numb. These symptoms may represent a serious problem that is an emergency. Do not wait to see if the symptoms will go away. Get medical help right away. Call your local emergency services (911 in the [place] Do not drive yourself to the hospital. Summary  After the procedure, it is common to have bruising and tenderness at the site.  Follow instructions from your health care provider about how to take care of your radial site wound. Check the wound every day for signs of infection.  Do not lift anything that is heavier than 10 lb (4.5 kg) for 5 days.  This  information is not intended to replace advice given to you by your health care provider. Make sure you discuss any questions you have with your health care provider. Document Released: 01/15/2011 Document Revised: 01/18/2018 Document Reviewed: 01/18/2018 Elsevier Interactive Patient Education  2019 [employer].

## 2019-05-14 ENCOUNTER — Encounter (HOSPITAL_COMMUNITY): Payer: Self-pay | Admitting: Internal Medicine

## 2019-05-14 MED FILL — Nitroglycerin IV Soln 100 MCG/ML in D5W: INTRA_ARTERIAL | Qty: 10 | Status: AC

## 2019-05-18 ENCOUNTER — Other Ambulatory Visit: Payer: Self-pay

## 2019-05-18 ENCOUNTER — Encounter (HOSPITAL_COMMUNITY): Payer: Self-pay

## 2019-05-18 ENCOUNTER — Ambulatory Visit (HOSPITAL_COMMUNITY)
Admit: 2019-05-18 | Discharge: 2019-05-18 | Disposition: A | Discharge status: Home / Self Care | Payer: Medicare Other | Attending: Internal Medicine | Admitting: Internal Medicine

## 2019-05-18 DIAGNOSIS — I2510 Atherosclerotic heart disease of native coronary artery without angina pectoris: Secondary | ICD-10-CM

## 2019-05-18 DIAGNOSIS — I10 Essential (primary) hypertension: Secondary | ICD-10-CM

## 2019-05-18 MED ORDER — NITROGLYCERIN 0.4 MG SL SUBL: 0.4000 mg | SUBLINGUAL_TABLET | SUBLINGUAL | 3 refills | Status: AC | PRN

## 2019-05-18 MED ORDER — CLOPIDOGREL BISULFATE 75 MG PO TABS: 75.0000 mg | ORAL_TABLET | Freq: Every day | ORAL | 3 refills | Status: DC

## 2019-05-18 NOTE — Addendum Note (Signed)
Encounter addended by: Marlise Eves, RN on: 05/18/2019 12:34 PM  Actions taken: Clinical Note Signed

## 2019-05-18 NOTE — Progress Notes (Signed)
Refilled meds. Orders sent to scheduler to add echo. avs sent to mychart.

## 2019-05-18 NOTE — Progress Notes (Addendum)
Heart Failure TeleHealth Note  Due to national recommendations of social distancing due to Anthony [name], Audio/video telehealth visit is felt to be most appropriate for this patient at this time.  See MyChart message from today for patient consent regarding telehealth for [facility].  Date:  05/18/2019   ID:  Anthony [name], DOB December 25, 1946, MRN [id number]  Location: Home  Provider location: [facility] Type of Visit: Established patient  PCP:  [name], MD  Cardiologist:  No primary care provider on file. Primary HF: Nyeemah Jennette  Chief Complaint: Heart Failure follow-up   History of Present Illness:  Anthony [name] is a 73 y.o. male with h/o obesity, HTN, HL, severe osteoarthritis, DM2 (diagnosed 2009), OSA (intolerant of CPAP), former smoker (quit 1989), CAD s/p stent 2001 and PAD. He is the father-in-law of Dr. [name] in Interventional Radiology.    Had cath in [place] in 2001. Was told he had 70% blockage in one artery and the one in the back was totally blocked. Eventually underwent stenting of that artery.   Has severe PAD R>L followed by Dr. [name]. Now status post right external iliac, common femoral, superficial femoral endarterectomy with bovine pericardial patch angioplasty on 10/10/2014  Saw Dr. Halford Chessman and placed on home O2 but now weaned off with inhalers. Checks sats with pulse oximeter  He presents via Engineer, civil (consulting) for a telehealth visit today. We had a televist on 5/8 c/o more exertional dyspnea.  Underwent R/L cath on 5/15 with normal RH pressures. Found to have high-grade pLAD lesion (distal LAD occluded as well as CTO small RCA). Underwent PCI of LAD and POBA of diagonal. EF normal. Has done well post-cath. Feels breathing is better. No CP. Taking plavix.Still with some rectal spotting - follows up GI Ardis Hughs) on 6/10. No access site complications. Uses CPAP qHS. Still having some LE claudication but stable (follows  with [facility]).   PFTs with moderate COPD FEV1 1.83 (62%) FVC   2.84 (66%) FEF 25-75% 0.79 (29%) DLCO 67  ECHO 10/13 EF 50-55%  ECHO 5/16: EF 45-50% mold to moderate RV dysfunction Grade II DD  CXR with elevated R hemidiaphragm. Sleep study with severe OS - AHI 39.    [place] WENZLICK denies symptoms worrisome for COVID [name].   Past Medical History:  Diagnosis Date   Arthritis    CAD (coronary artery disease)    stent placed 06/02/2000   Chronic respiratory failure with hypoxia ([place]) 08/11/2012   Congestive heart failure (HCC)    COPD (chronic obstructive pulmonary disease) ([place]) 08/11/2012   Diabetes mellitus ([place])    Hyperlipidemia    Hypertension    Lumbar stenosis    OSA (obstructive sleep apnea) 08/11/2012   cpcap   Peripheral vascular disease ([place])    Pneumonia 1999   Shortness of breath    with exertion   Past Surgical History:  Procedure Laterality Date   ABDOMINAL ANGIOGRAM  Dec. 4, 2013   ABDOMINAL AORTAGRAM N/A 11/29/2012   Procedure: ABDOMINAL Maxcine Ham;  Surgeon: Serafina Mitchell, MD;  Location: [facility] C[facility] LAB;  Service: Cardiovascular;  Laterality: N/A;   ABDOMINAL AORTAGRAM N/A 09/03/2014   Procedure: ABDOMINAL Maxcine Ham;  Surgeon: Serafina Mitchell, MD;  Location: [facility] CATH LAB;  Service: Cardiovascular;  Laterality: N/A;   ANGIOPLASTY  06/02/2000   Stent   ANTERIOR LATERAL LUMBAR FUSION 4 LEVELS Right 05/28/2014   Procedure: Right L4-5 L3-4 L2-3, L1-2  Anterior lateral lumbar fusion with  percutaneaous pedicle screws. Lumbar four/five, three/four, two/three and possible two/one;  Surgeon: Erline Levine, MD;  Location: [facility] NEURO ORS;  Service: Neurosurgery;  Laterality: Right;  Lumbar One-Five Fusion with Percutaneous Screws   [place]   right foot   CARDIAC CATHETERIZATION     CARPAL TUNNEL RELEASE  09/30/2006   right wrist   CERVICAL FUSION  Nov 2002   COLONOSCOPY W/ BIOPSIES AND POLYPECTOMY     benign   CORONARY STENT INTERVENTION  N/A 05/11/2019   Procedure: CORONARY STENT INTERVENTION;  Surgeon: Burnell Blanks, MD;  Location: [facility] CV LAB;  Service: Cardiovascular;  Laterality: N/A;   ENDARTERECTOMY FEMORAL Right 10/10/2014   Procedure: RIGHT FEMORAL ARTERY ENDARTERECTOMY  WITH VASCU GUARD PATCH ANGIOPLASTY;  Surgeon: Serafina Mitchell, MD;  Location: [facility];  Service: Vascular;  Laterality: Right;   EYE SURGERY Bilateral 2014   cataracts   HAMMER TOE SURGERY  June 2007 & August 2008   left foot   HAND ARTHROPLASTY  Oct 2010   right thumb   Heart catherization  03/26/1999   LUMBAR DISC SURGERY  Dec 1997 & Ampril 2005   LUMBAR PERCUTANEOUS PEDICLE SCREW 4 LEVEL N/A 05/28/2014   Procedure: LUMBAR PERCUTANEOUS PEDICLE SCREW 4 LEVEL;  Surgeon: Erline Levine, MD;  Location: [facility] NEURO ORS;  Service: Neurosurgery;  Laterality: N/A;   [place]   right foot   PERIPHERAL VASCULAR CATHETERIZATION N/A 12/07/2016   Procedure: Abdominal Aortogram w/Lower Extremity;  Surgeon: Serafina Mitchell, MD;  Location: [facility] CV LAB;  Service: Cardiovascular;  Laterality: N/A;   RIGHT/LEFT HEART CATH AND CORONARY ANGIOGRAPHY N/A 05/11/2019   Procedure: RIGHT/LEFT HEART CATH AND CORONARY ANGIOGRAPHY;  Surgeon: Jolaine Artist, MD;  Location: [facility] CV LAB;  Service: Cardiovascular;  Laterality: N/A;   SPINE SURGERY  04/21/2004   Lower back disk gurgery- Lumbar stenosis   TONSILLECTOMY  1965     Current Outpatient Medications  Medication Sig Dispense Refill   albuterol (PROAIR HFA) 108 (90 Base) MCG/ACT inhaler Inhale 2 puffs into the lungs every 6 (six) hours as needed for wheezing or shortness of breath. 3 Inhaler 3   aspirin 81 MG tablet Take 1 tablet (81 mg total) by mouth daily. 90 tablet 1   B Complex-Folic Acid (BENFOTIAMINE MULTI-B PO) Take 1 tablet by mouth 2 (two) times daily.      bisoprolol (ZEBETA) 5 MG tablet TAKE 1 TABLET BY MOUTH  DAILY (Patient taking differently: Take 5 mg by  mouth every evening. ) 90 tablet 1   CINNAMON PO Take 2,000 mg by mouth every evening.      clopidogrel (PLAVIX) 75 MG tablet Take 1 tablet (75 mg total) by mouth daily with breakfast. 30 tablet 11   diclofenac sodium (VOLTAREN) 1 % GEL Apply 1 application topically daily as needed (back pain).     doxazosin (CARDURA) 2 MG tablet Take 2 mg by mouth every evening.      doxycycline (ADOXA) 50 MG tablet Take 50 mg by mouth daily at 2 PM.      empagliflozin (JARDIANCE) 10 MG TABS tablet Take 10 mg by mouth daily.     fish oil-omega-3 fatty acids 1000 MG capsule Take 3 g by mouth daily.      furosemide (LASIX) 20 MG tablet Take 20 mg by mouth 2 (two) times daily.      GARLIC PO Take 0,174 mg by mouth daily.  glipiZIDE (GLUCOTROL XL) 10 MG 24 hr tablet Take 10 mg by mouth daily with breakfast.     isosorbide mononitrate (IMDUR) 30 MG 24 hr tablet TAKE 1 TABLET BY MOUTH  DAILY (Patient taking differently: Take 30 mg by mouth daily. ) 90 tablet 1   L-Lysine 1000 MG TABS Take 1,000 mg by mouth daily.      losartan (COZAAR) 100 MG tablet Take 100 mg by mouth daily.     metFORMIN (GLUCOPHAGE) 1000 MG tablet Take 1,000 mg by mouth 2 (two) times daily with a meal.     Multiple Vitamin (MULTIVITAMIN) tablet Take 1 tablet by mouth daily.     pentoxifylline (TRENTAL) 400 MG CR tablet TAKE 1 TABLET BY MOUTH  TWICE A DAY (Patient taking differently: Take 400 mg by mouth 2 (two) times a day. ) 180 tablet 2   polyethylene glycol-electrolytes (NULYTELY/GOLYTELY) 420 g solution Take 4,000 mLs by mouth as directed. 4000 mL 0   potassium chloride SA (K-DUR,KLOR-CON) 20 MEQ tablet Take 20 mEq by mouth 2 (two) times daily.     rOPINIRole (REQUIP) 1 MG tablet Take 1 mg by mouth every evening.      simvastatin (ZOCOR) 40 MG tablet Take 40 mg by mouth every evening.     sitaGLIPtin (JANUVIA) 100 MG tablet Take 100 mg by mouth daily.     SPIRIVA HANDIHALER 18 MCG inhalation capsule INHALE THE  CONTENTS OF 1  CAPSULE VIA HANDIHALER  DAILY (Patient taking differently: Place 18 mcg into inhaler and inhale daily. ) 90 capsule 3   SUPER B COMPLEX/C PO Take 1 tablet by mouth daily.     No current facility-administered medications for this encounter.     Allergies:   Patient has no known allergies.   Social History:  The patient  reports that he quit smoking about 31 years ago. His smoking use included cigarettes. He has a 56.00 pack-year smoking history. He has never used smokeless tobacco. He reports current alcohol use. He reports that he does not use drugs.   Family History:  The patient's family history includes Cancer in his father and mother; Deep vein thrombosis in his mother; Diabetes in his mother; Heart attack in his father; Heart disease (age of onset: 66) in his father; Hyperlipidemia in his father and mother; Hypertension in his father and mother.   ROS:  Please see the history of present illness.   All other systems are personally reviewed and negative.   Exam:  (Video/Tele Health Call; Exam is subjective and or/visual.) General:  Speaks in full sentences. No resp difficulty. Lungs: Normal respiratory effort with conversation.  Abdomen: Non-distended per patient report Extremities: Pt denies edema. Neuro: Alert & oriented x 3.   Recent Labs: 05/08/2019: BUN 29; Creatinine, Ser 1.61; Platelets 142 05/11/2019: Hemoglobin 14.3; Potassium 4.9; Sodium 137  Personally reviewed   Wt Readings from Last 3 Encounters:  05/11/19 108.9 kg (240 lb)  04/16/19 108.9 kg (240 lb)  12/06/[name] 113.4 kg (250 lb)      ASSESSMENT AND PLAN:  1) CAD -Underwent R/L cath on 05/11/19 with normal RH pressures. Found to have high-grade pLAD lesion (distal LAD occluded as well as CTO small RCA). Underwent PCI of LAD and POBA of diagonal. EF normal - Continue DAPT (at least 12 months), statin, Jardiance - Unable to due CR due to COVID and back and leg pain. I have encouraged to f/u with [facility] and  [facility] to keep him as active as possible 2)  Chronic Diastolic HF-  EF 03-50% with mild RV dysfunction on ECHO 5/16. - NYHA II-III limited by PAD and back pain, Volume status appears stable - RHC numbers from cath 05/11/19 look good - Continue lasix 20 mg BID - EF stable by cath - repeat echo at next visit 3) PAD - s/p femoral artery endarterectomy in 2015. Followed by [facility]. Per Dr [name] - still with 1-2 block claudication 4) HTN - well controlled. SBP < 140 5) OSA  - Continue nightly CPAP.  6) Hyperlipidemia  - Followed by Dr. Virgina Jock. Continue statin. Goal LDL < 70 7) COPD - Stable. Follows with Dr Halford Chessman 8) Back pain - Has f/u with Dr. Vertell Limber in [place] to f/u. Has had previous fusion 9) Agent Orange exposure - Follows at [facility] screen The patient does not have any symptoms that suggest any further testing/ screening at this time.  Social distancing reinforced today.  Recommended follow-up:  As above  Relevant cardiac medications were reviewed at length with the patient today.   The patient does not have concerns regarding their medications at this time.   The following changes were made today:  As above  Today, I have spent 16 minutes with the patient with telehealth technology discussing the above issues .    Signed, Glori Bickers, MD  05/18/2019 11:49 AM  Advanced [facility] Failure [place] [phone] (office) [phone] (fax)

## 2019-05-18 NOTE — Addendum Note (Signed)
Encounter addended by: Marlise Eves, RN on: 05/18/2019 12:32 PM  Actions taken: Pharmacy for encounter modified, Order list changed, Clinical Note Signed

## 2019-05-18 NOTE — Patient Instructions (Signed)
START Nitroglycerin 0.4 mg tab as needed for chest pain.  REFILLED Plavix to [facility] for a 90 day supply.  Your physician has requested that you have an echocardiogram. Echocardiography is a painless test that uses sound waves to create images of your heart. It provides your doctor with information about the size and shape of your heart and how well your heart's chambers and valves are working. This procedure takes approximately one hour. There are no restrictions for this procedure. This will be done at your follow up appointment in August.  Please Follow up with Dr. [name] on August 12th at 10:20am. This may change to accommodate your echocardiogram.

## 2019-05-24 ENCOUNTER — Other Ambulatory Visit: Payer: Self-pay

## 2019-05-24 ENCOUNTER — Ambulatory Visit
Admission: RE | Admit: 2019-05-24 | Discharge: 2019-05-24 | Disposition: A | Discharge status: Home / Self Care | Payer: Medicare Other | Source: Ambulatory Visit | Attending: Neurosurgery | Admitting: Neurosurgery

## 2019-05-24 DIAGNOSIS — M4310 Spondylolisthesis, site unspecified: Secondary | ICD-10-CM

## 2019-05-24 MED ORDER — GADOBENATE DIMEGLUMINE 529 MG/ML IV SOLN
10.0000 mL | Freq: Once | INTRAVENOUS | Status: AC | PRN
  Administered 2019-05-24: 10 mL via INTRAVENOUS

## 2019-05-26 ENCOUNTER — Encounter (HOSPITAL_COMMUNITY): Payer: Self-pay | Admitting: Internal Medicine

## 2019-06-05 ENCOUNTER — Telehealth: Payer: Self-pay

## 2019-06-05 NOTE — Telephone Encounter (Signed)
Covid-19 screening questions  Have you traveled in the last 14 days? No If yes where?  Do you now or have you had a fever in the last 14 days? No  Do you have any respiratory symptoms of shortness of breath or cough now or in the last 14 days? No  Do you have any family members or close contacts with diagnosed or suspected Covid-19 in the past 14 days? No  Have you been tested for Covid-19 and found to be positive? Patient has been tested but found negative on 05/11/19

## 2019-06-06 ENCOUNTER — Ambulatory Visit (INDEPENDENT_AMBULATORY_CARE_PROVIDER_SITE_OTHER): Payer: Medicare Other | Admitting: Gastroenterology

## 2019-06-06 ENCOUNTER — Telehealth: Payer: Self-pay

## 2019-06-06 ENCOUNTER — Encounter: Payer: Self-pay | Admitting: Gastroenterology

## 2019-06-06 VITALS — BP 130/82 | HR 82 | Wt 247.0 lb

## 2019-06-06 DIAGNOSIS — K625 Hemorrhage of anus and rectum: Secondary | ICD-10-CM

## 2019-06-06 NOTE — Progress Notes (Signed)
Review of pertinent gastrointestinal problems: 1. FH of colon cancer (father, diagnosed in his 38s) Colonoscopy 2006;our records only show two color pictures from this report with no text. Colonoscopy 2011[facility]. [facility]t [facility]. Diverticulosis was found. Onesmall (removed with biopsy forceps)hyperplastic by pathology polyps wasfound.  Colonoscopy Dr. Ardis Hughs October 2017 found left-sided diverticulosis, a single 4 mm polyp was removed from the sigmoid colon, it was a tubular adenoma and I recommended that he have a repeat colonoscopy at 5-year interval. 2.  Rectal bleeding, mucus discharge April 2020.  Telemedicine visit, I recommended repeating his colonoscopy when he returned from [place] and to try fiber supplements in the meantime.    HPI: This is a very pleasant 73 year old man whom I last communicated with by telemedicine visit about a month and a half ago.  That was regarding some new rectal bleeding that he was having.  It seemed hemorrhoidal however given his family history of colon cancer I recommend a colonoscopy when he returns from [place].  I also recommended a trial of fiber supplements which he tells me actually increased the amount of blood that he was seen.  Unfortunately he began having exertional dyspnea prior to the colonoscopy, he discussed this with his cardiologist in early May and so underwent "R/L cath on 5/15 with normal RH pressures. Found to have high-grade pLAD lesion (distal LAD occluded as well as CTO small RCA). Underwent PCI of LAD and POBA of diagonal. EF normal. Has done well post-cath. Feels breathing is better. No CP". He was started on plavix.he will probably remain on Plavix for at least 1 year  Labs 04/2019: cbc normal.  He continues to have bright red rectal bleeding.  This happens most days of the week.  It is not only during a bowel movement but can occur between bowel movements.  He is having to put some tissue between his cheeks to prevent  bleeding into his shorts and pants.  He has no anal discomfort.  He is not having any difficulty moving his bowels.  Chief complaint is red rectal bleeding  ROS: complete GI ROS as described in HPI, all other review negative.  Constitutional:  No unintentional weight loss   Past Medical History:  Diagnosis Date  . Arthritis   . CAD (coronary artery disease)    stent placed 06/02/2000  . Chronic respiratory failure with hypoxia ([place]) 08/11/2012  . Congestive heart failure ([place])   . COPD (chronic obstructive pulmonary disease) ([place]) 08/11/2012  . Diabetes mellitus ([place])   . Hyperlipidemia   . Hypertension   . Lumbar stenosis   . OSA (obstructive sleep apnea) 08/11/2012   cpcap  . Peripheral vascular disease ([place])   . Pneumonia 1999  . Shortness of breath    with exertion    Past Surgical History:  Procedure Laterality Date  . ABDOMINAL ANGIOGRAM  Dec. 4, 2013  . ABDOMINAL AORTAGRAM N/A 11/29/2012   Procedure: ABDOMINAL Maxcine Ham;  Surgeon: Serafina Mitchell, MD;  Location: [facility] CATH LAB;  Service: Cardiovascular;  Laterality: N/A;  . ABDOMINAL AORTAGRAM N/A 09/03/2014   Procedure: ABDOMINAL AORTAGRAM;  Surgeon: Serafina Mitchell, MD;  Location: [facility] CATH LAB;  Service: Cardiovascular;  Laterality: N/A;  . ANGIOPLASTY  06/02/2000   Stent  . ANTERIOR LATERAL LUMBAR FUSION 4 LEVELS Right 05/28/2014   Procedure: Right L4-5 L3-4 L2-3, L1-2  Anterior lateral lumbar fusion with percutaneaous pedicle screws. Lumbar four/five, three/four, two/three and possible two/one;  Surgeon: Erline Levine, MD;  Location: [facility]  NEURO ORS;  Service: Neurosurgery;  Laterality: Right;  Lumbar One-Five Fusion with Percutaneous Screws  . [place]   right foot  . CARDIAC CATHETERIZATION    . CARPAL TUNNEL RELEASE  09/30/2006   right wrist  . CERVICAL FUSION  Nov 2002  . COLONOSCOPY W/ BIOPSIES AND POLYPECTOMY     benign  . CORONARY STENT INTERVENTION N/A 05/11/2019   Procedure: CORONARY STENT INTERVENTION;   Surgeon: Burnell Blanks, MD;  Location: [place] CV LAB;  Service: Cardiovascular;  Laterality: N/A;  . ENDARTERECTOMY FEMORAL Right 10/10/2014   Procedure: RIGHT FEMORAL ARTERY ENDARTERECTOMY  WITH VASCU GUARD PATCH ANGIOPLASTY;  Surgeon: Serafina Mitchell, MD;  Location: [place];  Service: Vascular;  Laterality: Right;  . EYE SURGERY Bilateral 2014   cataracts  . HAMMER TOE SURGERY  June 2007 & August 2008   left foot  . HAND ARTHROPLASTY  Oct 2010   right thumb  . Heart catherization  03/26/1999  . [place] SURGERY  Dec 1997 & Ampril 2005  . LUMBAR PERCUTANEOUS PEDICLE SCREW 4 LEVEL N/A 05/28/2014   Procedure: LUMBAR PERCUTANEOUS PEDICLE SCREW 4 LEVEL;  Surgeon: Erline Levine, MD;  Location: [place] NEURO ORS;  Service: Neurosurgery;  Laterality: N/A;  . [place]   right foot  . PERIPHERAL VASCULAR CATHETERIZATION N/A 12/07/2016   Procedure: Abdominal Aortogram w/Lower Extremity;  Surgeon: Serafina Mitchell, MD;  Location: [place] CV LAB;  Service: Cardiovascular;  Laterality: N/A;  . RIGHT/LEFT HEART CATH AND CORONARY ANGIOGRAPHY N/A 05/11/2019   Procedure: RIGHT/LEFT HEART CATH AND CORONARY ANGIOGRAPHY;  Surgeon: Jolaine Artist, MD;  Location: [place] CV LAB;  Service: Cardiovascular;  Laterality: N/A;  . SPINE SURGERY  04/21/2004   Lower back disk gurgery- Lumbar stenosis  . TONSILLECTOMY  1965    Current Outpatient Medications  Medication Sig Dispense Refill  . albuterol (PROAIR HFA) 108 (90 Base) MCG/ACT inhaler Inhale 2 puffs into the lungs every 6 (six) hours as needed for wheezing or shortness of breath. 3 Inhaler 3  . aspirin 81 MG tablet Take 1 tablet (81 mg total) by mouth daily. 90 tablet 1  . B Complex-Folic Acid (BENFOTIAMINE MULTI-B PO) Take 1 tablet by mouth 2 (two) times daily.     . bisoprolol (ZEBETA) 5 MG tablet TAKE 1 TABLET BY MOUTH  DAILY (Patient taking differently: Take 5 mg by mouth every evening. ) 90 tablet 1  . CINNAMON PO Take  2,000 mg by mouth every evening.     . clopidogrel (PLAVIX) 75 MG tablet Take 1 tablet (75 mg total) by mouth daily with breakfast. 90 tablet 3  . diclofenac sodium (VOLTAREN) 1 % GEL Apply 1 application topically daily as needed (back pain).    Marland Kitchen doxazosin (CARDURA) 2 MG tablet Take 2 mg by mouth every evening.     Marland Kitchen doxycycline (ADOXA) 50 MG tablet Take 50 mg by mouth daily at 2 PM.     . empagliflozin (JARDIANCE) 10 MG TABS tablet Take 10 mg by mouth daily.    . fish oil-omega-3 fatty acids 1000 MG capsule Take 3 g by mouth daily.     . furosemide (LASIX) 20 MG tablet Take 20 mg by mouth 2 (two) times daily.     Marland Kitchen GARLIC PO Take 4,742 mg by mouth daily.     Marland Kitchen glipiZIDE (GLUCOTROL XL) 10 MG 24 hr tablet Take 10 mg by mouth daily with breakfast.    .  isosorbide mononitrate (IMDUR) 30 MG 24 hr tablet TAKE 1 TABLET BY MOUTH  DAILY (Patient taking differently: Take 30 mg by mouth daily. ) 90 tablet 1  . L-Lysine 1000 MG TABS Take 1,000 mg by mouth daily.     Marland Kitchen losartan (COZAAR) 100 MG tablet Take 100 mg by mouth daily.    . metFORMIN (GLUCOPHAGE) 1000 MG tablet Take 1,000 mg by mouth 2 (two) times daily with a meal.    . Multiple Vitamin (MULTIVITAMIN) tablet Take 1 tablet by mouth daily.    . nitroGLYCERIN (NITROSTAT) 0.4 MG SL tablet Place 1 tablet (0.4 mg total) under the tongue every 5 (five) minutes as needed for chest pain. 90 tablet 3  . pentoxifylline (TRENTAL) 400 MG CR tablet TAKE 1 TABLET BY MOUTH  TWICE A DAY (Patient taking differently: Take 400 mg by mouth 2 (two) times a day. ) 180 tablet 2  . polyethylene glycol-electrolytes (NULYTELY/GOLYTELY) 420 g solution Take 4,000 mLs by mouth as directed. 4000 mL 0  . potassium chloride SA (K-DUR,KLOR-CON) 20 MEQ tablet Take 20 mEq by mouth 2 (two) times daily.    Marland Kitchen rOPINIRole (REQUIP) 1 MG tablet Take 1 mg by mouth every evening.     . simvastatin (ZOCOR) 40 MG tablet Take 40 mg by mouth every evening.    . sitaGLIPtin (JANUVIA) 100 MG  tablet Take 100 mg by mouth daily.    Marland Kitchen SPIRIVA HANDIHALER 18 MCG inhalation capsule INHALE THE CONTENTS OF 1  CAPSULE VIA HANDIHALER  DAILY (Patient taking differently: Place 18 mcg into inhaler and inhale daily. ) 90 capsule 3  . SUPER B COMPLEX/C PO Take 1 tablet by mouth daily.     No current facility-administered medications for this visit.     Allergies as of 06/06/2019  . (No Known Allergies)    Family History  Problem Relation Age of Onset  . Heart disease Father 24  . Cancer Father   . Hyperlipidemia Father   . Hypertension Father   . Heart attack Father   . Cancer Mother   . Deep vein thrombosis Mother        Varicose veins  . Diabetes Mother   . Hyperlipidemia Mother   . Hypertension Mother     Social History   Socioeconomic History  . Marital status: Married    Spouse name: Not on file  . Number of children: Not on file  . Years of education: Not on file  . Highest education level: Not on file  Occupational History  . Occupation: retired  Scientific laboratory technician  . Financial resource strain: Not on file  . Food insecurity:    Worry: Not on file    Inability: Not on file  . Transportation needs:    Medical: Not on file    Non-medical: Not on file  Tobacco Use  . Smoking status: Former Smoker    Packs/day: 2.00    Years: 28.00    Pack years: 56.00    Types: Cigarettes    Last attempt to quit: 08/03/1987    Years since quitting: 31.8  . Smokeless tobacco: Never Used  Substance and Sexual Activity  . Alcohol use: Yes    Comment: weekly  . Drug use: No  . Sexual activity: Not on file  Lifestyle  . Physical activity:    Days per week: Not on file    Minutes per session: Not on file  . Stress: Not on file  Relationships  . Social connections:  Talks on phone: Not on file    Gets together: Not on file    Attends religious service: Not on file    Active member of club or organization: Not on file    Attends meetings of clubs or organizations: Not on file     Relationship status: Not on file  . Intimate partner violence:    Fear of current or ex partner: Not on file    Emotionally abused: Not on file    Physically abused: Not on file    Forced sexual activity: Not on file  Other Topics Concern  . Not on file  Social History Narrative  . Not on file     Physical Exam: BP 130/82   Pulse 82   Wt 247 lb (112 kg)   BMI 36.48 kg/m  Constitutional: generally well-appearing Psychiatric: alert and oriented x3 Abdomen: soft, nontender, nondistended, no obvious ascites, no peritoneal signs, normal bowel sounds No peripheral edema noted in lower extremities Rectal examination: There was a bloodstained tissue between the cheeks, the blood had seeped through it into his underpants and tucked in shirt as well.  On examination he had obvious internal hemorrhoids that I could see intermittently prolapsing.  No anal fissure.  Digital rectal exam showed no fresh blood currently in his rectal vault, no distal rectal masses or tenderness.  Assessment and plan: 73 y.o. male with bleeding internal hemorrhoids, recently started on Plavix for a new coronary stent that was placed May 2020  He started having difficulty with some minor rectal bleeding before he was put on the Plavix.  Since then it has increased.  He has to wear a tissue pad between his cheeks and even then sometimes the blood will leak through and stain his pants.  He has obvious internal hemorrhoids on examination.  I discussed with him my partners Dr. Carlean Purl who has very good experience with an office hemorrhoidal banding and he has graciously agreed to review his information.  Obviously being on Plavix makes it a bit more risky and since the stent is only about 64 month old he may not be able to hold it for the banding procedure.  Please see the "Patient Instructions" section for addition details about the plan.  Owens Loffler, MD [facility] 06/06/2019, 8:45 AM

## 2019-06-06 NOTE — Patient Instructions (Signed)
We will contact you from Dr Celesta Aver office regarding scheduling you for Hemorrhoid banding  Normal BMI (Body Mass Index- based on height and weight) is between 23 and 30. Your BMI today is Body mass index is 36.48 kg/m. Marland Kitchen Please consider follow up  regarding your BMI with your Primary Care Provider.  Thank you for entrusting me with your care and choosing [facility].  Dr Ardis Hughs

## 2019-06-06 NOTE — Telephone Encounter (Signed)
-----   Message from Gatha Mayer, MD sent at 06/06/2019  2:59 PM EDT ----- I am willing to see him tomorrow at 330 if we can do that or will work him in next week - first open 1130 slot  ----- Message ----- From: Milus Banister, MD Sent: 06/06/2019   8:54 AM EDT To: Gatha Mayer, MD  Glendell Docker,  This is the patient with the internal hemorrhoids which are bleeding, recent Plavix start.  Thanks for agreeing to review this.  We did not set him up for an appointment with you yet because I was not sure when you wanted to squeeze him in between telemedicine visits.  Thanks again,  DJ

## 2019-06-06 NOTE — Telephone Encounter (Signed)
Covid-19 screening questions  Have you traveled in the last 14 days? no If yes where?  Do you now or have you had a fever in the last 14 days? No  Do you have any respiratory symptoms of shortness of breath or cough now or in the last 14 days? NO  Do you have any family members or close contacts with diagnosed or suspected Covid-19 in the past 14 days? No  Have you been tested for Covid-19 and found to be positive? NO  Patient will come in and see Dr. Carlean Purl tomorrow at 3:30

## 2019-06-07 ENCOUNTER — Ambulatory Visit (INDEPENDENT_AMBULATORY_CARE_PROVIDER_SITE_OTHER): Payer: Medicare Other | Admitting: Internal Medicine

## 2019-06-07 ENCOUNTER — Other Ambulatory Visit: Payer: Self-pay

## 2019-06-07 VITALS — BP 118/52 | HR 76 | Temp 98.2°F | Ht 69.0 in | Wt 246.2 lb

## 2019-06-07 DIAGNOSIS — K644 Residual hemorrhoidal skin tags: Secondary | ICD-10-CM | POA: Diagnosis not present

## 2019-06-07 DIAGNOSIS — Z7902 Long term (current) use of antithrombotics/antiplatelets: Secondary | ICD-10-CM | POA: Diagnosis not present

## 2019-06-07 MED ORDER — HYDROCORTISONE (PERIANAL) 2.5 % EX CREA: 1.0000 "application " | TOPICAL_CREAM | Freq: Two times a day (BID) | CUTANEOUS | 1 refills | Status: DC

## 2019-06-07 NOTE — Progress Notes (Signed)
  The patient has been having intermittent painless rectal bleeding.  It started when he was living in [place] in the winter.  Then he had a coronary intervention and had Plavix started.  He saw Dr. Ardis Hughs yesterday who described hemorrhoids with some prolapse and fresh blood in the anal area.  The blood seeps into his underwear and is using tissues to trap it.  Again no pain.  Moves his bowels about twice a day without significant straining or problems.  Because of the persistent bleeding the thought was to consider hemorrhoidal banding since Dr. Ardis Hughs suspected those as a source.  His last colonoscopy was in 2017 and had 4 mm adenoma from the sigmoid removed and diverticulosis in the sigmoid colon.    Digital rectal exam reveals fresh blood and after removing that I see violaceous discoloration of the anoderm slight, but I do not see any prolapsing hemorrhoids.  No anal tags.  Digital exam shows slightly decreased sphincter tone no mass and a slight amount of blood on the finger fresh.    Anoscopy is done and I see inflamed external hemorrhoids grade 1-2 in the anal canal region but I do not identify any clear signs of inflamed or prolapsing internal hemorrhoids.  The mucosa of the rectum that is seen looks normal without proctitis.  Fresh heme is seen oozing but an exact source is not identified.   Encounter Diagnoses  Name Primary?  . Bleeding external hemorrhoids Yes  . Long term current use of clopidogrel     Bleeding external hemorrhoids suspected As best I can tell this is a source of bleeding.  It seems like it unless he is having a very low-grade diverticular bleed or perhaps a polyp that grew in the interim.  That seems very unlikely.  I did not identify significant internal hemorrhoids on exam today I repeated his anoscopy actually to be sure.  I am going to treat with hydrocortisone cream 2.5% and I have asked him to call me in a few days, sooner if he has other problems.  Depending  upon how this goes, if it resolves then I think we have our answer if it does not he may need a sigmoidoscopy versus colonoscopy and have to remain on his Plavix.  I appreciate the opportunity to care for this patient. CC: [name], MD Dr. Oretha Caprice

## 2019-06-07 NOTE — Assessment & Plan Note (Signed)
As best I can tell this is a source of bleeding.  It seems like it unless he is having a very low-grade diverticular bleed or perhaps a polyp that grew in the interim.  That seems very unlikely.  I did not identify significant internal hemorrhoids on exam today I repeated his anoscopy actually to be sure.  I am going to treat with hydrocortisone cream 2.5% and I have asked him to call me in a few days, sooner if he has other problems.  Depending upon how this goes, if it resolves then I think we have our answer if it does not he may need a sigmoidoscopy versus colonoscopy and have to remain on his Plavix.

## 2019-06-07 NOTE — Patient Instructions (Signed)
Nice to meet you today.   We have sent the following medications to your pharmacy for you to pick up at your convenience: Anusol cream   Take one tablespoon of Benefiber nightly, handout provided.   Call us on Monday if you haven't heard from [place] before then.    I appreciate the opportunity to care for you. Silvano Rusk, MD, [facility]

## 2019-06-11 ENCOUNTER — Telehealth: Payer: Self-pay | Admitting: Internal Medicine

## 2019-06-11 NOTE — Telephone Encounter (Signed)
I also spoke to him.  Told him to take HC cream on top of prep H suppository nightly to complete 1 week of Tx and to call back if bleeding returns.

## 2019-06-11 NOTE — Telephone Encounter (Signed)
Patient called in requesting to speak to the doctor. He stated that he was told by the doctor to call back in on Monday and to speak with him or the nurse.

## 2019-06-11 NOTE — Telephone Encounter (Signed)
Patient reports no further bleeding since starting the suppositories last week. He will continue for at least the next week.  He is asked to call back if the bleeding returns for consideration of flex or colonoscopy

## 2019-06-22 ENCOUNTER — Telehealth (HOSPITAL_COMMUNITY): Payer: Self-pay

## 2019-06-22 NOTE — Telephone Encounter (Signed)
No response from pt, closed referral. °

## 2019-07-10 ENCOUNTER — Ambulatory Visit (INDEPENDENT_AMBULATORY_CARE_PROVIDER_SITE_OTHER): Payer: Medicare Other | Admitting: Orthopaedic Surgery

## 2019-07-10 ENCOUNTER — Telehealth: Payer: Self-pay

## 2019-07-10 ENCOUNTER — Other Ambulatory Visit: Payer: Self-pay

## 2019-07-10 ENCOUNTER — Encounter: Payer: Self-pay | Admitting: Orthopaedic Surgery

## 2019-07-10 ENCOUNTER — Ambulatory Visit (INDEPENDENT_AMBULATORY_CARE_PROVIDER_SITE_OTHER): Payer: Medicare Other

## 2019-07-10 DIAGNOSIS — M25562 Pain in left knee: Secondary | ICD-10-CM | POA: Diagnosis not present

## 2019-07-10 DIAGNOSIS — G8929 Other chronic pain: Secondary | ICD-10-CM

## 2019-07-10 DIAGNOSIS — M1712 Unilateral primary osteoarthritis, left knee: Secondary | ICD-10-CM

## 2019-07-10 NOTE — Progress Notes (Signed)
synvisc preauth not obtained today due to miscommunication with front desk.  appt will be rescheduled.

## 2019-07-10 NOTE — Telephone Encounter (Signed)
Submitted VOB for SynviscOne, left knee. 

## 2019-07-10 NOTE — Telephone Encounter (Signed)
Talked with patient and advised him that he is approved for gel injection.  Approved for SynviscOne, left knee. Buy & Bill Covered at 90% after deductible has been met. Patient will be responsible for 10% OOP. No Co-pay No PA required  Appt. 07/12/2019 with Dr. Erlinda Hong.

## 2019-07-12 ENCOUNTER — Ambulatory Visit (INDEPENDENT_AMBULATORY_CARE_PROVIDER_SITE_OTHER): Payer: Medicare Other | Admitting: Orthopaedic Surgery

## 2019-07-12 ENCOUNTER — Other Ambulatory Visit: Payer: Self-pay

## 2019-07-12 DIAGNOSIS — M1712 Unilateral primary osteoarthritis, left knee: Secondary | ICD-10-CM | POA: Diagnosis not present

## 2019-07-12 MED ORDER — HYLAN G-F 20 48 MG/6ML IX SOSY
48.0000 mg | PREFILLED_SYRINGE | INTRA_ARTICULAR | Status: AC | PRN
  Administered 2019-07-12: 48 mg via INTRA_ARTICULAR

## 2019-07-12 MED ORDER — LIDOCAINE HCL 1 % IJ SOLN
5.0000 mL | INTRAMUSCULAR | Status: AC | PRN
  Administered 2019-07-12: 5 mL

## 2019-07-12 NOTE — Progress Notes (Signed)
   Procedure Note  Patient: Anthony [name]             Date of Birth: 1946-06-15           MRN: [id number]             Visit Date: 07/12/2019  Procedures: Visit Diagnoses:  1. Unilateral primary osteoarthritis, left knee     Large Joint Inj: L knee on 07/12/2019 8:17 AM Indications: pain Details: 22 G needle  Arthrogram: No  Medications: 48 mg Hylan 48 MG/6ML; 5 mL lidocaine 1 % Outcome: tolerated well, no immediate complications Patient was prepped and draped in the usual sterile fashion.

## 2019-07-25 ENCOUNTER — Other Ambulatory Visit: Payer: Self-pay | Admitting: Vascular Surgery

## 2019-07-31 ENCOUNTER — Ambulatory Visit: Payer: Medicare Other | Admitting: Gastroenterology

## 2019-08-01 ENCOUNTER — Ambulatory Visit (INDEPENDENT_AMBULATORY_CARE_PROVIDER_SITE_OTHER): Payer: Medicare Other | Admitting: Podiatry

## 2019-08-01 ENCOUNTER — Other Ambulatory Visit: Payer: Self-pay

## 2019-08-01 ENCOUNTER — Encounter: Payer: Self-pay | Admitting: Podiatry

## 2019-08-01 DIAGNOSIS — E1142 Type 2 diabetes mellitus with diabetic polyneuropathy: Secondary | ICD-10-CM

## 2019-08-01 DIAGNOSIS — M79674 Pain in right toe(s): Secondary | ICD-10-CM | POA: Diagnosis not present

## 2019-08-01 DIAGNOSIS — B351 Tinea unguium: Secondary | ICD-10-CM | POA: Diagnosis not present

## 2019-08-01 DIAGNOSIS — M79675 Pain in left toe(s): Secondary | ICD-10-CM

## 2019-08-01 DIAGNOSIS — E1151 Type 2 diabetes mellitus with diabetic peripheral angiopathy without gangrene: Secondary | ICD-10-CM

## 2019-08-01 DIAGNOSIS — L84 Corns and callosities: Secondary | ICD-10-CM

## 2019-08-01 NOTE — Patient Instructions (Signed)

## 2019-08-05 NOTE — Progress Notes (Signed)
Subjective: Anthony [name] is a 73 y.o. y.o. male who presents for preventative foot care today with h/o diabetes and PAD. He voices no new pedal concerns on today's visit.  [name], MD is her PCP.    Current Outpatient Medications:  .  albuterol (PROAIR HFA) 108 (90 Base) MCG/ACT inhaler, Inhale 2 puffs into the lungs every 6 (six) hours as needed for wheezing or shortness of breath., Disp: 3 Inhaler, Rfl: 3 .  aspirin 81 MG tablet, Take 1 tablet (81 mg total) by mouth daily., Disp: 90 tablet, Rfl: 1 .  ASPIRIN LOW DOSE 81 MG EC tablet, Take 81 mg by mouth daily., Disp: , Rfl:  .  B Complex-Folic Acid (BENFOTIAMINE MULTI-B PO), Take 1 tablet by mouth 2 (two) times daily. , Disp: , Rfl:  .  bisoprolol (ZEBETA) 5 MG tablet, TAKE 1 TABLET BY MOUTH  DAILY (Patient taking differently: Take 5 mg by mouth every evening. ), Disp: 90 tablet, Rfl: 1 .  CINNAMON PO, Take 2,000 mg by mouth every evening. , Disp: , Rfl:  .  clopidogrel (PLAVIX) 75 MG tablet, Take 1 tablet (75 mg total) by mouth daily with breakfast., Disp: 90 tablet, Rfl: 3 .  diclofenac sodium (VOLTAREN) 1 % GEL, Apply 1 application topically daily as needed (back pain)., Disp: , Rfl:  .  doxazosin (CARDURA) 2 MG tablet, Take 2 mg by mouth every evening. , Disp: , Rfl:  .  doxycycline (ADOXA) 50 MG tablet, Take 50 mg by mouth daily at 2 PM. , Disp: , Rfl:  .  doxycycline (VIBRAMYCIN) 50 MG capsule, , Disp: , Rfl:  .  empagliflozin (JARDIANCE) 10 MG TABS tablet, Take 10 mg by mouth daily., Disp: , Rfl:  .  fish oil-omega-3 fatty acids 1000 MG capsule, Take 3 g by mouth daily. , Disp: , Rfl:  .  furosemide (LASIX) 20 MG tablet, Take 20 mg by mouth 2 (two) times daily. , Disp: , Rfl:  .  GARLIC PO, Take 7,062 mg by mouth daily. , Disp: , Rfl:  .  glipiZIDE (GLUCOTROL XL) 10 MG 24 hr tablet, Take 10 mg by mouth daily with breakfast., Disp: , Rfl:  .  hydrocortisone (ANUSOL-HC) 2.5 % rectal cream, Place 1 application rectally 2 (two)  times daily., Disp: 30 g, Rfl: 1 .  ipratropium (ATROVENT) 0.06 % nasal spray, USE 2 SPRAYS IN EACH NOSTRIL TWICE DAILY AS NEEDED FOR DRAINAGE, Disp: , Rfl:  .  isosorbide mononitrate (IMDUR) 30 MG 24 hr tablet, TAKE 1 TABLET BY MOUTH  DAILY (Patient taking differently: Take 30 mg by mouth daily. ), Disp: 90 tablet, Rfl: 1 .  L-Lysine 1000 MG TABS, Take 1,000 mg by mouth daily. , Disp: , Rfl:  .  losartan (COZAAR) 100 MG tablet, Take 100 mg by mouth daily., Disp: , Rfl:  .  metFORMIN (GLUCOPHAGE) 1000 MG tablet, Take 1,000 mg by mouth 2 (two) times daily with a meal., Disp: , Rfl:  .  Multiple Vitamin (MULTIVITAMIN) tablet, Take 1 tablet by mouth daily., Disp: , Rfl:  .  nitroGLYCERIN (NITROSTAT) 0.4 MG SL tablet, Place 1 tablet (0.4 mg total) under the tongue every 5 (five) minutes as needed for chest pain., Disp: 90 tablet, Rfl: 3 .  pentoxifylline (TRENTAL) 400 MG CR tablet, TAKE 1 TABLET BY MOUTH  TWICE A DAY, Disp: 180 tablet, Rfl: 3 .  polyethylene glycol-electrolytes (NULYTELY/GOLYTELY) 420 g solution, Take 4,000 mLs by mouth as directed., Disp: 4000 mL, Rfl:  0 .  potassium chloride SA (K-DUR,KLOR-CON) 20 MEQ tablet, Take 20 mEq by mouth 2 (two) times daily., Disp: , Rfl:  .  rOPINIRole (REQUIP) 1 MG tablet, Take 1 mg by mouth every evening. , Disp: , Rfl:  .  simvastatin (ZOCOR) 40 MG tablet, Take 40 mg by mouth every evening., Disp: , Rfl:  .  sitaGLIPtin (JANUVIA) 100 MG tablet, Take 100 mg by mouth daily., Disp: , Rfl:  .  SPIRIVA HANDIHALER 18 MCG inhalation capsule, INHALE THE CONTENTS OF 1  CAPSULE VIA HANDIHALER  DAILY (Patient taking differently: Place 18 mcg into inhaler and inhale daily. ), Disp: 90 capsule, Rfl: 3 .  SUPER B COMPLEX/C PO, Take 1 tablet by mouth daily., Disp: , Rfl:   No Known Allergies  Objective: Vascular Examination: Capillary refill time <3 seconds x 10 digits.  Dorsalis pedis pulses faintly palpable b/l.  Posterior tibial pulses palpable on the right;  nonpalpable on the left.  No digital hair x 10 digits.  Skin temperature WNL b/l.  Dermatological Examination: Skin thin, shiny and atrophic b/l.  Toenails 3-5 b/l discolored, thick, dystrophic with subungual debris and pain with palpation to nailbeds due to thickness of nails.  Anonychia b/l great and 2nd toes with evidence of permanent total nail avulsion. Nailbedscompletely epithelialized and intact.  Hyperkeratotic lesion distal tip right 3rd digit, submet head 1 b/l with tenderness to palpation. No edema, no erythema, no drainage, no flocculence.  Musculoskeletal: Muscle strength 5/5 to all LE muscle groups b/l.  Hammertoes 2-5 b/l.  Neurological: Sensation diminished b/l with 10 gram monofilament.  Vibratory sensation absent b/l.  Assessment: 1. Painful onychomycosis toenails 3-5 b/l 2. Calluses b/l hallux 3. Corn distal tip right 3rd digit 4. NIDDM with PAD and neuropathy  Plan: 1. Discuss diabetic foot care principles. Literature dispensed. 2. Toenails 1-5 b/l were debrided in length and girth without iatrogenic bleeding. 3. Hyperkeratotic lesions distal tip right 3rd digit, submet head 1 b/l pared with sterile chisel blade and gently smoothed with burr. 4. Patient to continue soft, supportive shoe gear daily. 5. Patient to report any pedal injuries to medical professional immediately. 6. Follow up 3 months.  7. Patient/POA to call should there be a concern in the interim.

## 2019-08-08 ENCOUNTER — Encounter (HOSPITAL_COMMUNITY): Payer: Medicare Other | Admitting: Internal Medicine

## 2019-08-15 ENCOUNTER — Encounter (HOSPITAL_COMMUNITY): Payer: Self-pay | Admitting: Internal Medicine

## 2019-08-15 ENCOUNTER — Ambulatory Visit (HOSPITAL_COMMUNITY)
Admission: RE | Admit: 2019-08-15 | Discharge: 2019-08-15 | Disposition: A | Discharge status: Home / Self Care | Payer: Medicare Other | Source: Ambulatory Visit | Attending: Internal Medicine | Admitting: Internal Medicine

## 2019-08-15 ENCOUNTER — Other Ambulatory Visit: Payer: Self-pay

## 2019-08-15 ENCOUNTER — Ambulatory Visit (HOSPITAL_BASED_OUTPATIENT_CLINIC_OR_DEPARTMENT_OTHER)
Admission: RE | Admit: 2019-08-15 | Discharge: 2019-08-15 | Disposition: A | Discharge status: Home / Self Care | Payer: Medicare Other | Source: Ambulatory Visit | Attending: Internal Medicine | Admitting: Internal Medicine

## 2019-08-15 VITALS — BP 140/82 | HR 64 | Wt 246.0 lb

## 2019-08-15 DIAGNOSIS — Z833 Family history of diabetes mellitus: Secondary | ICD-10-CM | POA: Insufficient documentation

## 2019-08-15 DIAGNOSIS — Z9981 Dependence on supplemental oxygen: Secondary | ICD-10-CM | POA: Diagnosis not present

## 2019-08-15 DIAGNOSIS — I739 Peripheral vascular disease, unspecified: Secondary | ICD-10-CM | POA: Diagnosis not present

## 2019-08-15 DIAGNOSIS — Z7984 Long term (current) use of oral hypoglycemic drugs: Secondary | ICD-10-CM | POA: Insufficient documentation

## 2019-08-15 DIAGNOSIS — I10 Essential (primary) hypertension: Secondary | ICD-10-CM | POA: Diagnosis not present

## 2019-08-15 DIAGNOSIS — E1151 Type 2 diabetes mellitus with diabetic peripheral angiopathy without gangrene: Secondary | ICD-10-CM | POA: Insufficient documentation

## 2019-08-15 DIAGNOSIS — M4326 Fusion of spine, lumbar region: Secondary | ICD-10-CM | POA: Diagnosis not present

## 2019-08-15 DIAGNOSIS — Z87891 Personal history of nicotine dependence: Secondary | ICD-10-CM | POA: Diagnosis not present

## 2019-08-15 DIAGNOSIS — Z79899 Other long term (current) drug therapy: Secondary | ICD-10-CM | POA: Insufficient documentation

## 2019-08-15 DIAGNOSIS — Z7982 Long term (current) use of aspirin: Secondary | ICD-10-CM | POA: Diagnosis not present

## 2019-08-15 DIAGNOSIS — Z8249 Family history of ischemic heart disease and other diseases of the circulatory system: Secondary | ICD-10-CM | POA: Insufficient documentation

## 2019-08-15 DIAGNOSIS — Z7902 Long term (current) use of antithrombotics/antiplatelets: Secondary | ICD-10-CM | POA: Diagnosis not present

## 2019-08-15 DIAGNOSIS — I110 Hypertensive heart disease with heart failure: Secondary | ICD-10-CM | POA: Diagnosis not present

## 2019-08-15 DIAGNOSIS — I5032 Chronic diastolic (congestive) heart failure: Secondary | ICD-10-CM

## 2019-08-15 DIAGNOSIS — J449 Chronic obstructive pulmonary disease, unspecified: Secondary | ICD-10-CM | POA: Insufficient documentation

## 2019-08-15 DIAGNOSIS — M48061 Spinal stenosis, lumbar region without neurogenic claudication: Secondary | ICD-10-CM | POA: Insufficient documentation

## 2019-08-15 DIAGNOSIS — Z809 Family history of malignant neoplasm, unspecified: Secondary | ICD-10-CM | POA: Diagnosis not present

## 2019-08-15 DIAGNOSIS — I2510 Atherosclerotic heart disease of native coronary artery without angina pectoris: Secondary | ICD-10-CM

## 2019-08-15 DIAGNOSIS — Z955 Presence of coronary angioplasty implant and graft: Secondary | ICD-10-CM | POA: Diagnosis not present

## 2019-08-15 DIAGNOSIS — E1136 Type 2 diabetes mellitus with diabetic cataract: Secondary | ICD-10-CM | POA: Diagnosis not present

## 2019-08-15 DIAGNOSIS — G4733 Obstructive sleep apnea (adult) (pediatric): Secondary | ICD-10-CM | POA: Insufficient documentation

## 2019-08-15 DIAGNOSIS — E785 Hyperlipidemia, unspecified: Secondary | ICD-10-CM | POA: Insufficient documentation

## 2019-08-15 DIAGNOSIS — I071 Rheumatic tricuspid insufficiency: Secondary | ICD-10-CM | POA: Diagnosis not present

## 2019-08-15 DIAGNOSIS — I5043 Acute on chronic combined systolic (congestive) and diastolic (congestive) heart failure: Secondary | ICD-10-CM

## 2019-08-15 LAB — BASIC METABOLIC PANEL
Anion gap: 10 (ref 5–15)
BUN: 29 mg/dL — ABNORMAL HIGH (ref 8–23)
CO2: 22 mmol/L (ref 22–32)
Calcium: 9.9 mg/dL (ref 8.9–10.3)
Chloride: 105 mmol/L (ref 98–111)
Creatinine, Ser: 1.43 mg/dL — ABNORMAL HIGH (ref 0.61–1.24)
GFR calc Af Amer: 56 mL/min — ABNORMAL LOW (ref >60)
GFR calc non Af Amer: 48 mL/min — ABNORMAL LOW (ref >60)
Glucose, Bld: 173 mg/dL — ABNORMAL HIGH (ref 70–99)
Potassium: 4.9 mmol/L (ref 3.5–5.1)
Sodium: 137 mmol/L (ref 135–145)

## 2019-08-15 NOTE — Progress Notes (Signed)
  Echocardiogram 2D Echocardiogram has been performed.  Jennette Dubin 08/15/2019, 10:42 AM

## 2019-08-15 NOTE — Progress Notes (Signed)
Acute Heart Failure Clinic Note    Date:  08/15/2019   ID:  Anthony [name], DOB 1946-06-02, MRN [id number]  Location: Home  Provider location: [facility] Type of Visit: Established patient  PCP:  [name], MD  Cardiologist:  No primary care provider on file. Primary HF: Anthony [name]  Chief Complaint: Heart Failure follow-up   History of Present Illness:  Anthony [name] is a 73 y.o. male with h/o obesity, HTN, HL, severe osteoarthritis, DM2 (diagnosed 2009), OSA (intolerant of CPAP), former smoker (quit 1989), CAD s/p stent 2001 and PAD. He is the father-in-law of Dr. [name] in Interventional Radiology.    Had cath in [place] in 2001. Was told he had 70% blockage in one artery and the one in the back was totally blocked. Eventually underwent stenting.   Has severe PAD R>L followed by Dr. [name]. Now status post right external iliac, common femoral, superficial femoral endarterectomy with bovine pericardial patch angioplasty on 10/10/2014  Saw Dr. Halford Chessman and placed on home O2 but now weaned off with inhalers. Checks sats with pulse oximeter  Was having more SOB and some CP. Underwent repeat cath 05/11/19  1. Left dominant coronary system with 2V CAD 2. Small dominant RCA with chronic proximal total occlusion 3. Widely patent dominant LCX 4. High grade 95% proximal LAD likely within or just prior to previous stent. Probable chronic occlusion of distal LAD but not seen well -> PCI/DES to prox LAD and POBA of D1 5. LVEF 60-65% with normal filling pressures 6. Normal right heart pressures   Ao = 89/49 (65) LV = 88/6 RA = 4  RV = 27/5 PA = 28/4 (15) PCW = 13 Fick cardiac output/index = 4.9/2.2 PVR = < 1.0 WU Ao sat = 98% PA sat = 69%, 70%  Here for routine f/u. Still with SOB on mild to moderate activity. Says legs started to bother and then gets tired and SOB. He has been seeing [facility] and is pending selective nerve root block for pain  management at the end of September. Wants to stop Plavix for a week. No orthopnea or PND. Using CPAP regularly.    PFTs with moderate COPD 8/13 FEV1 1.83 (62%) FVC   2.84 (66%) FEF 25-75% 0.79 (29%) DLCO 67  ECHO 10/13 EF 50-55%  ECHO 5/16: EF 45-50% mold to moderate RV dysfunction Grade II DD  CXR with elevated R hemidiaphragm. Sleep study with severe OS - AHI 39.    Anthony [name] denies symptoms worrisome for COVID 19.   Past Medical History:  Diagnosis Date   Arthritis    CAD (coronary artery disease)    stent placed 06/02/2000   Chronic respiratory failure with hypoxia ([place]) 08/11/2012   Congestive heart failure (HCC)    COPD (chronic obstructive pulmonary disease) ([place]) 08/11/2012   Diabetes mellitus ([place])    Hyperlipidemia    Hypertension    Lumbar stenosis    OSA (obstructive sleep apnea) 08/11/2012   cpcap   Peripheral vascular disease ([place])    Pneumonia 1999   Shortness of breath    with exertion   Past Surgical History:  Procedure Laterality Date   ABDOMINAL ANGIOGRAM  Dec. 4, 2013   ABDOMINAL AORTAGRAM N/A 11/29/2012   Procedure: ABDOMINAL Maxcine Ham;  Surgeon: Serafina Mitchell, MD;  Location: [facility] C[facility] LAB;  Service: Cardiovascular;  Laterality: N/A;   ABDOMINAL AORTAGRAM N/A 09/03/2014   Procedure: ABDOMINAL Maxcine Ham;  Surgeon: Butch Penny  [name], MD;  Location: [facility] CATH LAB;  Service: Cardiovascular;  Laterality: N/A;   ANGIOPLASTY  06/02/2000   Stent   ANTERIOR LATERAL LUMBAR FUSION 4 LEVELS Right 05/28/2014   Procedure: Right L4-5 L3-4 L2-3, L1-2  Anterior lateral lumbar fusion with percutaneaous pedicle screws. Lumbar four/five, three/four, two/three and possible two/one;  Surgeon: Erline Levine, MD;  Location: [facility] NEURO ORS;  Service: Neurosurgery;  Laterality: Right;  Lumbar One-Five Fusion with Percutaneous Screws   [place]   right foot   CARDIAC CATHETERIZATION     CARPAL TUNNEL RELEASE  09/30/2006   right wrist    CERVICAL FUSION  Nov 2002   COLONOSCOPY W/ BIOPSIES AND POLYPECTOMY     benign   CORONARY STENT INTERVENTION N/A 05/11/2019   Procedure: CORONARY STENT INTERVENTION;  Surgeon: Burnell Blanks, MD;  Location: [facility] CV LAB;  Service: Cardiovascular;  Laterality: N/A;   ENDARTERECTOMY FEMORAL Right 10/10/2014   Procedure: RIGHT FEMORAL ARTERY ENDARTERECTOMY  WITH VASCU GUARD PATCH ANGIOPLASTY;  Surgeon: Serafina Mitchell, MD;  Location: [facility];  Service: Vascular;  Laterality: Right;   EYE SURGERY Bilateral 2014   cataracts   HAMMER TOE SURGERY  June 2007 & August 2008   left foot   HAND ARTHROPLASTY  Oct 2010   right thumb   Heart catherization  03/26/1999   LUMBAR DISC SURGERY  Dec 1997 & Ampril 2005   LUMBAR PERCUTANEOUS PEDICLE SCREW 4 LEVEL N/A 05/28/2014   Procedure: LUMBAR PERCUTANEOUS PEDICLE SCREW 4 LEVEL;  Surgeon: Erline Levine, MD;  Location: [facility];  Service: Neurosurgery;  Laterality: N/A;   [place]   right foot   PERIPHERAL VASCULAR CATHETERIZATION N/A 12/07/2016   Procedure: Abdominal Aortogram w/Lower Extremity;  Surgeon: Serafina Mitchell, MD;  Location: [facility] CV LAB;  Service: Cardiovascular;  Laterality: N/A;   RIGHT/LEFT HEART CATH AND CORONARY ANGIOGRAPHY N/A 05/11/2019   Procedure: RIGHT/LEFT HEART CATH AND CORONARY ANGIOGRAPHY;  Surgeon: Jolaine Artist, MD;  Location: [facility] CV LAB;  Service: Cardiovascular;  Laterality: N/A;   SPINE SURGERY  04/21/2004   Lower back disk gurgery- Lumbar stenosis   TONSILLECTOMY  1965     Current Outpatient Medications  Medication Sig Dispense Refill   albuterol (PROAIR HFA) 108 (90 Base) MCG/ACT inhaler Inhale 2 puffs into the lungs every 6 (six) hours as needed for wheezing or shortness of breath. 3 Inhaler 3   aspirin 81 MG tablet Take 1 tablet (81 mg total) by mouth daily. 90 tablet 1   ASPIRIN LOW DOSE 81 MG EC tablet Take 81 mg by mouth daily.     B Complex-Folic Acid  (BENFOTIAMINE MULTI-B PO) Take 1 tablet by mouth 2 (two) times daily.      bisoprolol (ZEBETA) 5 MG tablet TAKE 1 TABLET BY MOUTH  DAILY (Patient taking differently: Take 5 mg by mouth every evening. ) 90 tablet 1   CINNAMON PO Take 2,000 mg by mouth every evening.      clopidogrel (PLAVIX) 75 MG tablet Take 1 tablet (75 mg total) by mouth daily with breakfast. 90 tablet 3   diclofenac sodium (VOLTAREN) 1 % GEL Apply 1 application topically daily as needed (back pain).     doxazosin (CARDURA) 2 MG tablet Take 2 mg by mouth every evening.      doxycycline (ADOXA) 50 MG tablet Take 50 mg by mouth daily at 2 PM.      empagliflozin (JARDIANCE) 10 MG  TABS tablet Take 10 mg by mouth daily.     fish oil-omega-3 fatty acids 1000 MG capsule Take 3 g by mouth daily.      fluticasone (FLONASE) 50 MCG/ACT nasal spray Place 2 sprays into both nostrils as needed for allergies or rhinitis.     furosemide (LASIX) 20 MG tablet Take 20 mg by mouth 2 (two) times daily.      GARLIC PO Take 5,361 mg by mouth daily.      glipiZIDE (GLUCOTROL XL) 10 MG 24 hr tablet Take 10 mg by mouth daily with breakfast.     hydrocortisone (ANUSOL-HC) 2.5 % rectal cream Place 1 application rectally 2 (two) times daily. 30 g 1   isosorbide mononitrate (IMDUR) 30 MG 24 hr tablet TAKE 1 TABLET BY MOUTH  DAILY (Patient taking differently: Take 30 mg by mouth daily. ) 90 tablet 1   L-Lysine 1000 MG TABS Take 1,000 mg by mouth daily.      losartan (COZAAR) 100 MG tablet Take 100 mg by mouth daily.     metFORMIN (GLUCOPHAGE) 1000 MG tablet Take 1,000 mg by mouth 2 (two) times daily with a meal.     Multiple Vitamin (MULTIVITAMIN) tablet Take 1 tablet by mouth daily.     nitroGLYCERIN (NITROSTAT) 0.4 MG SL tablet Place 1 tablet (0.4 mg total) under the tongue every 5 (five) minutes as needed for chest pain. 90 tablet 3   pentoxifylline (TRENTAL) 400 MG CR tablet TAKE 1 TABLET BY MOUTH  TWICE A DAY 180 tablet 3    potassium chloride SA (K-DUR,KLOR-CON) 20 MEQ tablet Take 20 mEq by mouth 2 (two) times daily.     rOPINIRole (REQUIP) 1 MG tablet Take 1 mg by mouth every evening.      simvastatin (ZOCOR) 40 MG tablet Take 40 mg by mouth every evening.     sitaGLIPtin (JANUVIA) 100 MG tablet Take 100 mg by mouth daily.     SPIRIVA HANDIHALER 18 MCG inhalation capsule INHALE THE CONTENTS OF 1  CAPSULE VIA HANDIHALER  DAILY (Patient taking differently: Place 18 mcg into inhaler and inhale daily. ) 90 capsule 3   SUPER B COMPLEX/C PO Take 1 tablet by mouth daily.     No current facility-administered medications for this encounter.     Allergies:   Patient has no known allergies.   Social History:  The patient  reports that he quit smoking about 32 years ago. His smoking use included cigarettes. He has a 56.00 pack-year smoking history. He has never used smokeless tobacco. He reports current alcohol use. He reports that he does not use drugs.   Family History:  The patient's family history includes Cancer in his father and mother; Deep vein thrombosis in his mother; Diabetes in his mother; Heart attack in his father; Heart disease (age of onset: 40) in his father; Hyperlipidemia in his father and mother; Hypertension in his father and mother.   ROS:  Please see the history of present illness.   All other systems are personally reviewed and negative.   Exam:  General:  Well appearing. No resp difficulty HEENT: normal Neck: supple. no JVD. Carotids 2+ bilat; no bruits. No lymphadenopathy or thryomegaly appreciated. Cor: PMI nondisplaced. Regular rate & rhythm. No rubs, gallops or murmurs. Lungs: clear Abdomen: obese soft, nontender, nondistended. No hepatosplenomegaly. No bruits or masses. Good bowel sounds. Extremities: no cyanosis, clubbing, rash, tr edema Neuro: alert & orientedx3, cranial nerves grossly intact. moves all 4 extremities w/o difficulty.  Affect pleasant   Recent Labs: 05/08/2019: BUN  29; Creatinine, Ser 1.61; Platelets 142 05/11/2019: Hemoglobin 14.3; Potassium 4.9; Sodium 137  Personally reviewed   Wt Readings from Last 3 Encounters:  08/15/19 111.6 kg (246 lb)  06/07/19 111.7 kg (246 lb 4 oz)  06/06/19 112 kg (247 lb)      ASSESSMENT AND PLAN:  1) CAD - Last cath 05/11/19 with PCI LAD with DES and POBA of D1 - Cont DAPT and statin - Followed by Dr. Virgina Jock - Prefer he not come off Plavix for at least 6 months after stent (so that would be 11/11/19)   2) Chronic Diastolic HF-   - EF 15-37% with mild RV dysfunction on ECHO 5/16. - Echo today EF 50% RV ok. Personally reviewed - NYHA III, Volume status looks good today and on RHC in 5/20 - Continue lasix 20 mg BID  3) PAD - s/p femoral artery endarterectomy in 2015. Followed by [facility]. Per Dr [name] - still with 1-2 block claudication - pending nerve root block. Prefer he not come off Plavix for at least 6 months after stent (so that would be 11/11/19). If this doesn't improve pain will see Dr. [name] back  4) HTN - Blood pressure well controlled. Continue current regimen.  5) OSA on CPAP - Is complaint with CPAP  6) Hyperlipidemia - Followed by Dr. Virgina Jock. Continue statin. Goal LDL < 70  7) COPD - Follows with Dr Halford Chessman   Signed, Glori Bickers, MD  08/15/2019 11:19 AM  Advanced Heart Failure [place] [phone] (office) [phone] (fax)

## 2019-08-15 NOTE — Addendum Note (Signed)
Encounter addended by: Chari Manning, CMA on: 08/15/2019 11:37 AM  Actions taken: Visit diagnoses modified, Order list changed, Diagnosis association updated

## 2019-08-15 NOTE — Addendum Note (Signed)
Encounter addended by: Chari Manning, CMA on: 08/15/2019 11:41 AM  Actions taken: Charge Capture section accepted

## 2019-08-15 NOTE — Addendum Note (Signed)
Encounter addended by: Chari Manning, CMA on: 08/15/2019 11:41 AM  Actions taken: Clinical Note Signed

## 2019-08-15 NOTE — Patient Instructions (Signed)
Labs were drawn today. We ONLY call you if something is ABNORMAL. NO CALL IS A GOOD CALL!!  Follow up in 6 months with Dr. [name]. The office will give you a call to set this up. If the office doesn't call you around January PLEASE [place].  At the [facility], you and your health needs are our priority. As part of our continuing mission to provide you with exceptional heart care, we have created designated Provider Care Teams. These Care Teams include your primary Cardiologist (physician) and Advanced Practice Providers (APPs- Physician Assistants and Nurse Practitioners) who all work together to provide you with the care you need, when you need it.   You may see any of the following providers on your designated Care Team at your next follow up: Marland Kitchen Dr Glori Bickers . Dr Loralie Champagne . Darrick Grinder, NP   Please be sure to bring in all your medications bottles to every appointment.

## 2019-08-16 ENCOUNTER — Encounter (HOSPITAL_COMMUNITY): Payer: Self-pay | Admitting: *Deleted

## 2019-08-16 NOTE — Progress Notes (Signed)
Pt brought a form from the [place] [facility] to his appt on 8/19.  Form completed, signed by Dr [name] and faxed to Selena Lesser at [phone].  Copy mailed to pt and original sent to medical records, message sent to pt via MyChart to let him know this was done.

## 2019-08-22 ENCOUNTER — Ambulatory Visit (INDEPENDENT_AMBULATORY_CARE_PROVIDER_SITE_OTHER): Payer: Medicare Other | Admitting: Gastroenterology

## 2019-08-22 ENCOUNTER — Encounter: Payer: Self-pay | Admitting: Gastroenterology

## 2019-08-22 VITALS — BP 138/84 | HR 71 | Temp 98.2°F | Ht 69.0 in | Wt 248.0 lb

## 2019-08-22 DIAGNOSIS — Z7902 Long term (current) use of antithrombotics/antiplatelets: Secondary | ICD-10-CM

## 2019-08-22 DIAGNOSIS — K644 Residual hemorrhoidal skin tags: Secondary | ICD-10-CM

## 2019-08-22 NOTE — Patient Instructions (Signed)
You will be referred to [facility] for intermittent bleeding hemorrhoids  We have given you samples of the following medication to take: Fiber Choice chew two tabs daily  Thank you for entrusting me with your care and choosing [facility].  Dr Ardis Hughs

## 2019-08-22 NOTE — Progress Notes (Signed)
Review of pertinent gastrointestinal problems: 1.FH of colon cancer (father, diagnosed in his 70s)Colonoscopy 2006;our records only show two color pictures from this report with no text. Colonoscopy 2011Dr. Boutt [facility]. Diverticulosis was found. Onesmall (removed with biopsy forceps)hyperplastic by pathology polyps wasfound.Colonoscopy Dr. Ardis Hughs October 2037found left-sided diverticulosis, a single 4 mm polyp was removed from the sigmoid colon, it was a tubular adenoma and I recommended that he have a repeat colonoscopy at 5-year interval. 2.  Rectal bleeding, mucus discharge April 2020.  Telemedicine visit, I recommended repeating his colonoscopy when he returned from [place] and to try fiber supplements in the meantime.  Started Plavix due to coronary artery disease, stenting May 2020, Plavix obviously contributes to his blood loss anoscopy Dr. Arelia Longest June 2020 found grade 1-2 inflamed external hemorrhoids, no obvious internal hemorrhoids.  There was some fresh blood at the site.  Treated with hydrocortisone cream.  HPI: This is a very pleasant 73 year old man who has intermittently bleeding external anal hemorrhoids Chief complaint is rectal bleeding  He took hydrocortisone cream for about 10 days to 2 weeks and then stopped.  He was on fiber supplements for about 10 days also.  He continues to have intermittent rectal bleeding.  This occurs 3-4 times per month.  States his underpants, stains his sheets.  In between these he will have often mucus discharge.  He has no anal discomfort.  No abdominal pains.  ROS: complete GI ROS as described in HPI, all other review negative.  Constitutional:  No unintentional weight loss   Past Medical History:  Diagnosis Date  . Arthritis   . CAD (coronary artery disease)    stent placed 06/02/2000  . Chronic respiratory failure with hypoxia ([place]) 08/11/2012  . Congestive heart failure ([place])   . COPD (chronic obstructive pulmonary  disease) ([place]) 08/11/2012  . Diabetes mellitus ([place])   . Hyperlipidemia   . Hypertension   . Lumbar stenosis   . OSA (obstructive sleep apnea) 08/11/2012   cpcap  . Peripheral vascular disease ([place])   . Pneumonia 1999  . Shortness of breath    with exertion    Past Surgical History:  Procedure Laterality Date  . ABDOMINAL ANGIOGRAM  Dec. 4, 2013  . ABDOMINAL AORTAGRAM N/A 11/29/2012   Procedure: ABDOMINAL Maxcine Ham;  Surgeon: Serafina Mitchell, MD;  Location: [facility] CATH LAB;  Service: Cardiovascular;  Laterality: N/A;  . ABDOMINAL AORTAGRAM N/A 09/03/2014   Procedure: ABDOMINAL AORTAGRAM;  Surgeon: Serafina Mitchell, MD;  Location: [facility] CATH LAB;  Service: Cardiovascular;  Laterality: N/A;  . ANGIOPLASTY  06/02/2000   Stent  . ANTERIOR LATERAL LUMBAR FUSION 4 LEVELS Right 05/28/2014   Procedure: Right L4-5 L3-4 L2-3, L1-2  Anterior lateral lumbar fusion with percutaneaous pedicle screws. Lumbar four/five, three/four, two/three and possible two/one;  Surgeon: Erline Levine, MD;  Location: [place] [facility] NEURO ORS;  Service: Neurosurgery;  Laterality: Right;  Lumbar One-Five Fusion with Percutaneous Screws  . [place]   right foot  . CARDIAC CATHETERIZATION    . CARPAL TUNNEL RELEASE  09/30/2006   right wrist  . CERVICAL FUSION  Nov 2002  . COLONOSCOPY W/ BIOPSIES AND POLYPECTOMY     benign  . CORONARY STENT INTERVENTION N/A 05/11/2019   Procedure: CORONARY STENT INTERVENTION;  Surgeon: Burnell Blanks, MD;  Location: [facility] CV LAB;  Service: Cardiovascular;  Laterality: N/A;  . ENDARTERECTOMY FEMORAL Right 10/10/2014   Procedure: RIGHT FEMORAL ARTERY ENDARTERECTOMY  WITH VASCU GUARD PATCH  ANGIOPLASTY;  Surgeon: Serafina Mitchell, MD;  Location: [facility];  Service: Vascular;  Laterality: Right;  . EYE SURGERY Bilateral 2014   cataracts  . HAMMER TOE SURGERY  June 2007 & August 2008   left foot  . HAND ARTHROPLASTY  Oct 2010   right thumb  . Heart catherization  03/26/1999  . [place] SURGERY  Dec 1997 & Ampril 2005  . LUMBAR PERCUTANEOUS PEDICLE SCREW 4 LEVEL N/A 05/28/2014   Procedure: LUMBAR PERCUTANEOUS PEDICLE SCREW 4 LEVEL;  Surgeon: Erline Levine, MD;  Location: [place] NEURO ORS;  Service: Neurosurgery;  Laterality: N/A;  . [place]   right foot  . PERIPHERAL VASCULAR CATHETERIZATION N/A 12/07/2016   Procedure: Abdominal Aortogram w/Lower Extremity;  Surgeon: Serafina Mitchell, MD;  Location: [facility] CV LAB;  Service: Cardiovascular;  Laterality: N/A;  . RIGHT/LEFT HEART CATH AND CORONARY ANGIOGRAPHY N/A 05/11/2019   Procedure: RIGHT/LEFT HEART CATH AND CORONARY ANGIOGRAPHY;  Surgeon: Jolaine Artist, MD;  Location: [facility] CV LAB;  Service: Cardiovascular;  Laterality: N/A;  . SPINE SURGERY  04/21/2004   Lower back disk gurgery- Lumbar stenosis  . TONSILLECTOMY  1965    Current Outpatient Medications  Medication Sig Dispense Refill  . albuterol (PROAIR HFA) 108 (90 Base) MCG/ACT inhaler Inhale 2 puffs into the lungs every 6 (six) hours as needed for wheezing or shortness of breath. 3 Inhaler 3  . aspirin 81 MG tablet Take 1 tablet (81 mg total) by mouth daily. 90 tablet 1  . ASPIRIN LOW DOSE 81 MG EC tablet Take 81 mg by mouth daily.    . B Complex-Folic Acid (BENFOTIAMINE MULTI-B PO) Take 1 tablet by mouth 2 (two) times daily.     . bisoprolol (ZEBETA) 5 MG tablet TAKE 1 TABLET BY MOUTH  DAILY (Patient taking differently: Take 5 mg by mouth every evening. ) 90 tablet 1  . CINNAMON PO Take 2,000 mg by mouth every evening.     . clopidogrel (PLAVIX) 75 MG tablet Take 1 tablet (75 mg total) by mouth daily with breakfast. 90 tablet 3  . diclofenac sodium (VOLTAREN) 1 % GEL Apply 1 application topically daily as needed (back pain).    Marland Kitchen doxazosin (CARDURA) 2 MG tablet Take 2 mg by mouth every evening.     Marland Kitchen doxycycline (ADOXA) 50 MG tablet Take 50 mg by mouth daily at 2 PM.     . empagliflozin (JARDIANCE) 10 MG TABS tablet Take 10 mg by mouth  daily.    . fish oil-omega-3 fatty acids 1000 MG capsule Take 3 g by mouth daily.     . fluticasone (FLONASE) 50 MCG/ACT nasal spray Place 2 sprays into both nostrils as needed for allergies or rhinitis.    . furosemide (LASIX) 20 MG tablet Take 20 mg by mouth 2 (two) times daily.     Marland Kitchen GARLIC PO Take [id number] mg by mouth daily.     Marland Kitchen glipiZIDE (GLUCOTROL XL) 10 MG 24 hr tablet Take 10 mg by mouth daily with breakfast.    . hydrocortisone (ANUSOL-HC) 2.5 % rectal cream Place 1 application rectally 2 (two) times daily. 30 g 1  . isosorbide mononitrate (IMDUR) 30 MG 24 hr tablet TAKE 1 TABLET BY MOUTH  DAILY (Patient taking differently: Take 30 mg by mouth daily. ) 90 tablet 1  . L-Lysine 1000 MG TABS Take 1,000 mg by mouth daily.     Marland Kitchen losartan (COZAAR) 100 MG tablet Take 100  mg by mouth daily.    . metFORMIN (GLUCOPHAGE) 1000 MG tablet Take 1,000 mg by mouth 2 (two) times daily with a meal.    . Multiple Vitamin (MULTIVITAMIN) tablet Take 1 tablet by mouth daily.    . pentoxifylline (TRENTAL) 400 MG CR tablet TAKE 1 TABLET BY MOUTH  TWICE A DAY 180 tablet 3  . potassium chloride SA (K-DUR,KLOR-CON) 20 MEQ tablet Take 20 mEq by mouth 2 (two) times daily.    Marland Kitchen rOPINIRole (REQUIP) 1 MG tablet Take 1 mg by mouth every evening.     . simvastatin (ZOCOR) 40 MG tablet Take 40 mg by mouth every evening.    . sitaGLIPtin (JANUVIA) 100 MG tablet Take 100 mg by mouth daily.    Marland Kitchen SPIRIVA HANDIHALER 18 MCG inhalation capsule INHALE THE CONTENTS OF 1  CAPSULE VIA HANDIHALER  DAILY (Patient taking differently: Place 18 mcg into inhaler and inhale daily. ) 90 capsule 3  . SUPER B COMPLEX/C PO Take 1 tablet by mouth daily.    . nitroGLYCERIN (NITROSTAT) 0.4 MG SL tablet Place 1 tablet (0.4 mg total) under the tongue every 5 (five) minutes as needed for chest pain. 90 tablet 3   No current facility-administered medications for this visit.     Allergies as of 08/22/2019  . (No Known Allergies)    Family  History  Problem Relation Age of Onset  . Heart disease Father 38  . Cancer Father   . Hyperlipidemia Father   . Hypertension Father   . Heart attack Father   . Cancer Mother   . Deep vein thrombosis Mother        Varicose veins  . Diabetes Mother   . Hyperlipidemia Mother   . Hypertension Mother     Social History   Socioeconomic History  . Marital status: Married    Spouse name: Not on file  . Number of children: Not on file  . Years of education: Not on file  . Highest education level: Not on file  Occupational History  . Occupation: retired  Scientific laboratory technician  . Financial resource strain: Not on file  . Food insecurity    Worry: Not on file    Inability: Not on file  . Transportation needs    Medical: Not on file    Non-medical: Not on file  Tobacco Use  . Smoking status: Former Smoker    Packs/day: 2.00    Years: 28.00    Pack years: 56.00    Types: Cigarettes    Quit date: 08/03/1987    Years since quitting: 32.0  . Smokeless tobacco: Never Used  Substance and Sexual Activity  . Alcohol use: Yes    Comment: weekly  . Drug use: No  . Sexual activity: Not on file  Lifestyle  . Physical activity    Days per week: Not on file    Minutes per session: Not on file  . Stress: Not on file  Relationships  . Social Herbalist on phone: Not on file    Gets together: Not on file    Attends religious service: Not on file    Active member of club or organization: Not on file    Attends meetings of clubs or organizations: Not on file    Relationship status: Not on file  . Intimate partner violence    Fear of current or ex partner: Not on file    Emotionally abused: Not on file  Physically abused: Not on file    Forced sexual activity: Not on file  Other Topics Concern  . Not on file  Social History Narrative  . Not on file     Physical Exam: BP 138/84 (BP Location: Left Arm, Patient Position: Sitting)   Pulse 71   Temp 98.2 F (36.8 C) (Temporal)    Ht 5\' 9"  (1.753 m)   Wt 248 lb (112.5 kg)   SpO2 91%   BMI 36.62 kg/m  Constitutional: generally well-appearing Psychiatric: alert and oriented x3 Abdomen: soft, nontender, nondistended, no obvious ascites, no peritoneal signs, normal bowel sounds No peripheral edema noted in lower extremities Rectal examination, small amount of brown stool and mucus coating small nonthrombosed external anal hemorrhoids, no blood  Assessment and plan: 73 y.o. male with bleeding external anal hemorrhoids, on Plavix  He does not have a internal hemorrhoids that we can see.  I recommended that he resume fiber supplements once daily.  This may help with some of the mucus discharge.  He understands the Plavix daily use which he needs for coronary artery disease undoubtedly increases the amount of blood that he sees.  I suspect that if he were not on Plavix he would only have very minor intermittent bleeding.  As it is however the bleeding that he has is pretty troubling, stains that sheets, stains his underpants.  I am going to arrange for [facility] [place] [facility] to consider external hemorrhoid therapy options, he understands that while he is on Plavix this might be tricky.  Please see the "Patient Instructions" section for addition details about the plan.  Owens Loffler, MD [facility] 08/22/2019, 8:45 AM

## 2019-09-13 ENCOUNTER — Telehealth (HOSPITAL_COMMUNITY): Payer: Self-pay

## 2019-09-13 NOTE — Telephone Encounter (Signed)
Phone call was returned to wife. She wanted to know how to get some medical records that the [facility] needs. Explained process and confirmed that she understood what to do.

## 2019-09-18 ENCOUNTER — Other Ambulatory Visit (HOSPITAL_COMMUNITY): Payer: Self-pay | Admitting: Internal Medicine

## 2019-09-19 ENCOUNTER — Telehealth (HOSPITAL_COMMUNITY): Payer: Self-pay

## 2019-09-19 NOTE — Telephone Encounter (Signed)
Received a Medical Records request from [facility] on 09/18/2019. Faxed and mailed request on 09/19/2019. Called Pt. And he asked me to speak with spouse , Anthony [name], informed her that everything was faxed and mailed out today.

## 2019-09-24 ENCOUNTER — Telehealth (HOSPITAL_COMMUNITY): Payer: Self-pay | Admitting: *Deleted

## 2019-09-24 NOTE — Telephone Encounter (Signed)
Received fax from [facility], pt is sch for bilateral T12-L1 TFESI on 11/23 w/Dr Maryjean Ka and they need clearance for pt to hold Plavix 7 days prior.  Per Dr [name]: "Yes medication may be discontinued as stated above"  Note faxed back to them at [phone]

## 2019-10-22 ENCOUNTER — Encounter: Payer: Self-pay | Admitting: Pulmonary Disease

## 2019-10-22 ENCOUNTER — Other Ambulatory Visit: Payer: Self-pay

## 2019-10-22 ENCOUNTER — Ambulatory Visit (INDEPENDENT_AMBULATORY_CARE_PROVIDER_SITE_OTHER): Payer: Medicare Other

## 2019-10-22 ENCOUNTER — Ambulatory Visit (INDEPENDENT_AMBULATORY_CARE_PROVIDER_SITE_OTHER): Payer: Medicare Other | Admitting: Pulmonary Disease

## 2019-10-22 VITALS — BP 128/70 | HR 68 | Temp 97.7°F | Ht 69.0 in | Wt 241.0 lb

## 2019-10-22 DIAGNOSIS — G4733 Obstructive sleep apnea (adult) (pediatric): Secondary | ICD-10-CM | POA: Diagnosis not present

## 2019-10-22 DIAGNOSIS — R0609 Other forms of dyspnea: Secondary | ICD-10-CM

## 2019-10-22 DIAGNOSIS — R0600 Dyspnea, unspecified: Secondary | ICD-10-CM | POA: Diagnosis not present

## 2019-10-22 DIAGNOSIS — J986 Disorders of diaphragm: Secondary | ICD-10-CM

## 2019-10-22 DIAGNOSIS — J449 Chronic obstructive pulmonary disease, unspecified: Secondary | ICD-10-CM | POA: Diagnosis not present

## 2019-10-22 NOTE — Progress Notes (Signed)
[facility]  Chief Complaint  Patient presents with  . OSA on CPAP    Constitutional:  BP 128/70 (BP Location: Right Arm, Patient Position: Sitting, Cuff Size: Normal)   Pulse 68   Temp 97.7 F (36.5 C)   Ht 5\' 9"  (1.753 m)   Wt 241 lb (109.3 kg)   SpO2 97% Comment: on room air  BMI 35.59 kg/m   Past Medical History:  CAD, HLD, combined CHF, PAD, HTN, DM, Lumbar stenosis  Brief Summary:  Anthony [name] is a 73 y.o. male with COPD, OSA, and rt diaphragm elevation.  He noticed having more trouble with his breathing while walking since returning from [place] in May.  Also has pain in his legs with exertion.  Found to have CAD and had RHC/LHC with DES to LAD.  Also seen by vascular surgery.  Still having breathing issues and pain in legs with exertion.  These seem to coincide.  He isn't having much cough, wheeze, sputum, or chest congestion.  Not currently having chest pain or leg swelling.  Uses CPAP nightly.  Had tried several different masks recently before settling on one that he likes.  Not having sinus congestion, sore throat, dry mouth, or aerophagia.  He walked in office today.  Was only able to walk 2 laps due to leg pain.  Maintained SpO2 > 94% on room air with exertion.  Physical Exam:   Appearance - well kempt   ENMT - no sinus tenderness, no nasal discharge, no oral exudate  Neck - no masses, trachea midline, no thyromegaly, no elevation in JVP  Respiratory - normal appearance of chest wall, normal respiratory effort w/o accessory muscle use, no dullness on percussion, no wheezing or rales  CV - s1s2 regular rate and rhythm, no murmurs, no peripheral edema, radial pulses symmetric  GI - soft, non tender  Lymph - no adenopathy noted in neck and axillary areas  MSK - normal gait  Ext - no cyanosis, clubbing, or joint inflammation noted  Skin - no rashes, lesions, or ulcers  Neuro - normal strength, oriented x 3   Psych - normal mood and affect   BMP Latest Ref Rng & Units 08/15/2019 05/11/2019 05/11/2019  Glucose 70 - 99 mg/dL 173(H) - -  BUN 8 - 23 mg/dL 29(H) - -  Creatinine 0.61 - 1.24 mg/dL 1.43(H) - -  Sodium 135 - 145 mmol/L 137 137 138  Potassium 3.5 - 5.1 mmol/L 4.9 4.9 4.6  Chloride 98 - 111 mmol/L 105 - -  CO2 22 - 32 mmol/L 22 - -  Calcium 8.9 - 10.3 mg/dL 9.9 - -    CBC Latest Ref Rng & Units 05/11/2019 05/11/2019 05/11/2019  WBC 4.0 - 10.5 K/uL - - -  Hemoglobin 13.0 - 17.0 g/dL 14.3 13.6 13.6  Hematocrit 39.0 - 52.0 % 42.0 40.0 40.0  Platelets 150 - 400 K/uL - - -    Assessment/Plan:   Dyspnea on exertion. - had cardiac intervention in May 2020 - still has symptoms of DOE and claudication  COPD with chronic bronchitis. - repeat CXR and PFT to assess whether COPD progression is contributing to dyspnea - continue spiriva and prn albuterol for now  OSA. - he is compliant with CPAP and reports benefit - continue CPAP 13 cm H2O  CPAP rhinitis. - prn flonase  Rt hemidiaphragm elevation. - repeat CXR today  CAD, chronic diastolic CHF. - followed by Dr. [name] with cardiology  Peripheral artery disease. - followed by Dr. [name] with vascular surgery    Patient Instructions  Chest xray today Will schedule pulmonary function test  Follow up in 4 weeks   [name], MD [place] Pager: [phone] 10/22/2019, 9:35 AM  Flow Sheet    Pulmonary tests:  PFT 08/13/12>>FEV1 1.83 (62%), FEV1% 64, TLC 5.17 (82%), DLCO 67%, +BD SNIFF test 08/14/12>>Paradoxical motion of the right hemidiaphragm with inspiration  Sleep tests:  PSG 08/14/12>>AHI 38.5. CPAP 13 cm H2O with 1 liter oxygen ONO with CPAP and RA 01/18/13 >> test time 10 hrs 37 min. Mean SpO2 94%, low SpO2 86%. Spent 16 sec with SpO2 < 88% CPAP 09/19/19 to 10/18/19 >> used on 30 of 30 nights with average 9 hrs 4 min.  Average AHI 1.1 with CPAP 13 cm H2O  Cardiac tests:   RHC/LHC 05/11/19 >> RA 4, RV 27/5, PA 28/4(15), PCW 13, CI 2.2, PVR < 1 WU; 2v CAD, chronic proximal RCA total occlusion, angioplasty to diagonal branch, DES to proximal LAD Echo 08/15/19 >> EF 55 to 60%, mild LVH  Medications:   Allergies as of 10/22/2019   No Known Allergies     Medication List       Accurate as of October 22, 2019  9:35 AM. If you have any questions, ask your nurse or doctor.        STOP taking these medications   glipiZIDE 10 MG 24 hr tablet Commonly known as: GLUCOTROL XL Stopped by: [name], MD   hydrocortisone 2.5 % rectal cream Commonly known as: Anusol-HC Stopped by: [name], MD   sitaGLIPtin 100 MG tablet Commonly known as: JANUVIA Stopped by: [name], MD     TAKE these medications   albuterol 108 (90 Base) MCG/ACT inhaler Commonly known as: ProAir HFA Inhale 2 puffs into the lungs every 6 (six) hours as needed for wheezing or shortness of breath.   Aspirin Low Dose 81 MG EC tablet Generic drug: aspirin Take 81 mg by mouth daily. What changed: Another medication with the same name was removed. Continue taking this medication, and follow the directions you see here. Changed by: [name], MD   BENFOTIAMINE MULTI-B PO Take 1 tablet by mouth 2 (two) times daily.   bisoprolol 5 MG tablet Commonly known as: ZEBETA TAKE 1 TABLET BY MOUTH  DAILY What changed: when to take this   CINNAMON PO Take 2,000 mg by mouth every evening.   clopidogrel 75 MG tablet Commonly known as: PLAVIX Take 1 tablet (75 mg total) by mouth daily with breakfast.   diclofenac sodium 1 % Gel Commonly known as: VOLTAREN Apply 1 application topically daily as needed (back pain).   doxazosin 2 MG tablet Commonly known as: CARDURA Take 2 mg by mouth every evening.   doxycycline 50 MG tablet Commonly known as: ADOXA Take 50 mg by mouth daily at 2 PM.   fish oil-omega-3 fatty acids 1000 MG capsule Take 3 g by mouth daily.   fluticasone 50 MCG/ACT  nasal spray Commonly known as: FLONASE Place 2 sprays into both nostrils as needed for allergies or rhinitis.   furosemide 20 MG tablet Commonly known as: LASIX Take 20 mg by mouth 2 (two) times daily.   GARLIC PO Take [id number] mg by mouth daily.   isosorbide mononitrate 30 MG 24 hr tablet Commonly known as: IMDUR TAKE 1 TABLET BY MOUTH  DAILY   Jardiance 10 MG Tabs tablet Generic drug: empagliflozin Take 10 mg  by mouth daily.   L-Lysine 1000 MG Tabs Take 1,000 mg by mouth daily.   losartan 100 MG tablet Commonly known as: COZAAR Take 100 mg by mouth daily.   metFORMIN 1000 MG tablet Commonly known as: GLUCOPHAGE Take 1,000 mg by mouth 2 (two) times daily with a meal.   multivitamin tablet Take 1 tablet by mouth daily.   nitroGLYCERIN 0.4 MG SL tablet Commonly known as: NITROSTAT Place 1 tablet (0.4 mg total) under the tongue every 5 (five) minutes as needed for chest pain.   Ozempic (1 MG/DOSE) 2 MG/1.5ML Sopn Generic drug: Semaglutide (1 MG/DOSE) 0.5 mg weekly   pentoxifylline 400 MG CR tablet Commonly known as: TRENTAL TAKE 1 TABLET BY MOUTH  TWICE A DAY   potassium chloride SA 20 MEQ tablet Commonly known as: KLOR-CON Take 20 mEq by mouth 2 (two) times daily.   rOPINIRole 1 MG tablet Commonly known as: REQUIP Take 1 mg by mouth every evening.   simvastatin 40 MG tablet Commonly known as: ZOCOR Take 40 mg by mouth every evening.   Spiriva HandiHaler 18 MCG inhalation capsule Generic drug: tiotropium INHALE THE CONTENTS OF 1  CAPSULE VIA HANDIHALER  DAILY What changed: See the new instructions.   SUPER B COMPLEX/C PO Take 1 tablet by mouth daily.       Past Surgical History:  He  has a past surgical history that includes Tonsillectomy (1965); Bunionectomy ([place]); Neuroma surgery (1996); Lumbar disc surgery (Dec 1997 & Ampril 2005); Cervical fusion (Nov 2002); Hammer toe surgery (June 2007 & August 2008); Carpal tunnel release (09/30/2006);  Hand Arthroplasty (Oct 2010); Heart catherization (03/26/1999); Angioplasty (06/02/2000); Spine surgery (04/21/2004); Abdominal angiogram (Dec. 4, 2013); Cardiac catheterization; Anterior lateral lumbar fusion 4 levels (Right, 05/28/2014); Lumbar percutaneous pedicle screw 4 level (Anthony/A, 05/28/2014); Eye surgery (Bilateral, 2014); Colonoscopy w/ biopsies and polypectomy; Endarterectomy femoral (Right, 10/10/2014); abdominal aortagram (Anthony/A, 11/29/2012); abdominal aortagram (Anthony/A, 09/03/2014); Cardiac catheterization (Anthony/A, 12/07/2016); RIGHT/LEFT HEART CATH AND CORONARY ANGIOGRAPHY (Anthony/A, 05/11/2019); and CORONARY STENT INTERVENTION (Anthony/A, 05/11/2019).  Family History:  His family history includes Cancer in his father and mother; Deep vein thrombosis in his mother; Diabetes in his mother; Heart attack in his father; Heart disease (age of onset: 6) in his father; Hyperlipidemia in his father and mother; Hypertension in his father and mother.  Social History:  He  reports that he quit smoking about 32 years ago. His smoking use included cigarettes. He has a 56.00 pack-year smoking history. He has never used smokeless tobacco. He reports current alcohol use. He reports that he does not use drugs.

## 2019-10-22 NOTE — Patient Instructions (Signed)
Chest xray today Will schedule pulmonary function test  Follow up in 4 weeks

## 2019-10-23 ENCOUNTER — Telehealth: Payer: Self-pay | Admitting: Pulmonary Disease

## 2019-10-23 NOTE — Telephone Encounter (Signed)
Dg Chest 2 View  Result Date: 10/22/2019 CLINICAL DATA:  Shortness of breath with exertion.  COPD EXAM: CHEST - 2 VIEW COMPARISON:  None. FINDINGS: Elevation of the right hemidiaphragm, stable. No confluent opacities or effusions. Heart is normal size. No acute bony abnormality. IMPRESSION: Stable elevation of the right hemidiaphragm.  No active disease. Electronically Signed   By: Rolm Baptise M.D.   On: 10/22/2019 09:55     Please let him know that his CXR shows stable changes from history of right diaphragm elevation.  No change to current treatment plan.

## 2019-10-25 NOTE — Telephone Encounter (Signed)
Called patient and advised of results. Patient voiced understanding. Nothing further needed at this time.

## 2019-11-07 ENCOUNTER — Other Ambulatory Visit: Payer: Self-pay

## 2019-11-07 ENCOUNTER — Ambulatory Visit (INDEPENDENT_AMBULATORY_CARE_PROVIDER_SITE_OTHER): Payer: Medicare Other | Admitting: Podiatry

## 2019-11-07 ENCOUNTER — Encounter: Payer: Self-pay | Admitting: Podiatry

## 2019-11-07 DIAGNOSIS — L84 Corns and callosities: Secondary | ICD-10-CM

## 2019-11-07 DIAGNOSIS — M79674 Pain in right toe(s): Secondary | ICD-10-CM | POA: Diagnosis not present

## 2019-11-07 DIAGNOSIS — E1151 Type 2 diabetes mellitus with diabetic peripheral angiopathy without gangrene: Secondary | ICD-10-CM

## 2019-11-07 DIAGNOSIS — M79675 Pain in left toe(s): Secondary | ICD-10-CM

## 2019-11-07 DIAGNOSIS — E1142 Type 2 diabetes mellitus with diabetic polyneuropathy: Secondary | ICD-10-CM

## 2019-11-07 DIAGNOSIS — B351 Tinea unguium: Secondary | ICD-10-CM | POA: Diagnosis not present

## 2019-11-07 NOTE — Patient Instructions (Signed)

## 2019-11-12 ENCOUNTER — Telehealth (HOSPITAL_COMMUNITY): Payer: Self-pay | Admitting: *Deleted

## 2019-11-12 NOTE — Telephone Encounter (Signed)
Pt stopped Plavix yesterday because it was 6 months after stent. Patients wife left VM askingif he should stay on 81mg  of aspirin or increase to 325mg  of aspirin.  Routed to [facility] for advice

## 2019-11-13 NOTE — Telephone Encounter (Signed)
81 mg aspirin is fine. If he is not going to have back surgery or injection then he should stay on plavix. If he is having a procedure hold plavix for now and restart a few days after the procedure

## 2019-11-13 NOTE — Telephone Encounter (Signed)
pts wife aware and agreeable with plan. 

## 2019-11-14 NOTE — Progress Notes (Signed)
Subjective: Anthony [name] is seen for diabetic foot care today for follow up of painful corn and  elongated, thickened toenails 1-5 b/l feet that he cannot cut. Pain interferes with daily activities. Aggravating factor includes wearing enclosed shoe gear and relieved with periodic debridement.  He and his wife will be going to [place] for several months for the winter season.  No Known Allergies   Objective:  Vascular Examination: Capillary refill time <3 seconds x 10 digits.  Dorsalis pedis pulses faintly palpable b/l.  Posterior tibial pulses palpable on right LE;  Nonpalpable on left LE.  Digital hair absent b/l.  Skin temperature gradient WNL b/l.   Dermatological Examination: Skin thin, shiny and atrophic b/l.  Anonychia b/l great and 2nd toes with evidence of permanent total nail avulsion. Nailbed(s) completely epithelialized and intact.  Toenails 3-5 b/l discolored, thick, dystrophic with subungual debris and pain with palpation to nailbeds due to thickness of nails.  Hyperkeratotic lesion distal tip right 3rd digit. No erythema, no edema, no drainage, no flocculence noted.  Musculoskeletal: Muscle strength 5/5 to all LE muscle groups b/l.  Hammertoe deformity 2-5 b/l.  No pain, crepitus or joint limitation noted with ROM.   Neurological Examination: Protective sensation diminished with 10 gram monofilament bilaterally.  Assessment: Painful onychomycosis toenails 3-5 b/l  Corn right 3rd digit NIDDM with PAD and neuropathy  Plan: 1. Continue diabetic foot care principles. 2. Toenails 1-5 b/l were debrided in length and girth without iatrogenic bleeding.  3. Corn(s) pared right 3rd digit utilizing sterile scalpel blade without incident. 4. Patient to continue soft, supportive shoe gear daily. 5. Patient to report any pedal injuries to medical professional immediately. 6. Follow up 6 months per patient request. He will be going to [place] for several months. He  has local Podiatrist there as well. 7. Patient/POA to call should there be a concern in the interim.

## 2019-11-15 ENCOUNTER — Other Ambulatory Visit (HOSPITAL_COMMUNITY): Payer: Self-pay | Admitting: Internal Medicine

## 2019-11-27 IMAGING — MR MRI LUMBAR SPINE WITHOUT AND WITH CONTRAST
4 of 8 series · 26 of 48 positions shown · IV contrast (10 ml Multihance)
Comparison: Plain films 12/04/2018.MRI lumbar spine 05/04/2014.

CLINICAL DATA: Low back pain. BILATERAL leg pain. Previous surgery.

EXAM:
MRI LUMBAR SPINE WITHOUT AND WITH CONTRAST
TECHNIQUE: Multiplanar and multiecho pulse sequences of the lumbar spine were
obtained without and with intravenous contrast.
CONTRAST:  10mL MULTIHANCE GADOBENATE DIMEGLUMINE 529 MG/ML IV SOLN

[Series 4: T1 · sagittal · 4.0mm · 0.59mm/px · 3 of 15 slices shown (1 of 2)]
[im 1/15]
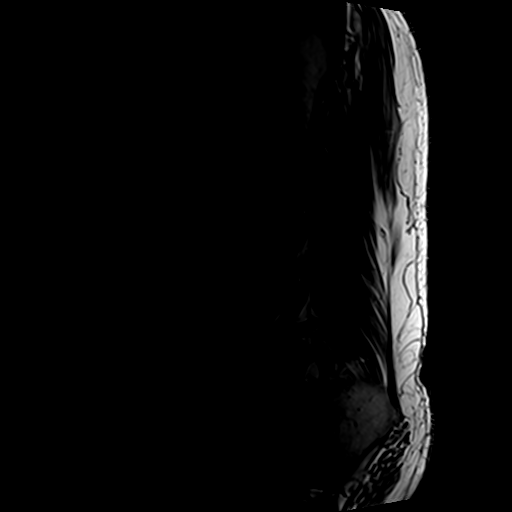
[im 8/15]
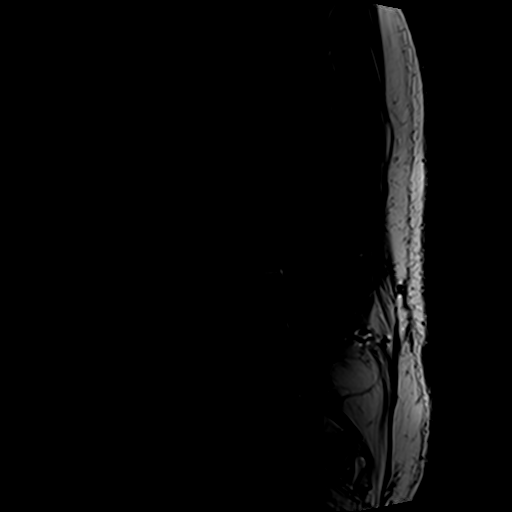
[im 15/15]
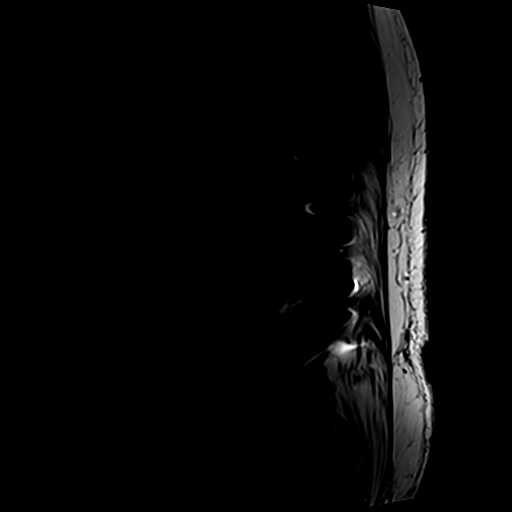

[Series 5: T2 post-contrast · sagittal · 4.0mm · 0.59mm/px · 4 of 15 slices shown]
[im 1/15]
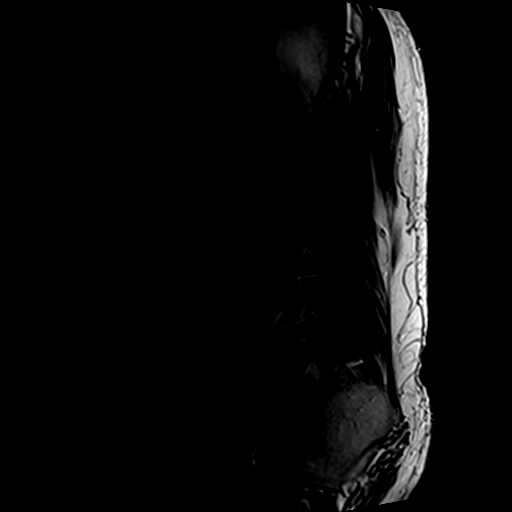
[im 5/15]
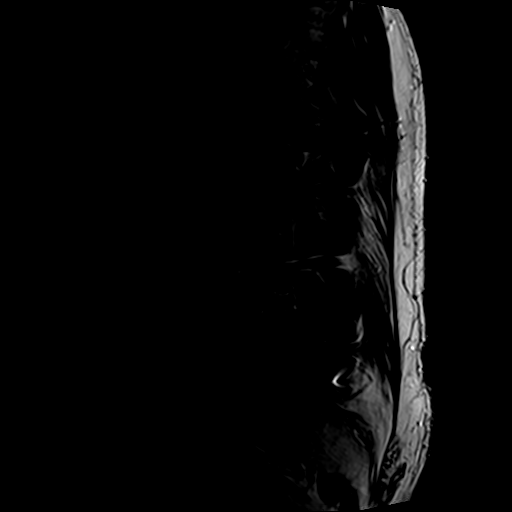
[im 10/15]
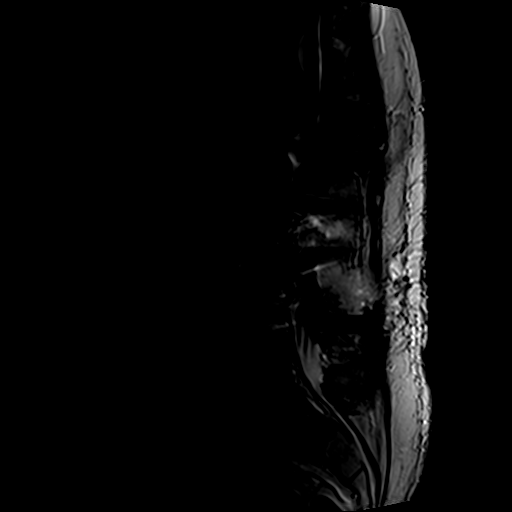
[im 15/15]
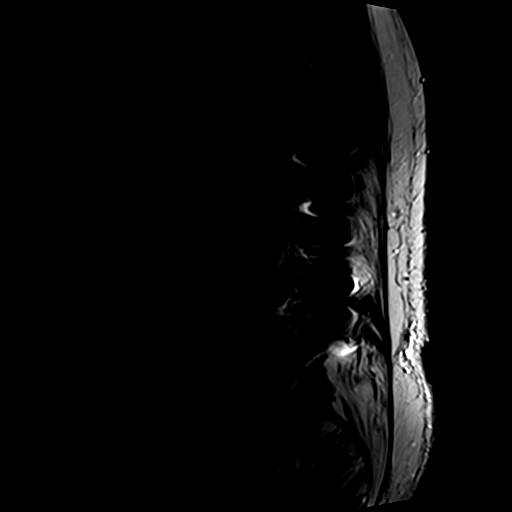

[Series 6: T2 · axial · 4.0mm · 0.70mm/px · z∈[-123,+109]mm · 11 of 46 slices shown]
[im 1/46]
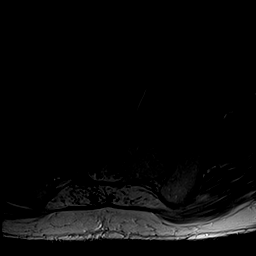
[im 5/46]
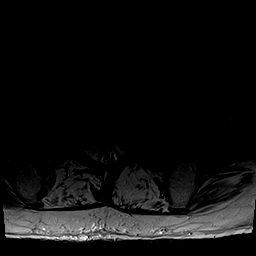
[im 10/46]
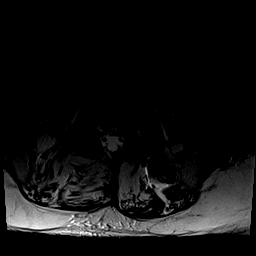
[im 14/46]
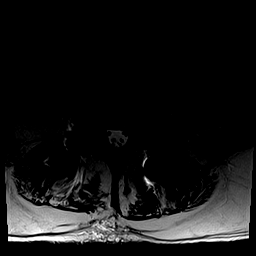
[im 19/46]
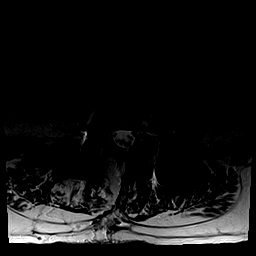
[im 23/46]
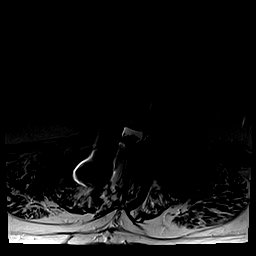
[im 28/46]
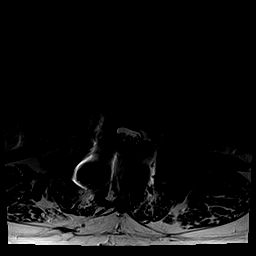
[im 32/46]
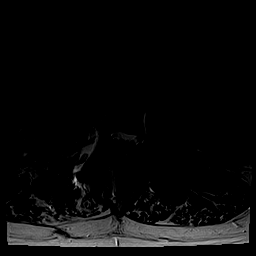
[im 37/46]
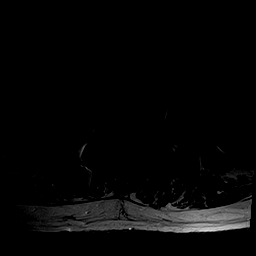
[im 41/46]
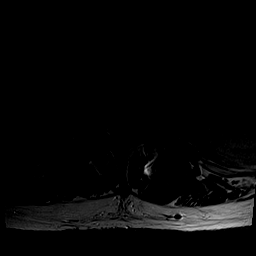
[im 46/46]
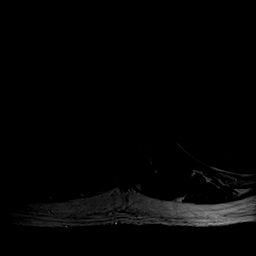

[Series 7: T1 · axial · 4.0mm · 0.35mm/px · z∈[-123,+109]mm · 8 of 31 slices shown (2 of 2)]
[im 1/31]
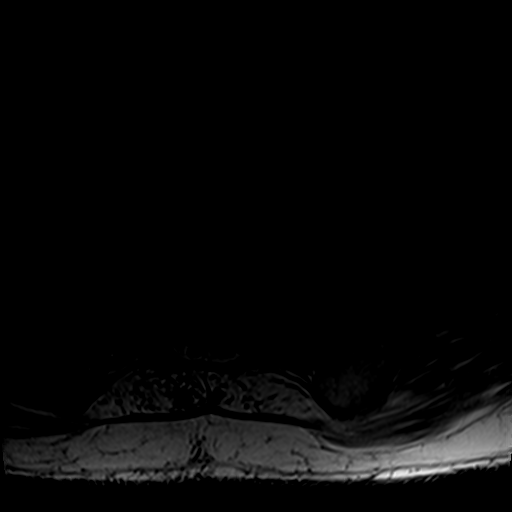
[im 5/31]
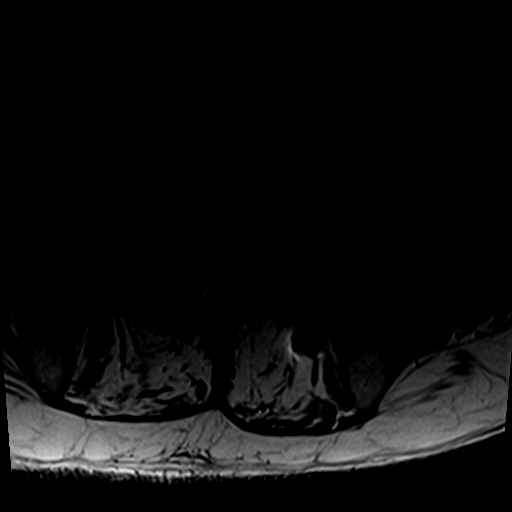
[im 9/31]
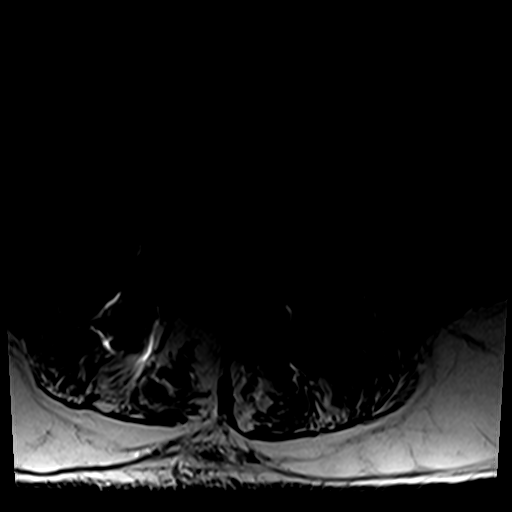
[im 13/31]
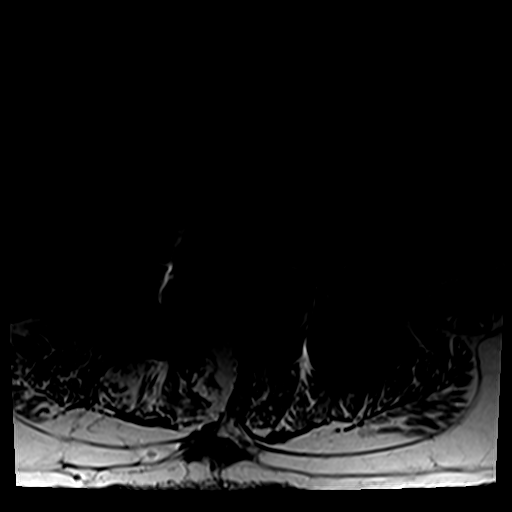
[im 18/31]
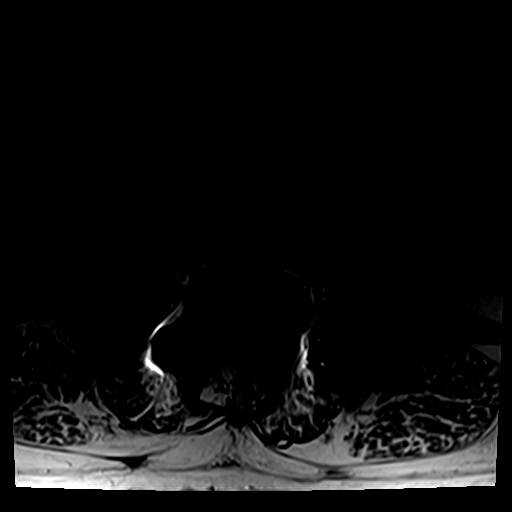
[im 22/31]
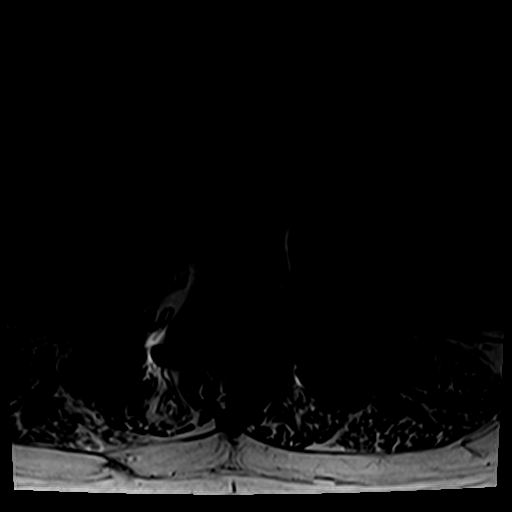
[im 26/31]
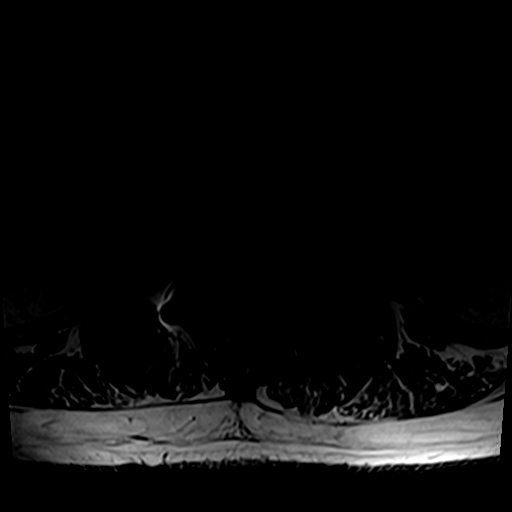
[im 31/31]
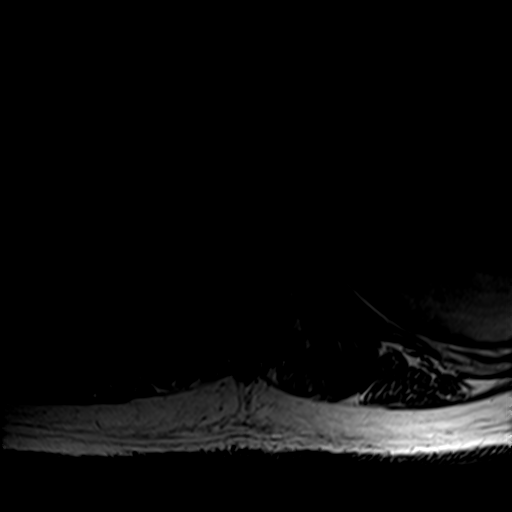

[26 of 48 positions shown; findings below may reference images not displayed]

FINDINGS: Segmentation:  Standard.

Alignment: Straightening of the normal lumbar lordosis without
significant retrolisthesis or anterolisthesis. Continued mild
levoconvex scoliosis. Patient is status post RIGHT anterolateral L1
through L5 interbody fusion, augmented with pedicle screw construct,
for correction/improved of scoliosis and sagittal imbalance.

Vertebrae:  No fracture, evidence of discitis, or bone lesion.

Conus medullaris and cauda equina: Conus extends to the L1 level.
Conus and cauda equina appear normal.

Paraspinal and other soft tissues: Negative.

Disc levels:

L1-L2: T2 and STIR hyperintense fluid surrounds the intervertebral
cage, solid interbody arthrodesis is not established. No
impingement.

L2-L3: T2 and STIR hyperintense fluid surrounds the intervertebral
cage, solid interbody arthrodesis is not established. Posterior
element hypertrophy, with possible solid arthrodesis across the
RIGHT facet joint. Mild stenosis. Subarticular zone narrowing
potentially affects the RIGHT L3 nerve root.

L3-L4: T2 and STIR hyperintense fluid surrounds the intervertebral
cage, and solid interbody arthrodesis is not established. RIGHT
laminotomy. Mild stenosis. BILATERAL subarticular zone and foraminal
zone narrowing could affect the L3 and L4 nerve roots.

L4-L5: Status post laminectomy. No central canal stenosis. T2 and
STIR hyperintensity surrounds the intervertebral cage, and solid
interbody arthrodesis not established. Residual disc material
appears to extend into both foramina, although none is present
centrally. Subarticular zone and foraminal zone narrowing could
affect the L4 and L5 nerve roots. This may be slightly worse on the
LEFT.

L5-S1: Unremarkable disc space. Facet arthropathy is worse on the
LEFT. No definite impingement.
IMPRESSION: Status post L1 through L5 XLIF. Mild residual scoliosis, based on
sagittal and axial imaging.

T2 and STIR hyperintense fluid surrounds the intervertebral cages
from L1 through L5. Solid interbody arthrodesis not established.
Consider CT lumbar spine without contrast.

Mild stenosis at L2-3 and L3-4. Potentially symptomatic subarticular
zone narrowing at L2-3, L3-4 and L4-5 see discussion above.

## 2019-11-28 ENCOUNTER — Ambulatory Visit (HOSPITAL_COMMUNITY)
Admission: RE | Admit: 2019-11-28 | Discharge: 2019-11-28 | Disposition: A | Discharge status: Home / Self Care | Payer: Medicare Other | Source: Ambulatory Visit | Attending: Pulmonary Disease | Admitting: Pulmonary Disease

## 2019-11-28 ENCOUNTER — Ambulatory Visit (INDEPENDENT_AMBULATORY_CARE_PROVIDER_SITE_OTHER): Payer: Medicare Other | Admitting: Pulmonary Disease

## 2019-11-28 ENCOUNTER — Encounter: Payer: Self-pay | Admitting: Pulmonary Disease

## 2019-11-28 ENCOUNTER — Other Ambulatory Visit: Payer: Self-pay

## 2019-11-28 VITALS — BP 130/72 | HR 83 | Temp 97.4°F | Ht 69.0 in | Wt 225.0 lb

## 2019-11-28 DIAGNOSIS — R0609 Other forms of dyspnea: Secondary | ICD-10-CM

## 2019-11-28 DIAGNOSIS — J986 Disorders of diaphragm: Secondary | ICD-10-CM | POA: Diagnosis not present

## 2019-11-28 DIAGNOSIS — G4733 Obstructive sleep apnea (adult) (pediatric): Secondary | ICD-10-CM

## 2019-11-28 DIAGNOSIS — R0600 Dyspnea, unspecified: Secondary | ICD-10-CM | POA: Diagnosis present

## 2019-11-28 DIAGNOSIS — J449 Chronic obstructive pulmonary disease, unspecified: Secondary | ICD-10-CM

## 2019-11-28 LAB — PULMONARY FUNCTION TEST
DL/VA % pred: 89 %
DL/VA: 3.55 ml/min/mmHg/L
DLCO unc % pred: 70 %
DLCO unc: 17.17 ml/min/mmHg
FEF 25-75 Post: 1.65 L/sec
FEF 25-75 Pre: 1.55 L/sec
FEF2575-%Change-Post: 6 %
FEF2575-%Pred-Post: 77 %
FEF2575-%Pred-Pre: 72 %
FEV1-%Change-Post: 1 %
FEV1-%Pred-Post: 78 %
FEV1-%Pred-Pre: 76 %
FEV1-Post: 2.3 L
FEV1-Pre: 2.26 L
FEV1FVC-%Change-Post: 2 %
FEV1FVC-%Pred-Pre: 100 %
FEV6-%Change-Post: 0 %
FEV6-%Pred-Post: 80 %
FEV6-%Pred-Pre: 79 %
FEV6-Post: 3.06 L
FEV6-Pre: 3.03 L
FEV6FVC-%Change-Post: 1 %
FEV6FVC-%Pred-Post: 105 %
FEV6FVC-%Pred-Pre: 104 %
FVC-%Change-Post: 0 %
FVC-%Pred-Post: 75 %
FVC-%Pred-Pre: 76 %
FVC-Post: 3.08 L
FVC-Pre: 3.09 L
Post FEV1/FVC ratio: 75 %
Post FEV6/FVC ratio: 99 %
Pre FEV1/FVC ratio: 73 %
Pre FEV6/FVC Ratio: 98 %
RV % pred: 107 %
RV: 2.68 L
TLC % pred: 85 %
TLC: 5.84 L

## 2019-11-28 MED ORDER — ANORO ELLIPTA 62.5-25 MCG/INH IN AEPB: 1.0000 | INHALATION_SPRAY | Freq: Every day | RESPIRATORY_TRACT | 0 refills | Status: DC

## 2019-11-28 MED ORDER — ALBUTEROL SULFATE (2.5 MG/3ML) 0.083% IN NEBU
2.5000 mg | INHALATION_SOLUTION | Freq: Once | RESPIRATORY_TRACT | Status: AC
  Administered 2019-11-28: 11:00:00 2.5 mg via RESPIRATORY_TRACT

## 2019-11-28 MED ORDER — ANORO ELLIPTA 62.5-25 MCG/INH IN AEPB: 1.0000 | INHALATION_SPRAY | Freq: Every day | RESPIRATORY_TRACT | 5 refills | Status: DC

## 2019-11-28 NOTE — Patient Instructions (Signed)
Stop spiriva  Start anoro one puff daily  Call or email in a couple of weeks after starting anoro to update response  Follow up in Spring of 2021 when your return from [place]

## 2019-11-28 NOTE — Progress Notes (Signed)
Anthony [name], Critical Care, and Sleep Medicine  Chief Complaint  Patient presents with  . Results    PFT at [facility]  . Sleep Apnea    Constitutional:  BP 130/72 (BP Location: Right Arm, Patient Position: Sitting, Cuff Size: Normal)   Pulse 83   Temp (!) 97.4 F (36.3 C)   Ht 5\' 9"  (1.753 m)   Wt 225 lb (102.1 kg)   SpO2 100% Comment: on room air  BMI 33.23 kg/m   Past Medical History:  CAD, HLD, combined CHF, PAD, HTN, DM, Lumbar stenosis  Brief Summary:  Anthony [name] is a 73 y.o. male with COPD, OSA, and rt diaphragm elevation.  He had PFT earlier today.  Showed mild diffusion defect but no significant progression compared to 2013.  CXR from 10/22/19 showed stable Rt diaphragm elevation.  He has appointment with vascular surgery pending.  He had steroid injection in his back, and this help with leg pains some.  Still feels short of breath with activity.  Felt that albuterol nebulizer with PFT helped some.  He has changed his diet and has lost some weight.  Physical Exam:   Appearance - well kempt   ENMT - no sinus tenderness, no nasal discharge, no oral exudate  Neck - no masses, trachea midline, no thyromegaly, no elevation in JVP  Respiratory - normal appearance of chest wall, normal respiratory effort w/o accessory muscle use, no dullness on percussion, no wheezing or rales  CV - s1s2 regular rate and rhythm, no murmurs, no peripheral edema, radial pulses symmetric  GI - soft, non tender  Lymph - no adenopathy noted in neck and axillary areas  MSK - normal gait  Ext - no cyanosis, clubbing, or joint inflammation noted  Skin - no rashes, lesions, or ulcers  Neuro - normal strength, oriented x 3  Psych - normal mood and affect    Assessment/Plan:   COPD with chronic bronchitis. - will try him on anoro in place of spiriva - continue prn albuterol  Dyspnea on exertion. - if he continues to have trouble with his breathing after trial of  anoro, then he might need additional testing with CT chest and CPST; would need to coordinate timing of this for when he returns from [place]  Obstructive sleep apnea. - he is compliant with CPAP and reports benefit - continue CPAP 13 cm H2O  CPAP rhinitis. - prn flonase  Rt hemidiaphragm elevation. - stable findings on most recent CXR and no significant restriction on PFT   CAD, chronic diastolic CHF. - followed by Dr. Haroldine [name] with cardiology  Peripheral artery disease. - followed by Dr. Trula [name] with vascular surgery    Patient Instructions  Stop spiriva  Start anoro one puff daily  Call or email in a couple of weeks after starting anoro to update response  Follow up in Spring of 2021 when your return from [place]   A total of  27 minutes were spent face to face with the patient and more than half of that time involved counseling or coordination of care.   Anthony Mires, MD [place] [name]/Critical Care Pager: [phone] 11/28/2019, 2:44 PM  Flow Sheet    [name] tests:  PFT 08/13/12>>FEV1 1.83 (62%), FEV1% 64, TLC 5.17 (82%), DLCO 67%, +BD PFT 11/28/19 >> FEV1 2.30 (78%), FEV1% 75, TLC 5.84 (85%), DLCO 70%, no BD  Chest imaging:  SNIFF test 08/14/12>>Paradoxical motion of the right hemidiaphragm with inspiration  Sleep tests:  PSG 08/14/12>>AHI 38.5. CPAP 13 cm  H2O with 1 liter oxygen ONO with CPAP and RA 01/18/13 >> test time 10 hrs 37 min. Mean SpO2 94%, low SpO2 86%. Spent 16 sec with SpO2 < 88% CPAP 10/28/19 to 11/26/19 >> used on 30 of 30 nights with average 9 hrs 13 min.  Average AHI 1.2 with CPAP 13 cm H2O.  Cardiac tests:  RHC/LHC 05/11/19 >> RA 4, RV 27/5, PA 28/4(15), PCW 13, CI 2.2, PVR < 1 WU; 2v CAD, chronic proximal RCA total occlusion, angioplasty to diagonal branch, DES to proximal LAD Echo 08/15/19 >> EF 55 to 60%, mild LVH  Medications:   Allergies as of 11/28/2019   No Known Allergies     Medication List       Accurate as of  November 28, 2019  2:44 PM. If you have any questions, ask your nurse or doctor.        STOP taking these medications   Spiriva HandiHaler 18 MCG inhalation capsule Generic drug: tiotropium Stopped by: Anthony Mires, MD     TAKE these medications   albuterol 108 (90 Base) MCG/ACT inhaler Commonly known as: ProAir HFA Inhale 2 puffs into the lungs every 6 (six) hours as needed for wheezing or shortness of breath.   Anoro Ellipta 62.5-25 MCG/INH Aepb Generic drug: umeclidinium-vilanterol Inhale 1 puff into the lungs daily. Started by: Anthony Mires, MD   Anoro Ellipta 62.5-25 MCG/INH Aepb Generic drug: umeclidinium-vilanterol Inhale 1 puff into the lungs daily. Started by: Anthony Mires, MD   Aspirin Low Dose 81 MG EC tablet Generic drug: aspirin Take 81 mg by mouth daily.   BENFOTIAMINE MULTI-B PO Take 1 tablet by mouth 2 (two) times daily.   bisoprolol 5 MG tablet Commonly known as: ZEBETA TAKE 1 TABLET BY MOUTH  DAILY   CINNAMON PO Take 2,000 mg by mouth every evening.   clopidogrel 75 MG tablet Commonly known as: PLAVIX Take 1 tablet (75 mg total) by mouth daily with breakfast.   diclofenac sodium 1 % Gel Commonly known as: VOLTAREN Apply 1 application topically daily as needed (back pain).   doxazosin 2 MG tablet Commonly known as: CARDURA Take 2 mg by mouth every evening.   doxycycline 50 MG tablet Commonly known as: ADOXA Take 50 mg by mouth daily at 2 PM.   fish oil-omega-3 fatty acids 1000 MG capsule Take 3 g by mouth daily.   fluticasone 50 MCG/ACT nasal spray Commonly known as: FLONASE Place 2 sprays into both nostrils as needed for allergies or rhinitis.   furosemide 20 MG tablet Commonly known as: LASIX Take 20 mg by mouth 2 (two) times daily.   GARLIC PO Take [id number] mg by mouth daily.   isosorbide mononitrate 30 MG 24 hr tablet Commonly known as: IMDUR TAKE 1 TABLET BY MOUTH  DAILY   Jardiance 10 MG Tabs tablet Generic drug: empagliflozin  Take 10 mg by mouth daily.   L-Lysine 1000 MG Tabs Take 1,000 mg by mouth daily.   losartan 100 MG tablet Commonly known as: COZAAR Take 100 mg by mouth daily.   metFORMIN 1000 MG tablet Commonly known as: GLUCOPHAGE Take 1,000 mg by mouth 2 (two) times daily with a meal.   multivitamin tablet Take 1 tablet by mouth daily.   nitroGLYCERIN 0.4 MG SL tablet Commonly known as: NITROSTAT Place 1 tablet (0.4 mg total) under the tongue every 5 (five) minutes as needed for chest pain.   Ozempic (1 MG/DOSE) 2 MG/1.5ML Sopn Generic drug: Semaglutide (1 MG/DOSE) 0.5  mg weekly   pentoxifylline 400 MG CR tablet Commonly known as: TRENTAL TAKE 1 TABLET BY MOUTH  TWICE A DAY   potassium chloride SA 20 MEQ tablet Commonly known as: KLOR-CON Take 20 mEq by mouth 2 (two) times daily.   rOPINIRole 1 MG tablet Commonly known as: REQUIP Take 1 mg by mouth every evening.   simvastatin 40 MG tablet Commonly known as: ZOCOR Take 40 mg by mouth every evening.   SUPER B COMPLEX/C PO Take 1 tablet by mouth daily.       Past Surgical History:  He  has a past surgical history that includes Tonsillectomy (1965); Bunionectomy ([place]); Neuroma surgery (1996); Lumbar disc surgery (Dec 1997 & Ampril 2005); Cervical fusion (Nov 2002); Hammer toe surgery (June 2007 & August 2008); Carpal tunnel release (09/30/2006); Hand Arthroplasty (Oct 2010); Heart catherization (03/26/1999); Angioplasty (06/02/2000); Spine surgery (04/21/2004); Abdominal angiogram (Dec. 4, 2013); Cardiac catheterization; Anterior lateral lumbar fusion 4 levels (Right, 05/28/2014); Lumbar percutaneous pedicle screw 4 level (N/A, 05/28/2014); Eye surgery (Bilateral, 2014); Colonoscopy w/ biopsies and polypectomy; Endarterectomy femoral (Right, 10/10/2014); abdominal aortagram (N/A, 11/29/2012); abdominal aortagram (N/A, 09/03/2014); Cardiac catheterization (N/A, 12/07/2016); RIGHT/LEFT HEART CATH AND CORONARY ANGIOGRAPHY (N/A, 05/11/2019);  and CORONARY STENT INTERVENTION (N/A, 05/11/2019).  Family History:  His family history includes Cancer in his father and mother; Deep vein thrombosis in his mother; Diabetes in his mother; Heart attack in his father; Heart disease (age of onset: 53) in his father; Hyperlipidemia in his father and mother; Hypertension in his father and mother.  Social History:  He  reports that he quit smoking about 32 years ago. His smoking use included cigarettes. He has a 56.00 pack-year smoking history. He has never used smokeless tobacco. He reports current alcohol use. He reports that he does not use drugs.

## 2020-05-02 ENCOUNTER — Telehealth (HOSPITAL_COMMUNITY): Payer: Self-pay | Admitting: *Deleted

## 2020-05-02 NOTE — Telephone Encounter (Signed)
Pts wife left VM stating pt has been on plavix for a while and was told he would eventually come off of it. Pt is almost due for a refill and pts wife wants to know before they refill his medication should he still be on plavix.   Routed to [place] for advice

## 2020-05-05 NOTE — Telephone Encounter (Signed)
Ok to stop

## 2020-05-06 ENCOUNTER — Other Ambulatory Visit: Payer: Self-pay

## 2020-05-06 ENCOUNTER — Ambulatory Visit (INDEPENDENT_AMBULATORY_CARE_PROVIDER_SITE_OTHER): Payer: Medicare Other | Admitting: Podiatry

## 2020-05-06 ENCOUNTER — Encounter: Payer: Self-pay | Admitting: Podiatry

## 2020-05-06 VITALS — Temp 97.6°F

## 2020-05-06 DIAGNOSIS — B351 Tinea unguium: Secondary | ICD-10-CM | POA: Diagnosis not present

## 2020-05-06 DIAGNOSIS — L84 Corns and callosities: Secondary | ICD-10-CM

## 2020-05-06 DIAGNOSIS — M79674 Pain in right toe(s): Secondary | ICD-10-CM

## 2020-05-06 DIAGNOSIS — E1151 Type 2 diabetes mellitus with diabetic peripheral angiopathy without gangrene: Secondary | ICD-10-CM

## 2020-05-06 DIAGNOSIS — M79675 Pain in left toe(s): Secondary | ICD-10-CM | POA: Diagnosis not present

## 2020-05-06 NOTE — Patient Instructions (Signed)
Diabetes Mellitus and Foot Care Foot care is an important part of your health, especially when you have diabetes. Diabetes may cause you to have problems because of poor blood flow (circulation) to your feet and legs, which can cause your skin to:  Become thinner and drier.  Break more easily.  Heal more slowly.  Peel and crack. You may also have nerve damage (neuropathy) in your legs and feet, causing decreased feeling in them. This means that you may not notice minor injuries to your feet that could lead to more serious problems. Noticing and addressing any potential problems early is the best way to prevent future foot problems. How to care for your feet Foot hygiene  Wash your feet daily with warm water and mild soap. Do not use hot water. Then, pat your feet and the areas between your toes until they are completely dry. Do not soak your feet as this can dry your skin.  Trim your toenails straight across. Do not dig under them or around the cuticle. File the edges of your nails with an emery board or nail file.  Apply a moisturizing lotion or petroleum jelly to the skin on your feet and to dry, brittle toenails. Use lotion that does not contain alcohol and is unscented. Do not apply lotion between your toes. Shoes and socks  Wear clean socks or stockings every day. Make sure they are not too tight. Do not wear knee-high stockings since they may decrease blood flow to your legs.  Wear shoes that fit properly and have enough cushioning. Always look in your shoes before you put them on to be sure there are no objects inside.  To break in new shoes, wear them for just a few hours a day. This prevents injuries on your feet. Wounds, scrapes, corns, and calluses  Check your feet daily for blisters, cuts, bruises, sores, and redness. If you cannot see the bottom of your feet, use a mirror or ask someone for help.  Do not cut corns or calluses or try to remove them with medicine.  If you  find a minor scrape, cut, or break in the skin on your feet, keep it and the skin around it clean and dry. You may clean these areas with mild soap and water. Do not clean the area with peroxide, alcohol, or iodine.  If you have a wound, scrape, corn, or callus on your foot, look at it several times a day to make sure it is healing and not infected. Check for: ? Redness, swelling, or pain. ? Fluid or blood. ? Warmth. ? Pus or a bad smell. General instructions  Do not cross your legs. This may decrease blood flow to your feet.  Do not use heating pads or hot water bottles on your feet. They may burn your skin. If you have lost feeling in your feet or legs, you may not know this is happening until it is too late.  Protect your feet from hot and cold by wearing shoes, such as at the beach or on hot pavement.  Schedule a complete foot exam at least once a year (annually) or more often if you have foot problems. If you have foot problems, report any cuts, sores, or bruises to your health care provider immediately. Contact a health care provider if:  You have a medical condition that increases your risk of infection and you have any cuts, sores, or bruises on your feet.  You have an injury that is not   healing.  You have redness on your legs or feet.  You feel burning or tingling in your legs or feet.  You have pain or cramps in your legs and feet.  Your legs or feet are numb.  Your feet always feel cold.  You have pain around a toenail. Get help right away if:  You have a wound, scrape, corn, or callus on your foot and: ? You have pain, swelling, or redness that gets worse. ? You have fluid or blood coming from the wound, scrape, corn, or callus. ? Your wound, scrape, corn, or callus feels warm to the touch. ? You have pus or a bad smell coming from the wound, scrape, corn, or callus. ? You have a fever. ? You have a red line going up your leg. Summary  Check your feet every day  for cuts, sores, red spots, swelling, and blisters.  Moisturize feet and legs daily.  Wear shoes that fit properly and have enough cushioning.  If you have foot problems, report any cuts, sores, or bruises to your health care provider immediately.  Schedule a complete foot exam at least once a year (annually) or more often if you have foot problems. This information is not intended to replace advice given to you by your health care provider. Make sure you discuss any questions you have with your health care provider. Document Revised: 09/05/2019 Document Reviewed: 01/14/2017 Elsevier Patient Education  2020 [employer]  Corns and Calluses Corns are small areas of thickened skin that occur on the top, sides, or tip of a toe. They contain a cone-shaped core with a point that can press on a nerve below. This causes pain.  Calluses are areas of thickened skin that can occur anywhere on the body, including the hands, fingers, palms, soles of the feet, and heels. Calluses are usually larger than corns. What are the causes? Corns and calluses are caused by rubbing (friction) or pressure, such as from shoes that are too tight or do not fit properly. What increases the risk? Corns are more likely to develop in people who have misshapen toes (toe deformities), such as hammer toes. Calluses can occur with friction to any area of the skin. They are more likely to develop in people who:  Work with their hands.  Wear shoes that fit poorly, are too tight, or are high-heeled.  Have toe deformities. What are the signs or symptoms? Symptoms of a corn or callus include:  A hard growth on the skin.  Pain or tenderness under the skin.  Redness and swelling.  Increased discomfort while wearing tight-fitting shoes, if your feet are affected. If a corn or callus becomes infected, symptoms may include:  Redness and swelling that gets worse.  Pain.  Fluid, blood, or pus draining from the corn or  callus. How is this diagnosed? Corns and calluses may be diagnosed based on your symptoms, your medical history, and a physical exam. How is this treated? Treatment for corns and calluses may include:  Removing the cause of the friction or pressure. This may involve: ? Changing your shoes. ? Wearing shoe inserts (orthotics) or other protective layers in your shoes, such as a corn pad. ? Wearing gloves.  Applying medicine to the skin (topical medicine) to help soften skin in the hardened, thickened areas.  Removing layers of dead skin with a file to reduce the size of the corn or callus.  Removing the corn or callus with a scalpel or laser.  Taking   antibiotic medicines, if your corn or callus is infected.  Having surgery, if a toe deformity is the cause. Follow these instructions at home:   Take over-the-counter and prescription medicines only as told by your health care provider.  If you were prescribed an antibiotic, take it as told by your health care provider. Do not stop taking it even if your condition starts to improve.  Wear shoes that fit well. Avoid wearing high-heeled shoes and shoes that are too tight or too loose.  Wear any padding, protective layers, gloves, or orthotics as told by your health care provider.  Soak your hands or feet and then use a file or pumice stone to soften your corn or callus. Do this as told by your health care provider.  Check your corn or callus every day for symptoms of infection. Contact a health care provider if you:  Notice that your symptoms do not improve with treatment.  Have redness or swelling that gets worse.  Notice that your corn or callus becomes painful.  Have fluid, blood, or pus coming from your corn or callus.  Have new symptoms. Summary  Corns are small areas of thickened skin that occur on the top, sides, or tip of a toe.  Calluses are areas of thickened skin that can occur anywhere on the body, including the  hands, fingers, palms, and soles of the feet. Calluses are usually larger than corns.  Corns and calluses are caused by rubbing (friction) or pressure, such as from shoes that are too tight or do not fit properly.  Treatment may include wearing any padding, protective layers, gloves, or orthotics as told by your health care provider. This information is not intended to replace advice given to you by your health care provider. Make sure you discuss any questions you have with your health care provider. Document Revised: 04/04/2019 Document Reviewed: 10/26/2017 Elsevier Patient Education  2020 [employer] Inc.  

## 2020-05-06 NOTE — Telephone Encounter (Signed)
Pt aware.

## 2020-05-13 NOTE — Progress Notes (Signed)
Subjective: Anthony [name] is a 74 y.o. male patient seen today at [facility]. Pt has h/o NIDDM with PAD and corn(s) right 3rd digit and painful mycotic toenails b/l that are difficult to trim. Pain interferes with ambulation. Aggravating factors include wearing enclosed shoe gear. Pain is relieved with periodic professional debridement.   He voices no new pedal problems on today's visit.  Patient Active Problem List   Diagnosis Date Noted  . Unilateral primary osteoarthritis, left knee 07/10/2019  . Bleeding external hemorrhoids suspected 06/07/2019  . Long term current use of clopidogrel 06/07/2019  . Unstable angina ([place])   . Bilateral primary osteoarthritis of knee 10/11/2017  . Chronic diastolic heart failure ([place]) 05/06/2015  . Femoral artery stenosis, right ([place]) 10/10/2014  . Preoperative clearance 09/09/2014  . Lumbar spine scoliosis 05/28/2014  . Pain in limb 12/10/2013  . Chest tightness 09/27/2013  . HTN (hypertension) 09/27/2013  . Peripheral vascular disease, unspecified ([place]) 01/08/2013  . PAD (peripheral artery disease) ([place]) 11/13/2012  . Atherosclerosis of native arteries of the extremities with intermittent claudication 11/13/2012  . Bilateral claudication of lower limb ([place]) 10/09/2012  . Coronary atherosclerosis of native coronary artery 08/26/2012  . COPD (chronic obstructive pulmonary disease) ([place]) 08/11/2012  . OSA (obstructive sleep apnea) 08/11/2012  . Diaphragm paralysis 08/11/2012  . Morbid obesity with BMI of 40.0-44.9, adult ([place]) 08/11/2012    Current Outpatient Medications on File Prior to Visit  Medication Sig Dispense Refill  . albuterol (PROAIR HFA) 108 (90 Base) MCG/ACT inhaler Inhale 2 puffs into the lungs every 6 (six) hours as needed for wheezing or shortness of breath. 3 Inhaler 3  . ASPIRIN LOW DOSE 81 MG EC tablet Take 81 mg by mouth daily.    . B Complex-Folic Acid (BENFOTIAMINE MULTI-B PO) Take 1 tablet by mouth 2 (two) times daily.      . bisoprolol (ZEBETA) 5 MG tablet TAKE 1 TABLET BY MOUTH  DAILY 90 tablet 3  . CINNAMON PO Take 2,000 mg by mouth every evening.     . diclofenac sodium (VOLTAREN) 1 % GEL Apply 1 application topically daily as needed (back pain).    Marland Kitchen doxazosin (CARDURA) 2 MG tablet Take 2 mg by mouth every evening.     Marland Kitchen doxycycline (ADOXA) 50 MG tablet Take 50 mg by mouth daily at 2 PM.     . empagliflozin (JARDIANCE) 10 MG TABS tablet Take 10 mg by mouth daily.    . fish oil-omega-3 fatty acids 1000 MG capsule Take 3 g by mouth daily.     . fluticasone (FLONASE) 50 MCG/ACT nasal spray Place 2 sprays into both nostrils as needed for allergies or rhinitis.    . furosemide (LASIX) 20 MG tablet Take 20 mg by mouth 2 (two) times daily.     Marland Kitchen GARLIC PO Take [id number] mg by mouth daily.     . isosorbide mononitrate (IMDUR) 30 MG 24 hr tablet TAKE 1 TABLET BY MOUTH  DAILY 90 tablet 3  . L-Lysine 1000 MG TABS Take 1,000 mg by mouth daily.     Marland Kitchen losartan (COZAAR) 100 MG tablet Take 100 mg by mouth daily.    . metFORMIN (GLUCOPHAGE) 1000 MG tablet Take 1,000 mg by mouth 2 (two) times daily with a meal.    . Multiple Vitamin (MULTIVITAMIN) tablet Take 1 tablet by mouth daily.    . nitroGLYCERIN (NITROSTAT) 0.4 MG SL tablet Place 1 tablet (0.4 mg total) under the tongue every 5 (five)  minutes as needed for chest pain. 90 tablet 3  . OZEMPIC, 1 MG/DOSE, 2 MG/1.5ML SOPN 0.5 mg weekly    . pentoxifylline (TRENTAL) 400 MG CR tablet TAKE 1 TABLET BY MOUTH  TWICE A DAY 180 tablet 3  . potassium chloride SA (K-DUR,KLOR-CON) 20 MEQ tablet Take 20 mEq by mouth 2 (two) times daily.    Marland Kitchen rOPINIRole (REQUIP) 1 MG tablet Take 1 mg by mouth every evening.     . simvastatin (ZOCOR) 40 MG tablet Take 40 mg by mouth every evening.    . SUPER B COMPLEX/C PO Take 1 tablet by mouth daily.    Marland Kitchen umeclidinium-vilanterol (ANORO ELLIPTA) 62.5-25 MCG/INH AEPB Inhale 1 puff into the lungs daily. 1 each 5   No current facility-administered  medications on file prior to visit.    No Known Allergies  Objective: Physical Exam  General: Well developed, nourished, no acute distress, awake, alert and oriented x 3  Neurovascular Examination: Neurovascular status unchanged b/l. DP pulses faintly palpable b/l. PT pulse palpable RLE; nonpalpable LLE. No pedal hair present. Skin temperature gradient WNL. Sensation diminished with 10 gram monofilament.  Dermatological:  Pedal skin is thin shiny, atrophic bilaterally. No open wounds bilaterally. No interdigital macerations bilaterally. Toenails L 3rd toe, L 4th toe, L 5th toe, R 3rd toe, R 4th toe and R 5th toe elongated, dystrophic, thickened, and crumbly with subungual debris and tenderness to dorsal palpation. Anonychia noted L hallux, L 2nd toe, R hallux and R 2nd toe. Nailbed(s) epithelialized.  Hyperkeratotic lesion(s) R 3rd toe.  No erythema, no edema, no drainage, no flocculence.  Musculoskeletal:  Normal muscle strength 5/5 to all lower extremity muscle groups bilaterally. No pain crepitus or joint limitation noted with ROM b/l. Hammertoes noted to the 2-5 bilaterally.  Assessment and Plan:  1. Pain due to onychomycosis of toenails of both feet   2. Corns   3. Type II diabetes mellitus with peripheral circulatory disorder (HCC)    -Examined patient. -No new findings. No new orders. -Continue diabetic foot care principles. Literature dispensed on today.  -Toenails L 3rd toe, L 4th toe, L 5th toe, R 3rd toe, R 4th toe and R 5th toe debrided in length and girth without iatrogenic bleeding with sterile nail nipper and dremel.  -Corn(s) R 3rd toe pared utilizing sterile scalpel blade without complication or incident. Total number debrided=1. -Patient to continue soft, supportive shoe gear daily. -Patient to report any pedal injuries to medical professional immediately. -Patient/POA to call should there be question/concern in the interim.   Return in about 3 months (around  08/06/2020) for diabetic nail and callus trim/Plavix.  Marzetta Board, DPM

## 2020-06-09 ENCOUNTER — Other Ambulatory Visit: Payer: Self-pay | Admitting: Pulmonary Disease

## 2020-06-11 ENCOUNTER — Telehealth: Payer: Self-pay

## 2020-06-11 NOTE — Telephone Encounter (Signed)
Telephone call from pt c/o BLE pain when walking to the mailbox and improves with rest. He has purplish-red discoloration to lower extremities and denies any swelling. Last OV was 06/19/18 and recommended to f/u in 1 year with ABIs and duplex. Advised pt will have scheduling contact him with appt. Pt voiced understanding.

## 2020-06-14 NOTE — Progress Notes (Signed)
06/14/2020 Anthony [name] [id number] [birth year]   Chief Complaint: Rectal Bleeding   History of Present Illness: Anthony [name] is a 74 year old male with a past medical history of arthritis, hypertension, coronary artery disease s/p stent 2001 and 04/2019 to prox LAD and POBA of D1 on ASA (Plavix was discontinued 01/3299), Diastolic CHF LV EF 76-22%, PAD s/p right external iliac common femoral superficial femoral endarterectomy with bovine patch angioplasty on 10/10/2014, DM II, COPD, right diaphragm  Elevation no longer requires oxygen at night, OSA uses cpap. He was last seen in our office by Dr. Ardis Hughs on 08/22/2019 for further evaluation for rectal bleeding. At that time, he was assessed to have external anal hemorrhoids and he was referred to [place] [facility] to consider external hemorrhoid therapy options which was not done. He previously saw Dr. Carlean Purl 06/07/2019 to consider hemorrhoid banding, however, the patient was assessed to have external anal hemorrhoids without evidence of any inflamed or prolapsing internal hemorrhoids. He presents today for further evaluation regarding intermittent rectal bleeding. He reports having on and off bright red rectal bleeding for a few days every 2 to 3 weeks for the past 1 1/2 years. No significant rectal pain, sometimes has minor anal discomfort. No purulent rectal drainage. He passed a normal brown BM this morning with red blood on the toilet tissue and in the toilet water. He often sees bright red blood soiled on his undergarment. He does not use enemas. He does utilize a [employer], applications. No history of anal intercourse. History of colon polyps. His most recent colonoscopy was 10/26/2016 which identified a 65mm tubular adenomatous polyp which was removed from the sigmoid colon and diverticulosis to the left colon. A repeat colonoscopy was recommended in 5 years. His father was diagnosed with colon cancer in his mid 48's. He has intentionally lost 50 - 60 lbs over  the past 3 years due to diet changes after he was diagnosed with diabetes.    Current Outpatient Medications on File Prior to Visit  Medication Sig Dispense Refill  . albuterol (PROAIR HFA) 108 (90 Base) MCG/ACT inhaler Inhale 2 puffs into the lungs every 6 (six) hours as needed for wheezing or shortness of breath. 3 Inhaler 3  . ANORO ELLIPTA 62.5-25 MCG/INH AEPB USE 1 INHALATION BY MOUTH  DAILY 180 each 0  . ASPIRIN LOW DOSE 81 MG EC tablet Take 81 mg by mouth daily.    . B Complex-Folic Acid (BENFOTIAMINE MULTI-B PO) Take 1 tablet by mouth 2 (two) times daily.     . bisoprolol (ZEBETA) 5 MG tablet TAKE 1 TABLET BY MOUTH  DAILY 90 tablet 3  . CINNAMON PO Take 2,000 mg by mouth every evening.     . diclofenac sodium (VOLTAREN) 1 % GEL Apply 1 application topically daily as needed (back pain).    Marland Kitchen doxazosin (CARDURA) 2 MG tablet Take 2 mg by mouth every evening.     Marland Kitchen doxycycline (ADOXA) 50 MG tablet Take 50 mg by mouth daily at 2 PM.     . doxycycline (VIBRAMYCIN) 50 MG capsule Take 1 capsule by mouth daily.    . empagliflozin (JARDIANCE) 10 MG TABS tablet Take 10 mg by mouth daily.    . fish oil-omega-3 fatty acids 1000 MG capsule Take 3 g by mouth daily.     . fluticasone (FLONASE) 50 MCG/ACT nasal spray Place 2 sprays into both nostrils as needed for allergies or rhinitis.    . furosemide (LASIX) 20  MG tablet Take 20 mg by mouth 2 (two) times daily.     Marland Kitchen GARLIC PO Take 2,542 mg by mouth daily.     . isosorbide mononitrate (IMDUR) 30 MG 24 hr tablet TAKE 1 TABLET BY MOUTH  DAILY 90 tablet 3  . L-Lysine 1000 MG TABS Take 1,000 mg by mouth daily.     Marland Kitchen losartan (COZAAR) 100 MG tablet Take 100 mg by mouth daily.    . metFORMIN (GLUCOPHAGE) 1000 MG tablet Take 1,000 mg by mouth 2 (two) times daily with a meal.    . Multiple Vitamin (MULTIVITAMIN) tablet Take 1 tablet by mouth daily.    Marland Kitchen OZEMPIC, 1 MG/DOSE, 2 MG/1.5ML SOPN 0.5 mg weekly    . pentoxifylline (TRENTAL) 400 MG CR tablet TAKE 1  TABLET BY MOUTH  TWICE A DAY 180 tablet 3  . potassium chloride SA (K-DUR,KLOR-CON) 20 MEQ tablet Take 20 mEq by mouth 2 (two) times daily.    Marland Kitchen rOPINIRole (REQUIP) 1 MG tablet Take 1 mg by mouth every evening.     . simvastatin (ZOCOR) 40 MG tablet Take 40 mg by mouth every evening.    . SUPER B COMPLEX/C PO Take 1 tablet by mouth daily.    . nitroGLYCERIN (NITROSTAT) 0.4 MG SL tablet Place 1 tablet (0.4 mg total) under the tongue every 5 (five) minutes as needed for chest pain. 90 tablet 3   No current facility-administered medications on file prior to visit.    No Known Allergies  Current Medications, Allergies, Past Medical History, Past Surgical History, Family History and Social History were reviewed in [employer] record.   Physical Exam: BP 108/60 (BP Location: Left Arm, Patient Position: Sitting, Cuff Size: Normal)   Pulse 68   Ht 5' 7.75" (1.721 m) Comment: height measured without shoes  Wt 222 lb 6 oz (100.9 kg)   BMI 34.06 kg/m  General: Well developed 74 year old male in no acute distress. Head: Normocephalic and atraumatic. Eyes: No scleral icterus. Conjunctiva pink . Ears: Normal auditory acuity. Mouth: Dentition intact. Permanent bridge intact. No ulcers or lesions.  Lungs: Clear throughout to auscultation. Heart: Regular rate and rhythm, no murmur. Abdomen: Soft, nontender and nondistended. No masses or hepatomegaly. Normal bowel sounds x 4 quadrants.  Rectal: No external hemorrhoids or fissures. Anal hemorrhoids without evidence of active bleeding. No stool in the rectum. Prostate enlarged when assessed in the left lateral position. Red blood actively oozing from a small circular fistula appearing opening 2 cm left and anterior from the anal orifice. The underlying anal derm appears somewhat darkened and thick. Sophia CMA present during the exam. Dr. Ardis Hughs consulted and he assessed the same findings. Pressure was applied to the bleeding area  with a gauze with significant reduction in bleeding. The patient was wearing an sanitary pad which was moderately saturated with red blood.  Musculoskeletal: Symmetrical with no gross deformities. Extremities: No edema. Neurological: Alert oriented x 4. No focal deficits.  Psychological: Alert and cooperative. Normal mood and affect  Assessment and Recommendations:  35. 74 year old male with chronic intermittent anorectal bleeding, rectal exam today identified active bleeding from a questionable fistula opening 2 cm from the left/anterior anal orifice  -Pelvic MRI with contrast to assess for anorectal fistula  -Refer to [place] [facility]  -CBC, CMP and CRP  -Patient to call our office if his rectal bleeding worsens   2. History of CAD/stent in 2001 and 04/2019 on ASA. Plavix was discontinued  04/2020 -Patient to continue ASA   3. PAD s/p right femoral endarterectomy 2015  4. History of tubular adenomatous polyps. Father with history of colon cancer. Next colonoscopy due 09/2020 -To further verify his colonoscopy due date after the above evaluation completed   5. DM II  6. History of COPD, elevated right diaphragm, sleep apnea

## 2020-06-17 ENCOUNTER — Ambulatory Visit (INDEPENDENT_AMBULATORY_CARE_PROVIDER_SITE_OTHER): Payer: Medicare Other | Admitting: Nurse Practitioner

## 2020-06-17 ENCOUNTER — Encounter: Payer: Self-pay | Admitting: Nurse Practitioner

## 2020-06-17 ENCOUNTER — Other Ambulatory Visit (INDEPENDENT_AMBULATORY_CARE_PROVIDER_SITE_OTHER): Payer: Medicare Other

## 2020-06-17 ENCOUNTER — Telehealth: Payer: Self-pay | Admitting: General Surgery

## 2020-06-17 VITALS — BP 108/60 | HR 68 | Ht 67.75 in | Wt 222.4 lb

## 2020-06-17 DIAGNOSIS — K625 Hemorrhage of anus and rectum: Secondary | ICD-10-CM | POA: Diagnosis not present

## 2020-06-17 DIAGNOSIS — Z86010 Personal history of colonic polyps: Secondary | ICD-10-CM

## 2020-06-17 DIAGNOSIS — K604 Rectal fistula: Secondary | ICD-10-CM

## 2020-06-17 LAB — CBC WITH DIFFERENTIAL/PLATELET
Basophils Absolute: 0.1 10*3/uL (ref 0.0–0.1)
Basophils Relative: 0.8 % (ref 0.0–3.0)
Eosinophils Absolute: 0.1 10*3/uL (ref 0.0–0.7)
Eosinophils Relative: 2.3 % (ref 0.0–5.0)
HCT: 46.1 % (ref 39.0–52.0)
Hemoglobin: 15.3 g/dL (ref 13.0–17.0)
Lymphocytes Relative: 20.2 % (ref 12.0–46.0)
Lymphs Abs: 1.3 10*3/uL (ref 0.7–4.0)
MCHC: 33.3 g/dL (ref 30.0–36.0)
MCV: 93.3 fl (ref 78.0–100.0)
Monocytes Absolute: 0.6 10*3/uL (ref 0.1–1.0)
Monocytes Relative: 9.4 % (ref 3.0–12.0)
Neutro Abs: 4.3 10*3/uL (ref 1.4–7.7)
Neutrophils Relative %: 67.3 % (ref 43.0–77.0)
Platelets: 135 10*3/uL — ABNORMAL LOW (ref 150.0–400.0)
RBC: 4.94 Mil/uL (ref 4.22–5.81)
RDW: 13.6 % (ref 11.5–15.5)
WBC: 6.4 10*3/uL (ref 4.0–10.5)

## 2020-06-17 LAB — COMPREHENSIVE METABOLIC PANEL
ALT: 15 U/L (ref 0–53)
AST: 16 U/L (ref 0–37)
Albumin: 4.4 g/dL (ref 3.5–5.2)
Alkaline Phosphatase: 72 U/L (ref 39–117)
BUN: 30 mg/dL — ABNORMAL HIGH (ref 6–23)
CO2: 28 mEq/L (ref 19–32)
Calcium: 9.8 mg/dL (ref 8.4–10.5)
Chloride: 103 mEq/L (ref 96–112)
Creatinine, Ser: 1.26 mg/dL (ref 0.40–1.50)
GFR: 55.68 mL/min — ABNORMAL LOW (ref >60.00)
Glucose, Bld: 138 mg/dL — ABNORMAL HIGH (ref 70–99)
Potassium: 5 mEq/L (ref 3.5–5.1)
Sodium: 137 mEq/L (ref 135–145)
Total Bilirubin: 0.8 mg/dL (ref 0.2–1.2)
Total Protein: 7.3 g/dL (ref 6.0–8.3)

## 2020-06-17 LAB — C-REACTIVE PROTEIN: CRP: <1 mg/dL (ref 0.5–20.0)

## 2020-06-17 NOTE — Progress Notes (Signed)
I examined the patient in the room.   This is not hemorrhoidal bleeding.  This not bleeding from a fissure.  The bleeding is not coming from his anus.  Rather, he has a pinpoint opening of the skin about 2 cm outside of his anus that is intermittently oozing blood.  I am not sure if this is connected to his rectum or anus however it is clearly the site of his bleeding.  We will get a pelvic MRI to see if this communicates with his GI tract and also we will refer him to [facility] surgery.

## 2020-06-17 NOTE — Telephone Encounter (Signed)
Sent office note for today to [facility] for surgeon to evaluate patient for a possible fistula.

## 2020-06-17 NOTE — Patient Instructions (Addendum)
If you are age 76 or older, your body mass index should be between 23-30. Your Body mass index is 34.06 kg/m. If this is out of the aforementioned range listed, please consider follow up with your Primary Care Provider.  If you are age 57 or younger, your body mass index should be between 19-25. Your Body mass index is 34.06 kg/m. If this is out of the aformentioned range listed, please consider follow up with your Primary Care Provider.   Your provider has requested that you go to the basement level for lab work before leaving today. Press "B" on the elevator. The lab is located at the first door on the left as you exit the elevator.  You have been scheduled for an MRI at [facility] on 07/04/2010@11 :00 * Please arrive at 10:30 am for your appointment time for registration purposes.  In addition, if you have any metal in your body, have a pacemaker or defibrillator, please be sure to let your ordering physician know. This test typically takes 45 minutes to 1 hour to complete. Should you need to reschedule, please call [phone]  Due to recent changes in healthcare laws, you may see the results of your imaging and laboratory studies on MyChart before your provider has had a chance to review them.  We understand that in some cases there may be results that are confusing or concerning to you. Not all laboratory results come back in the same time frame and the provider may be waiting for multiple results in order to interpret others.  Please give [place] 48 hours in order for your provider to thoroughly review all the results before contacting the office for clarification of your results.

## 2020-07-04 ENCOUNTER — Ambulatory Visit (HOSPITAL_COMMUNITY)
Admission: RE | Admit: 2020-07-04 | Discharge: 2020-07-04 | Disposition: A | Discharge status: Home / Self Care | Payer: Medicare Other | Source: Ambulatory Visit | Attending: Nurse Practitioner | Admitting: Nurse Practitioner

## 2020-07-04 DIAGNOSIS — K604 Rectal fistula: Secondary | ICD-10-CM | POA: Diagnosis present

## 2020-07-04 DIAGNOSIS — K625 Hemorrhage of anus and rectum: Secondary | ICD-10-CM | POA: Diagnosis present

## 2020-07-04 DIAGNOSIS — Z86010 Personal history of colonic polyps: Secondary | ICD-10-CM | POA: Diagnosis present

## 2020-07-04 MED ORDER — GADOBUTROL 1 MMOL/ML IV SOLN
10.0000 mL | Freq: Once | INTRAVENOUS | Status: AC | PRN
  Administered 2020-07-04: 10 mL via INTRAVENOUS

## 2020-07-14 ENCOUNTER — Ambulatory Visit (INDEPENDENT_AMBULATORY_CARE_PROVIDER_SITE_OTHER): Payer: Medicare Other | Admitting: Pulmonary Disease

## 2020-07-14 ENCOUNTER — Other Ambulatory Visit: Payer: Self-pay

## 2020-07-14 ENCOUNTER — Encounter: Payer: Self-pay | Admitting: Pulmonary Disease

## 2020-07-14 VITALS — BP 120/62 | HR 64 | Temp 98.2°F | Ht 67.5 in | Wt 223.0 lb

## 2020-07-14 DIAGNOSIS — J986 Disorders of diaphragm: Secondary | ICD-10-CM

## 2020-07-14 DIAGNOSIS — G4733 Obstructive sleep apnea (adult) (pediatric): Secondary | ICD-10-CM

## 2020-07-14 DIAGNOSIS — J449 Chronic obstructive pulmonary disease, unspecified: Secondary | ICD-10-CM

## 2020-07-14 NOTE — Patient Instructions (Signed)
Follow up in 1 year.

## 2020-07-14 NOTE — Progress Notes (Signed)
[facility]  Chief Complaint  Patient presents with  . Follow-up    Sob-same. Working good with [employer].    Constitutional:  BP 120/62 (BP Location: Right Arm, Cuff Size: Normal)   Pulse 64   Temp 98.2 F (36.8 C) (Oral)   Ht 5' 7.5" (1.715 m)   Wt 223 lb (101.2 kg)   SpO2 97%   BMI 34.41 kg/m   Past Medical History:  CAD, HLD, combined CHF, PAD, HTN, DM, Lumbar stenosis  Brief Summary:  Anthony [name] is a 74 y.o. male with COPD, OSA, and rt diaphragm elevation.  Subjective:  Breathing better since switching to anoro.  Not needing albuterol much.  Not having cough, wheeze, sputum, or chest congestion.  Keeps up with routine activities w/o trouble.  Uses CPAP nightly.  No issues with mask fit.  Not having sinus congestion, sore throat or dry mouth.  Hasn't need supplemental oxygen for years.  He is due to have colonoscopy with Dr. Ardis Hughs.   Physical Exam:   Appearance - well kempt   ENMT - no sinus tenderness, no oral exudate, no LAN, Mallampati 3 airway, no stridor  Respiratory - equal breath sounds bilaterally, no wheezing or rales  CV - s1s2 regular rate and rhythm, no murmurs  Ext - no clubbing, no edema  Skin - no rashes  Psych - normal mood and affect   Assessment/Plan:   COPD with chronic bronchitis. - improved since switching from spiriva to anoro - continue anoro with prn albuterol  Obstructive sleep apnea. - he is compliant with CPAP - continue CPAP 13 cm H2O  CPAP rhinitis. - prn flonase  Rt hemidiaphragm elevation. - stable findings on most recent CXR and no significant restriction on PFT   CAD, chronic diastolic CHF. - followed by Dr. [name] with cardiology  Peripheral artery disease. - followed by Dr. [name] with vascular surgery  Anal fissure, anal bleeding. - he can proceed with colonoscopy and will f/u with Dr. Ardis Hughs with [facility]  A total of  22 minutes spent addressing  patient care issues on day of visit.  Follow up:   Patient Instructions  Follow up in 1 year   Signature:  [name], MD [place] Pager: [phone] 07/14/2020, 11:58 AM  Flow Sheet    Pulmonary tests:   PFT 08/13/12>>FEV1 1.83 (62%), FEV1% 64, TLC 5.17 (82%), DLCO 67%, +BD  PFT 11/28/19 >> FEV1 2.30 (78%), FEV1% 75, TLC 5.84 (85%), DLCO 70%, no BD  Chest imaging:   SNIFF test 08/14/12>>Paradoxical motion of the right hemidiaphragm with inspiration  Sleep tests:   PSG 08/14/12>>AHI 38.5. CPAP 13 cm H2O with 1 liter oxygen  ONO with CPAP and RA 01/18/13 >> test time 10 hrs 37 min. Mean SpO2 94%, low SpO2 86%. Spent 16 sec with SpO2 < 88%  CPAP 06/14/20 to 07/13/20 >> used on 30 of 30 nights with average 9 hrs 20 min.  Average AHI 0.9 with CPAP 13 cm H2O.  Cardiac tests:   RHC/LHC 05/11/19 >> RA 4, RV 27/5, PA 28/4(15), PCW 13, CI 2.2, PVR < 1 WU; 2v CAD, chronic proximal RCA total occlusion, angioplasty to diagonal branch, DES to proximal LAD  Echo 08/15/19 >> EF 55 to 60%, mild LVH  Medications:   Allergies as of 07/14/2020   No Known Allergies     Medication List       Accurate as of July 14, 2020 11:58 AM. If you  have any questions, ask your nurse or doctor.        albuterol 108 (90 Base) MCG/ACT inhaler Commonly known as: ProAir HFA Inhale 2 puffs into the lungs every 6 (six) hours as needed for wheezing or shortness of breath.   Anoro Ellipta 62.5-25 MCG/INH Aepb Generic drug: umeclidinium-vilanterol USE 1 INHALATION BY MOUTH  DAILY   Aspirin Low Dose 81 MG EC tablet Generic drug: aspirin Take 81 mg by mouth daily.   BENFOTIAMINE MULTI-B PO Take 1 tablet by mouth 2 (two) times daily.   bisoprolol 5 MG tablet Commonly known as: ZEBETA TAKE 1 TABLET BY MOUTH  DAILY   CINNAMON PO Take 2,000 mg by mouth every evening.   diclofenac sodium 1 % Gel Commonly known as: VOLTAREN Apply 1 application topically daily as needed  (back pain).   doxazosin 2 MG tablet Commonly known as: CARDURA Take 2 mg by mouth every evening.   doxycycline 50 MG capsule Commonly known as: VIBRAMYCIN Take 1 capsule by mouth daily.   doxycycline 50 MG tablet Commonly known as: ADOXA Take 50 mg by mouth daily at 2 PM.   fish oil-omega-3 fatty acids 1000 MG capsule Take 3 g by mouth daily.   fluticasone 50 MCG/ACT nasal spray Commonly known as: FLONASE Place 2 sprays into both nostrils as needed for allergies or rhinitis.   furosemide 20 MG tablet Commonly known as: LASIX Take 20 mg by mouth 2 (two) times daily.   GARLIC PO Take 7,867 mg by mouth daily.   isosorbide mononitrate 30 MG 24 hr tablet Commonly known as: IMDUR TAKE 1 TABLET BY MOUTH  DAILY   Jardiance 10 MG Tabs tablet Generic drug: empagliflozin Take 10 mg by mouth daily.   L-Lysine 1000 MG Tabs Take 1,000 mg by mouth daily.   losartan 100 MG tablet Commonly known as: COZAAR Take 100 mg by mouth daily.   metFORMIN 1000 MG tablet Commonly known as: GLUCOPHAGE Take 1,000 mg by mouth 2 (two) times daily with a meal.   multivitamin tablet Take 1 tablet by mouth daily.   nitroGLYCERIN 0.4 MG SL tablet Commonly known as: NITROSTAT Place 1 tablet (0.4 mg total) under the tongue every 5 (five) minutes as needed for chest pain.   Ozempic (1 MG/DOSE) 2 MG/1.5ML Sopn Generic drug: Semaglutide (1 MG/DOSE) 0.5 mg weekly   pentoxifylline 400 MG CR tablet Commonly known as: TRENTAL TAKE 1 TABLET BY MOUTH  TWICE A DAY   potassium chloride SA 20 MEQ tablet Commonly known as: KLOR-CON Take 20 mEq by mouth 2 (two) times daily.   rOPINIRole 1 MG tablet Commonly known as: REQUIP Take 1 mg by mouth every evening.   simvastatin 40 MG tablet Commonly known as: ZOCOR Take 40 mg by mouth every evening.   SUPER B COMPLEX/C PO Take 1 tablet by mouth daily.       Past Surgical History:  He  has a past surgical history that includes Tonsillectomy  (1965); Bunionectomy ([place]); Neuroma surgery (1996); Lumbar disc surgery (Dec 1997 & Ampril 2005); Cervical fusion (Nov 2002); Hammer toe surgery (June 2007 & August 2008); Carpal tunnel release (09/30/2006); Hand Arthroplasty (Oct 2010); Heart catherization (03/26/1999); Angioplasty (06/02/2000); Spine surgery (04/21/2004); Abdominal angiogram (Dec. 4, 2013); Cardiac catheterization; Anterior lateral lumbar fusion 4 levels (Right, 05/28/2014); Lumbar percutaneous pedicle screw 4 level (N/A, 05/28/2014); Eye surgery (Bilateral, 2014); Colonoscopy w/ biopsies and polypectomy; Endarterectomy femoral (Right, 10/10/2014); abdominal aortagram (N/A, 11/29/2012); abdominal aortagram (N/A, 09/03/2014); Cardiac catheterization (  N/A, 12/07/2016); RIGHT/LEFT HEART CATH AND CORONARY ANGIOGRAPHY (N/A, 05/11/2019); and CORONARY STENT INTERVENTION (N/A, 05/11/2019).  Family History:  His family history includes Cancer in his father and mother; Deep vein thrombosis in his mother; Diabetes in his mother; Heart attack in his father; Heart disease (age of onset: 29) in his father; Hyperlipidemia in his father and mother; Hypertension in his father and mother.  Social History:  He  reports that he quit smoking about 32 years ago. His smoking use included cigarettes. He has a 56.00 pack-year smoking history. He has never used smokeless tobacco. He reports current alcohol use. He reports that he does not use drugs.

## 2020-07-15 ENCOUNTER — Ambulatory Visit: Payer: Self-pay | Admitting: Surgery

## 2020-07-15 ENCOUNTER — Encounter: Payer: Self-pay | Admitting: Gastroenterology

## 2020-07-15 ENCOUNTER — Telehealth: Payer: Self-pay | Admitting: Gastroenterology

## 2020-07-15 NOTE — H&P (Signed)
Era Skeen Appointment: 07/15/2020 8:45 AM Location: [facility] Patient #: [id number] DOB: Sep 06, 1946 Married / Language: Cleophus Molt / Race: White Male   History of Present Illness Adin Hector MD; 07/15/2020 10:01 AM) The patient is a 74 year old male who presents with a complaint of Rectal bleeding. Note for "Rectal bleeding": ` ` ` Patient sent for surgical consultation at the request of Oretha Caprice, MD, [facility]  Chief Complaint: Rectal bleeding. ` ` The patient is a gentleman with known hemorrhoids he's had some intermittent rectal bleeding. Had a colonoscopy in 2017 that showed some sigmoid diverticulosis and tubular adenoma but no major bleeding or concern. Had some anorectal complaints and was seen last year. External hemorrhoids noted. It did not seem to be severe. Fiber bowel regimen recommended as a start. Apparently patient still had some bleeding complaints. Patient's wife noted she thought she saw cut at the anus last year. Was reexamined by gastroenterology. There was concern of a possible fissure but exam Dr. Ardis Hughs did not see a fissure. He notes a separate opening sinus suspicious for a fistula. MRI was done earlier this month that shows no evidence of any fistula or other concerning abnormalities. Surgical consultation requested to help sort things out.  Patient has a history of coronary artery disease. He had severe LAD stenosis requiring angioplasty and stenting May 2020. He was placed on Plavix. An echocardiogram in August 2020 that showed normal systolic function. Patient has been off Plavix for 2 months. Follow Dr. Britta Mccreedy on with cardiology. Just on low-dose aspirin. Usually moves his bowels once a day. Takes fiber Demis. Occasionally loose. He notes he will get intermittent bright red blood with occasional small clots every 3 weeks or so for a couple of days. No severe pain with this but uncomfortable. Tripping. His wife is here  today. She claims she thought she saw something within like a paper cut last year. He denies any severe pain with bowel movements. No itching. He cannot walk very far due to chronic back pain issues but otherwise has decent heart function.  No personal nor family history of GI/colon cancer, inflammatory bowel disease, irritable bowel syndrome, allergy such as Celiac Sprue, dietary/dairy problems, colitis, ulcers nor gastritis. No recent sick contacts/gastroenteritis. No travel outside the country. No changes in diet. No dysphagia to solids or liquids. No significant heartburn or reflux. No melena, hematemesis, coffee ground emesis. No evidence of prior gastric/peptic ulceration.  (Review of systems as stated in this history (HPI) or in the review of systems. Otherwise all other 12 point ROS are negative) ` ` `  This patient encounter took 35 minutes today to perform the following: obtain history, perform exam, review outside records, interpret tests & imaging, counsel the patient on their diagnosis; and, document this encounter, including findings & plan in the electronic health record (EHR).   Past Surgical History (Tanisha A. Owens Shark, [place]; 07/15/2020 8:29 AM) Cataract Surgery  Bilateral. Colon Polyp Removal - Colonoscopy  Colon Polyp Removal - Open  Foot Surgery  Bilateral. Spinal Surgery - Lower Back  Spinal Surgery - Neck  Spinal Surgery Midback   Diagnostic Studies History (Tanisha A. Owens Shark, [place]; 07/15/2020 8:29 AM) Colonoscopy  1-5 years ago  Allergies (Tanisha A. Owens Shark, [place]; 07/15/2020 8:29 AM) No Known Drug Allergies  [10/05/2019]: Allergies Reconciled   Medication History (Tanisha A. Owens Shark, RMA; 07/15/2020 8:31 AM) Bisoprolol Fumarate (5MG  Tablet, Oral) Active. Doxazosin Mesylate (2MG  Tablet, Oral) Active. Doxycycline Hyclate (50MG  Capsule, Oral) Active.  Furosemide (20MG  Tablet, Oral) Active. Isosorbide Mononitrate ER (30MG  Tablet ER 24HR, Oral)  Active. Jardiance (10MG  Tablet, Oral) Active. Losartan Potassium (100MG  Tablet, Oral) Active. metFORMIN HCl (1000MG  Tablet, Oral) Active. Pentoxifylline ER (400MG  Tablet ER, Oral) Active. rOPINIRole HCl (1MG  Tablet, Oral) Active. Simvastatin (40MG  Tablet, Oral) Active. Spiriva HandiHaler (18MCG Capsule, Inhalation) Active. Aspirin (81MG  Tablet, Oral) Active. Cinammon Active. Fish Oil (1000MG  Capsule, Oral) Active. Garlic (1000MG  Capsule, Oral) Active. Lysine (500MG  Tablet, Oral two times daily) Active. Mega-B (250 mg Oral) Active. One Daily Multiple Vitamin (Oral) Active. B Complex + C TR (Oral) Active. Ozempic (1 MG/DOSE) (4MG /3ML Soln Pen-inj, Subcutaneous) Active. Anoro Ellipta (62.5-25MCG/INH Aero Pow Br Act, Inhalation) Active. Medications Reconciled  Social History (Tanisha A. Owens Shark, [place]; 07/15/2020 8:29 AM) Alcohol use  Occasional alcohol use. Caffeine use  Coffee. No drug use  Tobacco use  Former smoker.  Family History (Tanisha A. Owens Shark, [place]; 07/15/2020 8:29 AM) Arthritis  Brother, Mother. Colon Cancer  Father. Colon Polyps  Father. Diabetes Mellitus  Brother, Mother. Heart Disease  Brother. Heart disease in male family member before age 3  Ischemic Bowel Disease  Mother. Ovarian Cancer  Mother.  Other Problems (Tanisha A. Owens Shark, [place]; 07/15/2020 8:29 AM) Arthritis  Back Pain  Chronic Obstructive Lung Disease  Congestive Heart Failure  Diabetes Mellitus  High blood pressure  Sleep Apnea  Vascular Disease     Review of Systems (Tanisha A. Brown RMA; 07/15/2020 9:37 AM) General Present- Weight Loss. Not Present- Appetite Loss, Chills, Fatigue, Fever, Night Sweats and Weight Gain. HEENT Present- Ringing in the Ears and Wears glasses/contact lenses. Not Present- Earache, Hearing Loss, Hoarseness, Nose Bleed, Oral Ulcers, Seasonal Allergies, Sinus Pain, Sore Throat, Visual Disturbances and Yellow Eyes. Gastrointestinal Present-  Hemorrhoids. Not Present- Abdominal Pain, Bloating, Bloody Stool, Change in Bowel Habits, Chronic diarrhea, Constipation, Difficulty Swallowing, Excessive gas, Gets full quickly at meals, Indigestion, Nausea, Rectal Pain and Vomiting. Male Genitourinary Present- Urine Leakage. Not Present- Blood in Urine, Change in Urinary Stream, Frequency, Impotence, Nocturia, Painful Urination and Urgency.  Vitals (Tanisha A. Brown RMA; 07/15/2020 9:40 AM) 07/15/2020 9:39 AM Weight: 222.4 lb Height: 67.5in Body Surface Area: 2.13 m Body Mass Index: 34.32 kg/m  Temp.: 98.26F  Pulse: 79 (Regular)  BP: 124/84(Sitting, Left Arm, Standard)       Physical Exam Adin Hector MD; 07/15/2020 8:53 AM) General Mental Status-Alert. General Appearance-Not in acute distress, Not Sickly. Orientation-Oriented X3. Hydration-Well hydrated. Voice-Normal.  Integumentary Global Assessment Upon inspection and palpation of skin surfaces of the - Axillae: non-tender, no inflammation or ulceration, no drainage. and Distribution of scalp and body hair is normal. General Characteristics Temperature - normal warmth is noted.  Head and Neck Head-normocephalic, atraumatic with no lesions or palpable masses. Face Global Assessment - atraumatic, no absence of expression. Neck Global Assessment - no abnormal movements, no bruit auscultated on the right, no bruit auscultated on the left, no decreased range of motion, non-tender. Trachea-midline. Thyroid Gland Characteristics - non-tender.  Eye Eyeball - Left-Extraocular movements intact, No Nystagmus - Left. Eyeball - Right-Extraocular movements intact, No Nystagmus - Right. Cornea - Left-No Hazy - Left. Cornea - Right-No Hazy - Right. Sclera/Conjunctiva - Left-No scleral icterus, No Discharge - Left. Sclera/Conjunctiva - Right-No scleral icterus, No Discharge - Right. Pupil - Left-Direct reaction to light normal. Pupil -  Right-Direct reaction to light normal.  ENMT Ears Pinna - Left - no drainage observed, no generalized tenderness observed. Pinna - Right - no drainage observed, no generalized tenderness observed.  Nose and Sinuses External Inspection of the Nose - no destructive lesion observed. Inspection of the nares - Left - quiet respiration. Inspection of the nares - Right - quiet respiration. Mouth and Throat Lips - Upper Lip - no fissures observed, no pallor noted. Lower Lip - no fissures observed, no pallor noted. Nasopharynx - no discharge present. Oral Cavity/Oropharynx - Tongue - no dryness observed. Oral Mucosa - no cyanosis observed. Hypopharynx - no evidence of airway distress observed.  Chest and Lung Exam Inspection Movements - Normal and Symmetrical. Accessory muscles - No use of accessory muscles in breathing. Palpation Palpation of the chest reveals - Non-tender. Auscultation Breath sounds - Normal and Clear.  Cardiovascular Auscultation Rhythm - Regular. Murmurs & Other Heart Sounds - Auscultation of the heart reveals - No Murmurs and No Systolic Clicks.  Abdomen Inspection Inspection of the abdomen reveals - No Visible peristalsis and No Abnormal pulsations. Umbilicus - No Bleeding, No Urine drainage. Palpation/Percussion Palpation and Percussion of the abdomen reveal - Soft, Non Tender, No Rebound tenderness, No Rigidity (guarding) and No Cutaneous hyperesthesia. Note: Abdomen obese but soft. Nontender. Mild super umbilical diastases recti. Not distended. No umbilical or incisional hernias. No guarding.   Male Genitourinary Sexual Maturity Tanner 5 - Adult hair pattern and Adult penile size and shape. Note: No inguinal hernias. Normal external genitalia. Epididymi, testes, and spermatic cords normal without any masses.   Rectal Note: Please refer to anoscopy.  Mildly irritated internal hemorrhoids. Some purplish dotting suspicious for congestion or small  thrombosed hemorrhoids in the right posterior aspect. Posterior midline 3 mm tag but no active fissure. No other external hemorrhoids.  Sphincter tone is not increased. Slightly decreased. Tolerates digital and anoscopic exam. At least grade 2 internal hemorrhoids right posterior greater than left lateral greater than right anterior. Might be slightly prolapsing with straining to make it grade 3.  Prostate not enlarged and smooth. .No condyloma warts. No pilonidal disease. There are a few small areas of folliculitis but no sinus opening suspicious for fistula.   Peripheral Vascular Upper Extremity Inspection - Left - No Cyanotic nailbeds - Left, Not Ischemic. Inspection - Right - No Cyanotic nailbeds - Right, Not Ischemic.  Neurologic Neurologic evaluation reveals -normal attention span and ability to concentrate, able to name objects and repeat phrases. Appropriate fund of knowledge , normal sensation and normal coordination. Mental Status Affect - not angry, not paranoid. Cranial Nerves-Normal Bilaterally. Gait-Normal.  Neuropsychiatric Mental status exam performed with findings of-able to articulate well with normal speech/language, rate, volume and coherence, thought content normal with ability to perform basic computations and apply abstract reasoning and no evidence of hallucinations, delusions, obsessions or homicidal/suicidal ideation.  Musculoskeletal Global Assessment Spine, Ribs and Pelvis - no instability, subluxation or laxity. Right Upper Extremity - no instability, subluxation or laxity.  Lymphatic Head & Neck  General Head & Neck Lymphatics: Bilateral - Description - No Localized lymphadenopathy. Axillary  General Axillary Region: Bilateral - Description - No Localized lymphadenopathy. Femoral & Inguinal  Generalized Femoral & Inguinal Lymphatics: Left - Description - No Localized lymphadenopathy. Right - Description - No Localized  lymphadenopathy.   Results Adin Hector MD; 07/15/2020 10:02 AM) Procedures  Name Value Date Hemorrhoids Procedure Anal exam: Skin tag Internal exam: prolapse Internal Hemorroids ( non-bleeding) Other: Please refer to anoscopy.............Mildly irritated internal hemorrhoids. Some purplish dotting suspicious for congestion or small thrombosed hemorrhoids in the right posterior aspect. Posterior midline 3 mm tag but no active fissure. No other external  hemorrhoids. ...........Marland KitchenSphincter tone is not increased. Slightly decreased. Tolerates digital and anoscopic exam. At least grade 2 internal hemorrhoids right posterior greater than left lateral greater than right anterior. Might be slightly prolapsing with straining to make it grade 3............Marland KitchenProstate not enlarged and smooth. No condyloma warts. No pilonidal disease. There are a few small areas of folliculitis but no sinus opening suspicious for fistula.  Performed: 07/15/2020 8:54 AM    Assessment & Plan Adin Hector MD; 07/15/2020 10:00 AM) PAINLESS RECTAL BLEEDING (K62.5) Impression: PAinlessrectal bleeding of uncertain etiology. Seems bright red & low volume which points towards an anorectal etiology. I see no evidence of any any active fissure nor does he really give a history of that. There is a tiny anal verge tag posterior midline and wife notes she thought she saw something last year like a anal cut. Regardless, it is not active now. There is no major sentinel tag. There is concern of a possible anal fistula but MRI and exam are negative for that. I think he has some mild irritation gluteal folliculitis.  His history of intermittent painless bleeding and occasional mucus smearing makes me wonder if it is more like prolapsing hemorrhoids. He has at least grade 2 internal hemorrhoids in the right posterior looks like it intermittently prolapse is like a Grade 3. Some clusters of purplish  varicosity suspicious for prior intermittent thrombosis.  I would lean towards anorectal examination under anesthesia with hemorrhoidal ligation pexing possible hemorrhoidectomy. This will allow me to more aggressively evaluate and rule out something else. That may help break the cycle of rectal bleeding.  Because he's not had any problems in the past month, they will think about things. He does note that he gets bleeding about every 2-3 weeks so he is overdue. They are open There up in the idea of double check and with cardiology that is safe to proceed with surgery. He has good echocardiogram from last year and is off Plavix more than a year out from his stenting some most likely not going to be an issue. I'm fine with the patient standing on low-dose aspirin perioperatively. HISTORY OF ADENOMATOUS POLYP OF COLON (Z86.010) Impression: He is overdue for colonoscopy for three-year follow-up given his history of a tubular adenoma polyp 2017. That was postponed given his coronary stent in the [place] pandemic. I think it would be wise for him to get a colonoscopy before hemorrhoid surgery to make sure there is no other possible etiology. Current Plans Pt Education - Polyps in the Colon and Rectum (Colonic and Rectal Polyps): colonic polyps PROLAPSED INTERNAL HEMORRHOIDS, GRADE 2 (K64.1) Impression: I suspect his penis rectal bleeding and occasional mucus discharge. Hemorrhoids. I think he would benefit from hemorrhoidal ligation and pexy and possible hemorrhoidectomy. There is no focal point to try to band the day and usually banding does not work great for rectal bleeding anyway, especially if there seems to be an external component as well  The anatomy & physiology of the anorectal region was discussed. The pathophysiology of hemorrhoids and differential diagnosis was discussed. Natural history progression was discussed. I stressed the importance of a bowel regimen to have daily soft bowel movements to  minimize progression of disease. Goal of one BM / day ideal. Use of wet wipes, warm baths, avoiding straining, etc were emphasized.  Educational handouts further explaining the pathology, treatment options, and bowel regimen were given as well. The patient expressed understanding. Current Plans ANOSCOPY, DIAGNOSTIC (41937) Pt Education - Pamphlet Given - The  Hemorrhoid Book: discussed with patient and provided information. You are being scheduled for surgery- Our schedulers will call you.  You should hear from our office's scheduling department within 5 working days about the location, date, and time of surgery. We try to make accommodations for patient's preferences in scheduling surgery, but sometimes the OR schedule or the surgeon's schedule prevents [place] from making those accommodations.  If you have not heard from our office [phone]) in 5 working days, call the office and ask for your surgeon's nurse.  If you have other questions about your diagnosis, plan, or surgery, call the office and ask for your surgeon's nurse.  Pt Education - [facility] Hemorrhoids (Duquan Gillooly): discussed with patient and provided information. STATUS POST INSERTION OF DRUG-ELUTING STENT INTO LEFT ANTERIOR DESCENDING (LAD) ARTERY FOR CORONARY ARTERY DISEASE (Z95.5) Impression: He is off Plavix anticoagulation for the past 2 months. Just on low-dose aspirin. I wonder Plavix contributed to that but patient and wife note he was having rectal bleeding prior to his Plavix & did not seem to be more intense on the Plavix per se.    Signed electronically by Adin Hector, MD (07/15/2020 10:03 AM)  Adin Hector, MD, FACS, MASCRS Gastrointestinal and Minimally Invasive Surgery  [facility] [address], [place], [place] [address] [phone] Fax [phone] Main/Paging  CONTACT INFORMATION: Weekday (9AM-5PM) concerns: Call [facility] main office at [phone] Weeknight (5PM-9AM) or  Weekend/Holiday concerns: Check [email or web address] for General Surgery [facility] coverage (Please, do not use SecureChat as it is not reliable communication to operating surgeons for immediate patient care)

## 2020-07-15 NOTE — Telephone Encounter (Signed)
He can be scheduled for pre visit and colon for rectal bleeding in the [place].  Thank you

## 2020-07-15 NOTE — Telephone Encounter (Signed)
Patients wife called wanting to schedule for colonoscopy at this time however the patient has recall for 09/2021 she states he was suppose to have one done last year but didn't due to covid please advise.

## 2020-07-22 ENCOUNTER — Other Ambulatory Visit: Payer: Self-pay

## 2020-07-22 DIAGNOSIS — I739 Peripheral vascular disease, unspecified: Secondary | ICD-10-CM

## 2020-07-25 ENCOUNTER — Telehealth (HOSPITAL_COMMUNITY): Payer: Self-pay | Admitting: Cardiology

## 2020-07-25 NOTE — Telephone Encounter (Signed)
Medical clearance completed by Dr [name] Patient is approved to proceed with EUA with possible hemorrhoidectomy under general anesthesia    from a cardiac standpoint. Low to moderate risk Medication prep-none  Faxed to [place], [place] Fax [phone] .

## 2020-07-28 ENCOUNTER — Telehealth: Payer: Self-pay

## 2020-07-28 LAB — IFOBT (OCCULT BLOOD): IFOBT: NEGATIVE

## 2020-07-28 NOTE — Telephone Encounter (Signed)
Noted pt will remain on the schedule

## 2020-07-28 NOTE — Telephone Encounter (Signed)
-----   Message from Milus Banister, MD sent at 07/28/2020  2:36 PM EDT ----- Definitely OK to proceed with colonoscopy.  Thanks  \ ----- Message ----- From: Noralyn Pick, NP Sent: 07/28/2020   2:08 PM EDT To: Milus Banister, MD  Dr. Ardis Hughs, see note below. Do you want patient to proceed with colonoscopy as scheduled? Pls let Keon Benscoter know. Thx  ----- Message ----- From: Timothy Lasso, RN Sent: 07/28/2020  11:00 AM EDT To: Noralyn Pick, NP  He is scheduled for pre visit and colonoscopy with Dr Ardis Hughs.  Should he keep that appt?  ----- Message ----- From: Noralyn Pick, NP Sent: 07/28/2020  10:51 AM EDT To: Timothy Lasso, RN  Abrea Henle, pls contact the patient and schedule him for a follow up appt with Dr. Ardis Hughs, next available. Thx  ----- Message ----- From: Milus Banister, MD Sent: 07/28/2020   7:18 AM EDT To: Noralyn Pick, NP  Not much that we can really do.  Offer him OV with me, my next available.  Thanks  ----- Message ----- From: Noralyn Pick, NP Sent: 07/27/2020  12:58 PM EDT To: Milus Banister, MD  Hi Dr. Ardis Hughs, this is the patient who had the bleeding area outside of the anal area. MRI did not show evidence of a fistula. Dr. Johney Maine did not see the bleeding area we were concerned about, see his consult note 7/20.

## 2020-07-30 ENCOUNTER — Encounter: Payer: Self-pay | Admitting: Gastroenterology

## 2020-07-30 ENCOUNTER — Ambulatory Visit (AMBULATORY_SURGERY_CENTER): Payer: Self-pay | Admitting: *Deleted

## 2020-07-30 VITALS — Ht 67.5 in | Wt 223.0 lb

## 2020-07-30 DIAGNOSIS — K625 Hemorrhage of anus and rectum: Secondary | ICD-10-CM

## 2020-07-30 NOTE — Progress Notes (Signed)

## 2020-08-04 ENCOUNTER — Encounter: Payer: Self-pay | Admitting: Surgery

## 2020-08-04 ENCOUNTER — Ambulatory Visit (INDEPENDENT_AMBULATORY_CARE_PROVIDER_SITE_OTHER): Payer: Medicare Other | Admitting: Surgery

## 2020-08-04 ENCOUNTER — Other Ambulatory Visit: Payer: Self-pay

## 2020-08-04 ENCOUNTER — Ambulatory Visit (INDEPENDENT_AMBULATORY_CARE_PROVIDER_SITE_OTHER)
Admission: RE | Admit: 2020-08-04 | Discharge: 2020-08-04 | Disposition: A | Discharge status: Home / Self Care | Payer: Medicare Other | Source: Ambulatory Visit | Attending: Surgery | Admitting: Surgery

## 2020-08-04 ENCOUNTER — Ambulatory Visit (HOSPITAL_COMMUNITY)
Admission: RE | Admit: 2020-08-04 | Discharge: 2020-08-04 | Disposition: A | Discharge status: Home / Self Care | Payer: Medicare Other | Source: Ambulatory Visit | Attending: Surgery | Admitting: Surgery

## 2020-08-04 VITALS — BP 117/64 | HR 68 | Temp 98.0°F | Resp 20 | Ht 67.5 in | Wt 220.0 lb

## 2020-08-04 DIAGNOSIS — I70213 Atherosclerosis of native arteries of extremities with intermittent claudication, bilateral legs: Secondary | ICD-10-CM

## 2020-08-04 DIAGNOSIS — I739 Peripheral vascular disease, unspecified: Secondary | ICD-10-CM | POA: Diagnosis not present

## 2020-08-04 NOTE — Progress Notes (Signed)
[facility] [place]  Patient name: Anthony [name] MRN: [id number] DOB: 1946/07/12 Sex: male   REASON FOR VISIT:    Follow-up  HISOTRY OF PRESENT ILLNESS:    Anthony [name] a 74 y.o.malewho returns today for follow-up. He is status post right external iliac, common femoral, superficial femoral, and profunda femoral endarterectomy with bovine pericardial patch and plasty on 10/10/2014. This was done for lifestyle limiting claudication which failed conservative management. His postoperative course was uncomplicated. His quality of life  dramatically improved.    I have not seen him for 2 years.  He reports a deterioration in his quality of life and limited mobility.  In December 2017 he underwent angiography for possible stenosis. Angiography revealed his intervention to be widely patent.  The patient suffers from diabetes. His most recent hemoglobin A1c is 7.1. He is a former smoker. He takes a statin for hypercholesterolemia. He is on an ARB for hypertension.   PAST MEDICAL HISTORY:   Past Medical History:  Diagnosis Date  . Arthritis   . CAD (coronary artery disease)    stent placed 06/02/2000  . Cataract    had surgery  . Congestive heart failure ([facility])   . COPD (chronic obstructive pulmonary disease) ([place]) 08/11/2012  . Diabetes mellitus ([place])   . Hyperlipidemia   . Hypertension   . Lumbar stenosis   . Neuromuscular disorder (HCC)    neuropathy in feet  . OSA (obstructive sleep apnea) 08/11/2012   cpcap  . Peripheral vascular disease ([place])   . Pneumonia 1999  . Rosacea   . Shortness of breath    with exertion  . Sleep apnea      FAMILY HISTORY:   Family History  Problem Relation Age of Onset  . Heart disease Father 17  . Cancer Father   . Hyperlipidemia Father   . Hypertension Father   . Heart attack Father   . Cancer Mother   . Deep vein thrombosis Mother        Varicose veins  .  Diabetes Mother   . Hyperlipidemia Mother   . Hypertension Mother     SOCIAL HISTORY:   Social History   Tobacco Use  . Smoking status: Former Smoker    Packs/day: 2.00    Years: 28.00    Pack years: 56.00    Types: Cigarettes    Quit date: 08/03/1987    Years since quitting: 33.0  . Smokeless tobacco: Never Used  Substance Use Topics  . Alcohol use: Yes    Comment: weekly     ALLERGIES:   No Known Allergies   CURRENT MEDICATIONS:   Current Outpatient Medications  Medication Sig Dispense Refill  . albuterol (PROAIR HFA) 108 (90 Base) MCG/ACT inhaler Inhale 2 puffs into the lungs every 6 (six) hours as needed for wheezing or shortness of breath. 3 Inhaler 3  . ANORO ELLIPTA 62.5-25 MCG/INH AEPB USE 1 INHALATION BY MOUTH  DAILY 180 each 0  . ASPIRIN LOW DOSE 81 MG EC tablet Take 81 mg by mouth daily.    . B Complex-Folic Acid (BENFOTIAMINE MULTI-B PO) Take 1 tablet by mouth 2 (two) times daily.     . bisoprolol (ZEBETA) 5 MG tablet TAKE 1 TABLET BY MOUTH  DAILY 90 tablet 3  . CINNAMON PO Take 2,000 mg by mouth every evening.     . diclofenac sodium (VOLTAREN) 1 % GEL Apply 1 application topically daily as needed (back pain).    Marland Kitchen  doxazosin (CARDURA) 2 MG tablet Take 2 mg by mouth every evening.     Marland Kitchen doxycycline (VIBRAMYCIN) 50 MG capsule Take 1 capsule by mouth daily.    . empagliflozin (JARDIANCE) 10 MG TABS tablet Take 10 mg by mouth daily.    . fish oil-omega-3 fatty acids 1000 MG capsule Take 3 g by mouth daily.     . fluticasone (FLONASE) 50 MCG/ACT nasal spray Place 2 sprays into both nostrils as needed for allergies or rhinitis.    . furosemide (LASIX) 20 MG tablet Take 20 mg by mouth 2 (two) times daily.     Marland Kitchen GARLIC PO Take 5,784 mg by mouth daily.     . isosorbide mononitrate (IMDUR) 30 MG 24 hr tablet TAKE 1 TABLET BY MOUTH  DAILY 90 tablet 3  . L-Lysine 1000 MG TABS Take 1,000 mg by mouth daily.     Marland Kitchen losartan (COZAAR) 100 MG tablet Take 100 mg by mouth  daily.    . metFORMIN (GLUCOPHAGE) 1000 MG tablet Take 1,000 mg by mouth 2 (two) times daily with a meal.    . Multiple Vitamin (MULTIVITAMIN) tablet Take 1 tablet by mouth daily.    . pentoxifylline (TRENTAL) 400 MG CR tablet TAKE 1 TABLET BY MOUTH  TWICE A DAY 180 tablet 3  . potassium chloride SA (K-DUR,KLOR-CON) 20 MEQ tablet Take 20 mEq by mouth 2 (two) times daily.    Marland Kitchen rOPINIRole (REQUIP) 1 MG tablet Take 1 mg by mouth every evening.     . simvastatin (ZOCOR) 40 MG tablet Take 40 mg by mouth every evening.    . SUPER B COMPLEX/C PO Take 1 tablet by mouth daily.    . nitroGLYCERIN (NITROSTAT) 0.4 MG SL tablet Place 1 tablet (0.4 mg total) under the tongue every 5 (five) minutes as needed for chest pain. 90 tablet 3   No current facility-administered medications for this visit.    REVIEW OF SYSTEMS:   [X]  denotes positive finding, [ ]  denotes negative finding Cardiac  Comments:  Chest pain or chest pressure:    Shortness of breath upon exertion:    Short of breath when lying flat:    Irregular heart rhythm:        Vascular    Pain in calf, thigh, or hip brought on by ambulation: x   Pain in feet at night that wakes you up from your sleep:     Blood clot in your veins:    Leg swelling:         Pulmonary    Oxygen at home:    Productive cough:     Wheezing:         Neurologic    Sudden weakness in arms or legs:     Sudden numbness in arms or legs:     Sudden onset of difficulty speaking or slurred speech:    Temporary loss of vision in one eye:     Problems with dizziness:         Gastrointestinal    Blood in stool:     Vomited blood:         Genitourinary    Burning when urinating:     Blood in urine:        Psychiatric    Major depression:         Hematologic    Bleeding problems:    Problems with blood clotting too easily:        Skin  Rashes or ulcers:        Constitutional    Fever or chills:      PHYSICAL EXAM:   Vitals:   08/04/20 1227   BP: 117/64  Pulse: 68  Resp: 20  Temp: 98 F (36.7 C)  SpO2: 94%  Weight: 220 lb (99.8 kg)  Height: 5' 7.5" (1.715 m)    GENERAL: The patient is a well-nourished male, in no acute distress. The vital signs are documented above. CARDIAC: There is a regular rate and rhythm.  VASCULAR: Nonpalpable pedal pulses PULMONARY: Non-labored respirations ABDOMEN: Soft and non-tender with normal pitched bowel sounds.  MUSCULOSKELETAL: There are no major deformities or cyanosis. NEUROLOGIC: No focal weakness or paresthesias are detected. SKIN: There are no ulcers or rashes noted. PSYCHIATRIC: The patient has a normal affect.  STUDIES:   I have reviewed the following studies: +-------+-----------+-----------+------------+------------+  ABI/TBIToday's ABIToday's TBIPrevious ABIPrevious TBI  +-------+-----------+-----------+------------+------------+  Right 0.70    0.48    0.59    0.38      +-------+-----------+-----------+------------+------------+  Left  0.63    0.37    0.56    0.32      +-------+-----------+-----------+------------+------------+  MEDICAL ISSUES:   Progression with symptoms of bilateral claudication, with limited mobility.  We discussed proceeding with diagnostic angiography to determine what has changed since he was last evaluated.  I will plan on doing this through a right femoral approach.  If there is anything on the left leg that I can intervene on, I would do so at that time.  He is scheduled to get a colonoscopy at the end of August and so we will do this afterwards.    Leia Alf, MD, FACS [facility] [phone] Pager [phone]

## 2020-08-05 ENCOUNTER — Ambulatory Visit (INDEPENDENT_AMBULATORY_CARE_PROVIDER_SITE_OTHER): Payer: Medicare Other | Admitting: Podiatry

## 2020-08-05 ENCOUNTER — Encounter: Payer: Self-pay | Admitting: Podiatry

## 2020-08-05 DIAGNOSIS — L84 Corns and callosities: Secondary | ICD-10-CM | POA: Diagnosis not present

## 2020-08-05 DIAGNOSIS — M79674 Pain in right toe(s): Secondary | ICD-10-CM | POA: Diagnosis not present

## 2020-08-05 DIAGNOSIS — M79675 Pain in left toe(s): Secondary | ICD-10-CM | POA: Diagnosis not present

## 2020-08-05 DIAGNOSIS — E1151 Type 2 diabetes mellitus with diabetic peripheral angiopathy without gangrene: Secondary | ICD-10-CM | POA: Diagnosis not present

## 2020-08-05 DIAGNOSIS — B351 Tinea unguium: Secondary | ICD-10-CM | POA: Diagnosis not present

## 2020-08-06 NOTE — Progress Notes (Signed)
Subjective: Anthony [name] is a 74 y.o. male patient seen today at [facility]. Pt has h/o NIDDM with PAD and corn(s) right 3rd digit and painful mycotic toenails b/l that are difficult to trim. Pain interferes with ambulation. Aggravating factors include wearing enclosed shoe gear. Pain is relieved with periodic professional debridement.   He voices no new pedal problems on today's visit.  Patient Active Problem List   Diagnosis Date Noted  . Unilateral primary osteoarthritis, left knee 07/10/2019  . Bleeding external hemorrhoids suspected 06/07/2019  . Long term current use of clopidogrel 06/07/2019  . Unstable angina ([place])   . Bilateral primary osteoarthritis of knee 10/11/2017  . Chronic diastolic heart failure ([place]) 05/06/2015  . Femoral artery stenosis, right ([place]) 10/10/2014  . Preoperative clearance 09/09/2014  . Lumbar spine scoliosis 05/28/2014  . Pain in limb 12/10/2013  . Chest tightness 09/27/2013  . HTN (hypertension) 09/27/2013  . Peripheral vascular disease, unspecified ([place]) 01/08/2013  . PAD (peripheral artery disease) ([place]) 11/13/2012  . Atherosclerosis of native arteries of the extremities with intermittent claudication 11/13/2012  . Bilateral claudication of lower limb ([place]) 10/09/2012  . Coronary atherosclerosis of native coronary artery 08/26/2012  . COPD (chronic obstructive pulmonary disease) ([place]) 08/11/2012  . OSA (obstructive sleep apnea) 08/11/2012  . Diaphragm paralysis 08/11/2012  . Morbid obesity with BMI of 40.0-44.9, adult ([place]) 08/11/2012    Current Outpatient Medications on File Prior to Visit  Medication Sig Dispense Refill  . albuterol (PROAIR HFA) 108 (90 Base) MCG/ACT inhaler Inhale 2 puffs into the lungs every 6 (six) hours as needed for wheezing or shortness of breath. 3 Inhaler 3  . ANORO ELLIPTA 62.5-25 MCG/INH AEPB USE 1 INHALATION BY MOUTH  DAILY 180 each 0  . ASPIRIN LOW DOSE 81 MG EC tablet Take 81 mg by mouth daily.    . B  Complex-Folic Acid (BENFOTIAMINE MULTI-B PO) Take 1 tablet by mouth 2 (two) times daily.     . bisoprolol (ZEBETA) 5 MG tablet TAKE 1 TABLET BY MOUTH  DAILY 90 tablet 3  . CINNAMON PO Take 2,000 mg by mouth every evening.     . diclofenac sodium (VOLTAREN) 1 % GEL Apply 1 application topically daily as needed (back pain).    Marland Kitchen doxazosin (CARDURA) 2 MG tablet Take 2 mg by mouth every evening.     Marland Kitchen doxycycline (VIBRAMYCIN) 50 MG capsule Take 1 capsule by mouth daily.    . empagliflozin (JARDIANCE) 10 MG TABS tablet Take 10 mg by mouth daily.    . fish oil-omega-3 fatty acids 1000 MG capsule Take 3 g by mouth daily.     . fluticasone (FLONASE) 50 MCG/ACT nasal spray Place 2 sprays into both nostrils as needed for allergies or rhinitis.    . furosemide (LASIX) 20 MG tablet Take 20 mg by mouth 2 (two) times daily.     Marland Kitchen GARLIC PO Take 2,956 mg by mouth daily.     . isosorbide mononitrate (IMDUR) 30 MG 24 hr tablet TAKE 1 TABLET BY MOUTH  DAILY 90 tablet 3  . L-Lysine 1000 MG TABS Take 1,000 mg by mouth daily.     Marland Kitchen losartan (COZAAR) 100 MG tablet Take 100 mg by mouth daily.    . metFORMIN (GLUCOPHAGE) 1000 MG tablet Take 1,000 mg by mouth 2 (two) times daily with a meal.    . Multiple Vitamin (MULTIVITAMIN) tablet Take 1 tablet by mouth daily.    . nitroGLYCERIN (NITROSTAT) 0.4 MG SL  tablet Place 1 tablet (0.4 mg total) under the tongue every 5 (five) minutes as needed for chest pain. 90 tablet 3  . pentoxifylline (TRENTAL) 400 MG CR tablet TAKE 1 TABLET BY MOUTH  TWICE A DAY 180 tablet 3  . potassium chloride SA (K-DUR,KLOR-CON) 20 MEQ tablet Take 20 mEq by mouth 2 (two) times daily.    Marland Kitchen rOPINIRole (REQUIP) 1 MG tablet Take 1 mg by mouth every evening.     . simvastatin (ZOCOR) 40 MG tablet Take 40 mg by mouth every evening.    . SUPER B COMPLEX/C PO Take 1 tablet by mouth daily.     No current facility-administered medications on file prior to visit.    No Known  Allergies  Objective: Physical Exam  General: Well developed, nourished, no acute distress, awake, alert and oriented x 3  Neurovascular Examination: Neurovascular status unchanged b/l. DP pulses faintly palpable b/l. PT pulse palpable RLE; nonpalpable LLE. No pedal hair present. Skin temperature gradient WNL. Sensation diminished with 10 gram monofilament.  Dermatological:  Pedal skin is thin shiny, atrophic b/l lower extremities. No open wounds bilaterally. No interdigital macerations bilaterally. Toenails L 3rd toe, L 4th toe, L 5th toe, R hallux, R 3rd toe, R 4th toe and R 5th toe elongated, discolored, dystrophic, thickened, and crumbly with subungual debris and tenderness to dorsal palpation. Anonychia noted L hallux, L 2nd toe and R 2nd toe. Nailbed(s) epithelialized.  Hyperkeratotic lesion(s) L 3rd toe and R 3rd toe.  No erythema, no edema, no drainage, no flocculence.  Musculoskeletal:  Normal muscle strength 5/5 to all lower extremity muscle groups bilaterally. No pain crepitus or joint limitation noted with ROM b/l. Hammertoes noted to the 2-5 bilaterally.  Assessment and Plan:  1. Pain due to onychomycosis of toenails of both feet   2. Corns   3. Type II diabetes mellitus with peripheral circulatory disorder (HCC)    -Examined patient. -Continue diabetic foot care principles. -Toenails L 3rd toe, L 4th toe, L 5th toe, R hallux, R 3rd toe, R 4th toe and R 5th toe debrided in length and girth without iatrogenic bleeding with sterile nail nipper and dremel.  -Corn(s) L 3rd toe and R 3rd toe pared utilizing sterile scalpel blade without complication or incident. Total number debrided=2. -Patient to report any pedal injuries to medical professional immediately. -Patient to continue soft, supportive shoe gear daily. -Patient/POA to call should there be question/concern in the interim.   Return in about 3 months (around 11/05/2020) for diabetic nail and callus trim.  Marzetta Board, DPM

## 2020-08-12 ENCOUNTER — Telehealth (HOSPITAL_COMMUNITY): Payer: Self-pay

## 2020-08-12 NOTE — Telephone Encounter (Signed)
Surgical Clearance form was sent from [facility] for EUA with possible Hemorrhoidectomy, under general anesthesia.    Per Dr. [name]: "ok to proceed"   Form faxed back to: [phone] ATTN: Illene Regulus, CMA

## 2020-08-13 ENCOUNTER — Other Ambulatory Visit: Payer: Self-pay

## 2020-08-17 ENCOUNTER — Other Ambulatory Visit: Payer: Self-pay | Admitting: Vascular Surgery

## 2020-08-23 ENCOUNTER — Other Ambulatory Visit: Payer: Self-pay | Admitting: Pulmonary Disease

## 2020-08-26 ENCOUNTER — Ambulatory Visit (AMBULATORY_SURGERY_CENTER): Payer: Medicare Other | Admitting: Gastroenterology

## 2020-08-26 ENCOUNTER — Other Ambulatory Visit: Payer: Self-pay

## 2020-08-26 ENCOUNTER — Encounter: Payer: Self-pay | Admitting: Gastroenterology

## 2020-08-26 VITALS — BP 104/65 | HR 72 | Temp 97.5°F | Resp 17 | Ht 67.5 in | Wt 223.0 lb

## 2020-08-26 DIAGNOSIS — D122 Benign neoplasm of ascending colon: Secondary | ICD-10-CM

## 2020-08-26 DIAGNOSIS — D120 Benign neoplasm of cecum: Secondary | ICD-10-CM

## 2020-08-26 DIAGNOSIS — K5730 Diverticulosis of large intestine without perforation or abscess without bleeding: Secondary | ICD-10-CM

## 2020-08-26 DIAGNOSIS — K625 Hemorrhage of anus and rectum: Secondary | ICD-10-CM

## 2020-08-26 DIAGNOSIS — Z86010 Personal history of colonic polyps: Secondary | ICD-10-CM

## 2020-08-26 HISTORY — PX: COLONOSCOPY: SHX174

## 2020-08-26 MED ORDER — SODIUM CHLORIDE 0.9 % IV SOLN: 500.0000 mL | Freq: Once | INTRAVENOUS | Status: DC

## 2020-08-26 NOTE — Patient Instructions (Signed)
Handouts provided on polyps and diverticulosis.  ? ?YOU HAD AN ENDOSCOPIC PROCEDURE TODAY AT THE [facility]:   Refer to the procedure report that was given to you for any specific questions about what was found during the examination.  If the procedure report does not answer your questions, please call your gastroenterologist to clarify.  If you requested that your care partner not be given the details of your procedure findings, then the procedure report has been included in a sealed envelope for you to review at your convenience later. ? ?YOU SHOULD EXPECT: Some feelings of bloating in the abdomen. Passage of more gas than usual.  Walking can help get rid of the air that was put into your GI tract during the procedure and reduce the bloating. If you had a lower endoscopy (such as a colonoscopy or flexible sigmoidoscopy) you may notice spotting of blood in your stool or on the toilet paper. If you underwent a bowel prep for your procedure, you may not have a normal bowel movement for a few days. ? ?Please Note:  You might notice some irritation and congestion in your nose or some drainage.  This is from the oxygen used during your procedure.  There is no need for concern and it should clear up in a day or so. ? ?SYMPTOMS TO REPORT IMMEDIATELY: ? ?Following lower endoscopy (colonoscopy or flexible sigmoidoscopy): ? Excessive amounts of blood in the stool ? Significant tenderness or worsening of abdominal pains ? Swelling of the abdomen that is new, acute ? Fever of 100?F or higher ? ?For urgent or emergent issues, a gastroenterologist can be reached at any hour by calling [phone]. ?Do not use MyChart messaging for urgent concerns.  ? ? ?DIET:  We do recommend a small meal at first, but then you may proceed to your regular diet.  Drink plenty of fluids but you should avoid alcoholic beverages for 24 hours. ? ?ACTIVITY:  You should plan to take it easy for the rest of today and you should NOT  DRIVE or use heavy machinery until tomorrow (because of the sedation medicines used during the test).   ? ?FOLLOW UP: ?Our staff will call the number listed on your records 48-72 hours following your procedure to check on you and address any questions or concerns that you may have regarding the information given to you following your procedure. If we do not reach you, we will leave a message.  We will attempt to reach you two times.  During this call, we will ask if you have developed any symptoms of COVID 19. If you develop any symptoms (ie: fever, flu-like symptoms, shortness of breath, cough etc.) before then, please call [phone].  If you test positive for Covid 19 in the 2 weeks post procedure, please call and report this information to us.   ? ?If any biopsies were taken you will be contacted by phone or by letter within the next 1-3 weeks.  Please call us at [phone] if you have not heard about the biopsies in 3 weeks.  ? ? ?SIGNATURES/CONFIDENTIALITY: ?You and/or your care partner have signed paperwork which will be entered into your electronic medical record.  These signatures attest to the fact that that the information above on your After Visit Summary has been reviewed and is understood.  Full responsibility of the confidentiality of this discharge information lies with you and/or your care-partner. ? ?

## 2020-08-26 NOTE — Progress Notes (Signed)
1037 Ephedrine 10 mg given IV due to low BP, MD updated.

## 2020-08-26 NOTE — Progress Notes (Signed)
Pt's states no medical or surgical changes since previsit or office visit.  Vitals SF

## 2020-08-26 NOTE — Progress Notes (Signed)
Called to room to assist during endoscopic procedure.  Patient ID and intended procedure confirmed with present staff. Received instructions for my participation in the procedure from the performing physician.  

## 2020-08-26 NOTE — Op Note (Signed)
[facility] Patient Name: Anthony [name] Procedure Date: 08/26/2020 10:04 AM MRN: [id number] Endoscopist: Milus Banister , MD Age: 74 Referring MD:  Date of Birth: January 16, 1946 Gender: Male Account #: [id number] Procedure:                Colonoscopy Indications:              Rectal bleeding; FH of colon cancer (father,                            diagnosed in his 70s)Colonoscopy 2006;our records                            only show two color pictures from this report with                            no text. [facility]. Diverticulosis was found. Onesmall                            (removed with biopsy forceps)hyperplastic by                            pathology polyps wasfound.Colonoscopy Dr. Ardis Hughs                            October 2065found left-sided diverticulosis, a                            single 4 mm polyp was removed from the sigmoid                            colon, it was a tubular adenoma Medicines:                Monitored Anesthesia Care Procedure:                Pre-Anesthesia Assessment:                           - Prior to the procedure, a History and Physical                            was performed, and patient medications and                            allergies were reviewed. The patient's tolerance of                            previous anesthesia was also reviewed. The risks                            and benefits of the procedure and the sedation  options and risks were discussed with the patient.                            All questions were answered, and informed consent                            was obtained. Prior Anticoagulants: The patient has                            taken no previous anticoagulant or antiplatelet                            agents. ASA Grade Assessment: III - A patient with                            severe systemic  disease. After reviewing the risks                            and benefits, the patient was deemed in                            satisfactory condition to undergo the procedure.                           After obtaining informed consent, the colonoscope                            was passed under direct vision. Throughout the                            procedure, the patient's blood pressure, pulse, and                            oxygen saturations were monitored continuously. The                            Colonoscope was introduced through the anus and                            advanced to the the cecum, identified by                            appendiceal orifice and ileocecal valve. The                            colonoscopy was performed without difficulty. The                            patient tolerated the procedure well. The quality                            of the bowel preparation was good. The ileocecal  valve, appendiceal orifice, and rectum were                            photographed. Scope In: 10:26:25 AM Scope Out: 10:39:16 AM Scope Withdrawal Time: 0 hours 8 minutes 15 seconds  Total Procedure Duration: 0 hours 12 minutes 51 seconds  Findings:                 Two sessile polyps were found in the ascending                            colon and cecum. The polyps were 3 to 4 mm in size.                            These polyps were removed with a cold snare.                            Resection and retrieval were complete.                           Multiple small and large-mouthed diverticula were                            found in the left colon.                           The exam was otherwise without abnormality on                            direct and retroflexion views. Complications:            No immediate complications. Estimated blood loss:                            None. Estimated Blood Loss:     Estimated blood loss:  none. Impression:               - Two 3 to 4 mm polyps in the ascending colon and                            in the cecum, removed with a cold snare. Resected                            and retrieved.                           - Diverticulosis in the left colon.                           - The examination was otherwise normal on direct                            and retroflexion views. Recommendation:           - Patient has a contact number available for  emergencies. The signs and symptoms of potential                            delayed complications were discussed with the                            patient. Return to normal activities tomorrow.                            Written discharge instructions were provided to the                            patient.                           - Resume previous diet.                           - Continue present medications.                           - Await pathology results. Milus Banister, MD 08/26/2020 10:42:34 AM This report has been signed electronically.

## 2020-08-26 NOTE — Progress Notes (Signed)
Report given to PACU, vss 

## 2020-08-28 ENCOUNTER — Telehealth: Payer: Self-pay

## 2020-08-28 ENCOUNTER — Telehealth: Payer: Self-pay | Admitting: *Deleted

## 2020-08-28 NOTE — Telephone Encounter (Signed)
  Follow up Call-  Call back number 08/26/2020  Post procedure Call Back phone  # [phone]  Permission to leave phone message Yes  Some recent data might be hidden     Patient questions:  Do you have a fever, pain , or abdominal swelling? No. Pain Score  0 *  Have you tolerated food without any problems? Yes.    Have you been able to return to your normal activities? Yes.    Do you have any questions about your discharge instructions: Diet   No. Medications  No. Follow up visit  No.  Do you have questions or concerns about your Care? No.  Actions: * If pain score is 4 or above: No action needed, pain <4.  1. Have you developed a fever since your procedure no  2.   Have you had an respiratory symptoms (SOB or cough) since your procedure? no  3.   Have you tested positive for COVID 19 since your procedure no  4.   Have you had any family members/close contacts diagnosed with the COVID 19 since your procedure?  no   If yes to any of these questions please route to Joylene John, RN and Joella Prince, RN

## 2020-08-28 NOTE — Telephone Encounter (Signed)
First post procedure follow up call, no answer 

## 2020-09-15 ENCOUNTER — Other Ambulatory Visit (HOSPITAL_COMMUNITY)
Admission: RE | Admit: 2020-09-15 | Discharge: 2020-09-15 | Disposition: A | Discharge status: Home / Self Care | Payer: Medicare Other | Source: Ambulatory Visit | Attending: Surgery | Admitting: Surgery

## 2020-09-15 DIAGNOSIS — Z20822 Contact with and (suspected) exposure to covid-19: Secondary | ICD-10-CM | POA: Diagnosis not present

## 2020-09-15 DIAGNOSIS — Z01812 Encounter for preprocedural laboratory examination: Secondary | ICD-10-CM | POA: Diagnosis present

## 2020-09-15 LAB — SARS CORONAVIRUS 2 (TAT 6-24 HRS): SARS Coronavirus 2: NEGATIVE

## 2020-09-16 ENCOUNTER — Encounter (HOSPITAL_COMMUNITY)
Admission: RE | Disposition: A | Discharge status: Home / Self Care | Payer: Self-pay | Source: Ambulatory Visit | Attending: Surgery

## 2020-09-16 ENCOUNTER — Other Ambulatory Visit: Payer: Self-pay

## 2020-09-16 ENCOUNTER — Ambulatory Visit (HOSPITAL_COMMUNITY)
Admission: RE | Admit: 2020-09-16 | Discharge: 2020-09-16 | Disposition: A | Discharge status: Home / Self Care | Payer: Medicare Other | Source: Ambulatory Visit | Attending: Surgery | Admitting: Surgery

## 2020-09-16 ENCOUNTER — Encounter (HOSPITAL_COMMUNITY): Payer: Self-pay | Admitting: Surgery

## 2020-09-16 DIAGNOSIS — Z79899 Other long term (current) drug therapy: Secondary | ICD-10-CM | POA: Diagnosis not present

## 2020-09-16 DIAGNOSIS — G4733 Obstructive sleep apnea (adult) (pediatric): Secondary | ICD-10-CM | POA: Insufficient documentation

## 2020-09-16 DIAGNOSIS — E1151 Type 2 diabetes mellitus with diabetic peripheral angiopathy without gangrene: Secondary | ICD-10-CM | POA: Diagnosis present

## 2020-09-16 DIAGNOSIS — Z7982 Long term (current) use of aspirin: Secondary | ICD-10-CM | POA: Insufficient documentation

## 2020-09-16 DIAGNOSIS — Z955 Presence of coronary angioplasty implant and graft: Secondary | ICD-10-CM | POA: Diagnosis not present

## 2020-09-16 DIAGNOSIS — E785 Hyperlipidemia, unspecified: Secondary | ICD-10-CM | POA: Diagnosis not present

## 2020-09-16 DIAGNOSIS — I2510 Atherosclerotic heart disease of native coronary artery without angina pectoris: Secondary | ICD-10-CM | POA: Insufficient documentation

## 2020-09-16 DIAGNOSIS — J449 Chronic obstructive pulmonary disease, unspecified: Secondary | ICD-10-CM | POA: Insufficient documentation

## 2020-09-16 DIAGNOSIS — I110 Hypertensive heart disease with heart failure: Secondary | ICD-10-CM | POA: Diagnosis not present

## 2020-09-16 DIAGNOSIS — Z7984 Long term (current) use of oral hypoglycemic drugs: Secondary | ICD-10-CM | POA: Insufficient documentation

## 2020-09-16 DIAGNOSIS — I509 Heart failure, unspecified: Secondary | ICD-10-CM | POA: Insufficient documentation

## 2020-09-16 DIAGNOSIS — E1140 Type 2 diabetes mellitus with diabetic neuropathy, unspecified: Secondary | ICD-10-CM | POA: Insufficient documentation

## 2020-09-16 DIAGNOSIS — Z87891 Personal history of nicotine dependence: Secondary | ICD-10-CM | POA: Insufficient documentation

## 2020-09-16 DIAGNOSIS — I70213 Atherosclerosis of native arteries of extremities with intermittent claudication, bilateral legs: Secondary | ICD-10-CM | POA: Insufficient documentation

## 2020-09-16 HISTORY — PX: ABDOMINAL AORTOGRAM W/LOWER EXTREMITY: CATH118223

## 2020-09-16 LAB — BASIC METABOLIC PANEL
Anion gap: 8 (ref 5–15)
BUN: 32 mg/dL — ABNORMAL HIGH (ref 8–23)
CO2: 23 mmol/L (ref 22–32)
Calcium: 9.4 mg/dL (ref 8.9–10.3)
Chloride: 106 mmol/L (ref 98–111)
Creatinine, Ser: 1.34 mg/dL — ABNORMAL HIGH (ref 0.61–1.24)
GFR calc Af Amer: 59 mL/min — ABNORMAL LOW (ref >60)
GFR calc non Af Amer: 51 mL/min — ABNORMAL LOW (ref >60)
Glucose, Bld: 170 mg/dL — ABNORMAL HIGH (ref 70–99)
Potassium: 4.2 mmol/L (ref 3.5–5.1)
Sodium: 137 mmol/L (ref 135–145)

## 2020-09-16 LAB — GLUCOSE, CAPILLARY: Glucose-Capillary: 138 mg/dL — ABNORMAL HIGH (ref 70–99)

## 2020-09-16 SURGERY — ABDOMINAL AORTOGRAM W/LOWER EXTREMITY
Anesthesia: LOCAL

## 2020-09-16 MED ORDER — IODIXANOL 320 MG/ML IV SOLN
INTRAVENOUS | Status: DC | PRN
  Administered 2020-09-16: 145 mL

## 2020-09-16 MED ORDER — LIDOCAINE HCL (PF) 1 % IJ SOLN
INTRAMUSCULAR | Status: AC
  Filled 2020-09-16: qty 30

## 2020-09-16 MED ORDER — FENTANYL CITRATE (PF) 100 MCG/2ML IJ SOLN
INTRAMUSCULAR | Status: AC
  Filled 2020-09-16: qty 2

## 2020-09-16 MED ORDER — HEPARIN (PORCINE) IN NACL 1000-0.9 UT/500ML-% IV SOLN
INTRAVENOUS | Status: DC | PRN
  Administered 2020-09-16: 500 mL

## 2020-09-16 MED ORDER — FENTANYL CITRATE (PF) 100 MCG/2ML IJ SOLN
INTRAMUSCULAR | Status: DC | PRN
  Administered 2020-09-16: 50 ug via INTRAVENOUS

## 2020-09-16 MED ORDER — SODIUM CHLORIDE 0.9 % IV SOLN: INTRAVENOUS | Status: DC

## 2020-09-16 MED ORDER — MIDAZOLAM HCL 2 MG/2ML IJ SOLN
INTRAMUSCULAR | Status: AC
  Filled 2020-09-16: qty 2

## 2020-09-16 MED ORDER — MIDAZOLAM HCL 2 MG/2ML IJ SOLN
INTRAMUSCULAR | Status: DC | PRN
  Administered 2020-09-16: 2 mg via INTRAVENOUS

## 2020-09-16 MED ORDER — SODIUM CHLORIDE 0.9 % WEIGHT BASED INFUSION: 1.0000 mL/kg/h | INTRAVENOUS | Status: DC

## 2020-09-16 MED ORDER — LIDOCAINE HCL (PF) 1 % IJ SOLN
INTRAMUSCULAR | Status: DC | PRN
  Administered 2020-09-16: 15 mL

## 2020-09-16 MED ORDER — HEPARIN (PORCINE) IN NACL 1000-0.9 UT/500ML-% IV SOLN
INTRAVENOUS | Status: AC
  Filled 2020-09-16: qty 1000

## 2020-09-16 SURGICAL SUPPLY — 10 items
CATH OMNI FLUSH 5F 65CM (CATHETERS) ×2 IMPLANT
KIT MICROPUNCTURE NIT STIFF (SHEATH) ×2 IMPLANT
KIT PV (KITS) ×2 IMPLANT
SHEATH PINNACLE 5F 10CM (SHEATH) ×2 IMPLANT
SHEATH PROBE COVER 6X72 (BAG) ×2 IMPLANT
SYR MEDRAD MARK V 150ML (SYRINGE) ×2 IMPLANT
TRANSDUCER W/STOPCOCK (MISCELLANEOUS) ×2 IMPLANT
TRAY PV CATH (CUSTOM PROCEDURE TRAY) ×2 IMPLANT
WIRE ROSEN-J .035X180CM (WIRE) ×2 IMPLANT
WIRE STARTER BENTSON 035X150 (WIRE) ×2 IMPLANT

## 2020-09-16 NOTE — Discharge Instructions (Signed)
May resume Metformin on 09/19/20  Femoral Site Care This sheet gives you information about how to care for yourself after your procedure. Your health care provider may also give you more specific instructions. If you have problems or questions, contact your health care provider. What can I expect after the procedure?  After the procedure, it is common to have:  Bruising that usually fades within 1-2 weeks.  Tenderness at the site. Follow these instructions at home: Wound care 1. Follow instructions from your health care provider about how to take care of your insertion site. Make sure you: ? Wash your hands with soap and water before you change your bandage (dressing). If soap and water are not available, use hand sanitizer. ? Remove your dressing as told by your health care provider. In 24 hours 2. Do not take baths, swim, or use a hot tub until your health care provider approves. 3. You may shower 24-48 hours after the procedure or as told by your health care provider. ? Gently wash the site with plain soap and water. ? Pat the area dry with a clean towel. ? Do not rub the site. This may cause bleeding. 4. Do not apply powder or lotion to the site. Keep the site clean and dry. 5. Check your femoral site every day for signs of infection. Check for: ? Redness, swelling, or pain. ? Fluid or blood. ? Warmth. ? Pus or a bad smell. Activity 1. For the first 2-3 days after your procedure, or as long as directed: ? Avoid climbing stairs as much as possible. ? Do not squat. 2. Do not lift anything that is heavier than 10 lb (4.5 kg), or the limit that you are told, until your health care provider says that it is safe. For 5 days 3. Rest as directed. ? Avoid sitting for a long time without moving. Get up to take short walks every 1-2 hours. 4. Do not drive for 24 hours if you were given a medicine to help you relax (sedative). General instructions  Take over-the-counter and prescription  medicines only as told by your health care provider.  Keep all follow-up visits as told by your health care provider. This is important. Contact a health care provider if you have:  A fever or chills.  You have redness, swelling, or pain around your insertion site. Get help right away if:  The catheter insertion area swells very fast.  You pass out.  You suddenly start to sweat or your skin gets clammy.  The catheter insertion area is bleeding, and the bleeding does not stop when you hold steady pressure on the area.  The area near or just beyond the catheter insertion site becomes pale, cool, tingly, or numb. These symptoms may represent a serious problem that is an emergency. Do not wait to see if the symptoms will go away. Get medical help right away. Call your local emergency services (911 in the [place] Do not drive yourself to the hospital. Summary  After the procedure, it is common to have bruising that usually fades within 1-2 weeks.  Check your femoral site every day for signs of infection.  Do not lift anything that is heavier than 10 lb (4.5 kg), or the limit that you are told, until your health care provider says that it is safe. This information is not intended to replace advice given to you by your health care provider. Make sure you discuss any questions you have with your health care provider.  Document Revised: 12/26/2017 Document Reviewed: 12/26/2017 Elsevier Patient Education  2020 [employer].

## 2020-09-16 NOTE — H&P (Signed)
[facility] [place]  Patient name: Anthony [name]        MRN: [id number]        DOB: 07-11-1946          Sex: male   REASON FOR VISIT:    Follow-up  HISOTRY OF PRESENT ILLNESS:    Anthony [name] a 74 y.o.malewho returns today for follow-up. He is status post right external iliac, common femoral, superficial femoral, and profunda femoral endarterectomy with bovine pericardial patch and plasty on 10/10/2014. This was done for lifestyle limiting claudication which failed conservative management. His postoperative course was uncomplicated. His quality of life  dramatically improved.   I have not seen him for 2 years.  He reports a deterioration in his quality of life and limited mobility.  In December 2017 he underwent angiography for possible stenosis. Angiography revealed his intervention to be widely patent.  The patient suffers from diabetes. His most recent hemoglobin A1c is 7.1. He is a former smoker. He takes a statin for hypercholesterolemia. He is on an ARB for hypertension.   PAST MEDICAL HISTORY:       Past Medical History:  Diagnosis Date  . Arthritis   . CAD (coronary artery disease)    stent placed 06/02/2000  . Cataract    had surgery  . Congestive heart failure ([place])   . COPD (chronic obstructive pulmonary disease) ([place]) 08/11/2012  . Diabetes mellitus ([place])   . Hyperlipidemia   . Hypertension   . Lumbar stenosis   . Neuromuscular disorder ([facility])    neuropathy in feet  . OSA (obstructive sleep apnea) 08/11/2012   cpcap  . Peripheral vascular disease ([place])   . Pneumonia 1999  . Rosacea   . Shortness of breath    with exertion  . Sleep apnea      FAMILY HISTORY:        Family History  Problem Relation Age of Onset  . Heart disease Father 39  . Cancer Father   . Hyperlipidemia Father   . Hypertension Father   . Heart attack Father   . Cancer Mother   . Deep  vein thrombosis Mother        Varicose veins  . Diabetes Mother   . Hyperlipidemia Mother   . Hypertension Mother     SOCIAL HISTORY:   Social History        Tobacco Use  . Smoking status: Former Smoker    Packs/day: 2.00    Years: 28.00    Pack years: 56.00    Types: Cigarettes    Quit date: 08/03/1987    Years since quitting: 33.0  . Smokeless tobacco: Never Used  Substance Use Topics  . Alcohol use: Yes    Comment: weekly     ALLERGIES:   No Known Allergies   CURRENT MEDICATIONS:         Current Outpatient Medications  Medication Sig Dispense Refill  . albuterol (PROAIR HFA) 108 (90 Base) MCG/ACT inhaler Inhale 2 puffs into the lungs every 6 (six) hours as needed for wheezing or shortness of breath. 3 Inhaler 3  . ANORO ELLIPTA 62.5-25 MCG/INH AEPB USE 1 INHALATION BY MOUTH  DAILY 180 each 0  . ASPIRIN LOW DOSE 81 MG EC tablet Take 81 mg by mouth daily.    . B Complex-Folic Acid (BENFOTIAMINE MULTI-B PO) Take 1 tablet by mouth 2 (two) times daily.     . bisoprolol (ZEBETA) 5 MG tablet TAKE 1  TABLET BY MOUTH  DAILY 90 tablet 3  . CINNAMON PO Take 2,000 mg by mouth every evening.     . diclofenac sodium (VOLTAREN) 1 % GEL Apply 1 application topically daily as needed (back pain).    Marland Kitchen doxazosin (CARDURA) 2 MG tablet Take 2 mg by mouth every evening.     Marland Kitchen doxycycline (VIBRAMYCIN) 50 MG capsule Take 1 capsule by mouth daily.    . empagliflozin (JARDIANCE) 10 MG TABS tablet Take 10 mg by mouth daily.    . fish oil-omega-3 fatty acids 1000 MG capsule Take 3 g by mouth daily.     . fluticasone (FLONASE) 50 MCG/ACT nasal spray Place 2 sprays into both nostrils as needed for allergies or rhinitis.    . furosemide (LASIX) 20 MG tablet Take 20 mg by mouth 2 (two) times daily.     Marland Kitchen GARLIC PO Take 5,697 mg by mouth daily.     . isosorbide mononitrate (IMDUR) 30 MG 24 hr tablet TAKE 1 TABLET BY MOUTH  DAILY 90 tablet 3  .  L-Lysine 1000 MG TABS Take 1,000 mg by mouth daily.     Marland Kitchen losartan (COZAAR) 100 MG tablet Take 100 mg by mouth daily.    . metFORMIN (GLUCOPHAGE) 1000 MG tablet Take 1,000 mg by mouth 2 (two) times daily with a meal.    . Multiple Vitamin (MULTIVITAMIN) tablet Take 1 tablet by mouth daily.    . pentoxifylline (TRENTAL) 400 MG CR tablet TAKE 1 TABLET BY MOUTH  TWICE A DAY 180 tablet 3  . potassium chloride SA (K-DUR,KLOR-CON) 20 MEQ tablet Take 20 mEq by mouth 2 (two) times daily.    Marland Kitchen rOPINIRole (REQUIP) 1 MG tablet Take 1 mg by mouth every evening.     . simvastatin (ZOCOR) 40 MG tablet Take 40 mg by mouth every evening.    . SUPER B COMPLEX/C PO Take 1 tablet by mouth daily.    . nitroGLYCERIN (NITROSTAT) 0.4 MG SL tablet Place 1 tablet (0.4 mg total) under the tongue every 5 (five) minutes as needed for chest pain. 90 tablet 3   No current facility-administered medications for this visit.    REVIEW OF SYSTEMS:   [X]  denotes positive finding, [ ]  denotes negative finding Cardiac  Comments:  Chest pain or chest pressure:    Shortness of breath upon exertion:    Short of breath when lying flat:    Irregular heart rhythm:        Vascular    Pain in calf, thigh, or hip brought on by ambulation: x   Pain in feet at night that wakes you up from your sleep:     Blood clot in your veins:    Leg swelling:         Pulmonary    Oxygen at home:    Productive cough:     Wheezing:         Neurologic    Sudden weakness in arms or legs:     Sudden numbness in arms or legs:     Sudden onset of difficulty speaking or slurred speech:    Temporary loss of vision in one eye:     Problems with dizziness:         Gastrointestinal    Blood in stool:     Vomited blood:         Genitourinary    Burning when urinating:     Blood in urine:  Psychiatric    Major depression:          Hematologic    Bleeding problems:    Problems with blood clotting too easily:        Skin    Rashes or ulcers:        Constitutional    Fever or chills:      PHYSICAL EXAM:      Vitals:   08/04/20 1227  BP: 117/64  Pulse: 68  Resp: 20  Temp: 98 F (36.7 C)  SpO2: 94%  Weight: 220 lb (99.8 kg)  Height: 5' 7.5" (1.715 m)    GENERAL: The patient is a well-nourished male, in no acute distress. The vital signs are documented above. CARDIAC: There is a regular rate and rhythm.  VASCULAR: Nonpalpable pedal pulses PULMONARY: Non-labored respirations ABDOMEN: Soft and non-tender with normal pitched bowel sounds.  MUSCULOSKELETAL: There are no major deformities or cyanosis. NEUROLOGIC: No focal weakness or paresthesias are detected. SKIN: There are no ulcers or rashes noted. PSYCHIATRIC: The patient has a normal affect.  STUDIES:   I have reviewed the following studies: +-------+-----------+-----------+------------+------------+  ABI/TBIToday's ABIToday's TBIPrevious ABIPrevious TBI  +-------+-----------+-----------+------------+------------+  Right 0.70    0.48    0.59    0.38      +-------+-----------+-----------+------------+------------+  Left  0.63    0.37    0.56    0.32      +-------+-----------+-----------+------------+------------+  MEDICAL ISSUES:   Progression with symptoms of bilateral claudication, with limited mobility.  We discussed proceeding with diagnostic angiography to determine what has changed since he was last evaluated.  I will plan on doing this through a right femoral approach.  If there is anything on the left leg that I can intervene on, I would do so at that time.  He is scheduled to get a colonoscopy at the end of August and so we will do this afterwards.    Leia Alf, MD, FACS [facility] [phone] Pager [phone]   No complaints CV:RRR PULM:CTA Neuro intact Plan angio today.  All questionsanswered.  WB

## 2020-09-16 NOTE — Progress Notes (Signed)
Site area- right  Site Prior to Removal- 0   Pressure Applied For-  25 MInutes   Bedrest Beginning at - 0920   Manual- Yes   Patient Status During Pull- Stable    Post Pull Groin Site- 0   Post Pull Instructions Given- Yes   Post Pull Pulses Present- Yes    Dressing Applied- Tegaderm and Gauze Dressing    Comments:  doppler pulses. Patient had mild bruise above stick site with dressing on bruising not visible. Education discussed with patient.

## 2020-09-16 NOTE — Op Note (Signed)
Patient name: Anthony [name] MRN: [id number] DOB: Jan 08, 1946 Sex: male  09/16/2020 Pre-operative Diagnosis: bilateral claudication Post-operative diagnosis:  Same Surgeon:  Annamarie Major Procedure Performed:  1.  Ultrasound-guided access, right femoral artery  2.  Abdominal aortogram  3.  Bilateral lower extremity runoff    Indications: This is a 74 year old gentleman who has previously undergone right femoral endarterectomy with patch angioplasty for severe claudication.  He has done well for several years however has developed progressive symptoms in both legs.  He is here for angiographic evaluation and possible intervention.  Procedure:  The patient was identified in the holding area and taken to room 8.  The patient was then placed supine on the table and prepped and draped in the usual sterile fashion.  A time out was called.  Conscious sedation was administered with the use of IV fentanyl and Versed under continuous physician and nurse monitoring.  Heart rate, blood pressure, and oxygen saturation were continuously monitored.  Total sedation time was 32minutes.  Ultrasound was used to evaluate the right common femoral artery.  It was patent .  A digital ultrasound image was acquired.  A micropuncture needle was used to access the right common femoral artery under ultrasound guidance.  An 018 wire was advanced without resistance and a micropuncture sheath was placed.  The 018 wire was removed and a benson wire was placed.  The micropuncture sheath was exchanged for a 5 french sheath.  An omniflush catheter was advanced over the wire to the level of L-1.  An abdominal angiogram was obtained.  Next, the catheter was pulled down to the aortic bifurcation and oblique image of the pelvis was obtained followed by bilateral runoff. Findings:   Aortogram: No significant renal artery stenosis was identified.  The infrarenal abdominal aorta is patent without stenosis.  The left common iliac has a  exophytic plaque in its midportion which is nearly occlusive.  The hypogastric artery is occluded.  The external iliac artery there is to be calcified but patent without stenosis.  The right common and external iliac arteries remain calcified but patent I did not see any significant stenosis within the common iliac artery.  There does appear to be about a 50% stenosis in the external iliac artery, however there is no pressure gradient across this lesion.  The hypogastric artery is occluded.  Right Lower Extremity: The right common femoral artery is widely patent without stenosis.  The profundofemoral artery is patent without stenosis.  The superficial femoral artery occludes in the midportion of the leg and is heavily calcified.  He appears to reconstitute at the level of the anterior tibial artery.  There is three-vessel runoff.   Left Lower Extremity:  the left common femoral artery is occluded.  There is reconstitution in of the origin of the superficial femoral and profundofemoral artery.  The superficial femoral artery is heavily calcified with subtotal occlusions in the midportion.  There is a near occlusive lesion in the popliteal artery just above the joint space.  The below-knee popliteal artery appears to be widely patent.  There is three-vessel runoff.  Intervention: None  Impression:  #1 approximately 50% stenosis within the right external iliac artery however there is no significant pressure gradient across this lesion.  #2 the right groin intervention remains widely patent.  The superficial femoral artery is occluded with reconstitution at the level of the anterior tibial artery with three-vessel runoff.  #3 occlusion of the left common femoral artery with reconstitution at  the origin of the profunda and superficial femoral artery.  The superficial femoral artery has a long segment subtotal occlusion in the midportion and a stenosis just proximal to the joint space with a patent below-knee  popliteal artery and three-vessel runoff  #4 high-grade stenosis secondary to an exophytic plaque within the left common iliac artery  #5 the patient will be brought back for discussions regarding left femoral endarterectomy and retrograde intervention of the left common iliac artery.   Theotis Burrow, M.D., [facility] [place] Office: [phone] Pager:  [phone]

## 2020-09-18 ENCOUNTER — Other Ambulatory Visit: Payer: Self-pay

## 2020-09-19 ENCOUNTER — Telehealth (HOSPITAL_COMMUNITY): Payer: Self-pay | Admitting: Cardiology

## 2020-09-19 NOTE — Telephone Encounter (Signed)
Anthony [name] with [facility] called to request surgical clearance for upcoming procedure   LEFT FEMORAL ENDARTERECTOMY WITH PATCH ANGIOPLASTY Scheduled 10/2020  Last OV 08/15/2019

## 2020-09-19 NOTE — Telephone Encounter (Signed)
Ok to proceed. 

## 2020-09-23 LAB — POCT I-STAT, CHEM 8
BUN: 58 mg/dL — ABNORMAL HIGH (ref 8–23)
Calcium, Ion: 1.09 mmol/L — ABNORMAL LOW (ref 1.15–1.40)
Chloride: 105 mmol/L (ref 98–111)
Creatinine, Ser: 1.4 mg/dL — ABNORMAL HIGH (ref 0.61–1.24)
Glucose, Bld: 175 mg/dL — ABNORMAL HIGH (ref 70–99)
HCT: 48 % (ref 39.0–52.0)
Hemoglobin: 16.3 g/dL (ref 13.0–17.0)
Potassium: 8.2 mmol/L (ref 3.5–5.1)
Sodium: 134 mmol/L — ABNORMAL LOW (ref 135–145)
TCO2: 25 mmol/L (ref 22–32)

## 2020-10-06 ENCOUNTER — Encounter: Payer: Medicare Other | Admitting: Surgery

## 2020-10-09 ENCOUNTER — Encounter: Payer: Self-pay | Admitting: Orthopaedic Surgery

## 2020-10-09 ENCOUNTER — Ambulatory Visit: Payer: Self-pay

## 2020-10-09 ENCOUNTER — Ambulatory Visit (INDEPENDENT_AMBULATORY_CARE_PROVIDER_SITE_OTHER): Payer: Medicare Other | Admitting: Orthopaedic Surgery

## 2020-10-09 VITALS — Ht 68.5 in | Wt 227.0 lb

## 2020-10-09 DIAGNOSIS — M1712 Unilateral primary osteoarthritis, left knee: Secondary | ICD-10-CM

## 2020-10-09 NOTE — Progress Notes (Signed)
Office Visit Note   Patient: Anthony [name]           Date of Birth: [birth year]           MRN: [id number] Visit Date: 10/09/2020              Requested by: [name], [place],   [id number] PCP: [name], MD   Assessment & Plan: Visit Diagnoses:  1. Primary localized osteoarthritis of left knee     Plan: Impression is advanced generative joint disease left knee.  We will submit to insurance for approval for left knee viscosupplementation injection.  He will follow up with [place] once has been completed. This patient is diagnosed with osteoarthritis of the knee(s).    Radiographs show evidence of joint space narrowing, osteophytes, subchondral sclerosis and/or subchondral cysts.  This patient has knee pain which interferes with functional and activities of daily living.    This patient has experienced inadequate response, adverse effects and/or intolerance with conservative treatments such as acetaminophen, NSAIDS, topical creams, physical therapy or regular exercise, knee bracing and/or weight loss.   This patient has experienced inadequate response or has a contraindication to intra articular steroid injections for at least 3 months.   This patient is not scheduled to have a total knee replacement within 6 months of starting treatment with viscosupplementation.   Follow-Up Instructions: Return for once approved for left knee visco inj.   Orders:  Orders Placed This Encounter  Procedures   XR KNEE 3 VIEW LEFT   No orders of the defined types were placed in this encounter.     Procedures: No procedures performed   Clinical Data: No additional findings.   Subjective: Chief Complaint  Patient presents with   Left Knee - Follow-up    HPI patient is a pleasant 74 year old gentleman who comes in today with recurrent left knee pain.  History of degenerative joint disease.  He was seen in our office in July 2020 where Synvisc injection was  performed.  He had significant relief of symptoms until recently.  His pain has returned and is to the posterior lateral knee.  He describes this as a constant ache with associated instability.  No mechanical symptoms.  Any activity seems to worsen the symptoms.  He uses Voltaren gel with mild relief of symptoms.  He is requesting a repeat viscosupplementation injection today.  Review of Systems as detailed in HPI.  All others reviewed and are negative.   Objective: Vital Signs: Ht 5' 8.5" (1.74 m)    Wt 227 lb (103 kg)    BMI 34.01 kg/m   Physical Exam well-developed well-nourished gentleman in no acute distress.  Alert oriented x3.  Ortho Exam examination of the left knee shows no effusion.  Range of motion 0 to 120 degrees.  Medial and lateral joint line tenderness.  Mild tenderness to popliteal fossa.  Calf is soft nontender.  Ligaments are stable.  He is neurovascular intact distally.  Specialty Comments:  No specialty comments available.  Imaging: XR KNEE 3 VIEW LEFT  Result Date: 10/09/2020 Moderate tricompartmental degenerative changes.    PMFS History: Patient Active Problem List   Diagnosis Date Noted   Unilateral primary osteoarthritis, left knee 07/10/2019   Bleeding external hemorrhoids suspected 06/07/2019   Long term current use of clopidogrel 06/07/2019   Unstable angina (HCC)    Bilateral primary osteoarthritis of knee 10/11/2017   Chronic diastolic heart failure ([place]) 05/06/2015   Femoral artery  stenosis, right ([place]) 10/10/2014   Preoperative clearance 09/09/2014   Lumbar spine scoliosis 05/28/2014   Pain in limb 12/10/2013   Chest tightness 09/27/2013   HTN (hypertension) 09/27/2013   Peripheral vascular disease, unspecified ([place]) 01/08/2013   PAD (peripheral artery disease) ([place]) 11/13/2012   Atherosclerosis of native arteries of the extremities with intermittent claudication 11/13/2012   Bilateral claudication of lower limb ([place])  10/09/2012   Coronary atherosclerosis of native coronary artery 08/26/2012   COPD (chronic obstructive pulmonary disease) ([place]) 08/11/2012   OSA (obstructive sleep apnea) 08/11/2012   Diaphragm paralysis 08/11/2012   Morbid obesity with BMI of 40.0-44.9, adult ([place]) 08/11/2012   Past Medical History:  Diagnosis Date   Arthritis    CAD (coronary artery disease)    stent placed 06/02/2000   Cataract    had surgery   Congestive heart failure (HCC)    COPD (chronic obstructive pulmonary disease) ([place]) 08/11/2012   Diabetes mellitus (HCC)    Hyperlipidemia    Hypertension    Lumbar stenosis    Neuromuscular disorder (HCC)    neuropathy in feet   OSA (obstructive sleep apnea) 08/11/2012   cpcap   Peripheral vascular disease (HCC)    Pneumonia 1999   Rosacea    Shortness of breath    with exertion   Sleep apnea     Family History  Problem Relation Age of Onset   Heart disease Father 1   Cancer Father    Hyperlipidemia Father    Hypertension Father    Heart attack Father    Cancer Mother    Deep vein thrombosis Mother        Varicose veins   Diabetes Mother    Hyperlipidemia Mother    Hypertension Mother     Past Surgical History:  Procedure Laterality Date   ABDOMINAL ANGIOGRAM  Dec. 4, 2013   ABDOMINAL AORTAGRAM N/A 11/29/2012   Procedure: ABDOMINAL Maxcine Ham;  Surgeon: Serafina Mitchell, MD;  Location: [facility] CATH LAB;  Service: Cardiovascular;  Laterality: N/A;   ABDOMINAL AORTAGRAM N/A 09/03/2014   Procedure: ABDOMINAL Maxcine Ham;  Surgeon: Serafina Mitchell, MD;  Location: [facility] CATH LAB;  Service: Cardiovascular;  Laterality: N/A;   ABDOMINAL AORTOGRAM W/LOWER EXTREMITY N/A 09/16/2020   Procedure: ABDOMINAL AORTOGRAM W/LOWER EXTREMITY;  Surgeon: Serafina Mitchell, MD;  Location: [facility] CV LAB;  Service: Cardiovascular;  Laterality: N/A;   ANGIOPLASTY  06/02/2000   Stent   ANTERIOR LATERAL LUMBAR FUSION 4 LEVELS Right 05/28/2014   Procedure:  Right L4-5 L3-4 L2-3, L1-2  Anterior lateral lumbar fusion with percutaneaous pedicle screws. Lumbar four/five, three/four, two/three and possible two/one;  Surgeon: Erline Levine, MD;  Location: [facility];  Service: Neurosurgery;  Laterality: Right;  Lumbar One-Five Fusion with Percutaneous Screws   [place]   right foot   CARDIAC CATHETERIZATION     CARPAL TUNNEL RELEASE  09/30/2006   right wrist   CERVICAL FUSION  Nov 2002   COLONOSCOPY  2017   COLONOSCOPY  08/26/2020   COLONOSCOPY W/ BIOPSIES AND POLYPECTOMY     benign   CORONARY STENT INTERVENTION N/A 05/11/2019   Procedure: CORONARY STENT INTERVENTION;  Surgeon: Burnell Blanks, MD;  Location: [facility] CV LAB;  Service: Cardiovascular;  Laterality: N/A;   ENDARTERECTOMY FEMORAL Right 10/10/2014   Procedure: RIGHT FEMORAL ARTERY ENDARTERECTOMY  WITH VASCU GUARD PATCH ANGIOPLASTY;  Surgeon: Serafina Mitchell, MD;  Location: [facility];  Service: Vascular;  Laterality: Right;  EYE SURGERY Bilateral 2014   cataracts   HAMMER TOE SURGERY  June 2007 & August 2008   left foot   HAND ARTHROPLASTY  Oct 2010   right thumb   Heart catherization  03/26/1999   LUMBAR DISC SURGERY  Dec 1997 & Ampril 2005   LUMBAR PERCUTANEOUS PEDICLE SCREW 4 LEVEL N/A 05/28/2014   Procedure: LUMBAR PERCUTANEOUS PEDICLE SCREW 4 LEVEL;  Surgeon: Erline Levine, MD;  Location: [facility];  Service: Neurosurgery;  Laterality: N/A;   [place]   right foot   PERIPHERAL VASCULAR CATHETERIZATION N/A 12/07/2016   Procedure: Abdominal Aortogram w/Lower Extremity;  Surgeon: Serafina Mitchell, MD;  Location: [facility] LAB;  Service: Cardiovascular;  Laterality: N/A;   RIGHT/LEFT HEART CATH AND CORONARY ANGIOGRAPHY N/A 05/11/2019   Procedure: RIGHT/LEFT HEART CATH AND CORONARY ANGIOGRAPHY;  Surgeon: Jolaine Artist, MD;  Location: [facility];  Service: Cardiovascular;  Laterality: N/A;   SPINE SURGERY   04/21/2004   Lower back disk gurgery- Lumbar stenosis   TONSILLECTOMY  1965   Social History   Occupational History   Occupation: retired  Tobacco Use   Smoking status: Former Smoker    Packs/day: 2.00    Years: 28.00    Pack years: 56.00    Types: Cigarettes    Quit date: 08/03/1987    Years since quitting: 33.2   Smokeless tobacco: Never Used  Vaping Use   Vaping Use: Never used  Substance and Sexual Activity   Alcohol use: Yes    Comment: weekly   Drug use: No   Sexual activity: Not on file

## 2020-10-10 ENCOUNTER — Telehealth: Payer: Self-pay

## 2020-10-10 NOTE — Telephone Encounter (Signed)
Please submit for left knee gel injection 

## 2020-10-10 NOTE — Telephone Encounter (Signed)
Noted  

## 2020-10-20 ENCOUNTER — Telehealth: Payer: Self-pay

## 2020-10-20 NOTE — Telephone Encounter (Signed)
Submitted VOB, SynviscOne, left knee.

## 2020-11-03 ENCOUNTER — Telehealth: Payer: Self-pay

## 2020-11-03 NOTE — Telephone Encounter (Signed)
PA required for SynviscOne, left knee. Faxed completed PA form to [employer] at [phone]. 

## 2020-11-03 NOTE — Progress Notes (Signed)
[id number] [address], [place] [address] Phone: [phone] Fax: [phone]  [place], [address], [address] [place], [place] [address] [place] [address] Phone: [phone] Fax: [place], [place] [phone] [place] [address] Phone: [phone] Fax: [phone]      Your procedure is scheduled on Wednesday November 10th.  Report to [facility] Main Entrance "A" at 6:30 A.M., and check in at the Admitting office.  Call this number if you have problems the morning of surgery:  [phone]  Call [phone] if you have any questions prior to your surgery date Monday-Friday 8am-4pm    Remember:  Do not eat or drink anything after midnight the night before your surgery     Take these medicines the morning of surgery with A SIP OF WATER   ANORO ELLIPTA 62.5-25 MCG/INH AEPB   bisoprolol (ZEBETA) 5 MG tablet  isosorbide mononitrate (IMDUR) 30 MG 24 hr tablet  IF NEEDED  nitroGLYCERIN (NITROSTAT) 0.4 MG SL tablet  As of today, STOP taking any Aspirin (unless otherwise instructed by your surgeon) Aleve, Naproxen, Ibuprofen, Motrin, Advil, Goody's, BC's, all herbal medications, fish oil, and all vitamins.   WHAT DO I DO ABOUT MY DIABETES MEDICATION?   Marland Kitchen Do not take Jardiance the day before surgery or the morning of surgery.  Do not take Metformin on the morning of surgery  .  Marland Kitchen The day of surgery, do not take any diabetes injectables, including Byetta (exenatide), Bydureon (exenatide ER), Victoza (liraglutide), or Trulicity (dulaglutide).  HOW TO MANAGE YOUR DIABETES BEFORE AND AFTER SURGERY  Why is it important to control my blood sugar before and after surgery? . Improving blood sugar levels before and after surgery helps healing and can limit problems. . A  way of improving blood sugar control is eating a healthy diet by: o  Eating less sugar and carbohydrates o  Increasing activity/exercise o  Talking with your doctor about reaching your blood sugar goals . High blood sugars (greater than 180 mg/dL) can raise your risk of infections and slow your recovery, so you will need to focus on controlling your diabetes during the weeks before surgery. . Make sure that the doctor who takes care of your diabetes knows about your planned surgery including the date and location.  How do I manage my blood sugar before surgery? . Check your blood sugar at least 4 times a day, starting 2 days before surgery, to make sure that the level is not too high or low. . Check your blood sugar the morning of your surgery when you wake up and every 2 hours until you get to the Short Stay unit. o If your blood sugar is less than 70 mg/dL, you will need to treat for low blood sugar: - Do not take insulin. - Treat a low blood sugar (less than 70 mg/dL) with  cup of clear juice (cranberry or apple), 4 glucose tablets, OR glucose gel. - Recheck blood sugar in 15 minutes after treatment (to make sure it is greater than 70 mg/dL). If your blood sugar is not greater than 70 mg/dL on recheck, call [phone] for further instructions. . Report your blood sugar to the short stay nurse when you get to Short Stay.  . If you are admitted to the hospital after surgery: o Your blood sugar will be  checked by the staff and you will probably be given insulin after surgery (instead of oral diabetes medicines) to make sure you have good blood sugar levels. o The goal for blood sugar control after surgery is 80-180 mg/dL.                    Do not wear jewelry            Do not wear lotions, powders, colognes, or deodorant.            Do not shave 48 hours prior to surgery.  Men may shave face and neck.            Do not bring valuables to the hospital.            [facility] is not  responsible for any belongings or valuables.  Do NOT Smoke (Tobacco/Vaping) or drink Alcohol 24 hours prior to your procedure If you use a CPAP at night, you may bring all equipment for your overnight stay.   Contacts, glasses, dentures or bridgework may not be worn into surgery.      For patients admitted to the hospital, discharge time will be determined by your treatment team.   Patients discharged the day of surgery will not be allowed to drive home, and someone needs to stay with them for 24 hours.    Special instructions:   [place]- Preparing For Surgery  Before surgery, you can play an important role. Because skin is not sterile, your skin needs to be as free of germs as possible. You can reduce the number of germs on your skin by washing with CHG (chlorahexidine gluconate) Soap before surgery.  CHG is an antiseptic cleaner which kills germs and bonds with the skin to continue killing germs even after washing.    Oral Hygiene is also important to reduce your risk of infection.  Remember - BRUSH YOUR TEETH THE MORNING OF SURGERY WITH YOUR REGULAR TOOTHPASTE  Please do not use if you have an allergy to CHG or antibacterial soaps. If your skin becomes reddened/irritated stop using the CHG.  Do not shave (including legs and underarms) for at least 48 hours prior to first CHG shower. It is OK to shave your face.  Please follow these instructions carefully.   1. Shower the NIGHT BEFORE SURGERY and the MORNING OF SURGERY with CHG Soap.   2. If you chose to wash your hair, wash your hair first as usual with your normal shampoo.  3. After you shampoo, rinse your hair and body thoroughly to remove the shampoo.  4. Use CHG as you would any other liquid soap. You can apply CHG directly to the skin and wash gently with a scrungie or a clean washcloth.   5. Apply the CHG Soap to your body ONLY FROM THE NECK DOWN.  Do not use on open wounds or open sores. Avoid contact with your eyes,  ears, mouth and genitals (private parts). Wash Face and genitals (private parts)  with your normal soap.   6. Wash thoroughly, paying special attention to the area where your surgery will be performed.  7. Thoroughly rinse your body with warm water from the neck down.  8. DO NOT shower/wash with your normal soap after using and rinsing off the CHG Soap.  9. Pat yourself dry with a CLEAN TOWEL.  10. Wear CLEAN PAJAMAS to bed the night before surgery  11. Place CLEAN SHEETS on your bed the night  of your first shower and DO NOT SLEEP WITH PETS.   Day of Surgery: Wear Clean/Comfortable clothing the morning of surgery Do not apply any deodorants/lotions.   Remember to brush your teeth WITH YOUR REGULAR TOOTHPASTE.   Please read over the following fact sheets that you were given.

## 2020-11-04 ENCOUNTER — Encounter (HOSPITAL_COMMUNITY): Payer: Self-pay

## 2020-11-04 ENCOUNTER — Other Ambulatory Visit (HOSPITAL_COMMUNITY): Payer: Medicare Other

## 2020-11-04 ENCOUNTER — Other Ambulatory Visit: Payer: Self-pay

## 2020-11-04 ENCOUNTER — Other Ambulatory Visit (HOSPITAL_COMMUNITY)
Admission: RE | Admit: 2020-11-04 | Discharge: 2020-11-04 | Disposition: A | Discharge status: Home / Self Care | Payer: Medicare Other | Source: Ambulatory Visit | Attending: Surgery | Admitting: Surgery

## 2020-11-04 ENCOUNTER — Encounter (HOSPITAL_COMMUNITY)
Admission: RE | Admit: 2020-11-04 | Discharge: 2020-11-04 | Disposition: A | Discharge status: Home / Self Care | Payer: Medicare Other | Source: Ambulatory Visit | Attending: Surgery | Admitting: Surgery

## 2020-11-04 DIAGNOSIS — Z01812 Encounter for preprocedural laboratory examination: Secondary | ICD-10-CM | POA: Insufficient documentation

## 2020-11-04 DIAGNOSIS — Z20822 Contact with and (suspected) exposure to covid-19: Secondary | ICD-10-CM | POA: Insufficient documentation

## 2020-11-04 LAB — COMPREHENSIVE METABOLIC PANEL
ALT: 22 U/L (ref 0–44)
AST: 26 U/L (ref 15–41)
Albumin: 3.7 g/dL (ref 3.5–5.0)
Alkaline Phosphatase: 63 U/L (ref 38–126)
Anion gap: 10 (ref 5–15)
BUN: 29 mg/dL — ABNORMAL HIGH (ref 8–23)
CO2: 24 mmol/L (ref 22–32)
Calcium: 9.4 mg/dL (ref 8.9–10.3)
Chloride: 104 mmol/L (ref 98–111)
Creatinine, Ser: 1.58 mg/dL — ABNORMAL HIGH (ref 0.61–1.24)
GFR, Estimated: 45 mL/min — ABNORMAL LOW (ref >60)
Glucose, Bld: 161 mg/dL — ABNORMAL HIGH (ref 70–99)
Potassium: 4.6 mmol/L (ref 3.5–5.1)
Sodium: 138 mmol/L (ref 135–145)
Total Bilirubin: 1 mg/dL (ref 0.3–1.2)
Total Protein: 6.9 g/dL (ref 6.5–8.1)

## 2020-11-04 LAB — CBC
HCT: 47.3 % (ref 39.0–52.0)
Hemoglobin: 15.1 g/dL (ref 13.0–17.0)
MCH: 30.2 pg (ref 26.0–34.0)
MCHC: 31.9 g/dL (ref 30.0–36.0)
MCV: 94.6 fL (ref 80.0–100.0)
Platelets: 128 10*3/uL — ABNORMAL LOW (ref 150–400)
RBC: 5 MIL/uL (ref 4.22–5.81)
RDW: 12.9 % (ref 11.5–15.5)
WBC: 5.7 10*3/uL (ref 4.0–10.5)
nRBC: 0 % (ref 0.0–0.2)

## 2020-11-04 LAB — URINALYSIS, ROUTINE W REFLEX MICROSCOPIC
Bacteria, UA: NONE SEEN
Bilirubin Urine: NEGATIVE
Glucose, UA: >=500 mg/dL — AB
Hgb urine dipstick: NEGATIVE
Ketones, ur: NEGATIVE mg/dL
Leukocytes,Ua: NEGATIVE
Nitrite: NEGATIVE
Protein, ur: NEGATIVE mg/dL
Specific Gravity, Urine: 1.022 (ref 1.005–1.030)
pH: 5 (ref 5.0–8.0)

## 2020-11-04 LAB — PROTIME-INR
INR: 1.1 (ref 0.8–1.2)
Prothrombin Time: 13.6 seconds (ref 11.4–15.2)

## 2020-11-04 LAB — SARS CORONAVIRUS 2 (TAT 6-24 HRS): SARS Coronavirus 2: NEGATIVE

## 2020-11-04 LAB — TYPE AND SCREEN
ABO/RH(D): O POS
Antibody Screen: NEGATIVE

## 2020-11-04 LAB — APTT: aPTT: 30 seconds (ref 24–36)

## 2020-11-04 LAB — SURGICAL PCR SCREEN
MRSA, PCR: NEGATIVE
Staphylococcus aureus: NEGATIVE

## 2020-11-04 LAB — GLUCOSE, CAPILLARY: Glucose-Capillary: 175 mg/dL — ABNORMAL HIGH (ref 70–99)

## 2020-11-04 NOTE — Progress Notes (Signed)
Anesthesia Chart Review:  Case: [id number] Date/Time: 11/05/20 0815   Procedures:      LEFT FEMORAL ENDARTERECTOMY WITH PATCH ANGIOPLASTY (Left )     INSERTION OF ILIAC STENT (Left )   Anesthesia type: General   Pre-op diagnosis: LEFT FEMORAL ARTERY OCCLUSION, ILIAC ARTERY STENOSIS   Location: [facility] OR ROOM 16 / [place] [facility]   Surgeons: Serafina Mitchell, MD      DISCUSSION: Patient is a 74 year old male scheduled for the above procedure.   History includes former smoker (quit 08/03/87), CAD (s/p stent '01 in [place]; DES pLAD 05/11/19), CHF, DM2, HLD, COPD (required O2 ~ 2013; off supplemental O2 "for years"), right hemidiaphragm paralysis, PVD/claudication (s/p right femoral endarterectomy 10/10/14), OSA (CPAP), exertional dyspnea, neuropathy (feet), rosacea, cervical fusion (2002), lumbar surgery (2005; L1-5 anterolateral fusion 05/28/14). Labs suggest underlying CKD. BMI is consistent with obesity. Cardiology notes indicate that he is the father-in-law of radiologist [name], MD.   Telephone encounter by Dr. [name] on 09/19/20 (in regards to left femoral endarterectomy) states, "Ok to proceed".  Per [facility], patient to continue ASA and hold Trental for 5 days prior to surgery.   11/04/20 presurgical COVID-19 test in process. Anesthesia team to evaluate on the day of surgery.    VS: BP (!) 138/59   Pulse 69   Temp 36.6 C (Oral)   Resp 18   Ht 5\' 9"  (1.753 m)   Wt 104.6 kg   SpO2 96%   BMI 34.04 kg/m    PROVIDERS: [name], MD is PCP  Glori Bickers, MD is HF cardiologist [name], MD is pulmonologist. Last visit 07/14/20. COPD noted to be improved since switching from Spiriva to Anoro. Compliant with CPAP. 1 year follow-up.   LABS: Labs reviewed: Acceptable for surgery. Cr 1.58, previously 1.26-1.40 since 05/2020. (all labs ordered are listed, but only abnormal results are displayed)  Labs Reviewed  GLUCOSE, CAPILLARY - Abnormal; Notable for the following components:      Result  Value   Glucose-Capillary 175 (*)    All other components within normal limits  CBC - Abnormal; Notable for the following components:   Platelets 128 (*)    All other components within normal limits  COMPREHENSIVE METABOLIC PANEL - Abnormal; Notable for the following components:   Glucose, Bld 161 (*)    BUN 29 (*)    Creatinine, Ser 1.58 (*)    GFR, Estimated 45 (*)    All other components within normal limits  URINALYSIS, ROUTINE W REFLEX MICROSCOPIC - Abnormal; Notable for the following components:   Glucose, UA >=500 (*)    All other components within normal limits  SURGICAL PCR SCREEN  APTT  PROTIME-INR  TYPE AND SCREEN     PFT 11/28/19 >> FEV1 2.30 (78%), FEV1% 75, TLC 5.84 (85%), DLCO 70%, no BD   EKG: 09/16/20: Sinus rhythm with 1st degree A-V block Inferior infarct , age undetermined Anterior infarct , age undetermined Abnormal ECG No significant change since last tracing Confirmed by Oswaldo Milian [phone]) on 09/16/2020 10:15:27 PM   CV: Echo 08/15/19: IMPRESSIONS  1. The left ventricle has normal systolic function, with an ejection  fraction of 55-60%. The cavity size was normal. There is mild concentric  left ventricular hypertrophy. Left ventricular diastolic Doppler  parameters are consistent with impaired  relaxation. No evidence of left ventricular regional wall motion  abnormalities.  2. The right ventricle has normal systolic function. The cavity was  normal. There is no increase  in right ventricular wall thickness. Right  ventricular systolic pressure is normal with an estimated pressure of 20.8  mmHg.  3. The mitral valve is grossly normal. Mild thickening of the mitral  valve leaflet. There is mild mitral annular calcification present.  4. The tricuspid valve is grossly normal.  5. The aortic valve is tricuspid. No stenosis of the aortic valve.  6. The aorta is normal unless otherwise noted.  7. The ascending aorta is normal in size  and structure.  8. When compared to the prior study: EF improved to 55% since last study  (05/13/2015).    Cardiac cath 05/11/19:  Prox RCA lesion is 100% stenosed.  Prox LAD lesion is 90% stenosed.  Mid LAD lesion is 100% stenosed.   Findings:  Ao = 89/49 (65) LV = 88/6 RA = 4  RV = 27/5 PA = 28/4 (15) PCW = 13 Fick cardiac output/index = 4.9/2.2 PVR = < 1.0 WU Ao sat = 98% PA sat = 69%, 70%  Assessment: 1. Left dominant coronary system with 2V CAD 2. Small dominant RCA with chronic proximal total occlusion 3. Widely patent dominant LCX 4. High grade 95% proximal LAD likely within or just prior to previous stent. Probable chronic occlusion of distal LAD but not seen well 5. LVEF 60-65% with normal filling pressures 6. Normal right heart pressures   PCI 05/11/19: 1. Severe stenosis proximal LAD with chronic occlusion of the distal LAD. High grade stenosis in the distal Diagonal branch which arises just before the occluded LAD.  2. Successful angioplasty of the Diagonal branch (balloon only) 3. Successful PTCA/DES x 1 proximal LAD  Recommendations: DAPT with ASA and Plavix for at least 6 months but longer if well tolerated.    Carotid US 09/11/14: Bilateral ICA velocities suggest < 40% stenosis   Past Medical History:  Diagnosis Date  . Arthritis   . CAD (coronary artery disease)    stent placed 06/02/2000  . Cataract    had surgery  . Congestive heart failure ([place])   . COPD (chronic obstructive pulmonary disease) ([place]) 08/11/2012  . Diabetes mellitus ([place])   . Hyperlipidemia   . Hypertension   . Lumbar stenosis   . Neuromuscular disorder (H[facility])    neuropathy in feet  . OSA (obstructive sleep apnea) 08/11/2012   cpcap  . Peripheral vascular disease ([place])   . Pneumonia 1999  . Rosacea   . Shortness of breath    with exertion  . Sleep apnea     Past Surgical History:  Procedure Laterality Date  . ABDOMINAL ANGIOGRAM  Dec. 4, 2013  . ABDOMINAL  AORTAGRAM N/A 11/29/2012   Procedure: ABDOMINAL Maxcine Ham;  Surgeon: Serafina Mitchell, MD;  Location: [facility] CATH LAB;  Service: Cardiovascular;  Laterality: N/A;  . ABDOMINAL AORTAGRAM N/A 09/03/2014   Procedure: ABDOMINAL AORTAGRAM;  Surgeon: Serafina Mitchell, MD;  Location: [facility] CATH LAB;  Service: Cardiovascular;  Laterality: N/A;  . ABDOMINAL AORTOGRAM W/LOWER EXTREMITY N/A 09/16/2020   Procedure: ABDOMINAL AORTOGRAM W/LOWER EXTREMITY;  Surgeon: Serafina Mitchell, MD;  Location: [place] CV LAB;  Service: Cardiovascular;  Laterality: N/A;  . ANGIOPLASTY  06/02/2000   Stent  . ANTERIOR LATERAL LUMBAR FUSION 4 LEVELS Right 05/28/2014   Procedure: Right L4-5 L3-4 L2-3, L1-2  Anterior lateral lumbar fusion with percutaneaous pedicle screws. Lumbar four/five, three/four, two/three and possible two/one;  Surgeon: Erline Levine, MD;  Location: [facility] NEURO ORS;  Service: Neurosurgery;  Laterality: Right;  Lumbar One-Five Fusion with  Percutaneous Screws  . BACK SURGERY    . [place]   right foot  . CARDIAC CATHETERIZATION    . CARPAL TUNNEL RELEASE  09/30/2006   right wrist  . CERVICAL FUSION  Nov 2002  . COLONOSCOPY  2017  . COLONOSCOPY  08/26/2020  . COLONOSCOPY W/ BIOPSIES AND POLYPECTOMY     benign  . CORONARY ANGIOPLASTY    . CORONARY STENT INTERVENTION N/A 05/11/2019   Procedure: CORONARY STENT INTERVENTION;  Surgeon: Burnell Blanks, MD;  Location: [place] CV LAB;  Service: Cardiovascular;  Laterality: N/A;  . ENDARTERECTOMY FEMORAL Right 10/10/2014   Procedure: RIGHT FEMORAL ARTERY ENDARTERECTOMY  WITH VASCU GUARD PATCH ANGIOPLASTY;  Surgeon: Serafina Mitchell, MD;  Location: [facility];  Service: Vascular;  Laterality: Right;  . EYE SURGERY Bilateral 2014   cataracts  . HAMMER TOE SURGERY  June 2007 & August 2008   left foot  . HAND ARTHROPLASTY  Oct 2010   right thumb  . Heart catherization  03/26/1999  . [place] SURGERY  Dec 1997 & Ampril 2005  . LUMBAR PERCUTANEOUS  PEDICLE SCREW 4 LEVEL N/A 05/28/2014   Procedure: LUMBAR PERCUTANEOUS PEDICLE SCREW 4 LEVEL;  Surgeon: Erline Levine, MD;  Location: [facility] NEURO ORS;  Service: Neurosurgery;  Laterality: N/A;  . [place]   right foot  . PERIPHERAL VASCULAR CATHETERIZATION N/A 12/07/2016   Procedure: Abdominal Aortogram w/Lower Extremity;  Surgeon: Serafina Mitchell, MD;  Location: [place] CV LAB;  Service: Cardiovascular;  Laterality: N/A;  . RIGHT/LEFT HEART CATH AND CORONARY ANGIOGRAPHY N/A 05/11/2019   Procedure: RIGHT/LEFT HEART CATH AND CORONARY ANGIOGRAPHY;  Surgeon: Jolaine Artist, MD;  Location: [place] CV LAB;  Service: Cardiovascular;  Laterality: N/A;  . SPINE SURGERY  04/21/2004   Lower back disk gurgery- Lumbar stenosis  . TONSILLECTOMY  1965    MEDICATIONS: . carboxymethylcellulose (REFRESH PLUS) 0.5 % SOLN  . carboxymethylcellulose 1 % ophthalmic solution  . albuterol (PROAIR HFA) 108 (90 Base) MCG/ACT inhaler  . ANORO ELLIPTA 62.5-25 MCG/INH AEPB  . ASPIRIN LOW DOSE 81 MG EC tablet  . BENFOTIAMINE PO  . bisoprolol (ZEBETA) 5 MG tablet  . CINNAMON PO  . diclofenac sodium (VOLTAREN) 1 % GEL  . doxazosin (CARDURA) 2 MG tablet  . doxycycline (VIBRAMYCIN) 50 MG capsule  . empagliflozin (JARDIANCE) 10 MG TABS tablet  . fish oil-omega-3 fatty acids 1000 MG capsule  . fluticasone (FLONASE) 50 MCG/ACT nasal spray  . furosemide (LASIX) 20 MG tablet  . GARLIC PO  . ibuprofen (ADVIL) 200 MG tablet  . isosorbide mononitrate (IMDUR) 30 MG 24 hr tablet  . L-Lysine 1000 MG TABS  . losartan (COZAAR) 100 MG tablet  . metFORMIN (GLUCOPHAGE) 1000 MG tablet  . Multiple Vitamin (MULTIVITAMIN) tablet  . nitroGLYCERIN (NITROSTAT) 0.4 MG SL tablet  . pentoxifylline (TRENTAL) 400 MG CR tablet  . potassium chloride SA (K-DUR,KLOR-CON) 20 MEQ tablet  . rOPINIRole (REQUIP) 1 MG tablet  . Semaglutide (OZEMPIC, 1 MG/DOSE, [place])  . simvastatin (ZOCOR) 40 MG tablet  . SUPER B COMPLEX/C PO    No current facility-administered medications for this encounter.   By medication list, he is not currently taking ProAir MDI, ibuprofen.   Myra Gianotti, PA-C Surgical Short Stay/Anesthesiology [facility] Phone [phone] [facility] Phone [phone] 11/04/2020 6:30 PM

## 2020-11-04 NOTE — Progress Notes (Signed)
PCP - Dr. [name]  Cardiologist - Dr. [name]  Chest x-ray - 10/22/19 EKG - 09/16/20 Stress Test - Yes but unsure of date ECHO - 08/15/19 Cardiac Cath - 05/11/19  Sleep Study - Yes has OSA CPAP - Nightly use  DM - Type II Average fasting 162 Fasting Blood Sugar - 138 CBG at PAT appt 175  Checks Blood Sugar __Daily___   Aspirin Instructions: Continue through day of surgery   COVID TEST- 11/04/20   Anesthesia review:  Yes cardiac history   Patient denies shortness of breath, fever, cough and chest pain at PAT appointment   All instructions explained to the patient, with a verbal understanding of the material. Patient agrees to go over the instructions while at home for a better understanding. Patient also instructed to self quarantine after being tested for COVID-19. The opportunity to ask questions was provided.

## 2020-11-04 NOTE — Anesthesia Preprocedure Evaluation (Addendum)
Anesthesia Evaluation  Patient identified by MRN, date of birth, ID band Patient awake    Reviewed: Allergy & Precautions, NPO status , Patient's Chart, lab work & pertinent test results  History of Anesthesia Complications Negative for: history of anesthetic complications  Airway Mallampati: I  TM Distance: >3 FB Neck ROM: Full    Dental  (+) Dental Advisory Given   Pulmonary sleep apnea and Continuous Positive Airway Pressure Ventilation , COPD,  oxygen dependent, former smoker,  11/04/2020 SARS coronavirus NEG   breath sounds clear to auscultation       Cardiovascular hypertension, Pt. on medications and Pt. on home beta blockers (-) angina+ CAD, + Cardiac Stents and + Peripheral Vascular Disease   Rhythm:Regular Rate:Normal  '20 ECHO: EF 55-60%, mild LVH   Neuro/Psych Chronic back pain    GI/Hepatic negative GI ROS, Neg liver ROS,   Endo/Other  diabetes (glu 190), Oral Hypoglycemic AgentsMorbid obesity  Renal/GU negative Renal ROS     Musculoskeletal  (+) Arthritis ,   Abdominal (+) + obese,   Peds  Hematology   Anesthesia Other Findings   Reproductive/Obstetrics                          Anesthesia Physical Anesthesia Plan  ASA: III  Anesthesia Plan: General   Post-op Pain Management:    Induction: Intravenous  PONV Risk Score and Plan: 2 and Ondansetron and Dexamethasone  Airway Management Planned: Oral ETT  Additional Equipment: None  Intra-op Plan:   Post-operative Plan: Extubation in OR  Informed Consent: I have reviewed the patients History and Physical, chart, labs and discussed the procedure including the risks, benefits and alternatives for the proposed anesthesia with the patient or authorized representative who has indicated his/her understanding and acceptance.     Dental advisory given  Plan Discussed with: CRNA and Surgeon  Anesthesia Plan Comments: (PAT  note written 11/04/2020 by Myra Gianotti, PA-C. )      Anesthesia Quick Evaluation

## 2020-11-05 ENCOUNTER — Encounter (HOSPITAL_COMMUNITY)
Admission: RE | Disposition: A | Discharge status: Home / Self Care | Payer: Self-pay | Source: Home / Self Care | Attending: Surgery

## 2020-11-05 ENCOUNTER — Encounter (HOSPITAL_COMMUNITY): Payer: Self-pay | Admitting: Surgery

## 2020-11-05 ENCOUNTER — Inpatient Hospital Stay (HOSPITAL_COMMUNITY): Payer: Medicare Other | Admitting: Anesthesiology

## 2020-11-05 ENCOUNTER — Inpatient Hospital Stay (HOSPITAL_COMMUNITY)
Admission: RE | Admit: 2020-11-05 | Discharge: 2020-11-06 | DRG: 253 | Disposition: A | Discharge status: Home / Self Care | Payer: Medicare Other | Attending: Surgery | Admitting: Surgery

## 2020-11-05 ENCOUNTER — Inpatient Hospital Stay (HOSPITAL_COMMUNITY): Payer: Medicare Other

## 2020-11-05 DIAGNOSIS — J449 Chronic obstructive pulmonary disease, unspecified: Secondary | ICD-10-CM | POA: Diagnosis present

## 2020-11-05 DIAGNOSIS — Z20822 Contact with and (suspected) exposure to covid-19: Secondary | ICD-10-CM | POA: Diagnosis present

## 2020-11-05 DIAGNOSIS — Z833 Family history of diabetes mellitus: Secondary | ICD-10-CM

## 2020-11-05 DIAGNOSIS — Z8249 Family history of ischemic heart disease and other diseases of the circulatory system: Secondary | ICD-10-CM | POA: Diagnosis not present

## 2020-11-05 DIAGNOSIS — I70202 Unspecified atherosclerosis of native arteries of extremities, left leg: Secondary | ICD-10-CM | POA: Diagnosis present

## 2020-11-05 DIAGNOSIS — I110 Hypertensive heart disease with heart failure: Secondary | ICD-10-CM | POA: Diagnosis present

## 2020-11-05 DIAGNOSIS — I739 Peripheral vascular disease, unspecified: Secondary | ICD-10-CM | POA: Diagnosis present

## 2020-11-05 DIAGNOSIS — Z87891 Personal history of nicotine dependence: Secondary | ICD-10-CM | POA: Diagnosis not present

## 2020-11-05 DIAGNOSIS — Z7982 Long term (current) use of aspirin: Secondary | ICD-10-CM

## 2020-11-05 DIAGNOSIS — Z7984 Long term (current) use of oral hypoglycemic drugs: Secondary | ICD-10-CM | POA: Diagnosis not present

## 2020-11-05 DIAGNOSIS — I5032 Chronic diastolic (congestive) heart failure: Secondary | ICD-10-CM | POA: Diagnosis present

## 2020-11-05 DIAGNOSIS — E1151 Type 2 diabetes mellitus with diabetic peripheral angiopathy without gangrene: Secondary | ICD-10-CM | POA: Diagnosis present

## 2020-11-05 DIAGNOSIS — E7800 Pure hypercholesterolemia, unspecified: Secondary | ICD-10-CM | POA: Diagnosis present

## 2020-11-05 DIAGNOSIS — E785 Hyperlipidemia, unspecified: Secondary | ICD-10-CM | POA: Diagnosis present

## 2020-11-05 DIAGNOSIS — Z83438 Family history of other disorder of lipoprotein metabolism and other lipidemia: Secondary | ICD-10-CM

## 2020-11-05 DIAGNOSIS — I70212 Atherosclerosis of native arteries of extremities with intermittent claudication, left leg: Secondary | ICD-10-CM | POA: Diagnosis not present

## 2020-11-05 DIAGNOSIS — Z79899 Other long term (current) drug therapy: Secondary | ICD-10-CM | POA: Diagnosis not present

## 2020-11-05 DIAGNOSIS — G4733 Obstructive sleep apnea (adult) (pediatric): Secondary | ICD-10-CM | POA: Diagnosis present

## 2020-11-05 HISTORY — PX: FEMORAL ENDARTERECTOMY: SUR606

## 2020-11-05 HISTORY — PX: ENDARTERECTOMY FEMORAL: SHX5804

## 2020-11-05 HISTORY — PX: INSERTION OF ILIAC STENT: SHX6256

## 2020-11-05 HISTORY — PX: PERIPHERAL INTRAVASCULAR LITHOTRIPSY: CATH118324

## 2020-11-05 LAB — CBC
HCT: 43.8 % (ref 39.0–52.0)
Hemoglobin: 14.6 g/dL (ref 13.0–17.0)
MCH: 31.6 pg (ref 26.0–34.0)
MCHC: 33.3 g/dL (ref 30.0–36.0)
MCV: 94.8 fL (ref 80.0–100.0)
Platelets: 124 10*3/uL — ABNORMAL LOW (ref 150–400)
RBC: 4.62 MIL/uL (ref 4.22–5.81)
RDW: 13 % (ref 11.5–15.5)
WBC: 6.4 10*3/uL (ref 4.0–10.5)
nRBC: 0 % (ref 0.0–0.2)

## 2020-11-05 LAB — GLUCOSE, CAPILLARY
Glucose-Capillary: 190 mg/dL — ABNORMAL HIGH (ref 70–99)
Glucose-Capillary: 204 mg/dL — ABNORMAL HIGH (ref 70–99)
Glucose-Capillary: 210 mg/dL — ABNORMAL HIGH (ref 70–99)
Glucose-Capillary: 259 mg/dL — ABNORMAL HIGH (ref 70–99)

## 2020-11-05 LAB — POCT ACTIVATED CLOTTING TIME
Activated Clotting Time: 219 seconds
Activated Clotting Time: 241 seconds
Activated Clotting Time: 213 seconds

## 2020-11-05 LAB — CREATININE, SERUM
Creatinine, Ser: 1.31 mg/dL — ABNORMAL HIGH (ref 0.61–1.24)
GFR, Estimated: 56 mL/min — ABNORMAL LOW (ref >60)

## 2020-11-05 SURGERY — ENDARTERECTOMY, FEMORAL
Anesthesia: General | Site: Groin | Laterality: Left

## 2020-11-05 MED ORDER — FLEET ENEMA 7-19 GM/118ML RE ENEM: 1.0000 | ENEMA | Freq: Once | RECTAL | Status: DC | PRN

## 2020-11-05 MED ORDER — HEPARIN SODIUM (PORCINE) 1000 UNIT/ML IJ SOLN
INTRAMUSCULAR | Status: DC | PRN
  Administered 2020-11-05 (×2): 2000 [IU] via INTRAVENOUS
  Administered 2020-11-05: 10000 [IU] via INTRAVENOUS

## 2020-11-05 MED ORDER — MIDAZOLAM HCL 2 MG/2ML IJ SOLN: 0.5000 mg | Freq: Once | INTRAMUSCULAR | Status: DC | PRN

## 2020-11-05 MED ORDER — IODIXANOL 320 MG/ML IV SOLN
INTRAVENOUS | Status: DC | PRN
  Administered 2020-11-05: 30 mL via INTRA_ARTERIAL

## 2020-11-05 MED ORDER — ONDANSETRON HCL 4 MG/2ML IJ SOLN
INTRAMUSCULAR | Status: DC | PRN
  Administered 2020-11-05: 4 mg via INTRAVENOUS

## 2020-11-05 MED ORDER — ROCURONIUM BROMIDE 10 MG/ML (PF) SYRINGE
PREFILLED_SYRINGE | INTRAVENOUS | Status: AC
  Filled 2020-11-05: qty 10

## 2020-11-05 MED ORDER — ALUM & MAG HYDROXIDE-SIMETH 200-200-20 MG/5ML PO SUSP: 15.0000 mL | ORAL | Status: DC | PRN

## 2020-11-05 MED ORDER — ONDANSETRON HCL 4 MG/2ML IJ SOLN
INTRAMUSCULAR | Status: AC
  Filled 2020-11-05: qty 2

## 2020-11-05 MED ORDER — SODIUM CHLORIDE 0.9 % IV SOLN
INTRAVENOUS | Status: AC
  Filled 2020-11-05: qty 1.2

## 2020-11-05 MED ORDER — FLUTICASONE PROPIONATE 50 MCG/ACT NA SUSP: 2.0000 | Freq: Every evening | NASAL | Status: DC | PRN

## 2020-11-05 MED ORDER — DOCUSATE SODIUM 100 MG PO CAPS
100.0000 mg | ORAL_CAPSULE | Freq: Every day | ORAL | Status: DC
  Administered 2020-11-06: 100 mg via ORAL
  Filled 2020-11-05: qty 1

## 2020-11-05 MED ORDER — LABETALOL HCL 5 MG/ML IV SOLN: 10.0000 mg | INTRAVENOUS | Status: DC | PRN

## 2020-11-05 MED ORDER — PENTOXIFYLLINE ER 400 MG PO TBCR
400.0000 mg | EXTENDED_RELEASE_TABLET | Freq: Two times a day (BID) | ORAL | Status: DC
  Administered 2020-11-05 – 2020-11-06 (×2): 400 mg via ORAL
  Filled 2020-11-05 (×2): qty 1

## 2020-11-05 MED ORDER — ROCURONIUM BROMIDE 10 MG/ML (PF) SYRINGE
PREFILLED_SYRINGE | INTRAVENOUS | Status: DC | PRN
  Administered 2020-11-05 (×2): 20 mg via INTRAVENOUS
  Administered 2020-11-05: 70 mg via INTRAVENOUS

## 2020-11-05 MED ORDER — PHENYLEPHRINE 40 MCG/ML (10ML) SYRINGE FOR IV PUSH (FOR BLOOD PRESSURE SUPPORT)
PREFILLED_SYRINGE | INTRAVENOUS | Status: DC | PRN
  Administered 2020-11-05 (×2): 40 ug via INTRAVENOUS

## 2020-11-05 MED ORDER — ASPIRIN EC 81 MG PO TBEC
81.0000 mg | DELAYED_RELEASE_TABLET | Freq: Every evening | ORAL | Status: DC
  Administered 2020-11-05: 81 mg via ORAL
  Filled 2020-11-05: qty 1

## 2020-11-05 MED ORDER — CARBOXYMETHYLCELLULOSE SODIUM 0.5 % OP SOLN: 1.0000 [drp] | Freq: Three times a day (TID) | OPHTHALMIC | Status: DC | PRN

## 2020-11-05 MED ORDER — EPHEDRINE 5 MG/ML INJ
INTRAVENOUS | Status: AC
  Filled 2020-11-05: qty 10

## 2020-11-05 MED ORDER — LIDOCAINE 2% (20 MG/ML) 5 ML SYRINGE
INTRAMUSCULAR | Status: AC
  Filled 2020-11-05: qty 5

## 2020-11-05 MED ORDER — FENTANYL CITRATE (PF) 250 MCG/5ML IJ SOLN
INTRAMUSCULAR | Status: AC
  Filled 2020-11-05: qty 5

## 2020-11-05 MED ORDER — LACTATED RINGERS IV SOLN: INTRAVENOUS | Status: DC | PRN

## 2020-11-05 MED ORDER — OXYCODONE-ACETAMINOPHEN 5-325 MG PO TABS: 1.0000 | ORAL_TABLET | ORAL | Status: DC | PRN

## 2020-11-05 MED ORDER — CHLORHEXIDINE GLUCONATE CLOTH 2 % EX PADS: 6.0000 | MEDICATED_PAD | Freq: Once | CUTANEOUS | Status: DC

## 2020-11-05 MED ORDER — HEPARIN SODIUM (PORCINE) 1000 UNIT/ML IJ SOLN
INTRAMUSCULAR | Status: AC
  Filled 2020-11-05: qty 1

## 2020-11-05 MED ORDER — DEXAMETHASONE SODIUM PHOSPHATE 10 MG/ML IJ SOLN
INTRAMUSCULAR | Status: AC
  Filled 2020-11-05: qty 1

## 2020-11-05 MED ORDER — HEPARIN SODIUM (PORCINE) 5000 UNIT/ML IJ SOLN
5000.0000 [IU] | Freq: Three times a day (TID) | INTRAMUSCULAR | Status: DC
  Administered 2020-11-05 – 2020-11-06 (×2): 5000 [IU] via SUBCUTANEOUS
  Filled 2020-11-05 (×2): qty 1

## 2020-11-05 MED ORDER — CEFAZOLIN SODIUM-DEXTROSE 2-4 GM/100ML-% IV SOLN
2.0000 g | INTRAVENOUS | Status: AC
  Administered 2020-11-05: 2 g via INTRAVENOUS
  Filled 2020-11-05: qty 100

## 2020-11-05 MED ORDER — METOPROLOL TARTRATE 5 MG/5ML IV SOLN: 2.0000 mg | INTRAVENOUS | Status: DC | PRN

## 2020-11-05 MED ORDER — BISOPROLOL FUMARATE 5 MG PO TABS
5.0000 mg | ORAL_TABLET | Freq: Every day | ORAL | Status: DC
  Administered 2020-11-05 – 2020-11-06 (×2): 5 mg via ORAL
  Filled 2020-11-05 (×2): qty 1

## 2020-11-05 MED ORDER — LACTATED RINGERS IV SOLN: INTRAVENOUS | Status: DC

## 2020-11-05 MED ORDER — ACETAMINOPHEN 325 MG PO TABS: 325.0000 mg | ORAL_TABLET | ORAL | Status: DC | PRN

## 2020-11-05 MED ORDER — PHENYLEPHRINE HCL-NACL 10-0.9 MG/250ML-% IV SOLN
INTRAVENOUS | Status: DC | PRN
  Administered 2020-11-05 (×2): 50 ug/min via INTRAVENOUS

## 2020-11-05 MED ORDER — 0.9 % SODIUM CHLORIDE (POUR BTL) OPTIME
TOPICAL | Status: DC | PRN
  Administered 2020-11-05 (×2): 1000 mL

## 2020-11-05 MED ORDER — HYDROMORPHONE HCL 1 MG/ML IJ SOLN: 0.5000 mg | INTRAMUSCULAR | Status: DC | PRN

## 2020-11-05 MED ORDER — SUGAMMADEX SODIUM 200 MG/2ML IV SOLN
INTRAVENOUS | Status: DC | PRN
  Administered 2020-11-05: 200 mg via INTRAVENOUS

## 2020-11-05 MED ORDER — PROPOFOL 10 MG/ML IV BOLUS
INTRAVENOUS | Status: DC | PRN
  Administered 2020-11-05: 130 mg via INTRAVENOUS

## 2020-11-05 MED ORDER — SODIUM CHLORIDE 0.9 % IV SOLN: INTRAVENOUS | Status: DC

## 2020-11-05 MED ORDER — PHENYLEPHRINE 40 MCG/ML (10ML) SYRINGE FOR IV PUSH (FOR BLOOD PRESSURE SUPPORT)
PREFILLED_SYRINGE | INTRAVENOUS | Status: AC
  Filled 2020-11-05: qty 10

## 2020-11-05 MED ORDER — PROTAMINE SULFATE 10 MG/ML IV SOLN
INTRAVENOUS | Status: DC | PRN
  Administered 2020-11-05: 50 mg via INTRAVENOUS

## 2020-11-05 MED ORDER — GUAIFENESIN-DM 100-10 MG/5ML PO SYRP: 15.0000 mL | ORAL_SOLUTION | ORAL | Status: DC | PRN

## 2020-11-05 MED ORDER — CLOPIDOGREL BISULFATE 75 MG PO TABS
75.0000 mg | ORAL_TABLET | Freq: Every day | ORAL | Status: DC
  Administered 2020-11-06: 75 mg via ORAL
  Filled 2020-11-05: qty 1

## 2020-11-05 MED ORDER — DEXAMETHASONE SODIUM PHOSPHATE 10 MG/ML IJ SOLN
INTRAMUSCULAR | Status: DC | PRN
  Administered 2020-11-05: 10 mg via INTRAVENOUS

## 2020-11-05 MED ORDER — FUROSEMIDE 20 MG PO TABS: 20.0000 mg | ORAL_TABLET | Freq: Every day | ORAL | Status: DC

## 2020-11-05 MED ORDER — OXYCODONE HCL 5 MG PO TABS: 5.0000 mg | ORAL_TABLET | Freq: Once | ORAL | Status: DC | PRN

## 2020-11-05 MED ORDER — ONDANSETRON HCL 4 MG/2ML IJ SOLN: 4.0000 mg | Freq: Four times a day (QID) | INTRAMUSCULAR | Status: DC | PRN

## 2020-11-05 MED ORDER — MAGNESIUM SULFATE 2 GM/50ML IV SOLN: 2.0000 g | Freq: Every day | INTRAVENOUS | Status: DC | PRN

## 2020-11-05 MED ORDER — UMECLIDINIUM-VILANTEROL 62.5-25 MCG/INH IN AEPB
1.0000 | INHALATION_SPRAY | Freq: Every day | RESPIRATORY_TRACT | Status: DC
  Filled 2020-11-05: qty 14

## 2020-11-05 MED ORDER — BISACODYL 5 MG PO TBEC: 5.0000 mg | DELAYED_RELEASE_TABLET | Freq: Every day | ORAL | Status: DC | PRN

## 2020-11-05 MED ORDER — CARBOXYMETHYLCELLULOSE SODIUM 1 % OP SOLN: 1.0000 [drp] | Freq: Three times a day (TID) | OPHTHALMIC | Status: DC

## 2020-11-05 MED ORDER — DOXYCYCLINE HYCLATE 50 MG PO CAPS
50.0000 mg | ORAL_CAPSULE | Freq: Every day | ORAL | Status: DC
  Filled 2020-11-05: qty 1

## 2020-11-05 MED ORDER — BENFOTIAMINE 150 MG PO CAPS: 250.0000 mg | ORAL_CAPSULE | Freq: Two times a day (BID) | ORAL | Status: DC

## 2020-11-05 MED ORDER — SODIUM CHLORIDE 0.9 % IV SOLN: 500.0000 mL | Freq: Once | INTRAVENOUS | Status: DC | PRN

## 2020-11-05 MED ORDER — NITROGLYCERIN 0.4 MG SL SUBL: 0.4000 mg | SUBLINGUAL_TABLET | SUBLINGUAL | Status: DC | PRN

## 2020-11-05 MED ORDER — EMPAGLIFLOZIN 10 MG PO TABS
10.0000 mg | ORAL_TABLET | Freq: Every day | ORAL | Status: DC
  Administered 2020-11-05 – 2020-11-06 (×2): 10 mg via ORAL
  Filled 2020-11-05 (×2): qty 1

## 2020-11-05 MED ORDER — POTASSIUM CHLORIDE CRYS ER 20 MEQ PO TBCR
20.0000 meq | EXTENDED_RELEASE_TABLET | Freq: Two times a day (BID) | ORAL | Status: DC
  Administered 2020-11-05 – 2020-11-06 (×2): 20 meq via ORAL
  Filled 2020-11-05 (×2): qty 1

## 2020-11-05 MED ORDER — PROMETHAZINE HCL 25 MG/ML IJ SOLN: 6.2500 mg | INTRAMUSCULAR | Status: DC | PRN

## 2020-11-05 MED ORDER — ORAL CARE MOUTH RINSE: 15.0000 mL | Freq: Once | OROMUCOSAL | Status: AC

## 2020-11-05 MED ORDER — L-LYSINE 1000 MG PO TABS: 1000.0000 mg | ORAL_TABLET | Freq: Every day | ORAL | Status: DC

## 2020-11-05 MED ORDER — SODIUM CHLORIDE 0.9 % IV SOLN
INTRAVENOUS | Status: DC | PRN
  Administered 2020-11-05: 500 mL

## 2020-11-05 MED ORDER — PANTOPRAZOLE SODIUM 40 MG PO TBEC
40.0000 mg | DELAYED_RELEASE_TABLET | Freq: Every day | ORAL | Status: DC
  Administered 2020-11-05 – 2020-11-06 (×2): 40 mg via ORAL
  Filled 2020-11-05 (×2): qty 1

## 2020-11-05 MED ORDER — OXYCODONE HCL 5 MG/5ML PO SOLN: 5.0000 mg | Freq: Once | ORAL | Status: DC | PRN

## 2020-11-05 MED ORDER — FENTANYL CITRATE (PF) 250 MCG/5ML IJ SOLN
INTRAMUSCULAR | Status: DC | PRN
  Administered 2020-11-05: 50 ug via INTRAVENOUS
  Administered 2020-11-05: 100 ug via INTRAVENOUS
  Administered 2020-11-05: 50 ug via INTRAVENOUS

## 2020-11-05 MED ORDER — ISOSORBIDE MONONITRATE ER 30 MG PO TB24
30.0000 mg | ORAL_TABLET | Freq: Every day | ORAL | Status: DC
  Administered 2020-11-06: 30 mg via ORAL
  Filled 2020-11-05: qty 1

## 2020-11-05 MED ORDER — POTASSIUM CHLORIDE CRYS ER 20 MEQ PO TBCR: 20.0000 meq | EXTENDED_RELEASE_TABLET | Freq: Every day | ORAL | Status: DC | PRN

## 2020-11-05 MED ORDER — POLYVINYL ALCOHOL 1.4 % OP SOLN
1.0000 [drp] | Freq: Three times a day (TID) | OPHTHALMIC | Status: DC | PRN
  Filled 2020-11-05: qty 15

## 2020-11-05 MED ORDER — PROPOFOL 10 MG/ML IV BOLUS
INTRAVENOUS | Status: AC
  Filled 2020-11-05: qty 40

## 2020-11-05 MED ORDER — MEPERIDINE HCL 25 MG/ML IJ SOLN: 6.2500 mg | INTRAMUSCULAR | Status: DC | PRN

## 2020-11-05 MED ORDER — HYDRALAZINE HCL 20 MG/ML IJ SOLN: 5.0000 mg | INTRAMUSCULAR | Status: DC | PRN

## 2020-11-05 MED ORDER — DOXAZOSIN MESYLATE 2 MG PO TABS
2.0000 mg | ORAL_TABLET | Freq: Every evening | ORAL | Status: DC
  Administered 2020-11-05: 2 mg via ORAL
  Filled 2020-11-05: qty 1

## 2020-11-05 MED ORDER — LIDOCAINE 2% (20 MG/ML) 5 ML SYRINGE
INTRAMUSCULAR | Status: DC | PRN
  Administered 2020-11-05: 30 mg via INTRAVENOUS

## 2020-11-05 MED ORDER — HYDROMORPHONE HCL 1 MG/ML IJ SOLN: 0.2500 mg | INTRAMUSCULAR | Status: DC | PRN

## 2020-11-05 MED ORDER — ROPINIROLE HCL 1 MG PO TABS
1.0000 mg | ORAL_TABLET | Freq: Every evening | ORAL | Status: DC
  Administered 2020-11-05: 1 mg via ORAL
  Filled 2020-11-05: qty 1

## 2020-11-05 MED ORDER — SENNOSIDES-DOCUSATE SODIUM 8.6-50 MG PO TABS: 1.0000 | ORAL_TABLET | Freq: Every evening | ORAL | Status: DC | PRN

## 2020-11-05 MED ORDER — PHENOL 1.4 % MT LIQD: 1.0000 | OROMUCOSAL | Status: DC | PRN

## 2020-11-05 MED ORDER — LOSARTAN POTASSIUM 50 MG PO TABS
100.0000 mg | ORAL_TABLET | Freq: Every day | ORAL | Status: DC
  Administered 2020-11-05 – 2020-11-06 (×2): 100 mg via ORAL
  Filled 2020-11-05 (×2): qty 2

## 2020-11-05 MED ORDER — CHLORHEXIDINE GLUCONATE 0.12 % MT SOLN
15.0000 mL | Freq: Once | OROMUCOSAL | Status: AC
  Administered 2020-11-05: 15 mL via OROMUCOSAL
  Filled 2020-11-05: qty 15

## 2020-11-05 MED ORDER — EPHEDRINE SULFATE-NACL 50-0.9 MG/10ML-% IV SOSY
PREFILLED_SYRINGE | INTRAVENOUS | Status: DC | PRN
  Administered 2020-11-05 (×2): 5 mg via INTRAVENOUS
  Administered 2020-11-05: 10 mg via INTRAVENOUS
  Administered 2020-11-05 (×2): 5 mg via INTRAVENOUS

## 2020-11-05 MED ORDER — CEFAZOLIN SODIUM-DEXTROSE 2-4 GM/100ML-% IV SOLN
2.0000 g | Freq: Three times a day (TID) | INTRAVENOUS | Status: AC
  Administered 2020-11-05 – 2020-11-06 (×2): 2 g via INTRAVENOUS
  Filled 2020-11-05 (×2): qty 100

## 2020-11-05 MED ORDER — ACETAMINOPHEN 650 MG RE SUPP: 325.0000 mg | RECTAL | Status: DC | PRN

## 2020-11-05 MED ORDER — SIMVASTATIN 20 MG PO TABS
40.0000 mg | ORAL_TABLET | Freq: Every evening | ORAL | Status: DC
  Administered 2020-11-05: 40 mg via ORAL
  Filled 2020-11-05: qty 2

## 2020-11-05 SURGICAL SUPPLY — 66 items
CANISTER SUCT 3000ML PPV (MISCELLANEOUS) ×2 IMPLANT
CATH BEACON 5 .035 65 KMP TIP (CATHETERS) ×2 IMPLANT
CATH OMNI FLUSH 5F 65CM (CATHETERS) ×2 IMPLANT
CATH SHOCKWAVE 6.0X60 (CATHETERS) ×2 IMPLANT
CLIP VESOCCLUDE MED 24/CT (CLIP) ×2 IMPLANT
CLIP VESOCCLUDE SM WIDE 24/CT (CLIP) ×2 IMPLANT
DERMABOND ADVANCED (GAUZE/BANDAGES/DRESSINGS) ×1
DERMABOND ADVANCED .7 DNX12 (GAUZE/BANDAGES/DRESSINGS) ×1 IMPLANT
DRAIN CHANNEL 15F RND FF W/TCR (WOUND CARE) IMPLANT
DRAPE C-ARM 42X72 X-RAY (DRAPES) ×2 IMPLANT
DRAPE X-RAY CASS 24X20 (DRAPES) IMPLANT
ELECT REM PT RETURN 9FT ADLT (ELECTROSURGICAL) ×2
ELECTRODE REM PT RTRN 9FT ADLT (ELECTROSURGICAL) ×1 IMPLANT
EVACUATOR SILICONE 100CC (DRAIN) IMPLANT
GLOVE BIOGEL PI IND STRL 7.5 (GLOVE) ×1 IMPLANT
GLOVE BIOGEL PI INDICATOR 7.5 (GLOVE) ×1
GLOVE SURG SS PI 7.5 STRL IVOR (GLOVE) ×2 IMPLANT
GOWN STRL REUS W/ TWL LRG LVL3 (GOWN DISPOSABLE) ×2 IMPLANT
GOWN STRL REUS W/ TWL XL LVL3 (GOWN DISPOSABLE) ×1 IMPLANT
GOWN STRL REUS W/TWL LRG LVL3 (GOWN DISPOSABLE) ×2
GOWN STRL REUS W/TWL XL LVL3 (GOWN DISPOSABLE) ×1
GRAFT VASC PATCH XENOSURE 1X14 (Vascular Products) ×2 IMPLANT
HEMOSTAT SNOW SURGICEL 2X4 (HEMOSTASIS) IMPLANT
KIT BASIN OR (CUSTOM PROCEDURE TRAY) ×2 IMPLANT
KIT DRSG PREVENA PLUS 7DAY 125 (MISCELLANEOUS) ×2 IMPLANT
KIT ENCORE 26 ADVANTAGE (KITS) IMPLANT
KIT PREVENA INCISION MGT 13 (CANNISTER) ×2 IMPLANT
KIT TURNOVER KIT B (KITS) ×2 IMPLANT
NS IRRIG 1000ML POUR BTL (IV SOLUTION) ×4 IMPLANT
PACK ENDO MINOR (CUSTOM PROCEDURE TRAY) IMPLANT
PACK PERIPHERAL VASCULAR (CUSTOM PROCEDURE TRAY) ×2 IMPLANT
PAD ARMBOARD 7.5X6 YLW CONV (MISCELLANEOUS) ×4 IMPLANT
PROTECTION STATION PRESSURIZED (MISCELLANEOUS) ×2
SET COLLECT BLD 21X3/4 12 (NEEDLE) IMPLANT
SET MICROPUNCTURE 5F STIFF (MISCELLANEOUS) ×4 IMPLANT
SET WALTER ACTIVATION W/DRAPE (SET/KITS/TRAYS/PACK) ×2 IMPLANT
SHEATH BRITE TIP 7FRX11 (SHEATH) ×2 IMPLANT
SHEATH BRITE TIP 8FR 23CM (SHEATH) ×2 IMPLANT
SHEATH PINNACLE 5F 10CM (SHEATH) ×2 IMPLANT
SLEEVE ISOL F/PACE RF HD COVER (MISCELLANEOUS) ×2 IMPLANT
SPONGE INTESTINAL PEANUT (DISPOSABLE) ×4 IMPLANT
STATION PROTECTION PRESSURIZED (MISCELLANEOUS) ×1 IMPLANT
STENT VIABAHN VBX 8X79X80 (Permanent Stent) ×2 IMPLANT
STOPCOCK 4 WAY LG BORE MALE ST (IV SETS) IMPLANT
STOPCOCK MORSE 400PSI 3WAY (MISCELLANEOUS) ×2 IMPLANT
SURGIFLO W/THROMBIN 8M KIT (HEMOSTASIS) ×2 IMPLANT
SUT ETHILON 3 0 PS 1 (SUTURE) IMPLANT
SUT MNCRL AB 4-0 PS2 18 (SUTURE) ×2 IMPLANT
SUT PROLENE 5 0 C 1 24 (SUTURE) ×6 IMPLANT
SUT PROLENE 6 0 BV (SUTURE) ×4 IMPLANT
SUT PROLENE 7 0 BV1 MDA (SUTURE) ×2 IMPLANT
SUT VIC AB 2-0 CT1 27 (SUTURE) ×1
SUT VIC AB 2-0 CT1 TAPERPNT 27 (SUTURE) ×1 IMPLANT
SUT VIC AB 3-0 SH 27 (SUTURE) ×1
SUT VIC AB 3-0 SH 27X BRD (SUTURE) ×1 IMPLANT
SUT VIC AB 3-0 X1 27 (SUTURE) ×2 IMPLANT
SYR 3ML LL SCALE MARK (SYRINGE) ×6 IMPLANT
SYR MEDRAD MARK V 150ML (SYRINGE) IMPLANT
TOWEL GREEN STERILE (TOWEL DISPOSABLE) ×2 IMPLANT
TUBING EXTENTION W/L.L. (IV SETS) IMPLANT
TUBING HIGH PRESSURE 120CM (CONNECTOR) IMPLANT
UNDERPAD 30X36 HEAVY ABSORB (UNDERPADS AND DIAPERS) ×2 IMPLANT
WATER STERILE IRR 1000ML POUR (IV SOLUTION) ×2 IMPLANT
WIRE AMPLATZ SS-J .035X180CM (WIRE) ×2 IMPLANT
WIRE BENTSON .035X145CM (WIRE) ×4 IMPLANT
WIRE HI TORQ VERSACORE J 260CM (WIRE) ×2 IMPLANT

## 2020-11-05 NOTE — Op Note (Signed)
Patient name: Anthony [name] MRN: [id number] DOB: 01/09/1946 Sex: male  11/05/2020 Pre-operative Diagnosis: Severe left leg claudication Post-operative diagnosis:  Same Surgeon:  Annamarie Major Assistants: Laurence Slate Procedure:   #1: Extensive endarterectomy of the left external iliac, common femoral, superficial femoral, and profundofemoral artery   #2: Bovine pericardial patch angioplasty of the left common femoral and profundofemoral artery   #3: Separate bovine pericardial patch angioplasty of the right superficial femoral artery   #4: Arterial lithotripsy of the left common and left external iliac artery   #5: Stent, left common and left external iliac artery   #6: Prevena wound VAC  Anesthesia: General Blood Loss: 150 cc Specimens: None  Findings: Occluded left common femoral artery and proximal superficial femoral and profundofemoral artery.  Endarterectomy was performed up into the external iliac artery.  A bovine patch was placed from the proximal common femoral artery down to the profunda bifurcation.  A separate arteriotomy on the superficial femoral artery was performed and patch angioplasty was performed after the endarterectomy.  Shockwave lithotripsy was performed in the iliac system followed by placement of a VBX 8 x 79 stent.  Indications: The patient has previously undergone right femoral endarterectomy.  He has had progression of symptoms on the left side.  Recent angiography showed an occluded common femoral artery as well as a high-grade lesion in his external and common iliac arteries.  He comes in today for intervention.  Procedure:  The patient was identified in the holding area and taken to [place] 16  The patient was then placed supine on the table. general anesthesia was administered.  The patient was prepped and draped in the usual sterile fashion.  A time out was called and antibiotics were administered.  A PA was necessary to expedite the procedure and  assist with the technical details.  A longitudinal incision was made in the groin.  Cautery was used about subcutaneous tissue down to the femoral sheath which was opened sharply.  I dissected out the proximal common femoral artery and ligated the crossing circumflex iliac veins.  Multiple side branches were individually isolated.  I then dissected out the profundofemoral artery.  This was heavily calcified down to the primary branches.  Both branches were individually isolated.  The superficial femoral artery was also heavily calcified.  It was exposed for approximately 5 cm until I got to an area that was relatively soft.  Once adequate exposure was obtained, the patient was fully heparinized.  Heparin levels were monitored with ACT checks and redosed appropriately.  The vessels were then occluded.  A #11 blade was used to make an arteriotomy in the common femoral artery which was extended longitudinally with Potts scissors.  There was exophytic plaque within the common femoral artery which was occluded.  I performed endarterectomy of the common femoral artery and removed plaque in the proximal superficial femoral artery.  I then worked my way down removing the plaque in the profundofemoral artery until I got a good endpoint which was at the level of the primary branch.  I then proceeded with endarterectomy proximally.  I was ultimately able to get to a patent artery.  Once this was done a #4 Fogarty catheter was inserted for proximal control.  I removed all the plaque within the common femoral artery and the distal external iliac artery.  All potential embolic debris was removed.  Next, a bovine pericardial patch was selected and patch angioplasty was performed with running 5-0 Prolene.  Prior to completion the appropriate flushing maneuvers were performed and the anastomosis was completed.  There was improved blood flow in the groin however certainly this was not adequate.  Patient also had a very calcified  proximal superficial femoral artery that I could not address with the initial endarterectomy.  I occluded the common femoral and profundofemoral and superficial femoral artery again and then opened the superficial femoral artery beginning approximately 2 cm from its origin.  Endarterectomy was performed.  There was good backbleeding.  All potential embolic debris was removed.  A second bovine pericardial patch was selected and patch angioplasty was performed with running 5-0 Prolene.  Prior to completion the appropriate flushing maneuvers were performed and the anastomosis was finished.  Next attention was turned towards the endovascular portion of the procedure.  The bovine patch was cannulated with a micropuncture needle.  An 018 wire was advanced and a micropuncture sheath was placed.  A Bentson wire was then inserted followed by 5 French sheath.  Using the [facility] 2 catheter and a Bentson wire, I was able to get through the stenosis in the iliac system and placed the catheter into the aorta.  An 014 Sparta core wire was then inserted.  I then performed retrograde angiography which showed that the hypogastric artery was occluded.  There was high-grade lesions in the common and external iliac artery, greater than 80%.  I then selected a 6 x 60 shockwave lithotripsy balloon and perform lithotripsy of the common and external iliac artery.  This was initially done for 30 seconds at 2 atm and then for 60 seconds at 4 atm.  The balloon was moved and additional treatment was performed.  Total treatment area was approximately 8 cm.  At this point, a Berenstein catheter was inserted and a Sparta core wire was placed.  I then advanced a 8 French 23 cm bright tip sheath into the aorta.  I selected an 8 x 17 Gore VBX stent.  This was deployed just distal to the aortic bifurcation down into the external iliac artery.  The balloon was taken to burst pressure.  Completion imaging showed resolution of the stenosis.  The  sheath was then removed and the arteriotomy closed with 6-0 Prolene.  At this point the patient had a excellent palpable femoral pulse and triphasic signals in the profunda and superficial femoral artery.  The patient's heparin was then reversed with protamine.  Once hemostasis was satisfactory, the femoral sheath was reapproximated with 2-0 Vicryl.  The subcutaneous tissue was then closed with additional layers of 2-0 Vicryl followed by 3-0 Vicryl subcutaneous closure and subcuticular closure with 4-0 Vicryl.  A Prevena wound VAC was placed over the incision.  The patient was successfully extubated and taken to recovery in stable condition.  There are no immediate complications   Disposition:.  To PACU stable.   Theotis Burrow, M.D., [facility] [place] Office: [phone] Pager:  [phone]

## 2020-11-05 NOTE — H&P (Signed)
[facility] [place]  Patient name: Anthony [name]        MRN: [id number]        DOB: February 25, 1946          Sex: male   REASON FOR VISIT:    Follow-up  HISOTRY OF PRESENT ILLNESS:    Anthony Bonaventure Gordonis a 74 y.o.malewho returns today for follow-up. He is status post right external iliac, common femoral, superficial femoral, and profunda femoral endarterectomy with bovine pericardial patch and plasty on 10/10/2014. This was done for lifestyle limiting claudication which failed conservative management. His postoperative course was uncomplicated. His quality of life  dramatically improved.   I have not seen him for 2 years.  He reports a deterioration in his quality of life and limited mobility.  In December 2017 he underwent angiography for possible stenosis. Angiography revealed his intervention to be widely patent.  The patient suffers from diabetes. His most recent hemoglobin A1c is 7.1. He is a former smoker. He takes a statin for hypercholesterolemia. He is on an ARB for hypertension.   PAST MEDICAL HISTORY:       Past Medical History:  Diagnosis Date  . Arthritis   . CAD (coronary artery disease)    stent placed 06/02/2000  . Cataract    had surgery  . Congestive heart failure ([place])   . COPD (chronic obstructive pulmonary disease) ([place]) 08/11/2012  . Diabetes mellitus ([place])   . Hyperlipidemia   . Hypertension   . Lumbar stenosis   . Neuromuscular disorder (HCC)    neuropathy in feet  . OSA (obstructive sleep apnea) 08/11/2012   cpcap  . Peripheral vascular disease ([place])   . Pneumonia 1999  . Rosacea   . Shortness of breath    with exertion  . Sleep apnea      FAMILY HISTORY:        Family History  Problem Relation Age of Onset  . Heart disease Father 73  . Cancer Father   . Hyperlipidemia Father   . Hypertension Father   . Heart attack Father   . Cancer Mother   . Deep vein  thrombosis Mother        Varicose veins  . Diabetes Mother   . Hyperlipidemia Mother   . Hypertension Mother     SOCIAL HISTORY:   Social History        Tobacco Use  . Smoking status: Former Smoker    Packs/day: 2.00    Years: 28.00    Pack years: 56.00    Types: Cigarettes    Quit date: 08/03/1987    Years since quitting: 33.0  . Smokeless tobacco: Never Used  Substance Use Topics  . Alcohol use: Yes    Comment: weekly     ALLERGIES:   No Known Allergies   CURRENT MEDICATIONS:         Current Outpatient Medications  Medication Sig Dispense Refill  . albuterol (PROAIR HFA) 108 (90 Base) MCG/ACT inhaler Inhale 2 puffs into the lungs every 6 (six) hours as needed for wheezing or shortness of breath. 3 Inhaler 3  . ANORO ELLIPTA 62.5-25 MCG/INH AEPB USE 1 INHALATION BY MOUTH  DAILY 180 each 0  . ASPIRIN LOW DOSE 81 MG EC tablet Take 81 mg by mouth daily.    . B Complex-Folic Acid (BENFOTIAMINE MULTI-B PO) Take 1 tablet by mouth 2 (two) times daily.     . bisoprolol (ZEBETA) 5 MG tablet TAKE 1  TABLET BY MOUTH  DAILY 90 tablet 3  . CINNAMON PO Take 2,000 mg by mouth every evening.     . diclofenac sodium (VOLTAREN) 1 % GEL Apply 1 application topically daily as needed (back pain).    Marland Kitchen doxazosin (CARDURA) 2 MG tablet Take 2 mg by mouth every evening.     Marland Kitchen doxycycline (VIBRAMYCIN) 50 MG capsule Take 1 capsule by mouth daily.    . empagliflozin (JARDIANCE) 10 MG TABS tablet Take 10 mg by mouth daily.    . fish oil-omega-3 fatty acids 1000 MG capsule Take 3 g by mouth daily.     . fluticasone (FLONASE) 50 MCG/ACT nasal spray Place 2 sprays into both nostrils as needed for allergies or rhinitis.    . furosemide (LASIX) 20 MG tablet Take 20 mg by mouth 2 (two) times daily.     Marland Kitchen GARLIC PO Take 5,697 mg by mouth daily.     . isosorbide mononitrate (IMDUR) 30 MG 24 hr tablet TAKE 1 TABLET BY MOUTH  DAILY 90 tablet 3  .  L-Lysine 1000 MG TABS Take 1,000 mg by mouth daily.     Marland Kitchen losartan (COZAAR) 100 MG tablet Take 100 mg by mouth daily.    . metFORMIN (GLUCOPHAGE) 1000 MG tablet Take 1,000 mg by mouth 2 (two) times daily with a meal.    . Multiple Vitamin (MULTIVITAMIN) tablet Take 1 tablet by mouth daily.    . pentoxifylline (TRENTAL) 400 MG CR tablet TAKE 1 TABLET BY MOUTH  TWICE A DAY 180 tablet 3  . potassium chloride SA (K-DUR,KLOR-CON) 20 MEQ tablet Take 20 mEq by mouth 2 (two) times daily.    Marland Kitchen rOPINIRole (REQUIP) 1 MG tablet Take 1 mg by mouth every evening.     . simvastatin (ZOCOR) 40 MG tablet Take 40 mg by mouth every evening.    . SUPER B COMPLEX/C PO Take 1 tablet by mouth daily.    . nitroGLYCERIN (NITROSTAT) 0.4 MG SL tablet Place 1 tablet (0.4 mg total) under the tongue every 5 (five) minutes as needed for chest pain. 90 tablet 3   No current facility-administered medications for this visit.    REVIEW OF SYSTEMS:   [X]  denotes positive finding, [ ]  denotes negative finding Cardiac  Comments:  Chest pain or chest pressure:    Shortness of breath upon exertion:    Short of breath when lying flat:    Irregular heart rhythm:        Vascular    Pain in calf, thigh, or hip brought on by ambulation: x   Pain in feet at night that wakes you up from your sleep:     Blood clot in your veins:    Leg swelling:         Pulmonary    Oxygen at home:    Productive cough:     Wheezing:         Neurologic    Sudden weakness in arms or legs:     Sudden numbness in arms or legs:     Sudden onset of difficulty speaking or slurred speech:    Temporary loss of vision in one eye:     Problems with dizziness:         Gastrointestinal    Blood in stool:     Vomited blood:         Genitourinary    Burning when urinating:     Blood in urine:  Psychiatric    Major depression:          Hematologic    Bleeding problems:    Problems with blood clotting too easily:        Skin    Rashes or ulcers:        Constitutional    Fever or chills:      PHYSICAL EXAM:      Vitals:   08/04/20 1227  BP: 117/64  Pulse: 68  Resp: 20  Temp: 98 F (36.7 C)  SpO2: 94%  Weight: 220 lb (99.8 kg)  Height: 5' 7.5" (1.715 m)    GENERAL: The patient is a well-nourished male, in no acute distress. The vital signs are documented above. CARDIAC: There is a regular rate and rhythm.  VASCULAR: Nonpalpable pedal pulses PULMONARY: Non-labored respirations ABDOMEN: Soft and non-tender with normal pitched bowel sounds.  MUSCULOSKELETAL: There are no major deformities or cyanosis. NEUROLOGIC: No focal weakness or paresthesias are detected. SKIN: There are no ulcers or rashes noted. PSYCHIATRIC: The patient has a normal affect.  STUDIES:   I have reviewed the following studies: +-------+-----------+-----------+------------+------------+  ABI/TBIToday's ABIToday's TBIPrevious ABIPrevious TBI  +-------+-----------+-----------+------------+------------+  Right 0.70    0.48    0.59    0.38      +-------+-----------+-----------+------------+------------+  Left  0.63    0.37    0.56    0.32      +-------+-----------+-----------+------------+------------+  MEDICAL ISSUES:   Progression with symptoms of bilateral claudication, with limited mobility.  We discussed proceeding with diagnostic angiography to determine what has changed since he was last evaluated.  I will plan on doing this through a right femoral approach.  If there is anything on the left leg that I can intervene on, I would do so at that time.  He is scheduled to get a colonoscopy at the end of August and so we will do this afterwards.    Annamarie Major, IV, MD, FACS [facility] [phone] Pager [phone]   Angiography revealed an occluded left femoral artery and left iliac stenosis.  He continues to have severe left leg claudication. CV:RRR PULM:CTA Neuro: intact Plan:  I discussed proceeding with left femoral endarterectomy and left iliac stent.  All questions answered  WElls Genee Rann

## 2020-11-05 NOTE — Anesthesia Postprocedure Evaluation (Signed)
Anesthesia Post Note  Patient: Anthony [name]  Procedure(s) Performed: LEFT FEMORAL ENDARTERECTOMY WITH PATCH ANGIOPLASTY (Left Groin) INSERTION OF ILIAC STENT (Left Groin) INTRAVASCULAR LITHOTRIPSY (Left Groin)     Patient location during evaluation: PACU Anesthesia Type: General Level of consciousness: awake and alert, patient cooperative and oriented Pain management: pain level controlled Vital Signs Assessment: post-procedure vital signs reviewed and stable Respiratory status: spontaneous breathing, nonlabored ventilation, respiratory function stable and patient connected to nasal cannula oxygen Cardiovascular status: blood pressure returned to baseline and stable Postop Assessment: no apparent nausea or vomiting Anesthetic complications: no   No complications documented.  Last Vitals:  Vitals:   11/05/20 1646 11/05/20 1700  BP: (!) 111/58 124/61  Pulse: 83 81  Resp: 18 18  Temp:    SpO2: 97% 94%    Last Pain:  Vitals:   11/05/20 1515  TempSrc: Oral  PainSc: 1                  Daphna Lafuente,E. Francelia Mclaren

## 2020-11-05 NOTE — Progress Notes (Signed)
Pt set up on CPAP 13 CMH20.  Pt has his own nasal pillow mask attached to our tubing and CPAP machine.

## 2020-11-05 NOTE — Transfer of Care (Signed)
Immediate Anesthesia Transfer of Care Note  Patient: Anthony [name]  Procedure(s) Performed: LEFT FEMORAL ENDARTERECTOMY WITH PATCH ANGIOPLASTY (Left Groin) INSERTION OF ILIAC STENT (Left Groin)  Patient Location: PACU  Anesthesia Type:General  Level of Consciousness: awake, alert , oriented and patient cooperative  Airway & Oxygen Therapy: Patient Spontanous Breathing and Patient connected to face mask oxygen  Post-op Assessment: Report given to RN and Post -op Vital signs reviewed and stable  Post vital signs: Reviewed and stable  Last Vitals:  Vitals Value Taken Time  BP 103/61 11/05/20 1323  Temp    Pulse 85 11/05/20 1325  Resp 26 11/05/20 1325  SpO2 97 % 11/05/20 1325  Vitals shown include unvalidated device data.  Last Pain:  Vitals:   11/05/20 0719  TempSrc:   PainSc: 0-No pain         Complications: No complications documented.

## 2020-11-05 NOTE — Anesthesia Procedure Notes (Signed)
Procedure Name: Intubation Date/Time: 11/05/2020 8:41 AM Performed by: Thelma Comp, CRNA Pre-anesthesia Checklist: Patient identified, Emergency Drugs available, Suction available and Patient being monitored Patient Re-evaluated:Patient Re-evaluated prior to induction Oxygen Delivery Method: Circle System Utilized Preoxygenation: Pre-oxygenation with 100% oxygen Induction Type: IV induction Ventilation: Mask ventilation without difficulty Laryngoscope Size: Mac and 4 Grade View: Grade II Tube type: Oral Tube size: 7.5 mm Number of attempts: 1 Airway Equipment and Method: Stylet and Oral airway Placement Confirmation: ETT inserted through vocal cords under direct vision,  positive ETCO2 and breath sounds checked- equal and bilateral Secured at: 23 cm Tube secured with: Tape Dental Injury: Teeth and Oropharynx as per pre-operative assessment

## 2020-11-05 NOTE — Progress Notes (Signed)
S/p left femoral endarterectomy and left iliac stent Doing well this evening Provena in place and functioning Start Plavix tomorrow and likely d/c  Anthony [name]

## 2020-11-06 ENCOUNTER — Encounter (HOSPITAL_COMMUNITY): Payer: Self-pay | Admitting: Surgery

## 2020-11-06 LAB — BASIC METABOLIC PANEL
Anion gap: 8 (ref 5–15)
BUN: 27 mg/dL — ABNORMAL HIGH (ref 8–23)
CO2: 21 mmol/L — ABNORMAL LOW (ref 22–32)
Calcium: 8.6 mg/dL — ABNORMAL LOW (ref 8.9–10.3)
Chloride: 107 mmol/L (ref 98–111)
Creatinine, Ser: 1.26 mg/dL — ABNORMAL HIGH (ref 0.61–1.24)
GFR, Estimated: 59 mL/min — ABNORMAL LOW (ref >60)
Glucose, Bld: 184 mg/dL — ABNORMAL HIGH (ref 70–99)
Potassium: 4.6 mmol/L (ref 3.5–5.1)
Sodium: 136 mmol/L (ref 135–145)

## 2020-11-06 LAB — CBC
HCT: 40.3 % (ref 39.0–52.0)
Hemoglobin: 13.3 g/dL (ref 13.0–17.0)
MCH: 31.1 pg (ref 26.0–34.0)
MCHC: 33 g/dL (ref 30.0–36.0)
MCV: 94.4 fL (ref 80.0–100.0)
Platelets: 120 10*3/uL — ABNORMAL LOW (ref 150–400)
RBC: 4.27 MIL/uL (ref 4.22–5.81)
RDW: 13 % (ref 11.5–15.5)
WBC: 7.2 10*3/uL (ref 4.0–10.5)
nRBC: 0 % (ref 0.0–0.2)

## 2020-11-06 LAB — LIPID PANEL
Cholesterol: 118 mg/dL (ref 0–200)
HDL: 50 mg/dL (ref >40)
LDL Cholesterol: 52 mg/dL (ref 0–99)
Total CHOL/HDL Ratio: 2.4 RATIO
Triglycerides: 81 mg/dL (ref <150)
VLDL: 16 mg/dL (ref 0–40)

## 2020-11-06 LAB — GLUCOSE, CAPILLARY: Glucose-Capillary: 135 mg/dL — ABNORMAL HIGH (ref 70–99)

## 2020-11-06 MED ORDER — OXYCODONE-ACETAMINOPHEN 5-325 MG PO TABS
1.0000 | ORAL_TABLET | ORAL | 0 refills | Status: DC | PRN
Start: 2020-11-06 — End: 2021-05-21

## 2020-11-06 MED ORDER — CLOPIDOGREL BISULFATE 75 MG PO TABS: 75.0000 mg | ORAL_TABLET | Freq: Every day | ORAL | 3 refills | Status: DC

## 2020-11-06 NOTE — Discharge Summary (Signed)
Bypass Discharge Summary Patient ID: Anthony [name] [id number] 74 y.o. 1946-09-06  Admit date: 11/05/2020  Discharge date and time: No discharge date for patient encounter.   Admitting Physician: Serafina Mitchell, MD   Discharge Physician: Serafina Mitchell, MD  Admission Diagnoses: Femoral artery occlusion, left ([place]) [I70.202] PAD (peripheral artery disease) ([place]) [I73.9]  Discharge Diagnoses: Femoral artery occlusion, left; Peripheral arterial disease  Admission Condition: fair  Discharged Condition: good  Indication for Admission: lifestyle limiting claudication  Hospital Course: 74 year old male who was admitted on 11/05/20 and underwent extensive endarterectomy of the left external iliac, common femoral, superficial femoral and profundofemoral artery; Bovine pericardial patch angioplasty of the left common femoral and profundofemoral artery; separate bovine pericardial patch angioplasty of the right superficial femoral artery; arterial lithotripsy of the left common and left external iliac artery; stent, left common and left external iliac artery, Prevena Wound VAC. This was performed for severe left leg claudication. He tolerated the surgery well and was transferred to the PACU in stable condition. He did well in the PACU and was transferred to the floor in stable condition.  POD#1, left lower extremity well perfused and warm. Left femoral incision intact and dry with Prevena Wound vac with good seal. Pain well controlled. Patient voided without difficulty. Tolerated diet. Was evaluated by PT and OT who did not recommend any follow up. He remained stable for discharge home with Prevena Wound VAC to left groin. PDMP was reviewed and post operative pain medication was sent to patients pharmacy. Patient will go home on Aspirin, Plavix and Statin. He has follow up arranged with Dr. [name] in 3-4 weeks  Consults: None  Treatments: surgery: #1: Extensive endarterectomy of the left  external iliac, common femoral, superficial femoral, and profundofemoral artery                         #2: Bovine pericardial patch angioplasty of the left common femoral and profundofemoral artery                         #3: Separate bovine pericardial patch angioplasty of the right superficial femoral artery                         #4: Arterial lithotripsy of the left common and left external iliac artery                         #5: Stent, left common and left external iliac artery                         #6: Prevena wound VAC    Disposition: Discharge disposition: 01-Home or Self Care       - For VQI Registry use ---  Post-op:  Wound infection: No  Graft infection: No  Transfusion: No  If yes, 0 units given New Arrhythmia: No Patency judged by: [ X] Dopper only, [ ]  Palpable graft pulse, [ ]  Palpable distal pulse, [ ]  ABI inc. > 0.15, [ ]  Duplex D/C Ambulatory Status: Ambulatory  Complications: MI: [ X] No, [ ]  Troponin only, [ ]  EKG or Clinical CHF: No Resp failure: [ ]  none, [ ]  Pneumonia, [ ]  Ventilator Chg in renal function: [ ]  none, [ ]  Inc. Cr > 0.5, [ ]  Temp. Dialysis, [ ]  Permanent dialysis Stroke: [ ]   None, [ ]  Minor, [ ]  Major Return to OR: No  Reason for return to OR: [ ]  Bleeding, [ ]  Infection, [ ]  Thrombosis, [ ]  Revision  Discharge medications: Statin use:  Yes ASA use:  Yes Plavix use:  Yes Beta blocker use: Yes Coumadin use: No, not indicated    Patient Instructions:  Allergies as of 11/06/2020   No Known Allergies     Medication List    STOP taking these medications   albuterol 108 (90 Base) MCG/ACT inhaler Commonly known as: ProAir HFA   diclofenac sodium 1 % Gel Commonly known as: VOLTAREN     TAKE these medications   Anoro Ellipta 62.5-25 MCG/INH Aepb Generic drug: umeclidinium-vilanterol USE 1 INHALATION BY MOUTH  DAILY What changed: See the new instructions.   Aspirin Low Dose 81 MG EC tablet Generic drug: aspirin Take 81  mg by mouth every evening.   BENFOTIAMINE PO Take 250 mg by mouth 2 (two) times daily.   bisoprolol 5 MG tablet Commonly known as: ZEBETA TAKE 1 TABLET BY MOUTH  DAILY What changed: when to take this   carboxymethylcellulose 1 % ophthalmic solution 1 drop 3 (three) times daily.   carboxymethylcellulose 0.5 % Soln Commonly known as: REFRESH PLUS Place 1 drop into both eyes in the morning, at noon, in the evening, and at bedtime.   CINNAMON PO Take 1,000 mg by mouth every evening.   clopidogrel 75 MG tablet Commonly known as: PLAVIX Take 1 tablet (75 mg total) by mouth daily at 6 (six) AM. Start taking on: November 07, 2020   doxazosin 2 MG tablet Commonly known as: CARDURA Take 2 mg by mouth every evening.   doxycycline 50 MG capsule Commonly known as: VIBRAMYCIN Take 50 mg by mouth daily at 12 noon.   fish oil-omega-3 fatty acids 1000 MG capsule Take 2 g by mouth daily.   fluticasone 50 MCG/ACT nasal spray Commonly known as: FLONASE Place 2 sprays into both nostrils at bedtime as needed for allergies or rhinitis.   furosemide 20 MG tablet Commonly known as: LASIX Take 20 mg by mouth daily at 12 noon.   GARLIC PO Take 8,366 mg by mouth daily.   ibuprofen 200 MG tablet Commonly known as: ADVIL Take 400 mg by mouth every 6 (six) hours as needed for headache or moderate pain.   isosorbide mononitrate 30 MG 24 hr tablet Commonly known as: IMDUR TAKE 1 TABLET BY MOUTH  DAILY   Jardiance 10 MG Tabs tablet Generic drug: empagliflozin Take 10 mg by mouth daily.   L-Lysine 1000 MG Tabs Take 1,000 mg by mouth daily.   losartan 100 MG tablet Commonly known as: COZAAR Take 100 mg by mouth daily.   metFORMIN 1000 MG tablet Commonly known as: GLUCOPHAGE Take 1,000 mg by mouth 2 (two) times daily with a meal.   multivitamin tablet Take 1 tablet by mouth daily.   nitroGLYCERIN 0.4 MG SL tablet Commonly known as: NITROSTAT Place 1 tablet (0.4 mg total) under  the tongue every 5 (five) minutes as needed for chest pain.   oxyCODONE-acetaminophen 5-325 MG tablet Commonly known as: Percocet Take 1 tablet by mouth every 4 (four) hours as needed for severe pain.   OZEMPIC (1 MG/DOSE) [place] Inject 1 mg into the skin every Thursday.   pentoxifylline 400 MG CR tablet Commonly known as: TRENTAL TAKE 1 TABLET BY MOUTH  TWICE DAILY   potassium chloride SA 20 MEQ tablet Commonly known as: KLOR-CON Take 20  mEq by mouth 2 (two) times daily.   rOPINIRole 1 MG tablet Commonly known as: REQUIP Take 1 mg by mouth every evening.   simvastatin 40 MG tablet Commonly known as: ZOCOR Take 40 mg by mouth every evening.   SUPER B COMPLEX/C PO Take 1 tablet by mouth daily.      Activity: activity as tolerated Diet: regular diet Wound Care: keep wound clean and dry  Follow-up with Dr. [name] in 3-4 weeks.  Signed: Karoline Caldwell 11/06/2020 7:54 AM

## 2020-11-06 NOTE — Evaluation (Signed)
Physical Therapy Evaluation and D/C Patient Details Name: Anthony [name] MRN: [id number] DOB: [birth year] Today's Date: 11/06/2020   History of Present Illness  74 y.o. male is s/p   #1: Extensive endarterectomy of the left external iliac, common femoral, superficial femoral, and profundofemoral artery.  Clinical Impression  Pt admitted with above diagnosis.Pt currently without functional limitations and is Modif I with all tasks.  Pt wife assists with socks or he uses sock aid and other than that pt is able to complete all tasks on his own. No LOB with ambulation and does not need device.  Pt does not need skilled PT at this time. Signing off.      Follow Up Recommendations No PT follow up    Equipment Recommendations  None recommended by PT    Recommendations for Other Services       Precautions / Restrictions Precautions Precaution Comments: Wound VAC Restrictions Weight Bearing Restrictions: No      Mobility  Bed Mobility Overal bed mobility: Independent                  Transfers Overall transfer level: Independent                  Ambulation/Gait Ambulation/Gait assistance: Independent Gait Distance (Feet): 450 Feet Assistive device: None     Gait velocity interpretation: >2.62 ft/sec, indicative of community ambulatory General Gait Details: Pt Modif I with ambulation in room and in hallway.  No LOB.   Stairs            Wheelchair Mobility    Modified Rankin (Stroke Patients Only)       Balance Overall balance assessment: Independent                                           Pertinent Vitals/Pain Pain Assessment: Faces Faces Pain Scale: Hurts a little bit Pain Location: left groin Pain Descriptors / Indicators: Grimacing;Guarding Pain Intervention(s): Limited activity within patient's tolerance;Monitored during session;Repositioned    Home Living Family/patient expects to be discharged to:: Private  residence Living Arrangements: Spouse/significant other Available Help at Discharge: Family;Available 24 hours/day Type of Home: Other(Comment) (condo) Home Access: Level entry     Home Layout: One level Home Equipment: Walker - 2 wheels;Walker - 4 wheels;Shower seat - built in;Grab bars - tub/shower;Adaptive equipment      Prior Function Level of Independence: Independent         Comments: Wife helps with socks but pt also has sock aid, B/D self.       Hand Dominance   Dominant Hand: Right    Extremity/Trunk Assessment   Upper Extremity Assessment Upper Extremity Assessment: Defer to OT evaluation    Lower Extremity Assessment Lower Extremity Assessment: Overall WFL for tasks assessed    Cervical / Trunk Assessment Cervical / Trunk Assessment: Normal  Communication   Communication: No difficulties  Cognition Arousal/Alertness: Awake/alert Behavior During Therapy: WFL for tasks assessed/performed Overall Cognitive Status: Within Functional Limits for tasks assessed                                        General Comments      Exercises     Assessment/Plan    PT Assessment Patent does not need any further PT services  PT Problem List         PT Treatment Interventions      PT Goals (Current goals can be found in the Care Plan section)  Acute Rehab PT Goals Patient Stated Goal: To go home today PT Goal Formulation: All assessment and education complete, DC therapy    Frequency     Barriers to discharge        Co-evaluation               AM-PAC PT "6 Clicks" Mobility  Outcome Measure Help needed turning from your back to your side while in a flat bed without using bedrails?: None Help needed moving from lying on your back to sitting on the side of a flat bed without using bedrails?: None Help needed moving to and from a bed to a chair (including a wheelchair)?: None Help needed standing up from a chair using your arms  (e.g., wheelchair or bedside chair)?: None Help needed to walk in hospital room?: None Help needed climbing 3-5 steps with a railing? : None 6 Click Score: 24    End of Session Equipment Utilized During Treatment: Gait belt Activity Tolerance: Patient tolerated treatment well Patient left: in bed;with call bell/phone within reach (sitting EOB) Nurse Communication: Mobility status PT Visit Diagnosis: Muscle weakness (generalized) (M62.81)    Time: 7672-0947 PT Time Calculation (min) (ACUTE ONLY): 11 min   Charges:   PT Evaluation $PT Eval Low Complexity: 1 Low          Dorion Petillo W,PT Acute Rehabilitation Services Pager:  [phone]  Office:  [place] 11/06/2020, 9:47 AM

## 2020-11-06 NOTE — Discharge Instructions (Signed)
[facility]  Discharge instructions  Lower Extremity Surgery  Please refer to the following instruction for your post-procedure care. Your surgeon or physician assistant will discuss any changes with you.  Activity  You are encouraged to walk as much as you can. You can slowly return to normal activities during the month after your surgery. Avoid strenuous activity and heavy lifting until your doctor tells you it's OK. Avoid activities such as vacuuming or swinging a golf club. Do not drive until your doctor give the OK and you are no longer taking prescription pain medications. It is also normal to have difficulty with sleep habits, eating and bowel movement after surgery. These will go away with time.  Bathing/Showering  You may shower after you go home. Do not soak in a bathtub, hot tub, or swim until the incision heals completely.  Incision Care  Clean your incision with mild soap and water. Shower every day. Pat the area dry with a clean towel. You do not need a bandage unless otherwise instructed. Do not apply any ointments or creams to your incision. If you have open wounds you will be instructed how to care for them or a visiting nurse may be arranged for you.  Keep Pravena wound vac on your groin incision until it loses it seal in about 7-10 days.  Once that happens, you can remove and then wash the groin wound with soap and water daily and pat dry. (No tub bath-only shower)  Then put a dry gauze or washcloth in the groin to keep this area dry to help prevent wound infection.  Do this daily and as needed.  Do not use Vaseline or neosporin on your incisions.  Only use soap and water on your incisions and then protect and keep dry.   Diet  Resume your normal diet. There are no special food restrictions following this procedure. A low fat/ low cholesterol diet is recommended for all patients with vascular disease. In order to heal from your surgery,  it is CRITICAL to get adequate nutrition. Your body requires vitamins, minerals, and protein. Vegetables are the best source of vitamins and minerals. Vegetables also provide the perfect balance of protein. Processed food has little nutritional value, so try to avoid this.  Medications  Resume taking all your medications unless your doctor or nurse practitioner tells you not to. If your incision is causing pain, you may take over-the-counter pain relievers such as acetaminophen (Tylenol). If you were prescribed a stronger pain medication, please aware these medication can cause nausea and constipation. Prevent nausea by taking the medication with a snack or meal. Avoid constipation by drinking plenty of fluids and eating foods with high amount of fiber, such as fruits, vegetables, and grains. Take Colase 100 mg (an over-the-counter stool softener) twice a day as needed for constipation. Do not take Tylenol if you are taking prescription pain medications.  Follow Up  Our office will schedule a follow up appointment 2-3 weeks following discharge.  Please call us immediately for any of the following conditions  .Severe or worsening pain in your legs or feet while at rest or while walking .Increase pain, redness, warmth, or drainage (pus) from your incision site(s) Fever of 101 degree or higher The swelling in your leg with the bypass suddenly worsens and becomes more painful than when you were in the hospital If you have been instructed to feel your graft pulse then you should do so every day. If  you can no longer feel this pulse, call the office immediately. Not all patients are given this instruction.  Leg swelling is common after leg bypass surgery.  The swelling should improve over a few months following surgery. To improve the swelling, you may elevate your legs above the level of your heart while you are sitting or resting. Your surgeon or physician assistant may ask you to apply an ACE wrap or  wear compression (TED) stockings to help to reduce swelling.  Reduce your risk of vascular disease  Stop smoking. If you would like help call [facility] at 1-800-QUIT-NOW 234-758-5045) or [place] at 636-287-0263.  Manage your cholesterol Maintain a desired weight Control your diabetes weight Control your diabetes Keep your blood pressure down  If you have any questions, please call the office at [phone]

## 2020-11-06 NOTE — Progress Notes (Signed)
Pt discharged home with wife. IVs and telemetry box removed. Pt discharged with prevena wound vac. Pt received discharge instructions and all questions were answered.

## 2020-11-06 NOTE — Progress Notes (Signed)
OT Cancellation Note  Patient Details Name: Anthony [name] MRN: [id number] DOB: 04/21/1946   Cancelled Treatment:    Reason Eval/Treat Not Completed: OT screened, no needs identified, will sign off  Metta Clines 11/06/2020, 11:34 AM

## 2020-11-06 NOTE — Progress Notes (Addendum)
Progress Note    11/06/2020 7:38 AM 1 Day Post-Op  Subjective: no complaints. Pain well controlled. Has not ambulated. Voiding without difficulty. Tolerating diet   Vitals:   11/06/20 0002 11/06/20 0400  BP: (!) 114/58 (!) 110/58  Pulse: 78 77  Resp: 16 18  Temp: 98.4 F (36.9 C) 98.6 F (37 C)  SpO2: 96% 97%   Physical Exam: Cardiac: regular Lungs: non labored Incisions:  Left groin incision with Prevena wound VAC to suction. No output Extremities:  Lower extremities well perfused and warm Abdomen: soft, non tender, non distended Neurologic:alert and oriented  CBC    Component Value Date/Time   WBC 7.2 11/06/2020 0043   RBC 4.27 11/06/2020 0043   HGB 13.3 11/06/2020 0043   HCT 40.3 11/06/2020 0043   PLT 120 (L) 11/06/2020 0043   MCV 94.4 11/06/2020 0043   MCH 31.1 11/06/2020 0043   MCHC 33.0 11/06/2020 0043   RDW 13.0 11/06/2020 0043   LYMPHSABS 1.3 06/17/2020 1125   MONOABS 0.6 06/17/2020 1125   EOSABS 0.1 06/17/2020 1125   BASOSABS 0.1 06/17/2020 1125    BMET    Component Value Date/Time   NA 136 11/06/2020 0043   K 4.6 11/06/2020 0043   CL 107 11/06/2020 0043   CO2 21 (L) 11/06/2020 0043   GLUCOSE 184 (H) 11/06/2020 0043   BUN 27 (H) 11/06/2020 0043   CREATININE 1.26 (H) 11/06/2020 0043   CALCIUM 8.6 (L) 11/06/2020 0043   GFRNONAA 59 (L) 11/06/2020 0043   GFRAA 59 (L) 09/16/2020 0621    INR    Component Value Date/Time   INR 1.1 11/04/2020 1340     Intake/Output Summary (Last 24 hours) at 11/06/2020 0738 Last data filed at 11/06/2020 0004 Gross per 24 hour  Intake 1444.83 ml  Output 2000 ml  Net -555.17 ml     Assessment/Plan:  74 y.o. male is s/p   #1: Extensive endarterectomy of the left external iliac, common femoral, superficial femoral, and profundofemoral artery                         #2: Bovine pericardial patch angioplasty of the left common femoral and profundofemoral artery                         #3: Separate bovine  pericardial patch angioplasty of the right superficial femoral artery                         #4: Arterial lithotripsy of the left common and left external iliac artery                         #5: Stent, left common and left external iliac artery                         #6: Prevena wound VAC  1 Day Post-Op. Doing well post op. Prevena wound VAc to left groin with good seal. Plavix restarted today. Hemodynamically stable. Afebrile. Patient has not ambulated yet so will get him up this morning. He is otherwise stable for discharge. He will go home on Aspirin, Statin and Plavix. PDMP was reviewed and post operative pain medication sent to patients pharmacy. He will follow up with Dr. [name] in 3-4 weeks   DVT prophylaxis:  Sq heparin   Karoline Caldwell, PA-C [facility] [phone] 11/06/2020 7:38 AM    I agree with the above.  He is stable for discharge.  We will start him on Plavix.  He is already taking aspirin and statin.  Annamarie Major

## 2020-11-11 ENCOUNTER — Other Ambulatory Visit: Payer: Self-pay

## 2020-11-11 ENCOUNTER — Ambulatory Visit (INDEPENDENT_AMBULATORY_CARE_PROVIDER_SITE_OTHER): Payer: Medicare Other | Admitting: Podiatry

## 2020-11-11 ENCOUNTER — Encounter: Payer: Self-pay | Admitting: Podiatry

## 2020-11-11 DIAGNOSIS — B351 Tinea unguium: Secondary | ICD-10-CM | POA: Diagnosis not present

## 2020-11-11 DIAGNOSIS — M79674 Pain in right toe(s): Secondary | ICD-10-CM

## 2020-11-11 DIAGNOSIS — M79675 Pain in left toe(s): Secondary | ICD-10-CM

## 2020-11-11 DIAGNOSIS — L84 Corns and callosities: Secondary | ICD-10-CM

## 2020-11-11 DIAGNOSIS — E1151 Type 2 diabetes mellitus with diabetic peripheral angiopathy without gangrene: Secondary | ICD-10-CM | POA: Diagnosis not present

## 2020-11-13 NOTE — Progress Notes (Signed)
Subjective: Anthony [name] is a 74 y.o. male patient seen today at [facility]. Pt has h/o NIDDM with PAD and corn(s) right 3rd digit and painful mycotic toenails b/l that are difficult to trim. Pain interferes with ambulation. Aggravating factors include wearing enclosed shoe gear. Pain is relieved with periodic professional debridement.   He voices no new pedal problems on today's visit.  Patient Active Problem List   Diagnosis Date Noted  . Femoral artery occlusion, left ([place]) 11/05/2020  . Unilateral primary osteoarthritis, left knee 07/10/2019  . Bleeding external hemorrhoids suspected 06/07/2019  . Long term current use of clopidogrel 06/07/2019  . Unstable angina ([place])   . Bilateral primary osteoarthritis of knee 10/11/2017  . Chronic diastolic heart failure ([place]) 05/06/2015  . Femoral artery stenosis, right ([place]) 10/10/2014  . Preoperative clearance 09/09/2014  . Lumbar spine scoliosis 05/28/2014  . Pain in limb 12/10/2013  . Chest tightness 09/27/2013  . HTN (hypertension) 09/27/2013  . Peripheral vascular disease, unspecified ([place]) 01/08/2013  . PAD (peripheral artery disease) ([place]) 11/13/2012  . Atherosclerosis of native arteries of the extremities with intermittent claudication 11/13/2012  . Bilateral claudication of lower limb ([place]) 10/09/2012  . Coronary atherosclerosis of native coronary artery 08/26/2012  . COPD (chronic obstructive pulmonary disease) ([place]) 08/11/2012  . OSA (obstructive sleep apnea) 08/11/2012  . Diaphragm paralysis 08/11/2012  . Morbid obesity with BMI of 40.0-44.9, adult ([place]) 08/11/2012    Current Outpatient Medications on File Prior to Visit  Medication Sig Dispense Refill  . ANORO ELLIPTA 62.5-25 MCG/INH AEPB USE 1 INHALATION BY MOUTH  DAILY (Patient taking differently: Inhale 1 puff into the lungs daily. ) 180 each 2  . ASPIRIN LOW DOSE 81 MG EC tablet Take 81 mg by mouth every evening.     . BENFOTIAMINE PO Take 250 mg by mouth 2 (two)  times daily.    . bisoprolol (ZEBETA) 5 MG tablet TAKE 1 TABLET BY MOUTH  DAILY (Patient taking differently: Take 5 mg by mouth every evening. ) 90 tablet 3  . carboxymethylcellulose (REFRESH PLUS) 0.5 % SOLN Place 1 drop into both eyes in the morning, at noon, in the evening, and at bedtime.    . carboxymethylcellulose 1 % ophthalmic solution 1 drop 3 (three) times daily.    Marland Kitchen CINNAMON PO Take 1,000 mg by mouth every evening.     . clopidogrel (PLAVIX) 75 MG tablet Take 1 tablet (75 mg total) by mouth daily at 6 (six) AM. 30 tablet 3  . doxazosin (CARDURA) 2 MG tablet Take 2 mg by mouth every evening.     Marland Kitchen doxycycline (VIBRAMYCIN) 50 MG capsule Take 50 mg by mouth daily at 12 noon.     . empagliflozin (JARDIANCE) 10 MG TABS tablet Take 10 mg by mouth daily.    . fish oil-omega-3 fatty acids 1000 MG capsule Take 2 g by mouth daily.     . fluticasone (FLONASE) 50 MCG/ACT nasal spray Place 2 sprays into both nostrils at bedtime as needed for allergies or rhinitis.     . furosemide (LASIX) 20 MG tablet Take 20 mg by mouth daily at 12 noon.     Marland Kitchen GARLIC PO Take 1,610 mg by mouth daily.     Marland Kitchen ibuprofen (ADVIL) 200 MG tablet Take 400 mg by mouth every 6 (six) hours as needed for headache or moderate pain.     . isosorbide mononitrate (IMDUR) 30 MG 24 hr tablet TAKE 1 TABLET BY MOUTH  DAILY (  Patient taking differently: Take 30 mg by mouth daily. ) 90 tablet 3  . L-Lysine 1000 MG TABS Take 1,000 mg by mouth daily.     Marland Kitchen losartan (COZAAR) 100 MG tablet Take 100 mg by mouth daily.    . metFORMIN (GLUCOPHAGE) 1000 MG tablet Take 1,000 mg by mouth 2 (two) times daily with a meal.    . Multiple Vitamin (MULTIVITAMIN) tablet Take 1 tablet by mouth daily.    Marland Kitchen oxyCODONE-acetaminophen (PERCOCET) 5-325 MG tablet Take 1 tablet by mouth every 4 (four) hours as needed for severe pain. 15 tablet 0  . pentoxifylline (TRENTAL) 400 MG CR tablet TAKE 1 TABLET BY MOUTH  TWICE DAILY 180 tablet 3  . potassium chloride SA  (K-DUR,KLOR-CON) 20 MEQ tablet Take 20 mEq by mouth 2 (two) times daily.    Marland Kitchen rOPINIRole (REQUIP) 1 MG tablet Take 1 mg by mouth every evening.     . Semaglutide (OZEMPIC, 1 MG/DOSE, [place]) Inject 1 mg into the skin every Thursday.    . simvastatin (ZOCOR) 40 MG tablet Take 40 mg by mouth every evening.    . SUPER B COMPLEX/C PO Take 1 tablet by mouth daily.    . nitroGLYCERIN (NITROSTAT) 0.4 MG SL tablet Place 1 tablet (0.4 mg total) under the tongue every 5 (five) minutes as needed for chest pain. 90 tablet 3   No current facility-administered medications on file prior to visit.    No Known Allergies  Objective: Physical Exam  General: Well developed, nourished, no acute distress, awake, alert and oriented x 3  Neurovascular Examination: Neurovascular status unchanged b/l. DP pulses faintly palpable b/l. PT pulse palpable RLE; nonpalpable LLE. No pedal hair present. Skin temperature gradient WNL. Sensation diminished with 10 gram monofilament.  Dermatological:  Pedal skin is thin shiny, atrophic b/l lower extremities. No open wounds bilaterally. No interdigital macerations bilaterally. Toenails L 3rd toe, L 4th toe, L 5th toe, R hallux, R 3rd toe, R 4th toe and R 5th toe elongated, discolored, dystrophic, thickened, and crumbly with subungual debris and tenderness to dorsal palpation. Anonychia noted L hallux, L 2nd toe and R 2nd toe. Nailbed(s) epithelialized.  Hyperkeratotic lesion(s) R 3rd toe.  No erythema, no edema, no drainage, no fluctuance.  Musculoskeletal:  Normal muscle strength 5/5 to all lower extremity muscle groups bilaterally. No pain crepitus or joint limitation noted with ROM b/l. Hammertoes noted to the 2-5 bilaterally.  Assessment and Plan:  1. Pain due to onychomycosis of toenails of both feet   2. Corns   3. Type II diabetes mellitus with peripheral circulatory disorder (HCC)    -Examined patient. -No new findings. No new orders. -Continue diabetic foot care  principles. -Toenails L 3rd toe, L 4th toe, L 5th toe, R hallux, R 3rd toe, R 4th toe and R 5th toe debrided in length and girth without iatrogenic bleeding with sterile nail nipper and dremel.  -Corn(s) R 3rd toe pared utilizing sterile scalpel blade without complication or incident. Total number debrided=1. -Patient to report any pedal injuries to medical professional immediately.   Return in about 3 months (around 02/11/2021) for diabetic foot care.  Marzetta Board, DPM

## 2020-11-14 ENCOUNTER — Telehealth: Payer: Self-pay

## 2020-11-14 NOTE — Telephone Encounter (Signed)
Approved for SynviscOne, left knee. [place] Patient will be responsible for 10% OOP. No Co-pay No PA required per [place] with [employer] [place] due [place]. Reference# [id number]  Appt. 11/27/2020 with Dr. Erlinda Hong

## 2020-11-25 ENCOUNTER — Other Ambulatory Visit: Payer: Self-pay

## 2020-11-25 DIAGNOSIS — I70202 Unspecified atherosclerosis of native arteries of extremities, left leg: Secondary | ICD-10-CM

## 2020-11-25 DIAGNOSIS — I739 Peripheral vascular disease, unspecified: Secondary | ICD-10-CM

## 2020-11-26 ENCOUNTER — Ambulatory Visit: Payer: Medicare Other | Admitting: Orthopaedic Surgery

## 2020-11-27 ENCOUNTER — Ambulatory Visit (INDEPENDENT_AMBULATORY_CARE_PROVIDER_SITE_OTHER): Payer: Medicare Other | Admitting: Orthopaedic Surgery

## 2020-11-27 ENCOUNTER — Encounter: Payer: Self-pay | Admitting: Orthopaedic Surgery

## 2020-11-27 VITALS — Ht 69.0 in | Wt 230.0 lb

## 2020-11-27 DIAGNOSIS — M1712 Unilateral primary osteoarthritis, left knee: Secondary | ICD-10-CM | POA: Diagnosis not present

## 2020-11-27 MED ORDER — HYLAN G-F 20 48 MG/6ML IX SOSY
48.0000 mg | PREFILLED_SYRINGE | INTRA_ARTICULAR | Status: AC | PRN
  Administered 2020-11-27: 48 mg via INTRA_ARTICULAR

## 2020-11-27 NOTE — Progress Notes (Signed)
   Procedure Note  Patient: Anthony [name]             Date of Birth: 05-15-1946           MRN: [id number]             Visit Date: 11/27/2020  Procedures: Visit Diagnoses:  1. Primary osteoarthritis of left knee     Large Joint Inj: L knee on 11/27/2020 8:30 AM Indications: pain Details: 22 G needle  Arthrogram: No  Medications: 48 mg Hylan 48 MG/6ML Outcome: tolerated well, no immediate complications Patient was prepped and draped in the usual sterile fashion.

## 2020-12-01 ENCOUNTER — Ambulatory Visit (HOSPITAL_COMMUNITY)
Admission: RE | Admit: 2020-12-01 | Discharge: 2020-12-01 | Disposition: A | Discharge status: Home / Self Care | Payer: Medicare Other | Source: Ambulatory Visit | Attending: Surgery | Admitting: Surgery

## 2020-12-01 ENCOUNTER — Ambulatory Visit (INDEPENDENT_AMBULATORY_CARE_PROVIDER_SITE_OTHER)
Admit: 2020-12-01 | Discharge: 2020-12-01 | Disposition: A | Discharge status: Home / Self Care | Payer: Medicare Other | Attending: Surgery | Admitting: Surgery

## 2020-12-01 ENCOUNTER — Ambulatory Visit (INDEPENDENT_AMBULATORY_CARE_PROVIDER_SITE_OTHER): Payer: Medicare Other | Admitting: Surgery

## 2020-12-01 ENCOUNTER — Other Ambulatory Visit: Payer: Self-pay

## 2020-12-01 VITALS — BP 118/74 | HR 61 | Temp 98.1°F | Resp 20 | Ht 69.0 in | Wt 228.5 lb

## 2020-12-01 DIAGNOSIS — I70202 Unspecified atherosclerosis of native arteries of extremities, left leg: Secondary | ICD-10-CM | POA: Diagnosis present

## 2020-12-01 DIAGNOSIS — I739 Peripheral vascular disease, unspecified: Secondary | ICD-10-CM | POA: Diagnosis present

## 2020-12-01 DIAGNOSIS — I70213 Atherosclerosis of native arteries of extremities with intermittent claudication, bilateral legs: Secondary | ICD-10-CM

## 2020-12-01 NOTE — Progress Notes (Signed)
Patient name: DAVI KROON MRN: [id number] DOB: 10-29-1946 Sex: male  REASON FOR VISIT:     post op  HISTORY OF PRESENT ILLNESS:   Jakylan Ron Gordonis a 74 y.o.malewho Is recently status post left iliofemoral endarterectomy with bovine patch angioplasty x 2 and lithotripsy and stenting of his left iliac on 11-05-2020.  This was done for severe claudication and an occluded left femoral artery.  He reports significant improvement in his walking.  He is status post right external iliac, common femoral, superficial femoral, and profunda femoral endarterectomy with bovine pericardial patch and plasty on 10/10/2014. This was done for lifestyle limiting claudication which failed conservative management. His postoperative course was uncomplicated. His quality of life  dramatically improved.   I have not seen him for 2 years.  He reports a deterioration in his quality of life and limited mobility.   The patient suffers from diabetes. His most recent hemoglobin A1c is 7.1. He is a former smoker. He takes a statin for hypercholesterolemia. He is on an ARB for hypertension.   CURRENT MEDICATIONS:    Current Outpatient Medications  Medication Sig Dispense Refill  . ANORO ELLIPTA 62.5-25 MCG/INH AEPB USE 1 INHALATION BY MOUTH  DAILY (Patient taking differently: Inhale 1 puff into the lungs daily. ) 180 each 2  . ASPIRIN LOW DOSE 81 MG EC tablet Take 81 mg by mouth every evening.     . BENFOTIAMINE PO Take 250 mg by mouth 2 (two) times daily.    . bisoprolol (ZEBETA) 5 MG tablet TAKE 1 TABLET BY MOUTH  DAILY (Patient taking differently: Take 5 mg by mouth every evening. ) 90 tablet 3  . carboxymethylcellulose (REFRESH PLUS) 0.5 % SOLN Place 1 drop into both eyes in the morning, at noon, in the evening, and at bedtime.    Marland Kitchen CINNAMON PO Take 1,000 mg by mouth every evening.     . clopidogrel (PLAVIX) 75 MG tablet Take 1 tablet (75 mg total) by mouth daily at  6 (six) AM. 30 tablet 3  . doxazosin (CARDURA) 2 MG tablet Take 2 mg by mouth every evening.     Marland Kitchen doxycycline (VIBRAMYCIN) 50 MG capsule Take 50 mg by mouth daily at 12 noon.     . empagliflozin (JARDIANCE) 10 MG TABS tablet Take 10 mg by mouth daily.    . fish oil-omega-3 fatty acids 1000 MG capsule Take 2 g by mouth daily.     . fluticasone (FLONASE) 50 MCG/ACT nasal spray Place 2 sprays into both nostrils at bedtime as needed for allergies or rhinitis.     . furosemide (LASIX) 20 MG tablet Take 20 mg by mouth daily at 12 noon.     Marland Kitchen GARLIC PO Take 5,638 mg by mouth daily.     Marland Kitchen ibuprofen (ADVIL) 200 MG tablet Take 400 mg by mouth every 6 (six) hours as needed for headache or moderate pain.     . isosorbide mononitrate (IMDUR) 30 MG 24 hr tablet TAKE 1 TABLET BY MOUTH  DAILY (Patient taking differently: Take 30 mg by mouth daily. ) 90 tablet 3  . L-Lysine 1000 MG TABS Take 1,000 mg by mouth daily.     Marland Kitchen losartan (COZAAR) 100 MG tablet Take 100 mg by mouth daily.    . metFORMIN (GLUCOPHAGE) 1000 MG tablet Take 1,000 mg by mouth 2 (two) times daily with a meal.    . Multiple Vitamin (MULTIVITAMIN) tablet Take 1 tablet by mouth daily.    Marland Kitchen  nitroGLYCERIN (NITROSTAT) 0.4 MG SL tablet Place 1 tablet (0.4 mg total) under the tongue every 5 (five) minutes as needed for chest pain. 90 tablet 3  . oxyCODONE-acetaminophen (PERCOCET) 5-325 MG tablet Take 1 tablet by mouth every 4 (four) hours as needed for severe pain. 15 tablet 0  . pentoxifylline (TRENTAL) 400 MG CR tablet TAKE 1 TABLET BY MOUTH  TWICE DAILY 180 tablet 3  . potassium chloride SA (K-DUR,KLOR-CON) 20 MEQ tablet Take 20 mEq by mouth 2 (two) times daily.    Marland Kitchen rOPINIRole (REQUIP) 1 MG tablet Take 1 mg by mouth every evening.     . Semaglutide (OZEMPIC, 1 MG/DOSE, [place]) Inject 1 mg into the skin every Thursday.    . simvastatin (ZOCOR) 40 MG tablet Take 40 mg by mouth every evening.    . SUPER B COMPLEX/C PO Take 1 tablet by mouth daily.      No current facility-administered medications for this visit.    REVIEW OF SYSTEMS:   [X]  denotes positive finding, [ ]  denotes negative finding Cardiac  Comments:  Chest pain or chest pressure:    Shortness of breath upon exertion:    Short of breath when lying flat:    Irregular heart rhythm:    Constitutional    Fever or chills:      PHYSICAL EXAM:   There were no vitals filed for this visit.  GENERAL: The patient is a well-nourished male, in no acute distress. The vital signs are documented above. CARDIOVASCULAR: There is a regular rate and rhythm. PULMONARY: Non-labored respirations Palpable femoral pulses.  Left groin incision is healed nicely  STUDIES:    ABI/TBIToday's ABIToday's TBIPrevious ABIPrevious TBI  +-------+-----------+-----------+------------+------------+  Right 0.64    0.42    0.70    0.48      +-------+-----------+-----------+------------+------------+  Left  0.84    0.61    0.63    0.37    MEDICAL ISSUES:   Status post left iliofemoral endarterectomy with left iliac stent: His wound has healed nicely.  His symptoms have resolved.  He will follow-up in 6 months with repeat bilateral aortoiliac duplex and ABIs.  Leia Alf, MD, FACS [facility] [phone] Pager [phone]

## 2020-12-02 ENCOUNTER — Other Ambulatory Visit: Payer: Self-pay

## 2020-12-02 DIAGNOSIS — I70213 Atherosclerosis of native arteries of extremities with intermittent claudication, bilateral legs: Secondary | ICD-10-CM

## 2020-12-03 ENCOUNTER — Ambulatory Visit: Payer: Medicare Other | Admitting: Gastroenterology

## 2021-04-26 ENCOUNTER — Other Ambulatory Visit: Payer: Self-pay | Admitting: Pulmonary Disease

## 2021-04-28 ENCOUNTER — Ambulatory Visit: Payer: Medicare Other | Admitting: Podiatry

## 2021-05-06 ENCOUNTER — Other Ambulatory Visit: Payer: Self-pay

## 2021-05-06 DIAGNOSIS — I739 Peripheral vascular disease, unspecified: Secondary | ICD-10-CM

## 2021-05-21 ENCOUNTER — Other Ambulatory Visit: Payer: Self-pay

## 2021-05-21 ENCOUNTER — Ambulatory Visit (HOSPITAL_COMMUNITY)
Admission: RE | Admit: 2021-05-21 | Discharge: 2021-05-21 | Disposition: A | Discharge status: Home / Self Care | Payer: Medicare Other | Source: Ambulatory Visit | Attending: Internal Medicine | Admitting: Internal Medicine

## 2021-05-21 ENCOUNTER — Encounter (HOSPITAL_COMMUNITY): Payer: Self-pay | Admitting: Internal Medicine

## 2021-05-21 VITALS — BP 130/80 | HR 65 | Wt 229.4 lb

## 2021-05-21 DIAGNOSIS — Z7982 Long term (current) use of aspirin: Secondary | ICD-10-CM | POA: Insufficient documentation

## 2021-05-21 DIAGNOSIS — I2510 Atherosclerotic heart disease of native coronary artery without angina pectoris: Secondary | ICD-10-CM | POA: Diagnosis not present

## 2021-05-21 DIAGNOSIS — E1151 Type 2 diabetes mellitus with diabetic peripheral angiopathy without gangrene: Secondary | ICD-10-CM | POA: Insufficient documentation

## 2021-05-21 DIAGNOSIS — Z7984 Long term (current) use of oral hypoglycemic drugs: Secondary | ICD-10-CM | POA: Insufficient documentation

## 2021-05-21 DIAGNOSIS — Z01810 Encounter for preprocedural cardiovascular examination: Secondary | ICD-10-CM | POA: Diagnosis not present

## 2021-05-21 DIAGNOSIS — E785 Hyperlipidemia, unspecified: Secondary | ICD-10-CM | POA: Insufficient documentation

## 2021-05-21 DIAGNOSIS — Z955 Presence of coronary angioplasty implant and graft: Secondary | ICD-10-CM | POA: Diagnosis not present

## 2021-05-21 DIAGNOSIS — Z8249 Family history of ischemic heart disease and other diseases of the circulatory system: Secondary | ICD-10-CM | POA: Diagnosis not present

## 2021-05-21 DIAGNOSIS — J449 Chronic obstructive pulmonary disease, unspecified: Secondary | ICD-10-CM | POA: Diagnosis not present

## 2021-05-21 DIAGNOSIS — Z79899 Other long term (current) drug therapy: Secondary | ICD-10-CM | POA: Diagnosis not present

## 2021-05-21 DIAGNOSIS — G4733 Obstructive sleep apnea (adult) (pediatric): Secondary | ICD-10-CM | POA: Insufficient documentation

## 2021-05-21 DIAGNOSIS — I5022 Chronic systolic (congestive) heart failure: Secondary | ICD-10-CM

## 2021-05-21 DIAGNOSIS — I110 Hypertensive heart disease with heart failure: Secondary | ICD-10-CM | POA: Insufficient documentation

## 2021-05-21 DIAGNOSIS — Z87891 Personal history of nicotine dependence: Secondary | ICD-10-CM | POA: Diagnosis not present

## 2021-05-21 DIAGNOSIS — I5032 Chronic diastolic (congestive) heart failure: Secondary | ICD-10-CM | POA: Insufficient documentation

## 2021-05-21 DIAGNOSIS — I739 Peripheral vascular disease, unspecified: Secondary | ICD-10-CM

## 2021-05-21 DIAGNOSIS — Z9989 Dependence on other enabling machines and devices: Secondary | ICD-10-CM | POA: Diagnosis not present

## 2021-05-21 NOTE — Progress Notes (Signed)
Acute Heart Failure Clinic Note    Date:  05/21/2021   ID:  Anthony [name], DOB 1946/08/29, MRN [id number]  Location: Home  Provider location: [facility] Type of Visit: Established patient  PCP:  [name], MD  Cardiologist:  None Primary HF: Kenyatta Keidel  Chief Complaint: Heart Failure follow-up   History of Present Illness:  Anthony [name] is a 75 y.o. male with h/o obesity, HTN, HL, severe osteoarthritis, DM2 (diagnosed 2009), OSA (intolerant of CPAP), former smoker (quit 1989), CAD s/p stent 2001 and PAD. He is the father-in-law of Dr. [name] in Interventional Radiology.    Had cath in [place] in 2001. Was told he had 70% blockage in one artery and the one in the back was totally blocked. Eventually underwent stenting.   Has severe PAD R>L followed by Dr. [name]. Now status post right external iliac, common femoral, superficial femoral endarterectomy with bovine pericardial patch angioplasty on 10/10/2014  Saw Dr. Halford Chessman and placed on home O2 but now weaned off with inhalers. Checks sats with pulse oximeter    Echo 8/20 EF 50% RV ok. Personally reviewed  Was having more SOB and some CP. Underwent repeat cath 05/11/19  1. Left dominant coronary system with 2V CAD 2. Small dominant RCA with chronic proximal total occlusion 3. Widely patent dominant LCX 4. High grade 95% proximal LAD likely within or just prior to previous stent. Probable chronic occlusion of distal LAD but not seen well -> PCI/DES to prox LAD and POBA of D1 5. LVEF 60-65% with normal filling pressures 6. Normal right heart pressures   11/21 Had extensive endarterectomy of left external iliac, CFA and SFA with patch angioplasty and stenting.   Here for surgical clearance. Found to have draining rectal lesion (? hidrenadrenitis vs anal fissure). Says he continues to have some mild SOB with exertion. Unchanged. Has been relatively active fertilizing the lawn and walking.  Able to go up and down steps. No angina. Mild LLE edema    PFTs 12/20 FEV1 2.26L (76%) FVC 3.1L (76%) DLCO 70%    CXR with elevated R hemidiaphragm. Sleep study with severe OS - AHI 39.      Past Medical History:  Diagnosis Date  . Arthritis   . CAD (coronary artery disease)    stent placed 06/02/2000  . Cataract    had surgery  . Congestive heart failure ([place])   . COPD (chronic obstructive pulmonary disease) ([place]) 08/11/2012  . Diabetes mellitus ([place])   . Hyperlipidemia   . Hypertension   . Lumbar stenosis   . Neuromuscular disorder (H[facility])    neuropathy in feet  . OSA (obstructive sleep apnea) 08/11/2012   cpcap  . Peripheral vascular disease ([place])   . Pneumonia 1999  . Rosacea   . Shortness of breath    with exertion  . Sleep apnea    Past Surgical History:  Procedure Laterality Date  . ABDOMINAL ANGIOGRAM  Dec. 4, 2013  . ABDOMINAL AORTAGRAM N/A 11/29/2012   Procedure: ABDOMINAL Maxcine Ham;  Surgeon: Serafina Mitchell, MD;  Location: [facility] CATH LAB;  Service: Cardiovascular;  Laterality: N/A;  . ABDOMINAL AORTAGRAM N/A 09/03/2014   Procedure: ABDOMINAL AORTAGRAM;  Surgeon: Serafina Mitchell, MD;  Location: [facility] CATH LAB;  Service: Cardiovascular;  Laterality: N/A;  . ABDOMINAL AORTOGRAM W/LOWER EXTREMITY N/A 09/16/2020   Procedure: ABDOMINAL AORTOGRAM W/LOWER EXTREMITY;  Surgeon: Serafina Mitchell, MD;  Location: [place] CV LAB;  Service:  Cardiovascular;  Laterality: N/A;  . ANGIOPLASTY  06/02/2000   Stent  . ANTERIOR LATERAL LUMBAR FUSION 4 LEVELS Right 05/28/2014   Procedure: Right L4-5 L3-4 L2-3, L1-2  Anterior lateral lumbar fusion with percutaneaous pedicle screws. Lumbar four/five, three/four, two/three and possible two/one;  Surgeon: Erline Levine, MD;  Location: [place] NEURO ORS;  Service: Neurosurgery;  Laterality: Right;  Lumbar One-Five Fusion with Percutaneous Screws  . BACK SURGERY    . [place]   right foot  . CARDIAC CATHETERIZATION    . CARPAL  TUNNEL RELEASE  09/30/2006   right wrist  . CERVICAL FUSION  Nov 2002  . COLONOSCOPY  2017  . COLONOSCOPY  08/26/2020  . COLONOSCOPY W/ BIOPSIES AND POLYPECTOMY     benign  . CORONARY ANGIOPLASTY    . CORONARY STENT INTERVENTION N/A 05/11/2019   Procedure: CORONARY STENT INTERVENTION;  Surgeon: Burnell Blanks, MD;  Location: [place] CV LAB;  Service: Cardiovascular;  Laterality: N/A;  . ENDARTERECTOMY FEMORAL Right 10/10/2014   Procedure: RIGHT FEMORAL ARTERY ENDARTERECTOMY  WITH VASCU GUARD PATCH ANGIOPLASTY;  Surgeon: Serafina Mitchell, MD;  Location: [place];  Service: Vascular;  Laterality: Right;  . ENDARTERECTOMY FEMORAL Left 11/05/2020   Procedure: LEFT FEMORAL ENDARTERECTOMY WITH PATCH ANGIOPLASTY;  Surgeon: Serafina Mitchell, MD;  Location: [place];  Service: Vascular;  Laterality: Left;  . EYE SURGERY Bilateral 2014   cataracts  . FEMORAL ENDARTERECTOMY Left 11/05/2020   LEFT FEMORAL ENDARTERECTOMY WITH PATCH ANGIOPLASTY (Left   . HAMMER TOE SURGERY  June 2007 & August 2008   left foot  . HAND ARTHROPLASTY  Oct 2010   right thumb  . Heart catherization  03/26/1999  . INSERTION OF ILIAC STENT Left 11/05/2020    INSERTION OF ILIAC STENT (Left )  . INSERTION OF ILIAC STENT Left 11/05/2020   Procedure: INSERTION OF ILIAC STENT;  Surgeon: Serafina Mitchell, MD;  Location: [place];  Service: Vascular;  Laterality: Left;  . INTRAVASCULAR LITHOTRIPSY Left 11/05/2020   Procedure: INTRAVASCULAR LITHOTRIPSY;  Surgeon: Serafina Mitchell, MD;  Location: [facility] OR;  Service: Vascular;  Laterality: Left;  shockwave  . [place] SURGERY  Dec 1997 & Ampril 2005  . LUMBAR PERCUTANEOUS PEDICLE SCREW 4 LEVEL N/A 05/28/2014   Procedure: LUMBAR PERCUTANEOUS PEDICLE SCREW 4 LEVEL;  Surgeon: Erline Levine, MD;  Location: [place] NEURO ORS;  Service: Neurosurgery;  Laterality: N/A;  . [place]   right foot  . PERIPHERAL VASCULAR CATHETERIZATION N/A 12/07/2016   Procedure: Abdominal Aortogram  w/Lower Extremity;  Surgeon: Serafina Mitchell, MD;  Location: [place] CV LAB;  Service: Cardiovascular;  Laterality: N/A;  . RIGHT/LEFT HEART CATH AND CORONARY ANGIOGRAPHY N/A 05/11/2019   Procedure: RIGHT/LEFT HEART CATH AND CORONARY ANGIOGRAPHY;  Surgeon: Jolaine Artist, MD;  Location: [place] CV LAB;  Service: Cardiovascular;  Laterality: N/A;  . SPINE SURGERY  04/21/2004   Lower back disk gurgery- Lumbar stenosis  . TONSILLECTOMY  1965     Current Outpatient Medications  Medication Sig Dispense Refill  . ASPIRIN LOW DOSE 81 MG EC tablet Take 81 mg by mouth every evening.     . BENFOTIAMINE PO Take 250 mg by mouth 2 (two) times daily.    . bisoprolol (ZEBETA) 5 MG tablet TAKE 1 TABLET BY MOUTH  DAILY 90 tablet 3  . carboxymethylcellulose (REFRESH PLUS) 0.5 % SOLN Place 1 drop into both eyes in the morning, at noon, in the evening,  and at bedtime.    Marland Kitchen CINNAMON PO Take 1,000 mg by mouth every evening.     Marland Kitchen doxazosin (CARDURA) 2 MG tablet Take 2 mg by mouth every evening.     Marland Kitchen doxycycline (VIBRAMYCIN) 50 MG capsule Take 50 mg by mouth daily at 12 noon.     . empagliflozin (JARDIANCE) 10 MG TABS tablet Take 10 mg by mouth daily.    . fish oil-omega-3 fatty acids 1000 MG capsule Take 2 g by mouth daily.    . fluticasone (FLONASE) 50 MCG/ACT nasal spray Place 2 sprays into both nostrils at bedtime as needed for allergies or rhinitis.     . furosemide (LASIX) 20 MG tablet Take 20 mg by mouth daily at 12 noon.     Marland Kitchen GARLIC PO Take 3,016 mg by mouth daily.     Marland Kitchen ibuprofen (ADVIL) 200 MG tablet Take 400 mg by mouth every 6 (six) hours as needed for headache or moderate pain.     . isosorbide mononitrate (IMDUR) 30 MG 24 hr tablet TAKE 1 TABLET BY MOUTH  DAILY 90 tablet 3  . L-Lysine 1000 MG TABS Take 1,000 mg by mouth daily.    Marland Kitchen losartan (COZAAR) 100 MG tablet Take 100 mg by mouth daily.    . metFORMIN (GLUCOPHAGE) 1000 MG tablet Take 1,000 mg by mouth 2 (two) times daily with a  meal.    . Multiple Vitamin (MULTIVITAMIN) tablet Take 1 tablet by mouth daily.    . nitroGLYCERIN (NITROSTAT) 0.4 MG SL tablet Place 1 tablet (0.4 mg total) under the tongue every 5 (five) minutes as needed for chest pain. 90 tablet 3  . pentoxifylline (TRENTAL) 400 MG CR tablet TAKE 1 TABLET BY MOUTH  TWICE DAILY 180 tablet 3  . potassium chloride SA (K-DUR,KLOR-CON) 20 MEQ tablet Take 20 mEq by mouth 2 (two) times daily.    Marland Kitchen rOPINIRole (REQUIP) 1 MG tablet Take 1 mg by mouth every evening.     . Semaglutide (OZEMPIC, 1 MG/DOSE, [place]) Inject 1 mg into the skin every Thursday.    . simvastatin (ZOCOR) 40 MG tablet Take 40 mg by mouth every evening.    . SUPER B COMPLEX/C PO Take 1 tablet by mouth daily.    Marland Kitchen umeclidinium-vilanterol (ANORO ELLIPTA) 62.5-25 MCG/INH AEPB Inhale 1 puff into the lungs daily. 1 each 2   No current facility-administered medications for this encounter.    Allergies:   Patient has no known allergies.   Social History:  The patient  reports that he quit smoking about 33 years ago. His smoking use included cigarettes. He has a 56.00 pack-year smoking history. He has never used smokeless tobacco. He reports current alcohol use. He reports that he does not use drugs.   Family History:  The patient's family history includes Cancer in his father and mother; Deep vein thrombosis in his mother; Diabetes in his mother; Heart attack in his father; Heart disease (age of onset: 2) in his father; Hyperlipidemia in his father and mother; Hypertension in his father and mother.   ROS:  Please see the history of present illness.   All other systems are personally reviewed and negative.   Exam:  General:  Well appearing. No resp difficulty HEENT: normal Neck: supple. no JVD. Carotids 2+ bilat; no bruits. No lymphadenopathy or thryomegaly appreciated. Cor: PMI nondisplaced. Regular rate & rhythm. No rubs, gallops or murmurs. Lungs: decreased throughout Abdomen: obese soft,  nontender, nondistended. No hepatosplenomegaly. No bruits or  masses. Good bowel sounds. Extremities: no cyanosis, clubbing, rash, edema Neuro: alert & orientedx3, cranial nerves grossly intact. moves all 4 extremities w/o difficulty. Affect pleasant   ECG: NSR 68 anterolateral Qs Personally reviewed   Recent Labs: 11/04/2020: ALT 22 11/06/2020: BUN 27; Creatinine, Ser 1.26; Hemoglobin 13.3; Platelets 120; Potassium 4.6; Sodium 136  Personally reviewed   Wt Readings from Last 3 Encounters:  05/21/21 104.1 kg (229 lb 6.4 oz)  12/01/20 103.6 kg (228 lb 8 oz)  11/27/20 104.3 kg (230 lb)      ASSESSMENT AND PLAN:  1) Pre-operative surgical stratification - he is at moderate risk for peri-op CV complications. Can proceed to surgery without further testing - would avoid general anesthesia if at all possible to minimize risk. If possible (but ok to proceed with GA if needed)  2) CAD - Last cath 05/11/19 with PCI LAD with DES and POBA of D1 - Cont ASA/statin - No s/s of angina - Lipids followed by Dr. Virgina Jock. Goal LDL < 70  3) Chronic Diastolic HF-   - EF 79-39% with mild RV dysfunction on ECHO 5/16. - Echo 8/20 EF 50% RV ok. Personally reviewed - NYHA III, Volume status looks good todayand on RHC in 5/20 - Continue lasix 20 mg BID  4) PAD - s/p R femoral artery endarterectomy in 2015. Followed by [facility]. Per Dr [name] - 11/21 Had extensive endarterectomy of left external iliac, CFA and SFA with patch angioplasty and stenting.   5) HTN - Blood pressure well controlled. Continue current regimen.  6) OSA on CPAP - Is compliant with CPAP  7) Hyperlipidemia - Followed by Dr. Virgina Jock. Continue statin. Goal LDL < 70  8) COPD - Follows with Dr Halford Chessman   Signed, Glori Bickers, MD  05/21/2021 12:38 PM  [facility] [place] [address] [facility] [place] [address] [phone] (office) [phone] (fax)

## 2021-05-29 ENCOUNTER — Telehealth: Payer: Self-pay | Admitting: *Deleted

## 2021-05-29 NOTE — Telephone Encounter (Signed)
Our office receive a duplicate clearance request from Dr. Berneice Heinrich office asking for instructions on Plavix. I s/w Dr. [name] with our [facility] who cleared the pt 05/21/21. For clearance on Plavix please reach out to Dr. [name]. See message below from Dr. [name].  Bensimhon, Shaune Pascal, MD  Michae Kava, CMA Cc: Scarlette Calico, RN He is on plavix for his PAD and not CAD. He had recent stent with Dr [name] so the Plavix is really managed by him.

## 2021-06-04 ENCOUNTER — Other Ambulatory Visit: Payer: Self-pay

## 2021-06-05 ENCOUNTER — Other Ambulatory Visit: Payer: Self-pay

## 2021-06-08 ENCOUNTER — Other Ambulatory Visit: Payer: Self-pay

## 2021-06-08 ENCOUNTER — Ambulatory Visit (INDEPENDENT_AMBULATORY_CARE_PROVIDER_SITE_OTHER)
Admission: RE | Admit: 2021-06-08 | Discharge: 2021-06-08 | Disposition: A | Discharge status: Home / Self Care | Payer: Medicare Other | Source: Ambulatory Visit | Attending: Surgery | Admitting: Surgery

## 2021-06-08 ENCOUNTER — Ambulatory Visit (HOSPITAL_COMMUNITY)
Admission: RE | Admit: 2021-06-08 | Discharge: 2021-06-08 | Disposition: A | Discharge status: Home / Self Care | Payer: Medicare Other | Source: Ambulatory Visit | Attending: Surgery | Admitting: Surgery

## 2021-06-08 ENCOUNTER — Ambulatory Visit (INDEPENDENT_AMBULATORY_CARE_PROVIDER_SITE_OTHER): Payer: Medicare Other | Admitting: Surgery

## 2021-06-08 ENCOUNTER — Encounter: Payer: Self-pay | Admitting: Surgery

## 2021-06-08 VITALS — BP 118/72 | HR 61 | Temp 98.2°F | Resp 20 | Ht 69.0 in | Wt 224.0 lb

## 2021-06-08 DIAGNOSIS — I739 Peripheral vascular disease, unspecified: Secondary | ICD-10-CM | POA: Insufficient documentation

## 2021-06-08 DIAGNOSIS — I70213 Atherosclerosis of native arteries of extremities with intermittent claudication, bilateral legs: Secondary | ICD-10-CM

## 2021-06-08 NOTE — Progress Notes (Signed)
[facility] [place]  Patient name: Anthony [name] MRN: [id number] DOB: 1946/12/03 Sex: male   REASON FOR VISIT:    Follow up  HISOTRY OF PRESENT ILLNESS:   Anthony [name] is a 75 y.o. male who Is status post left iliofemoral endarterectomy with bovine patch angioplasty x 2 and lithotripsy and stenting of his left iliac on 11-05-2020.  This was done for severe claudication and an occluded left femoral artery and severe claudication   He is also status post right external iliac, common femoral, superficial femoral, and profunda femoral endarterectomy with bovine pericardial patch and plasty on 10/10/2014.  This was done for lifestyle limiting claudication which failed conservative management.   He is not having any significant difficulty with ambulation.  There is a little bit of swelling in the left leg.  He has no ulcers.     The patient suffers from diabetes.   He is a former smoker.  He takes a statin for hypercholesterolemia.  He is on an ARB for hypertension.    PAST MEDICAL HISTORY:   Past Medical History:  Diagnosis Date   Arthritis    CAD (coronary artery disease)    stent placed 06/02/2000   Cataract    had surgery   Congestive heart failure (HCC)    COPD (chronic obstructive pulmonary disease) ([place]) 08/11/2012   Diabetes mellitus (HCC)    Hyperlipidemia    Hypertension    Lumbar stenosis    Neuromuscular disorder (HCC)    neuropathy in feet   OSA (obstructive sleep apnea) 08/11/2012   cpcap   Peripheral vascular disease (H[facility])    Pneumonia 1999   Rosacea    Shortness of breath    with exertion   Sleep apnea      FAMILY HISTORY:   Family History  Problem Relation Age of Onset   Heart disease Father 59   Cancer Father    Hyperlipidemia Father    Hypertension Father    Heart attack Father    Cancer Mother    Deep vein thrombosis Mother        Varicose veins   Diabetes Mother    Hyperlipidemia  Mother    Hypertension Mother     SOCIAL HISTORY:   Social History   Tobacco Use   Smoking status: Former    Packs/day: 2.00    Years: 28.00    Pack years: 56.00    Types: Cigarettes    Quit date: 08/03/1987    Years since quitting: 33.8   Smokeless tobacco: Never  Substance Use Topics   Alcohol use: Yes    Comment: weekly     ALLERGIES:   Allergies  Allergen Reactions   Spiriva Respimat [Tiotropium Bromide Monohydrate]     Other reaction(s): Throat irritation     CURRENT MEDICATIONS:   Current Outpatient Medications  Medication Sig Dispense Refill   ASPIRIN LOW DOSE 81 MG EC tablet Take 81 mg by mouth every evening.      BENFOTIAMINE PO Take 250 mg by mouth 2 (two) times daily.     bisoprolol (ZEBETA) 5 MG tablet TAKE 1 TABLET BY MOUTH  DAILY 90 tablet 3   carboxymethylcellulose (REFRESH PLUS) 0.5 % SOLN Place 1 drop into both eyes in the morning, at noon, in the evening, and at bedtime.     CINNAMON PO Take 1,000 mg by mouth every evening.      doxazosin (CARDURA) 2 MG tablet Take 2 mg by mouth  every evening.      doxycycline (VIBRAMYCIN) 50 MG capsule Take 50 mg by mouth daily at 12 noon.      empagliflozin (JARDIANCE) 10 MG TABS tablet Take 10 mg by mouth daily.     fish oil-omega-3 fatty acids 1000 MG capsule Take 2 g by mouth daily.     fluticasone (FLONASE) 50 MCG/ACT nasal spray Place 2 sprays into both nostrils at bedtime as needed for allergies or rhinitis.      furosemide (LASIX) 20 MG tablet Take 20 mg by mouth daily at 12 noon.      GARLIC PO Take 0,630 mg by mouth daily.      ibuprofen (ADVIL) 200 MG tablet Take 400 mg by mouth every 6 (six) hours as needed for headache or moderate pain.      isosorbide mononitrate (IMDUR) 30 MG 24 hr tablet TAKE 1 TABLET BY MOUTH  DAILY 90 tablet 3   L-Lysine 1000 MG TABS Take 1,000 mg by mouth daily.     losartan (COZAAR) 100 MG tablet Take 100 mg by mouth daily.     metFORMIN (GLUCOPHAGE) 1000 MG tablet Take 1,000 mg  by mouth 2 (two) times daily with a meal.     Multiple Vitamin (MULTIVITAMIN) tablet Take 1 tablet by mouth daily.     pentoxifylline (TRENTAL) 400 MG CR tablet TAKE 1 TABLET BY MOUTH  TWICE DAILY 180 tablet 3   potassium chloride SA (K-DUR,KLOR-CON) 20 MEQ tablet Take 20 mEq by mouth 2 (two) times daily.     rOPINIRole (REQUIP) 1 MG tablet Take 1 mg by mouth every evening.      Semaglutide (OZEMPIC, 1 MG/DOSE, [place]) Inject 1 mg into the skin every Thursday.     simvastatin (ZOCOR) 40 MG tablet Take 40 mg by mouth every evening.     SUPER B COMPLEX/C PO Take 1 tablet by mouth daily.     umeclidinium-vilanterol (ANORO ELLIPTA) 62.5-25 MCG/INH AEPB Inhale 1 puff into the lungs daily. 1 each 2   nitroGLYCERIN (NITROSTAT) 0.4 MG SL tablet Place 1 tablet (0.4 mg total) under the tongue every 5 (five) minutes as needed for chest pain. 90 tablet 3   No current facility-administered medications for this visit.    REVIEW OF SYSTEMS:   X denotes positive finding, denotes negative finding Cardiac  Comments:  Chest pain or chest pressure:    Shortness of breath upon exertion:    Short of breath when lying flat:    Irregular heart rhythm:        Vascular    Pain in calf, thigh, or hip brought on by ambulation:    Pain in feet at night that wakes you up from your sleep:     Blood clot in your veins:    Leg swelling:         Pulmonary    Oxygen at home:    Productive cough:     Wheezing:         Neurologic    Sudden weakness in arms or legs:     Sudden numbness in arms or legs:     Sudden onset of difficulty speaking or slurred speech:    Temporary loss of vision in one eye:     Problems with dizziness:         Gastrointestinal    Blood in stool:     Vomited blood:         Genitourinary    Burning when urinating:  Blood in urine:        Psychiatric    Major depression:         Hematologic    Bleeding problems:    Problems with blood clotting too easily:        Skin     Rashes or ulcers:        Constitutional    Fever or chills:      PHYSICAL EXAM:   Vitals:   06/08/21 1004  BP: 118/72  Pulse: 61  Resp: 20  Temp: 98.2 F (36.8 C)  SpO2: 94%  Weight: 224 lb (101.6 kg)  Height: 5\' 9"  (1.753 m)    GENERAL: The patient is a well-nourished male, in no acute distress. The vital signs are documented above. CARDIAC: There is a regular rate and rhythm.  VASCULAR: Nonpalpable pedal pulses.  Trace edema left leg PULMONARY: Non-labored respirations MUSCULOSKELETAL: There are no major deformities or cyanosis. NEUROLOGIC: No focal weakness or paresthesias are detected. SKIN: There are no ulcers or rashes noted. PSYCHIATRIC: The patient has a normal affect.  STUDIES:   I have reviewed the following ultrasound studies:  Stenosis: +-------------------+-----------+  Location           Stent        +-------------------+-----------+  Left Common Iliac  no stenosis  +-------------------+-----------+  Left External Iliacno stenosis  +-------------------+-----------+   +-------+-----------+-----------+------------+------------+  ABI/TBIToday's ABIToday's TBIPrevious ABIPrevious TBI  +-------+-----------+-----------+------------+------------+  Right  0.64       0.30       0.64        0.42          +-------+-----------+-----------+------------+------------+  Left   0.99       0.68       0.84        0.61          +-------+-----------+-----------+------------+------------+  MEDICAL ISSUES:   Lower extremity atherosclerotic vascular disease with claudication: Bilateral iliofemoral interventions are widely patent.  The patient's symptoms are well managed.  He will follow-up in 6 months with repeat aortoiliac duplex and ABIs.  I will also check carotid study since it has been 7 years, and he has a known family history, with his dad undergoing endarterectomy in the past.    Leia Alf, MD, FACS [facility] [phone] Pager [phone]

## 2021-06-09 ENCOUNTER — Other Ambulatory Visit: Payer: Self-pay

## 2021-06-09 DIAGNOSIS — I70213 Atherosclerosis of native arteries of extremities with intermittent claudication, bilateral legs: Secondary | ICD-10-CM

## 2021-06-09 DIAGNOSIS — Z8249 Family history of ischemic heart disease and other diseases of the circulatory system: Secondary | ICD-10-CM

## 2021-06-16 ENCOUNTER — Ambulatory Visit (INDEPENDENT_AMBULATORY_CARE_PROVIDER_SITE_OTHER): Payer: Medicare Other | Admitting: Podiatry

## 2021-06-16 ENCOUNTER — Encounter: Payer: Self-pay | Admitting: Podiatry

## 2021-06-16 ENCOUNTER — Other Ambulatory Visit: Payer: Self-pay

## 2021-06-16 DIAGNOSIS — M79675 Pain in left toe(s): Secondary | ICD-10-CM | POA: Diagnosis not present

## 2021-06-16 DIAGNOSIS — L84 Corns and callosities: Secondary | ICD-10-CM | POA: Diagnosis not present

## 2021-06-16 DIAGNOSIS — M79674 Pain in right toe(s): Secondary | ICD-10-CM

## 2021-06-16 DIAGNOSIS — B351 Tinea unguium: Secondary | ICD-10-CM

## 2021-06-16 DIAGNOSIS — E1151 Type 2 diabetes mellitus with diabetic peripheral angiopathy without gangrene: Secondary | ICD-10-CM | POA: Diagnosis not present

## 2021-06-20 NOTE — Progress Notes (Signed)
  Subjective:  Patient ID: Anthony [name], male    DOB: [birth year],  MRN: [id number]  75 y.o. male presents at risk foot care. Pt has h/o NIDDM with PAD and painful thick toenails that are difficult to trim. Pain interferes with ambulation. Aggravating factors include wearing enclosed shoe gear. Pain is relieved with periodic professional debridement.  Patient states blood glucose was "130-something" mg/dl today.  He voices no new pedal problems on today's visit.  PCP is [name], MD , and last visit was two weeks ago.  Allergies  Allergen Reactions   Spiriva Respimat [Tiotropium Bromide Monohydrate]     Other reaction(s): Throat irritation    Review of Systems: Negative except as noted in the HPI.   Objective:   Constitutional Pt is a pleasant 75 y.o. Caucasian male in NAD. AAO x 3.   Vascular Capillary fill time to digits <3 seconds b/l lower extremities. Palpable PT pulse(s) right lower extremity Faintly palpable DP pulse(s) b/l lower extremities. Nonpalpable PT pulse(s) left lower extremity. Pedal hair present. Lower extremity skin temperature gradient within normal limits.  Neurologic Protective sensation diminished with 10g monofilament b/l.  Dermatologic Pedal skin is thin shiny, atrophic b/l lower extremities. No open wounds b/l lower extremities No interdigital macerations b/l lower extremities Toenails L 3rd toe, L 4th toe, L 5th toe, R 3rd toe, R 4th toe, and R 5th toe elongated, discolored, dystrophic, thickened, and crumbly with subungual debris and tenderness to dorsal palpation. Anonychia noted L hallux, L 2nd toe, R hallux, and R 2nd toe. Nailbed(s) epithelialized.  Hyperkeratotic lesion(s) R 3rd toe.  No erythema, no edema, no drainage, no fluctuance.  Orthopedic: Normal muscle strength 5/5 to all lower extremity muscle groups bilaterally. No pain crepitus or joint limitation noted with ROM b/l. Hammertoe(s) noted to the 2-5 bilaterally.   Radiographs: None Assessment:    1. Pain due to onychomycosis of toenails of both feet   2. Corns   3. Type II diabetes mellitus with peripheral circulatory disorder [facility])    Plan:  Patient was evaluated and treated and all questions answered.  Onychomycosis with pain -Nails palliatively debridement as below. -Educated on self-care  Procedure: Nail Debridement Rationale: Pain Type of Debridement: manual, sharp debridement. Instrumentation: Nail nipper, rotary burr. Number of Nails: 10  -Examined patient. -Continue diabetic foot care principles. -Patient to continue soft, supportive shoe gear daily. -Toenails L 3rd toe, L 4th toe, L 5th toe, R 3rd toe, R 4th toe, and R 5th toe debrided in length and girth without iatrogenic bleeding with sterile nail nipper and dremel.  -Corn(s) R 3rd toe pared utilizing sterile scalpel blade without complication or incident. Total number debrided=1. -Patient to report any pedal injuries to medical professional immediately. -Patient/POA to call should there be question/concern in the interim.  Return in about 3 months (around 09/16/2021).  Marzetta Board, DPM

## 2021-06-22 ENCOUNTER — Telehealth: Payer: Self-pay | Admitting: Pulmonary Disease

## 2021-06-22 NOTE — Telephone Encounter (Signed)
Spoke with pt who is requesting Rx for travel C-Pap which he will order and pay for out of pocket. May we have a printed Rx for travel C-Pap? Dr. Halford Chessman please advise.

## 2021-06-24 NOTE — Telephone Encounter (Signed)
Called and spoke with patient to let him know of message from Dr. Halford Chessman. Patient states that he will look at [email or web address] website and call us back with decision. Will wait for patient to call back.

## 2021-06-24 NOTE — Telephone Encounter (Signed)
Please have him look up travel CPAP machine options at [email or web address] or similar website.  Once he finds a device he would like to get, then he can let me know and I can write a script for that particular machine.

## 2021-06-25 ENCOUNTER — Other Ambulatory Visit: Payer: Self-pay | Admitting: Pulmonary Disease

## 2021-06-25 DIAGNOSIS — G4733 Obstructive sleep apnea (adult) (pediatric): Secondary | ICD-10-CM

## 2021-06-25 NOTE — Telephone Encounter (Signed)
Patient stopped by office hoping to get printed script for travel CPAP as he is going to [place] next week. There has not been a script printed yet and Dr. Halford Chessman is not in the office. I informed patient that I can print out script and Dr. Halford Chessman is here in the morning and he can sign it then. I will sit it in Dr. Halford Chessman box for in the morning and he can come get it then. Nothing further needed.

## 2021-06-25 NOTE — Telephone Encounter (Signed)
Pt stopped by the office today and stated that he wants the Resmed Miniair CPAP machine.   Pls regard; [phone]

## 2021-06-26 ENCOUNTER — Telehealth: Payer: Self-pay | Admitting: Pulmonary Disease

## 2021-06-26 DIAGNOSIS — Z8616 Personal history of COVID-19: Secondary | ICD-10-CM

## 2021-06-26 HISTORY — DX: Personal history of COVID-19: Z86.16

## 2021-06-26 NOTE — Telephone Encounter (Signed)
disregard

## 2021-07-14 ENCOUNTER — Ambulatory Visit: Payer: Medicare Other | Admitting: Pulmonary Disease

## 2021-07-16 ENCOUNTER — Other Ambulatory Visit: Payer: Self-pay | Admitting: Pulmonary Disease

## 2021-07-24 ENCOUNTER — Other Ambulatory Visit (HOSPITAL_COMMUNITY): Payer: Self-pay | Admitting: Internal Medicine

## 2021-07-24 MED ORDER — ANORO ELLIPTA 62.5-25 MCG/INH IN AEPB: INHALATION_SPRAY | RESPIRATORY_TRACT | 0 refills | Status: DC

## 2021-09-15 ENCOUNTER — Other Ambulatory Visit: Payer: Self-pay

## 2021-09-15 ENCOUNTER — Encounter: Payer: Self-pay | Admitting: Pulmonary Disease

## 2021-09-15 ENCOUNTER — Ambulatory Visit (INDEPENDENT_AMBULATORY_CARE_PROVIDER_SITE_OTHER): Payer: Medicare Other | Admitting: Pulmonary Disease

## 2021-09-15 VITALS — BP 112/68 | HR 66 | Temp 98.0°F | Ht 68.0 in | Wt 226.8 lb

## 2021-09-15 DIAGNOSIS — J986 Disorders of diaphragm: Secondary | ICD-10-CM | POA: Diagnosis not present

## 2021-09-15 DIAGNOSIS — G4733 Obstructive sleep apnea (adult) (pediatric): Secondary | ICD-10-CM | POA: Diagnosis not present

## 2021-09-15 DIAGNOSIS — J449 Chronic obstructive pulmonary disease, unspecified: Secondary | ICD-10-CM

## 2021-09-15 MED ORDER — ANORO ELLIPTA 62.5-25 MCG/INH IN AEPB: INHALATION_SPRAY | RESPIRATORY_TRACT | 3 refills | Status: AC

## 2021-09-15 NOTE — Progress Notes (Signed)
[facility]  Chief Complaint  Patient presents with   Follow-up    Sob-same, runny nose. CPAP doing good    Constitutional:  BP 112/68 (BP Location: Right Arm, Cuff Size: Normal)   Pulse 66   Temp 98 F (36.7 C) (Temporal)   Ht 5\' 8"  (1.727 m)   Wt 226 lb 12.8 oz (102.9 kg)   SpO2 96%   BMI 34.48 kg/m   Past Medical History:  CAD, HLD, combined CHF, PAD, HTN, DM, Lumbar stenosis  Past Surgical History:  He  has a past surgical history that includes Tonsillectomy (1965); Bunionectomy ([place]); Neuroma surgery (1996); Lumbar disc surgery (Dec 1997 & Ampril 2005); Cervical fusion (Nov 2002); Hammer toe surgery (June 2007 & August 2008); Carpal tunnel release (09/30/2006); Hand Arthroplasty (Oct 2010); Heart catherization (03/26/1999); Angioplasty (06/02/2000); Spine surgery (04/21/2004); Abdominal angiogram (Dec. 4, 2013); Cardiac catheterization; Anterior lateral lumbar fusion 4 levels (Right, 05/28/2014); Lumbar percutaneous pedicle screw 4 level (N/A, 05/28/2014); Eye surgery (Bilateral, 2014); Colonoscopy w/ biopsies and polypectomy; Endarterectomy femoral (Right, 10/10/2014); abdominal aortagram (N/A, 11/29/2012); abdominal aortagram (N/A, 09/03/2014); Cardiac catheterization (N/A, 12/07/2016); RIGHT/LEFT HEART CATH AND CORONARY ANGIOGRAPHY (N/A, 05/11/2019); CORONARY STENT INTERVENTION (N/A, 05/11/2019); Colonoscopy (2017); Colonoscopy (08/26/2020); ABDOMINAL AORTOGRAM W/LOWER EXTREMITY (N/A, 09/16/2020); Back surgery; Coronary angioplasty; Femoral endarterectomy (Left, 11/05/2020); Insertion of iliac stent (Left, 11/05/2020); Endarterectomy femoral (Left, 11/05/2020); Insertion of iliac stent (Left, 11/05/2020); and INTRAVASCULAR LITHOTRIPSY (Left, 11/05/2020).  Brief Summary:  Anthony [name] is a 75 y.o. male former smoker with COPD, OSA, and rt diaphragm elevation.      Subjective:   He is scheduled for excision of hidradenitis with Dr.  Nadeen Landau later this month.  He is deferring flu and COVID vaccines until after this.  He was travelling in [place].  Used travel CPAP.  No issues with mask fit.  Not having cough, wheeze, or sputum.  Anoro works well, but is expensive.  Asked about whether there is alternative inhaler at the [place] he could get.  He was intolerant of spiriva respimat due to throat irritation.  Physical Exam:   Appearance - well kempt   ENMT - no sinus tenderness, no oral exudate, no LAN, Mallampati 3 airway, no stridor  Respiratory - equal breath sounds bilaterally, no wheezing or rales  CV - s1s2 regular rate and rhythm, no murmurs  Ext - no clubbing, no edema  Skin - no rashes  Psych - normal mood and affect   Pulmonary testing:  PFT 08/13/12>>FEV1 1.83 (62%), FEV1% 64, TLC 5.17 (82%), DLCO 67%, +BD PFT 11/28/19 >> FEV1 2.30 (78%), FEV1% 75, TLC 5.84 (85%), DLCO 70%, no BD  Chest Imaging:  SNIFF test 08/14/12>>Paradoxical motion of the right hemidiaphragm with inspiration  Sleep Tests:  PSG 08/14/12>>AHI 38.5.  CPAP 13 cm H2O with 1 liter oxygen ONO with CPAP and RA 01/18/13 >> test time 10 hrs 37 min. Mean SpO2 94%, low SpO2 86%. Spent 16 sec with SpO2 < 88% CPAP 06/14/20 to 07/13/20 >> used on 30 of 30 nights with average 9 hrs 20 min.  Average AHI 0.9 with CPAP 13 cm H2O.  Cardiac Tests:  RHC/LHC 05/11/19 >> RA 4, RV 27/5, PA 28/4(15), PCW 13, CI 2.2, PVR < 1 WU; 2v CAD, chronic proximal RCA total occlusion, angioplasty to diagonal branch, DES to proximal LAD Echo 08/15/19 >> EF 55 to 60%, mild LVH  Social History:  He  reports that he quit smoking about 34  years ago. His smoking use included cigarettes. He has a 56.00 pack-year smoking history. He has never used smokeless tobacco. He reports current alcohol use. He reports that he does not use drugs.  Family History:  His family history includes Cancer in his father and mother; Deep vein thrombosis in his mother; Diabetes in his mother;  Heart attack in his father; Heart disease (age of onset: 46) in his father; Hyperlipidemia in his father and mother; Hypertension in his father and mother.     Assessment/Plan:   COPD with chronic bronchitis. - intolerant of spiriva respimat due to throat irritation - continue anoro; he will check if there is alternate inhaler [facility] with [facility] for cost savings - prn albuterol   Obstructive sleep apnea. - he is compliant with CPAP and reports benefit from therapy - he uses [employer] for his DME - he travels frequently, and uses a travel CPAP for this - continue CPAP 13 cm H2O   Rt hemidiaphragm elevation. - continue CPAP at night  Hidradenitis suppurativa. - he is scheduled for excision with Dr. Nadeen Landau later this month - no pulmonary contraindications for him to proceed with excision   CAD, chronic diastolic CHF. - followed by Dr. [name] with cardiology   Peripheral artery disease. - followed by Dr. [name] with vascular surgery  Time Spent Involved in Patient Care on Day of Examination:  32 minutes  Follow up:   Patient Instructions  You can check if the [place] has stiolto on it's medication formulary, and this could take the place of anoro  Follow up in 1 year  Medication List:   Allergies as of 09/15/2021       Reactions   Spiriva Respimat [tiotropium Bromide Monohydrate]    Other reaction(s): Throat irritation        Medication List        Accurate as of September 15, 2021  9:24 AM. If you have any questions, ask your nurse or doctor.          STOP taking these medications    doxycycline 50 MG capsule Commonly known as: VIBRAMYCIN Stopped by: [name], MD   doxycycline 50 MG capsule Commonly known as: MONODOX Stopped by: [name], MD       TAKE these medications    Anoro Ellipta 62.5-25 MCG/INH Aepb Generic drug: umeclidinium-vilanterol INHALE 1 INHALATION BY  MOUTH INTO THE LUNGS DAILY   Aspirin Low Dose 81 MG EC  tablet Generic drug: aspirin Take 81 mg by mouth every evening.   BENFOTIAMINE PO Take 250 mg by mouth 2 (two) times daily.   bisoprolol 5 MG tablet Commonly known as: ZEBETA Take 1 tablet by mouth daily. What changed: Another medication with the same name was removed. Continue taking this medication, and follow the directions you see here. Changed by: [name], MD   carboxymethylcellulose 0.5 % Soln Commonly known as: REFRESH PLUS Place 1 drop into both eyes in the morning, at noon, in the evening, and at bedtime. What changed: Another medication with the same name was removed. Continue taking this medication, and follow the directions you see here. Changed by: [name], MD   CINNAMON PO Take 1,000 mg by mouth every evening.   doxazosin 2 MG tablet Commonly known as: CARDURA Take 2 mg by mouth every evening. What changed: Another medication with the same name was removed. Continue taking this medication, and follow the directions you see here. Changed by: [name], MD   empagliflozin  10 MG Tabs tablet Commonly known as: JARDIANCE Take 10 mg by mouth daily.   fish oil-omega-3 fatty acids 1000 MG capsule Take 2 g by mouth daily.   fluticasone 50 MCG/ACT nasal spray Commonly known as: FLONASE Place 2 sprays into both nostrils at bedtime as needed for allergies or rhinitis. What changed: Another medication with the same name was removed. Continue taking this medication, and follow the directions you see here. Changed by: [name], MD   furosemide 20 MG tablet Commonly known as: LASIX Take 20 mg by mouth daily at 12 noon.   GARLIC PO Take 0,630 mg by mouth daily.   ibuprofen 200 MG tablet Commonly known as: ADVIL Take 400 mg by mouth every 6 (six) hours as needed for headache or moderate pain.   isosorbide mononitrate 30 MG 24 hr tablet Commonly known as: IMDUR TAKE 1 TABLET BY MOUTH  DAILY   L-Lysine 1000 MG Tabs Take 1,000 mg by mouth daily.    losartan 100 MG tablet Commonly known as: COZAAR Take 100 mg by mouth daily. What changed: Another medication with the same name was removed. Continue taking this medication, and follow the directions you see here. Changed by: [name], MD   metFORMIN 1000 MG tablet Commonly known as: GLUCOPHAGE Take 1,000 mg by mouth 2 (two) times daily with a meal. What changed: Another medication with the same name was removed. Continue taking this medication, and follow the directions you see here. Changed by: [name], MD   multivitamin tablet Take 1 tablet by mouth daily.   NEOMYCIN-POLYMYXIN-HYDROCORTISONE 1 % Soln OTIC solution Commonly known as: CORTISPORIN Place in ear(s).   nitroGLYCERIN 0.4 MG SL tablet Commonly known as: NITROSTAT Place 1 tablet (0.4 mg total) under the tongue every 5 (five) minutes as needed for chest pain. What changed: Another medication with the same name was removed. Continue taking this medication, and follow the directions you see here. Changed by: [name], MD   ofloxacin 0.3 % OTIC solution Commonly known as: FLOXIN SMARTSIG:In Ear(s)   OZEMPIC (1 MG/DOSE) [place] Inject 1 mg into the skin every Thursday.   pentoxifylline 400 MG CR tablet Commonly known as: TRENTAL TAKE 1 TABLET BY MOUTH  TWICE DAILY What changed: Another medication with the same name was removed. Continue taking this medication, and follow the directions you see here. Changed by: [name], MD   potassium chloride SA 20 MEQ tablet Commonly known as: KLOR-CON Take 20 mEq by mouth 2 (two) times daily.   rOPINIRole 1 MG tablet Commonly known as: REQUIP Take 1 mg by mouth every evening.   simvastatin 40 MG tablet Commonly known as: ZOCOR Take 40 mg by mouth every evening.   SUPER B COMPLEX/C PO Take 1 tablet by mouth daily.   UNABLE TO FIND Med Name: Theotis Burrow 1 tablet daily        Signature:  [name], MD [place] Pager - [phone] 09/15/2021, 9:24 AM

## 2021-09-15 NOTE — Patient Instructions (Signed)
You can check if the [place] has stiolto on it's medication formulary, and this could take the place of anoro  Follow up in 1 year

## 2021-09-17 ENCOUNTER — Ambulatory Visit: Payer: Self-pay | Admitting: Surgery

## 2021-09-17 NOTE — H&P (Signed)
CC: Perineal drainage/lesion, possible hidradenitis  HPI: [name] is a very pleasant [phone] with hx of HTN, HLD, CAD (s/p stent, no longer on plavix), PAD presents to me for second opinion regarding possible hemorrhoids and rectal bleeding.  He reports a multi-month history of these symptoms.  He has been seen in our practice by 2 of my partners.  He reports that the rectal bleeding and experiences improved since he stopped taking the antiplatelet agent.  He reports that his symptoms are primarily that of rectal bleeding and occasionally some tissue prolapse.  This spontaneously reduces.  He has not consistently take a fiber supplement.  He drinks less than 64 ounces of water per day.  He spends no more than 2-3 minutes on the commode.  He does report variable stool with regards to hard/soft.  He denies any specific severe perianal pain or history of abscess.  He denies any prior anorectal procedures or surgeries.  He underwent colonoscopy with Dr. Ardis Hughs 08/26/20 - this demonstrated 2 diminutive polyps that were removed.  Diverticulosis.  INTERVAL HX He is now back from his winter trip to [place].  He still has intermittent perineal pains and some drainage from this chronic-type wound.  The only change in his health history since I met the office as a result longer taking Plavix. Cardiologist is Dr. Phillip Heal.  PMH: HTN, HLD, CAD (s/p stent, no longer on plavix), PAD  PSH: He denies any prior anorectal procedures or surgeries.  Vascular surgery with Dr. [name]  FHx: Denies FHx of breast, endometrial, ovarian or cervical cancer.  He does report that his father had colon cancer in his 69s.  Social: Denies use of tobacco/EtOH/drugs  ROS: A comprehensive 10 system review of systems was completed with the patient and pertinent findings as noted above.  The patient is a 75 year old male.   Allergies Janeann Forehand, CNA; 04/28/2021 11:01 AM) No Known Drug Allergies   [10/05/2019]: Allergies  Reconciled    Medication History Janeann Forehand, CNA; 04/28/2021 11:04 AM) Ozempic (0.25 or 0.5 MG/DOSE)  (2MG/1.5ML Soln Pen-inj, Subcutaneous) Active. Bisoprolol Fumarate  (5MG Tablet, Oral) Active. Doxazosin Mesylate  (1MG Tablet, Oral) Active. Lasix  (10MG/ML Solution, Injection) Active. Isosorbide Mononitrate  (10MG Tablet, Oral) Active. Jardiance  (10MG Tablet, Oral) Active. Losartan Potassium  (25MG Tablet, Oral) Active. MetFORMIN & Diet Manage Prod  (500MG Misc, Oral) Active. Pentoxifylline  (400MG Tablet ER, Oral) Active. Pot Cl in D5W Lact Ringers  (20MEQ/L Solution, Intravenous) Active. ROPINIRole HCl  (0.25 & 0.5 & 1MG Kit, Oral) Active. Simvastatin  (5MG Tablet, Oral) Active. Anoro Ellipta  (62.5-25MCG/INH Aero Pow Br Act, Inhalation) Active. Medications Reconciled  Aspirin  (81MG Tablet, Oral) Active. Cinammon  Active. Fish Oil  (1000MG Capsule, Oral) Active. Garlic  (7096GE Capsule, Oral) Active. Lysine  (500MG Tablet, Oral two times daily) Active. One Daily Multiple Vitamin  (Oral) Active. B Complex + C TR  (Oral) Active.    Review of Systems Harrell Gave M. Maxxwell Edgett MD; 04/28/2021 12:02 PM) General Present- Weight Loss. Not Present- Appetite Loss, Chills, Fatigue, Fever, Night Sweats and Weight Gain. HEENT Present- Ringing in the Ears and Wears glasses/contact lenses. Not Present- Earache, Hearing Loss, Hoarseness, Nose Bleed, Oral Ulcers, Seasonal Allergies, Sinus Pain, Sore Throat, Visual Disturbances and Yellow Eyes. Respiratory Not Present- Cough and Decreased Exercise Tolerance. Cardiovascular Not Present- Chest Pain and Difficulty Breathing Lying Down. Gastrointestinal Present- Hemorrhoids. Not Present- Abdominal Pain, Bloating, Bloody Stool, Change in Bowel Habits, Chronic diarrhea, Constipation, Difficulty Swallowing, Excessive  gas, Gets full quickly at meals, Indigestion, Nausea, Rectal Pain and Vomiting. Male Genitourinary Present- Urine Leakage. Not Present-  Blood in Urine, Change in Urinary Stream, Frequency, Impotence, Nocturia, Painful Urination and Urgency. Neurological Not Present- Decreased Memory and Difficulty Speaking. Endocrine Not Present- Appetite Changes and Cold Intolerance. Hematology Not Present- Blood Clots and Blood Thinners.   Physical Exam Harrell Gave M. Tenesha Garza MD; 04/28/2021 12:03 PM) The physical exam findings are as follows: Note:  Constitutional: No acute distress; conversant; wearing mask Eyes: Moist conjunctiva; no lid lag; anicteric sclerae; pupils equal and round Lungs: Normal respiratory effort CV: rrr Anorectal: 2 mm right lateral comedone. Anterior midline perineum as source of his issue with some skin thickening and induration of subQ adipose tissue ?hidradenitis? - no active drainage today. No fluctuance, purulence or evident infection. MSK: Normal gait Psychiatric: Appropriate affect **A chaperone, Altamese Cabal, was present for this encounter    Assessment & Plan Harrell Gave M. Anessa Charley MD; 04/28/2021 12:06 PM) HIDRADENITIS (L73.2) Story: Mr. Pulliam is a very pleasant 51yoM HTN, HLD, CAD (s/p stent, no longer on plavix), PAD - here for evaluation of painless intermittent rectal bleeding. Colonoscopy completed. As things appear intervally changed, I suspect the source of his problems is potentially related to what may be hidradenitis vs ?anal fistula - spontaneous drainage from left perianal skin when thickened skin is pressed in the anterior midline on perineum at list visit no longer present today Impression: -We spent time reviewing his findings and the plan moving forward. He would like this area excised. We discussed anorectal exam under anesthesia with interrogation of this potentially fistulous process to see if this is actually an anal fistula versus related to what may be hidradenitis. For hidradenitis, excising the involved area and potentially doing a short segment fistulotomy.  -The anatomy and physiology  of the anal canal was discussed at length with the patient. The pathophysiology of hidradenitis as well as anal fistulas was discussed again today -Discussed interrogation of wound, possible excision if appears to be hidradenitis vs fistulotomy/seton if actually anal fistula -The planned procedures, material risks (including, but not limited to, pain, bleeding, infection, scarring, need for blood transfusion, damage to anal sphincter, incontinence of gas and/or stool, need for additional procedures, recurrence, pneumonia, heart attack, stroke, death) benefits and alternatives to surgery were discussed at length. I noted a good probability that the procedure would help improve their symptoms. The patient's questions were answered to their satisfaction, they voiced understanding and elected to proceed with surgery. Additionally, we discussed typical postoperative expectations and the recovery process.  This patient encounter took 20 minutes today to perform the following: take history, perform exam, review outside records, interpret imaging, counsel the patient on their diagnosis and document encounter, findings & plan in the EHR  Signed by Ileana Roup, MD (04/28/2021 12:07 PM)

## 2021-09-23 ENCOUNTER — Other Ambulatory Visit: Payer: Self-pay

## 2021-09-23 ENCOUNTER — Ambulatory Visit (INDEPENDENT_AMBULATORY_CARE_PROVIDER_SITE_OTHER): Payer: Medicare Other | Admitting: Podiatry

## 2021-09-23 ENCOUNTER — Encounter: Payer: Self-pay | Admitting: Podiatry

## 2021-09-23 ENCOUNTER — Encounter (HOSPITAL_BASED_OUTPATIENT_CLINIC_OR_DEPARTMENT_OTHER): Payer: Self-pay | Admitting: Surgery

## 2021-09-23 DIAGNOSIS — E1151 Type 2 diabetes mellitus with diabetic peripheral angiopathy without gangrene: Secondary | ICD-10-CM | POA: Diagnosis not present

## 2021-09-23 DIAGNOSIS — L84 Corns and callosities: Secondary | ICD-10-CM

## 2021-09-23 DIAGNOSIS — N289 Disorder of kidney and ureter, unspecified: Secondary | ICD-10-CM | POA: Insufficient documentation

## 2021-09-23 DIAGNOSIS — B351 Tinea unguium: Secondary | ICD-10-CM | POA: Diagnosis not present

## 2021-09-23 DIAGNOSIS — M79674 Pain in right toe(s): Secondary | ICD-10-CM

## 2021-09-23 DIAGNOSIS — M79675 Pain in left toe(s): Secondary | ICD-10-CM | POA: Diagnosis not present

## 2021-09-23 DIAGNOSIS — E119 Type 2 diabetes mellitus without complications: Secondary | ICD-10-CM | POA: Insufficient documentation

## 2021-09-23 NOTE — Progress Notes (Addendum)
ADDENDUM:  Chart reviewed by anesthesia, Dr Elgie Congo MDA, ok to proceed.   Spoke w/ via phone for pre-op interview--- pt Lab needs dos----  cbc w/ diff, bmp             Lab results------ current ekg in epic/ chart COVID test -----patient states asymptomatic no test needed Arrive at ------- 0530 on 09-25-2021 NPO after MN NO Solid Food.  Clear liquids from MN until--- 0430 Med rec completed Medications to take morning of surgery ----- imdur, ellipta inhaler Diabetic medication ----- do not take jardiance, metformin morning of surgery Patient instructed no nail polish to be worn day of surgery Patient instructed to bring photo id and insurance card day of surgery  Patient aware to have Driver (ride ) / caregiver  for 24 hours after surgery --wife, Rosann Auerbach Patient Special Instructions ----- asked to bring rescue inhaler dos and bring cpap/ mask/ tubing leave in car  Pre-Op special Istructions ----- pt has cardiac office note clearance by dr bensimhon dated 05-21-2021 and has pulmonology office note clearance dr by Halford Chessman, both in epic/ chart  Patient verbalized understanding of instructions that were given at this phone interview. Patient denies shortness of breath, chest pain, fever, cough at this phone interview.    Anesthesia Review: HTN: chronic stable angia on imdur, pt stated has never needed to take a nitro;  CAD s/p PCI and stenting 2001 & 05/ 2020;  Chronic diastolic CHF, ef 53%;  PVD/ PAD s/p bilateral iliofemoral intervention's ;  COPD w/ chronic bronchitis, pt states sob w/ stairs recovers quickly ok with household chores, check's O2 sats at home, 96-97% RA;   DM2; OSA pt stated uses cpap nightly;  Pt denies cardiac s&s, mild doe w/ stairs, and mild edema left lower extremity post surgery  PCP: Dr Aviva Signs Cardiologist : Dr [name] (lov 05-21-2021 epic) Pulmonology:  Dr Halford Chessman Cassell Clement 09-15-2021 epic) Vascular:  Dr [name] (lov 06-08-2021 epic)  Chest x-ray : 10-21-2021 epic EKG :  05-21-2021 epic Echo : 08-15-2019 epic Stress test: 10-18-2013 epic Cardiac Cath :  05-11-2019 epic Activity level:  see above Sleep Study/ CPAP : Yes/ Yes Fasting Blood Sugar :   170   / Checks Blood Sugar -- times a day:  twice weekly  Blood Thinner/ Instructions Maryjane Hurter Dose: no ASA / Instructions/ Last Dose :  ASA 81mg / pt has not stopped prior to surgery

## 2021-09-25 ENCOUNTER — Ambulatory Visit (HOSPITAL_BASED_OUTPATIENT_CLINIC_OR_DEPARTMENT_OTHER)
Admission: RE | Admit: 2021-09-25 | Discharge: 2021-09-25 | Disposition: A | Discharge status: Home / Self Care | Payer: Medicare Other | Attending: Surgery | Admitting: Surgery

## 2021-09-25 ENCOUNTER — Ambulatory Visit (HOSPITAL_BASED_OUTPATIENT_CLINIC_OR_DEPARTMENT_OTHER): Payer: Medicare Other | Admitting: Anesthesiology

## 2021-09-25 ENCOUNTER — Encounter (HOSPITAL_BASED_OUTPATIENT_CLINIC_OR_DEPARTMENT_OTHER): Payer: Self-pay | Admitting: Surgery

## 2021-09-25 ENCOUNTER — Encounter (HOSPITAL_BASED_OUTPATIENT_CLINIC_OR_DEPARTMENT_OTHER)
Admission: RE | Disposition: A | Discharge status: Home / Self Care | Payer: Self-pay | Source: Home / Self Care | Attending: Surgery

## 2021-09-25 DIAGNOSIS — Z8616 Personal history of COVID-19: Secondary | ICD-10-CM | POA: Diagnosis not present

## 2021-09-25 DIAGNOSIS — L732 Hidradenitis suppurativa: Secondary | ICD-10-CM | POA: Insufficient documentation

## 2021-09-25 DIAGNOSIS — Z955 Presence of coronary angioplasty implant and graft: Secondary | ICD-10-CM | POA: Insufficient documentation

## 2021-09-25 DIAGNOSIS — Z888 Allergy status to other drugs, medicaments and biological substances status: Secondary | ICD-10-CM | POA: Diagnosis not present

## 2021-09-25 DIAGNOSIS — I10 Essential (primary) hypertension: Secondary | ICD-10-CM | POA: Diagnosis not present

## 2021-09-25 DIAGNOSIS — Z87891 Personal history of nicotine dependence: Secondary | ICD-10-CM | POA: Diagnosis not present

## 2021-09-25 DIAGNOSIS — I2510 Atherosclerotic heart disease of native coronary artery without angina pectoris: Secondary | ICD-10-CM | POA: Insufficient documentation

## 2021-09-25 HISTORY — DX: Unspecified osteoarthritis, unspecified site: M19.90

## 2021-09-25 HISTORY — DX: Chronic obstructive pulmonary disease, unspecified: J44.9

## 2021-09-25 HISTORY — DX: Presence of coronary angioplasty implant and graft: Z95.5

## 2021-09-25 HISTORY — PX: HYDRADENITIS EXCISION: SHX5243

## 2021-09-25 HISTORY — DX: Type 2 diabetes mellitus without complications: E11.9

## 2021-09-25 HISTORY — DX: Peripheral vascular disease, unspecified: I73.9

## 2021-09-25 HISTORY — DX: Obstructive sleep apnea (adult) (pediatric): G47.33

## 2021-09-25 HISTORY — DX: Other specified chronic obstructive pulmonary disease: J44.89

## 2021-09-25 HISTORY — DX: Localized edema: R60.0

## 2021-09-25 HISTORY — DX: Polyneuropathy, unspecified: G62.9

## 2021-09-25 HISTORY — PX: RECTAL EXAM UNDER ANESTHESIA: SHX6399

## 2021-09-25 HISTORY — DX: Dependence on other enabling machines and devices: Z99.89

## 2021-09-25 HISTORY — DX: Atherosclerosis of native arteries of extremities with intermittent claudication, bilateral legs: I70.213

## 2021-09-25 HISTORY — DX: Chronic diastolic (congestive) heart failure: I50.32

## 2021-09-25 LAB — CBC WITH DIFFERENTIAL/PLATELET
Abs Immature Granulocytes: 0.02 10*3/uL (ref 0.00–0.07)
Basophils Absolute: 0 10*3/uL (ref 0.0–0.1)
Basophils Relative: 1 %
Eosinophils Absolute: 0.2 10*3/uL (ref 0.0–0.5)
Eosinophils Relative: 4 %
HCT: 44.7 % (ref 39.0–52.0)
Hemoglobin: 14.6 g/dL (ref 13.0–17.0)
Immature Granulocytes: 0 %
Lymphocytes Relative: 25 %
Lymphs Abs: 1.4 10*3/uL (ref 0.7–4.0)
MCH: 30.7 pg (ref 26.0–34.0)
MCHC: 32.7 g/dL (ref 30.0–36.0)
MCV: 93.9 fL (ref 80.0–100.0)
Monocytes Absolute: 0.6 10*3/uL (ref 0.1–1.0)
Monocytes Relative: 11 %
Neutro Abs: 3.3 10*3/uL (ref 1.7–7.7)
Neutrophils Relative %: 59 %
Platelets: 150 10*3/uL (ref 150–400)
RBC: 4.76 MIL/uL (ref 4.22–5.81)
RDW: 13.5 % (ref 11.5–15.5)
WBC: 5.5 10*3/uL (ref 4.0–10.5)
nRBC: 0 % (ref 0.0–0.2)

## 2021-09-25 LAB — BASIC METABOLIC PANEL
Anion gap: 12 (ref 5–15)
BUN: 35 mg/dL — ABNORMAL HIGH (ref 8–23)
CO2: 22 mmol/L (ref 22–32)
Calcium: 9.1 mg/dL (ref 8.9–10.3)
Chloride: 102 mmol/L (ref 98–111)
Creatinine, Ser: 1.45 mg/dL — ABNORMAL HIGH (ref 0.61–1.24)
GFR, Estimated: 50 mL/min — ABNORMAL LOW (ref >60)
Glucose, Bld: 200 mg/dL — ABNORMAL HIGH (ref 70–99)
Potassium: 5 mmol/L (ref 3.5–5.1)
Sodium: 136 mmol/L (ref 135–145)

## 2021-09-25 LAB — GLUCOSE, CAPILLARY: Glucose-Capillary: 181 mg/dL — ABNORMAL HIGH (ref 70–99)

## 2021-09-25 SURGERY — EXCISION, HIDRADENITIS, INGUINAL REGION
Anesthesia: General | Site: Perineum

## 2021-09-25 MED ORDER — FENTANYL CITRATE (PF) 100 MCG/2ML IJ SOLN
INTRAMUSCULAR | Status: DC | PRN
  Administered 2021-09-25: 50 ug via INTRAVENOUS
  Administered 2021-09-25 (×2): 25 ug via INTRAVENOUS

## 2021-09-25 MED ORDER — ONDANSETRON HCL 4 MG/2ML IJ SOLN
INTRAMUSCULAR | Status: AC
  Filled 2021-09-25: qty 2

## 2021-09-25 MED ORDER — PHENYLEPHRINE HCL-NACL 20-0.9 MG/250ML-% IV SOLN
INTRAVENOUS | Status: DC | PRN
  Administered 2021-09-25: 20 ug/min via INTRAVENOUS

## 2021-09-25 MED ORDER — BUPIVACAINE LIPOSOME 1.3 % IJ SUSP: 20.0000 mL | Freq: Once | INTRAMUSCULAR | Status: DC

## 2021-09-25 MED ORDER — LIDOCAINE 2% (20 MG/ML) 5 ML SYRINGE
INTRAMUSCULAR | Status: DC | PRN
  Administered 2021-09-25: 100 mg via INTRAVENOUS

## 2021-09-25 MED ORDER — OXYCODONE HCL 5 MG/5ML PO SOLN: 5.0000 mg | Freq: Once | ORAL | Status: DC | PRN

## 2021-09-25 MED ORDER — FENTANYL CITRATE (PF) 100 MCG/2ML IJ SOLN
INTRAMUSCULAR | Status: AC
  Filled 2021-09-25: qty 2

## 2021-09-25 MED ORDER — CHLORHEXIDINE GLUCONATE CLOTH 2 % EX PADS: 6.0000 | MEDICATED_PAD | Freq: Once | CUTANEOUS | Status: DC

## 2021-09-25 MED ORDER — ONDANSETRON HCL 4 MG/2ML IJ SOLN: 4.0000 mg | Freq: Once | INTRAMUSCULAR | Status: DC | PRN

## 2021-09-25 MED ORDER — WHITE PETROLATUM EX OINT
TOPICAL_OINTMENT | CUTANEOUS | Status: AC
  Filled 2021-09-25: qty 5

## 2021-09-25 MED ORDER — FENTANYL CITRATE (PF) 100 MCG/2ML IJ SOLN: 25.0000 ug | INTRAMUSCULAR | Status: DC | PRN

## 2021-09-25 MED ORDER — PHENYLEPHRINE HCL (PRESSORS) 10 MG/ML IV SOLN
INTRAVENOUS | Status: AC
  Filled 2021-09-25: qty 1

## 2021-09-25 MED ORDER — CEFAZOLIN SODIUM-DEXTROSE 2-4 GM/100ML-% IV SOLN
2.0000 g | INTRAVENOUS | Status: AC
  Administered 2021-09-25: 2 g via INTRAVENOUS

## 2021-09-25 MED ORDER — BUPIVACAINE-EPINEPHRINE 0.25% -1:200000 IJ SOLN
INTRAMUSCULAR | Status: DC | PRN
  Administered 2021-09-25: 18 mL

## 2021-09-25 MED ORDER — PROPOFOL 10 MG/ML IV BOLUS
INTRAVENOUS | Status: AC
  Filled 2021-09-25: qty 20

## 2021-09-25 MED ORDER — ACETAMINOPHEN 500 MG PO TABS
ORAL_TABLET | ORAL | Status: AC
  Filled 2021-09-25: qty 2

## 2021-09-25 MED ORDER — OXYCODONE HCL 5 MG PO TABS: 5.0000 mg | ORAL_TABLET | Freq: Once | ORAL | Status: DC | PRN

## 2021-09-25 MED ORDER — ONDANSETRON HCL 4 MG/2ML IJ SOLN
INTRAMUSCULAR | Status: DC | PRN
  Administered 2021-09-25: 4 mg via INTRAVENOUS

## 2021-09-25 MED ORDER — PROPOFOL 10 MG/ML IV BOLUS
INTRAVENOUS | Status: DC | PRN
  Administered 2021-09-25: 200 mg via INTRAVENOUS

## 2021-09-25 MED ORDER — ACETAMINOPHEN 10 MG/ML IV SOLN: 1000.0000 mg | Freq: Once | INTRAVENOUS | Status: DC | PRN

## 2021-09-25 MED ORDER — 0.9 % SODIUM CHLORIDE (POUR BTL) OPTIME
TOPICAL | Status: DC | PRN
Start: 2021-09-25 — End: 2021-09-25
  Administered 2021-09-25: 500 mL

## 2021-09-25 MED ORDER — PHENYLEPHRINE 40 MCG/ML (10ML) SYRINGE FOR IV PUSH (FOR BLOOD PRESSURE SUPPORT)
PREFILLED_SYRINGE | INTRAVENOUS | Status: DC | PRN
  Administered 2021-09-25: 80 ug via INTRAVENOUS

## 2021-09-25 MED ORDER — LIDOCAINE HCL (PF) 2 % IJ SOLN
INTRAMUSCULAR | Status: AC
  Filled 2021-09-25: qty 5

## 2021-09-25 MED ORDER — BUPIVACAINE LIPOSOME 1.3 % IJ SUSP
INTRAMUSCULAR | Status: DC | PRN
  Administered 2021-09-25: 12 mL

## 2021-09-25 MED ORDER — HYDROGEN PEROXIDE 3 % EX SOLN
CUTANEOUS | Status: DC | PRN
  Administered 2021-09-25: 1

## 2021-09-25 MED ORDER — PHENYLEPHRINE 40 MCG/ML (10ML) SYRINGE FOR IV PUSH (FOR BLOOD PRESSURE SUPPORT)
PREFILLED_SYRINGE | INTRAVENOUS | Status: AC
  Filled 2021-09-25: qty 10

## 2021-09-25 MED ORDER — CEFAZOLIN SODIUM-DEXTROSE 2-4 GM/100ML-% IV SOLN
INTRAVENOUS | Status: AC
  Filled 2021-09-25: qty 100

## 2021-09-25 MED ORDER — LACTATED RINGERS IV SOLN: INTRAVENOUS | Status: DC

## 2021-09-25 MED ORDER — ACETAMINOPHEN 500 MG PO TABS
1000.0000 mg | ORAL_TABLET | ORAL | Status: AC
  Administered 2021-09-25: 1000 mg via ORAL

## 2021-09-25 MED ORDER — OXYCODONE HCL 5 MG PO TABS: 5.0000 mg | ORAL_TABLET | Freq: Three times a day (TID) | ORAL | 0 refills | Status: AC | PRN

## 2021-09-25 SURGICAL SUPPLY — 67 items
BENZOIN TINCTURE PRP APPL 2/3 (GAUZE/BANDAGES/DRESSINGS) ×8 IMPLANT
BLADE EXTENDED COATED 6.5IN (ELECTRODE) IMPLANT
BLADE SURG 10 STRL SS (BLADE) IMPLANT
BLADE SURG 15 STRL LF DISP TIS (BLADE) ×3 IMPLANT
BLADE SURG 15 STRL SS (BLADE) ×1
COVER BACK TABLE 60X90IN (DRAPES) ×4 IMPLANT
COVER MAYO STAND STRL (DRAPES) ×4 IMPLANT
DECANTER SPIKE VIAL GLASS SM (MISCELLANEOUS) ×4 IMPLANT
DERMABOND ADVANCED (GAUZE/BANDAGES/DRESSINGS) ×1
DERMABOND ADVANCED .7 DNX12 (GAUZE/BANDAGES/DRESSINGS) ×3 IMPLANT
DRAPE HYSTEROSCOPY (MISCELLANEOUS) IMPLANT
DRAPE LAPAROTOMY 100X72 PEDS (DRAPES) ×4 IMPLANT
DRAPE SHEET LG 3/4 BI-LAMINATE (DRAPES) IMPLANT
DRAPE UTILITY XL STRL (DRAPES) ×4 IMPLANT
DRSG PAD ABDOMINAL 8X10 ST (GAUZE/BANDAGES/DRESSINGS) ×4 IMPLANT
DRSG TEGADERM 8X12 (GAUZE/BANDAGES/DRESSINGS) ×4 IMPLANT
GAUZE 4X4 16PLY ~~LOC~~+RFID DBL (SPONGE) ×4 IMPLANT
GAUZE SPONGE 4X4 12PLY STRL (GAUZE/BANDAGES/DRESSINGS) ×4 IMPLANT
GAUZE SPONGE 4X4 12PLY STRL LF (GAUZE/BANDAGES/DRESSINGS) ×4 IMPLANT
GLOVE SRG 8 PF TXTR STRL LF DI (GLOVE) ×3 IMPLANT
GLOVE SURG ENC MOIS LTX SZ7.5 (GLOVE) ×4 IMPLANT
GLOVE SURG UNDER POLY LF SZ8 (GLOVE) ×1
GOWN STRL REUS W/TWL LRG LVL3 (GOWN DISPOSABLE) ×4 IMPLANT
HYDROGEN PEROXIDE 16OZ (MISCELLANEOUS) ×4 IMPLANT
IV CATH 14GX2 1/4 (CATHETERS) IMPLANT
IV CATH 18G SAFETY (IV SOLUTION) ×4 IMPLANT
KIT SIGMOIDOSCOPE (SET/KITS/TRAYS/PACK) IMPLANT
KIT TURNOVER CYSTO (KITS) ×4 IMPLANT
LEGGING LITHOTOMY PAIR STRL (DRAPES) IMPLANT
LOOP VESSEL MAXI BLUE (MISCELLANEOUS) IMPLANT
NDL SAFETY ECLIPSE 18X1.5 (NEEDLE) IMPLANT
NEEDLE HYPO 18GX1.5 SHARP (NEEDLE)
NEEDLE HYPO 22GX1.5 SAFETY (NEEDLE) ×4 IMPLANT
NEEDLE HYPO 25X1 1.5 SAFETY (NEEDLE) IMPLANT
NS IRRIG 500ML POUR BTL (IV SOLUTION) ×4 IMPLANT
PACK BASIN DAY SURGERY FS (CUSTOM PROCEDURE TRAY) ×4 IMPLANT
PANTS MESH DISP LRG (UNDERPADS AND DIAPERS) ×3 IMPLANT
PANTS MESH DISPOSABLE L (UNDERPADS AND DIAPERS) ×1
PENCIL SMOKE EVACUATOR (MISCELLANEOUS) ×4 IMPLANT
SPONGE HEMORRHOID 8X3CM (HEMOSTASIS) IMPLANT
SPONGE SURGIFOAM ABS GEL 12-7 (HEMOSTASIS) IMPLANT
SUCTION FRAZIER HANDLE 10FR (MISCELLANEOUS)
SUCTION TUBE FRAZIER 10FR DISP (MISCELLANEOUS) IMPLANT
SUT CHROMIC 2 0 SH (SUTURE) IMPLANT
SUT CHROMIC 3 0 SH 27 (SUTURE) IMPLANT
SUT MNCRL AB 4-0 PS2 18 (SUTURE) ×4 IMPLANT
SUT SILK 0 PSL NDL (SUTURE) IMPLANT
SUT SILK 0 TIES 10X30 (SUTURE) IMPLANT
SUT SILK 2 0 (SUTURE)
SUT SILK 2-0 18XBRD TIE 12 (SUTURE) IMPLANT
SUT VIC AB 2-0 SH 27 (SUTURE)
SUT VIC AB 2-0 SH 27XBRD (SUTURE) IMPLANT
SUT VIC AB 3-0 SH 18 (SUTURE) IMPLANT
SUT VIC AB 3-0 SH 27 (SUTURE) ×2
SUT VIC AB 3-0 SH 27X BRD (SUTURE) ×6 IMPLANT
SUT VIC AB 3-0 SH 27XBRD (SUTURE) IMPLANT
SUT VIC AB 4-0 P-3 18XBRD (SUTURE) IMPLANT
SUT VIC AB 4-0 P3 18 (SUTURE)
SYR 20ML LL LF (SYRINGE) IMPLANT
SYR 30ML LL (SYRINGE) ×4 IMPLANT
SYR BULB IRRIG 60ML STRL (SYRINGE) ×4 IMPLANT
SYR CONTROL 10ML LL (SYRINGE) ×4 IMPLANT
SYR TB 1ML LL NO SAFETY (SYRINGE) IMPLANT
TOWEL OR 17X26 10 PK STRL BLUE (TOWEL DISPOSABLE) ×4 IMPLANT
TRAY DSU PREP LF (CUSTOM PROCEDURE TRAY) ×4 IMPLANT
TUBE CONNECTING 12X1/4 (SUCTIONS) ×4 IMPLANT
YANKAUER SUCT BULB TIP NO VENT (SUCTIONS) ×4 IMPLANT

## 2021-09-25 NOTE — Transfer of Care (Signed)
Immediate Anesthesia Transfer of Care Note  Patient: Anthony [name]  Procedure(s) Performed: Procedure(s) (LRB): EXCISION HIDRADENITIS, INTERROGATION OF PERINEAL WOUND (N/A) ANORECTAL EXAM UNDER ANESTHESIA (N/A)  Patient Location: PACU  Anesthesia Type: General  Level of Consciousness: awake, oriented, sedated and patient cooperative  Airway & Oxygen Therapy: Patient Spontanous Breathing and Patient connected to face mask oxygen  Post-op Assessment: Report given to PACU RN and Post -op Vital signs reviewed and stable  Post vital signs: Reviewed and stable  Complications: No apparent anesthesia complications Last Vitals:  Vitals Value Taken Time  BP 112/54 09/25/21 0847  Temp 36.4 C 09/25/21 0847  Pulse 69 09/25/21 0849  Resp 17 09/25/21 0849  SpO2 96 % 09/25/21 0849  Vitals shown include unvalidated device data.  Last Pain:  Vitals:   09/25/21 0606  TempSrc: Oral  PainSc: 4       Patients Stated Pain Goal: 4 (75/17/00 1749)  Complications: No notable events documented.

## 2021-09-25 NOTE — Discharge Instructions (Addendum)
POST OP INSTRUCTIONS  DIET: As tolerated. Follow a light bland diet the first 24 hours after arrival home, such as soup, liquids, crackers, etc.  Be sure to include lots of fluids daily.  Avoid fast food or heavy meals as your are more likely to get nauseated.  Eat a low fat the next few days after surgery.  Take your usually prescribed home medications unless otherwise directed.  PAIN CONTROL: Pain is best controlled by a usual combination of three different methods TOGETHER: Ice/Heat Over the counter pain medication Prescription pain medication Most patients will experience some swelling and bruising around the surgical site.  Ice packs or heating pads (30-60 minutes up to 6 times a day) will help. Some people prefer to use ice alone, heat alone, alternating between ice & heat.  Experiment to what works for you.  Swelling and bruising can take several weeks to resolve.   It is helpful to take an over-the-counter pain medication regularly for the first few weeks: Ibuprofen (Motrin/Advil) - 200mg  tabs - take 3 tabs (600mg ) every 6 hours as needed for pain Acetaminophen (Tylenol) - you may take 650mg  every 6 hours as needed. You can take this with motrin as they act differently on the body. If you are taking a narcotic pain medication that has acetaminophen in it, do not take over the counter tylenol at the same time.  Iii. NOTE: You may take both of these medications together - most patients  find it most helpful when alternating between the two (i.e. Ibuprofen at 6am,  tylenol at 9am, ibuprofen at 12pm ..Marland Kitchen) A  prescription for pain medication should be given to you upon discharge.  Take your pain medication as prescribed if your pain is not adequatly controlled with the over-the-counter pain reliefs mentioned above.  Avoid getting constipated.  Between the surgery and the pain medications, it is common to experience some constipation.  Increasing fluid intake and taking a fiber supplement (such as  Metamucil, Citrucel, FiberCon, MiraLax, etc) 1-2 times a day regularly will usually help prevent this problem from occurring.  A mild laxative (prune juice, Milk of Magnesia, MiraLax, etc) should be taken according to package directions if there are no bowel movements after 48 hours.    Dressing:   ACTIVITIES as tolerated:   Avoid heavy lifting (>10lbs or 1 gallon of milk) for the next 6 weeks. You may resume regular (light) daily activities beginning the next day--such as daily self-care, walking, climbing stairs--gradually increasing activities as tolerated.  If you can walk 30 minutes without difficulty, it is safe to try more intense activity such as jogging, treadmill, bicycling, low-impact aerobics.  DO NOT PUSH THROUGH PAIN.  Let pain be your guide: If it hurts to do something, don't do it. You may drive when you are no longer taking prescription pain medication, you can comfortably wear a seatbelt, and you can safely maneuver your car and apply brakes.   FOLLOW UP in our office Please call [facility] at [phone] to set up an appointment to see your surgeon in the office for a follow-up appointment approximately 2 weeks after your surgery. Make sure that you call for this appointment the day you arrive home to insure a convenient appointment time.  9. If you have disability or family leave forms that need to be completed, you may have them completed by your primary care physician's office; for return to work instructions, please ask our office staff and they will be happy to assist you  in obtaining this documentation   When to call us [phone]: Poor pain control Reactions / problems with new medications (rash/itching, etc)  Fever over 101.5 F (38.5 C) Inability to urinate Nausea/vomiting Worsening swelling or bruising Continued bleeding from incision. Increased pain, redness, or drainage from the incision  The clinic staff is available to answer your questions during regular  business hours (8:30am-5pm).  Please don't hesitate to call and ask to speak to one of our nurses for clinical concerns.   A surgeon from [facility] is always on call at the hospitals   If you have a medical emergency, go to the nearest emergency room or call 911.  [facility] [address], [place], [place], [place]  [address] MAIN: [phone] FAX: [phone] [email or web address]   Post Anesthesia Home Care Instructions  Activity: Get plenty of rest for the remainder of the day. A responsible individual must stay with you for 24 hours following the procedure.  For the next 24 hours, DO NOT: -Drive a car -Paediatric nurse -Drink alcoholic beverages -Take any medication unless instructed by your physician -Make any legal decisions or sign important papers.  Meals: Start with liquid foods such as gelatin or soup. Progress to regular foods as tolerated. Avoid greasy, spicy, heavy foods. If nausea and/or vomiting occur, drink only clear liquids until the nausea and/or vomiting subsides. Call your physician if vomiting continues.  Special Instructions/Symptoms: Your throat may feel dry or sore from the anesthesia or the breathing tube placed in your throat during surgery. If this causes discomfort, gargle with warm salt water. The discomfort should disappear within 24 hours.  Information for Discharge Teaching: EXPAREL (bupivacaine liposome injectable suspension)   Your surgeon or anesthesiologist gave you EXPAREL(bupivacaine) to help control your pain after surgery.  EXPAREL is a local anesthetic that provides pain relief by numbing the tissue around the surgical site. EXPAREL is designed to release pain medication over time and can control pain for up to 72 hours. Depending on how you respond to EXPAREL, you may require less pain medication during your recovery.  Possible side effects: Temporary loss of  sensation or ability to move in the area where bupivacaine was injected. Nausea, vomiting, constipation Rarely, numbness and tingling in your mouth or lips, lightheadedness, or anxiety may occur. Call your doctor right away if you think you may be experiencing any of these sensations, or if you have other questions regarding possible side effects.  Follow all other discharge instructions given to you by your surgeon or nurse. Eat a healthy diet and drink plenty of water or other fluids.  If you return to the hospital for any reason within 96 hours following the administration of EXPAREL, it is important for health care providers to know that you have received this anesthetic. A teal colored band has been placed on your arm with the date, time and amount of EXPAREL you have received in order to alert and inform your health care providers. Please leave this armband in place for the full 96 hours following administration, and then you may remove the band.   Leave green armband on until Tuesday, September 29, 2021.    May take Tylenol as needed for pain starting at 1200 Noon.

## 2021-09-25 NOTE — Anesthesia Preprocedure Evaluation (Signed)
Anesthesia Evaluation  Patient identified by MRN, date of birth, ID band Patient awake    Reviewed: Allergy & Precautions, NPO status , Patient's Chart, lab work & pertinent test results  Airway Mallampati: II  TM Distance: >3 FB Neck ROM: Full    Dental no notable dental hx.    Pulmonary sleep apnea and Continuous Positive Airway Pressure Ventilation , COPD, former smoker,    Pulmonary exam normal breath sounds clear to auscultation       Cardiovascular hypertension, + CAD, + Cardiac Stents and + Peripheral Vascular Disease  Normal cardiovascular exam Rhythm:Regular Rate:Normal     Neuro/Psych negative neurological ROS  negative psych ROS   GI/Hepatic negative GI ROS, Neg liver ROS,   Endo/Other  diabetes, Type 2  Renal/GU negative Renal ROS  negative genitourinary   Musculoskeletal negative musculoskeletal ROS (+)   Abdominal   Peds negative pediatric ROS (+)  Hematology negative hematology ROS (+)   Anesthesia Other Findings   Reproductive/Obstetrics negative OB ROS                             Anesthesia Physical Anesthesia Plan  ASA: 3  Anesthesia Plan: General   Post-op Pain Management:    Induction: Intravenous  PONV Risk Score and Plan: 2 and Ondansetron, Dexamethasone and Treatment may vary due to age or medical condition  Airway Management Planned: LMA  Additional Equipment:   Intra-op Plan:   Post-operative Plan: Extubation in OR  Informed Consent: I have reviewed the patients History and Physical, chart, labs and discussed the procedure including the risks, benefits and alternatives for the proposed anesthesia with the patient or authorized representative who has indicated his/her understanding and acceptance.     Dental advisory given  Plan Discussed with: CRNA and Surgeon  Anesthesia Plan Comments:         Anesthesia Quick Evaluation

## 2021-09-25 NOTE — H&P (Signed)
CC: Perineal drainage/lesion, possible hidradenitis  HPI: Anthony [name] is a very pleasant [phone] with hx of HTN, HLD, CAD (s/p stent, no longer on plavix), PAD presents to me for second opinion regarding possible hemorrhoids and rectal bleeding.  He reports a multi-month history of these symptoms.  He has been seen in our practice by 2 of my partners.  He reports that the rectal bleeding and experiences improved since he stopped taking the antiplatelet agent.  He reports that his symptoms are primarily that of rectal bleeding and occasionally some tissue prolapse.  This spontaneously reduces.  He has not consistently take a fiber supplement.  He drinks less than 64 ounces of water per day.  He spends no more than 2-3 minutes on the commode.  He does report variable stool with regards to hard/soft.  He denies any specific severe perianal pain or history of abscess.  He denies any prior anorectal procedures or surgeries.  He underwent colonoscopy with Dr. Ardis Hughs 08/26/20 - this demonstrated 2 diminutive polyps that were removed.  Diverticulosis.  He is now back from his winter trip to [place].  He still has intermittent perineal pains and some drainage from this chronic-type wound.  The only change in his health history since I met the office as a result longer taking Plavix. Cardiologist is Dr. Phillip Heal.  INTERVAL HX He denies any changes in his health or health hx since we met in the office  PMH: HTN, HLD, CAD (s/p stent, no longer on plavix), PAD  PSH: He denies any prior anorectal procedures or surgeries.  Vascular surgery with Dr. [name]  FHx: Denies FHx of breast, endometrial, ovarian or cervical cancer.  He does report that his father had colon cancer in his 30s.  Social: Denies use of tobacco/EtOH/drugs  ROS: A comprehensive 10 system review of systems was completed with the patient and pertinent findings as noted above.   Past Medical History:  Diagnosis Date   Atherosclerosis of  native artery of both lower extremities with intermittent claudication ([place])    followed by vascular , dr [name];  s/p angioplasty and stenting bilaterally 2015 and 11/ 2021 (per lov note 06-08-2021 bilateral iliofemoral intervention's are widely patent)   CAD (coronary artery disease)    cardiologist-- dr [name];  s/p stent placed 06/02/2000 in [place];  s/p cath with patent LCx , PCI w/ DES to pLAD and PCI to POBA of D1   Chronic diastolic (congestive) heart failure ([place])    followed by cardiology   COPD (chronic obstructive pulmonary disease) with chronic bronchitis [facility])    pulmonology-- dr Halford Chessman--  (09-23-2021  per pt checks O2 stats at home, avereage 96-97% on RA)   Edema of left lower extremity    per pt since vasuclar surgery 11/ 2021, but told mild   History of COVID-19 06/2021   per pt mild symtpoms that resolved   Hyperlipidemia    Hypertension    followed by cardiology and pcp   OA (osteoarthritis)    OSA on CPAP    followed by dr Halford Chessman--  study in epic 08-23-2012 severe osa (no additional oxygen)   PAD (peripheral artery disease) ([place])    Peripheral neuropathy    feet   Peripheral vascular disease ([place])    Rosacea    S/P drug eluting coronary stent placement    2001  x1 stent (unsure if DES);  05/ 2020 DES to pLAD   Shortness of breath    with exertion with stairs  but recovers quickly,  ok w/ household chores   Type 2 diabetes mellitus ([place])    followed by pcp   (09-23-2021 per pt check blood sugar at home twice weekly in am,  fasting sugar-- 170)    Past Surgical History:  Procedure Laterality Date   ABDOMINAL AORTAGRAM N/A 11/29/2012   Procedure: ABDOMINAL AORTAGRAM;  Surgeon: Serafina Mitchell, MD;  Location: [facility] CATH LAB;  Service: Cardiovascular;  Laterality: N/A;   ABDOMINAL AORTAGRAM N/A 09/03/2014   Procedure: ABDOMINAL Maxcine Ham;  Surgeon: Serafina Mitchell, MD;  Location: [facility] CATH LAB;  Service: Cardiovascular;  Laterality: N/A;   ABDOMINAL AORTOGRAM W/LOWER  EXTREMITY N/A 09/16/2020   Procedure: ABDOMINAL AORTOGRAM W/LOWER EXTREMITY;  Surgeon: Serafina Mitchell, MD;  Location: [place] CV LAB;  Service: Cardiovascular;  Laterality: N/A;   ANTERIOR CERVICAL DECOMP/DISCECTOMY FUSION  10/2001   C5--C7   ANTERIOR LATERAL LUMBAR FUSION 4 LEVELS Right 05/28/2014   Procedure: Right L4-5 L3-4 L2-3, L1-2  Anterior lateral lumbar fusion with percutaneaous pedicle screws. Lumbar four/five, three/four, two/three and possible two/one;  Surgeon: Erline Levine, MD;  Location: [place] NEURO ORS;  Service: Neurosurgery;  Laterality: Right;  Lumbar One-Five Fusion with Percutaneous Screws   BUNIONECTOMY Right    1991 and [place]  03/26/1999   in [place];  per pt medically managed , no intervention   CARPAL TUNNEL RELEASE Right 09/30/2006   CATARACT EXTRACTION W/ INTRAOCULAR LENS IMPLANT Bilateral 2014   COLONOSCOPY  08/26/2020   CORONARY ANGIOPLASTY WITH STENT PLACEMENT  06/02/2000   in [place]; x1 stent   CORONARY STENT INTERVENTION N/A 05/11/2019   Procedure: CORONARY STENT INTERVENTION;  Surgeon: Burnell Blanks, MD;  Location: [place] CV LAB;  Service: Cardiovascular;  Laterality: N/A;   ENDARTERECTOMY FEMORAL Right 10/10/2014   Procedure: RIGHT FEMORAL ARTERY ENDARTERECTOMY  WITH VASCU GUARD PATCH ANGIOPLASTY;  Surgeon: Serafina Mitchell, MD;  Location: [place];  Service: Vascular;  Laterality: Right;   ENDARTERECTOMY FEMORAL Left 11/05/2020   Procedure: LEFT FEMORAL ENDARTERECTOMY WITH PATCH ANGIOPLASTY;  Surgeon: Serafina Mitchell, MD;  Location: [place];  Service: Vascular;  Laterality: Left;   FEMORAL ENDARTERECTOMY Left 11/05/2020   LEFT FEMORAL ENDARTERECTOMY WITH PATCH ANGIOPLASTY (Left    FINGER ARTHRODESIS Right 09/2009   right thumb   HAMMER TOE SURGERY Left    06/ 2007 and 08/ 2008   INSERTION OF ILIAC STENT Left 11/05/2020    INSERTION OF ILIAC STENT (Left )   INSERTION OF ILIAC STENT Left 11/05/2020   Procedure:  INSERTION OF ILIAC STENT;  Surgeon: Serafina Mitchell, MD;  Location: [place];  Service: Vascular;  Laterality: Left;   INTRAVASCULAR LITHOTRIPSY Left 11/05/2020   Procedure: INTRAVASCULAR LITHOTRIPSY;  Surgeon: Serafina Mitchell, MD;  Location: [place];  Service: Vascular;  Laterality: Left;  shockwave   LUMBAR PERCUTANEOUS PEDICLE SCREW 4 LEVEL N/A 05/28/2014   Procedure: LUMBAR PERCUTANEOUS PEDICLE SCREW 4 LEVEL;  Surgeon: Erline Levine, MD;  Location: [place] NEURO ORS;  Service: Neurosurgery;  Laterality: N/A;   LUMBAR SPINE SURGERY     12/ 1997 and 04/ 2005   NEUROMA SURGERY Right 1996   right foot   PERIPHERAL VASCULAR CATHETERIZATION N/A 12/07/2016   Procedure: Abdominal Aortogram w/Lower Extremity;  Surgeon: Serafina Mitchell, MD;  Location: [place] CV LAB;  Service: Cardiovascular;  Laterality: N/A;   RIGHT/LEFT HEART CATH AND CORONARY ANGIOGRAPHY N/A 05/11/2019   Procedure: RIGHT/LEFT HEART CATH AND CORONARY ANGIOGRAPHY;  Surgeon: [name],  Shaune Pascal, MD;  Location: [place] CV LAB;  Service: Cardiovascular;  Laterality: N/A;   TONSILLECTOMY  1965    Family History  Problem Relation Age of Onset   Heart disease Father 48   Cancer Father    Hyperlipidemia Father    Hypertension Father    Heart attack Father    Cancer Mother    Deep vein thrombosis Mother        Varicose veins   Diabetes Mother    Hyperlipidemia Mother    Hypertension Mother     Social:  reports that he quit smoking about 34 years ago. His smoking use included cigarettes. He has a 56.00 pack-year smoking history. He has never used smokeless tobacco. He reports current alcohol use. He reports that he does not use drugs.  Allergies:  Allergies  Allergen Reactions   Spiriva Respimat [Tiotropium Bromide Monohydrate]     Other reaction(s): Throat irritation    Medications: I have reviewed the patient's current medications.  Results for orders placed or performed during the hospital encounter of 09/25/21 (from  the past 48 hour(s))  CBC WITH DIFFERENTIAL     Status: None   Collection Time: 09/25/21  6:00 AM  Result Value Ref Range   WBC 5.5 4.0 - 10.5 K/uL   RBC 4.76 4.22 - 5.81 MIL/uL   Hemoglobin 14.6 13.0 - 17.0 g/dL   HCT 44.7 39.0 - 52.0 %   MCV 93.9 80.0 - 100.0 fL   MCH 30.7 26.0 - 34.0 pg   MCHC 32.7 30.0 - 36.0 g/dL   RDW 13.5 11.5 - 15.5 %   Platelets 150 150 - 400 K/uL   nRBC 0.0 0.0 - 0.2 %   Neutrophils Relative % 59 %   Neutro Abs 3.3 1.7 - 7.7 K/uL   Lymphocytes Relative 25 %   Lymphs Abs 1.4 0.7 - 4.0 K/uL   Monocytes Relative 11 %   Monocytes Absolute 0.6 0.1 - 1.0 K/uL   Eosinophils Relative 4 %   Eosinophils Absolute 0.2 0.0 - 0.5 K/uL   Basophils Relative 1 %   Basophils Absolute 0.0 0.0 - 0.1 K/uL   Immature Granulocytes 0 %   Abs Immature Granulocytes 0.02 0.00 - 0.07 K/uL    Comment: Performed at [facility], [place] [address], [place], [place] [address]  Basic metabolic panel     Status: Abnormal   Collection Time: 09/25/21  6:00 AM  Result Value Ref Range   Sodium 136 135 - 145 mmol/L   Potassium 5.0 3.5 - 5.1 mmol/L   Chloride 102 98 - 111 mmol/L   CO2 22 22 - 32 mmol/L   Glucose, Bld 200 (H) 70 - 99 mg/dL    Comment: Glucose reference range applies only to samples taken after fasting for at least 8 hours.   BUN 35 (H) 8 - 23 mg/dL   Creatinine, Ser 1.45 (H) 0.61 - 1.24 mg/dL   Calcium 9.1 8.9 - 10.3 mg/dL   GFR, Estimated 50 (L) >60 mL/min    Comment: (NOTE) Calculated using the CKD-EPI Creatinine Equation (2021)    Anion gap 12 5 - 15    Comment: Performed at [facility], [place] [address], [place], [place] [address]    No results found.  ROS - all of the below systems have been reviewed with the patient and positives are indicated with bold text General: chills, fever or night sweats Eyes: blurry vision or double vision ENT: epistaxis  or sore throat Allergy/Immunology: itchy/watery eyes or nasal  congestion Hematologic/Lymphatic: bleeding problems, blood clots or swollen lymph nodes Endocrine: temperature intolerance or unexpected weight changes Breast: new or changing breast lumps or nipple discharge Resp: cough, shortness of breath, or wheezing CV: chest pain or dyspnea on exertion GI: as per HPI GU: dysuria, trouble voiding, or hematuria MSK: joint pain or joint stiffness Neuro: TIA or stroke symptoms Derm: pruritus and skin lesion changes Psych: anxiety and depression  PE Blood pressure 125/68, pulse 74, temperature 98.5 F (36.9 C), temperature source Oral, resp. rate (!) 74, height 5' 8" (1.727 m), weight 101.5 kg, SpO2 94 %. Constitutional: NAD; conversant Eyes: Moist conjunctiva Lungs: Normal respiratory effort CV: RRR; no pitting edema GI: Abd soft, NT/ND; no palpable hepatosplenomegaly MSK: Normal range of motion of extremities; Psychiatric: Appropriate affect; alert and oriented x3  Results for orders placed or performed during the hospital encounter of 09/25/21 (from the past 48 hour(s))  CBC WITH DIFFERENTIAL     Status: None   Collection Time: 09/25/21  6:00 AM  Result Value Ref Range   WBC 5.5 4.0 - 10.5 K/uL   RBC 4.76 4.22 - 5.81 MIL/uL   Hemoglobin 14.6 13.0 - 17.0 g/dL   HCT 44.7 39.0 - 52.0 %   MCV 93.9 80.0 - 100.0 fL   MCH 30.7 26.0 - 34.0 pg   MCHC 32.7 30.0 - 36.0 g/dL   RDW 13.5 11.5 - 15.5 %   Platelets 150 150 - 400 K/uL   nRBC 0.0 0.0 - 0.2 %   Neutrophils Relative % 59 %   Neutro Abs 3.3 1.7 - 7.7 K/uL   Lymphocytes Relative 25 %   Lymphs Abs 1.4 0.7 - 4.0 K/uL   Monocytes Relative 11 %   Monocytes Absolute 0.6 0.1 - 1.0 K/uL   Eosinophils Relative 4 %   Eosinophils Absolute 0.2 0.0 - 0.5 K/uL   Basophils Relative 1 %   Basophils Absolute 0.0 0.0 - 0.1 K/uL   Immature Granulocytes 0 %   Abs Immature Granulocytes 0.02 0.00 - 0.07 K/uL    Comment: Performed at [facility], [place] [address], [place], [place]  [address]  Basic metabolic panel     Status: Abnormal   Collection Time: 09/25/21  6:00 AM  Result Value Ref Range   Sodium 136 135 - 145 mmol/L   Potassium 5.0 3.5 - 5.1 mmol/L   Chloride 102 98 - 111 mmol/L   CO2 22 22 - 32 mmol/L   Glucose, Bld 200 (H) 70 - 99 mg/dL    Comment: Glucose reference range applies only to samples taken after fasting for at least 8 hours.   BUN 35 (H) 8 - 23 mg/dL   Creatinine, Ser 1.45 (H) 0.61 - 1.24 mg/dL   Calcium 9.1 8.9 - 10.3 mg/dL   GFR, Estimated 50 (L) >60 mL/min    Comment: (NOTE) Calculated using the CKD-EPI Creatinine Equation (2021)    Anion gap 12 5 - 15    Comment: Performed at [facility], [place] [address], [place], [place] [address]    No results found.   A/P: Anthony [name] is a very pleasant 46yoM HTN, HLD, CAD (s/p stent, no longer on plavix), PAD - here for evaluation of painless intermittent rectal bleeding. Colonoscopy completed. As things appear intervally changed, I suspect the source of his problems is potentially related to what may be hidradenitis vs ?anal fistula - spontaneous drainage from left perianal  skin when thickened skin is pressed in the anterior midline on perineum at list visit no longer present today Impression: -We spent time reviewing his findings and the plan moving forward. He would like this area excised. We discussed anorectal exam under anesthesia with interrogation of this potentially fistulous process to see if this is actually an anal fistula versus related to what may be hidradenitis. For hidradenitis, excising the involved area and potentially doing a short segment fistulotomy.  -The anatomy and physiology of the anal canal was discussed at length with the patient. The pathophysiology of hidradenitis as well as anal fistulas was discussed again today -Discussed interrogation of wound, possible excision if appears to be hidradenitis vs fistulotomy/seton if actually anal fistula -The planned  procedures, material risks (including, but not limited to, pain, bleeding, infection, scarring, need for blood transfusion, damage to anal sphincter, incontinence of gas and/or stool, need for additional procedures, recurrence, pneumonia, heart attack, stroke, death) benefits and alternatives to surgery were discussed at length. I noted a good probability that the procedure would help improve their symptoms. The patient's questions were answered to their satisfaction, they voiced understanding and elected to proceed with surgery. Additionally, we discussed typical postoperative expectations and the recovery process.  Nadeen Landau, MD [facility] Use [email or web address] to contact on call provider

## 2021-09-25 NOTE — Op Note (Signed)
09/25/2021  8:52 AM  PATIENT:  Anthony [name]  74 y.o. male  Patient Care Team: [name], MD as PCP - General (Internal Medicine) Bensimhon, Shaune Pascal, MD as Attending Physician (Cardiology) [name], MD as Attending Physician (Pulmonary Disease) Michael Boston, MD as Consulting Physician (General Surgery) Milus Banister, MD as Consulting Physician (Gastroenterology)  PRE-OPERATIVE DIAGNOSIS:  Perineal wound  POST-OPERATIVE DIAGNOSIS:  Perineal hidradenitis  PROCEDURE:   Excision of perineal hidradenitis, 9 x 2 cm in size into subcutaneous fat Anorectal exam under anesthesia  SURGEON:  Sharon Mt. Frady Taddeo, MD  ANESTHESIA:   local and general  COUNTS:  Sponge, needle and instrument counts were reported correct x2 at the conclusion of the operation.  EBL: 15 mL  DRAINS: None  SPECIMEN: Perineal hidradenitis  COMPLICATIONS: None  FINDINGS: Perineal wound with multiple punctate openings extending from base of scrotum to anterior anal margin. These were interrogated with hydrogen peroxide and no communication with the anal canal is found. This is clinically consistent therefore with perineal hidradenitis. This was fully excised. No significant purulence, just granulation tissue and chronic wound. This was then able to be closed to decrease wound burden.  DISPOSITION: PACU in satisfactory condition  DESCRIPTION: The patient was identified in preop holding and taken to the OR where he was placed on the operating room table. SCDs were placed. He was placed in lithotomy with Allen stirrups. General endotracheal anesthesia was induced without difficulty. Pressure points were then padded. He was then prepped and draped in the usual sterile fashion. A surgical timeout was performed indicating the correct patient, procedure, positioning and need for preoperative antibiotics.   The perineum was inspected. There is a multiple punctate openings extending from base of scrotum to  anterior anal margin. There area of involvement is 9 x 2 cm in size.  This is suspicious in appearance for hidradenitis.  Given the proximity to the anal area, we opted to proceed with interrogation of the wound to ensure there is no evident communication.  Daily peroxide is then injected using an 18-gauge angiocatheter into one of the punctate opening.  There are bubbles seen emanating from the inferior most aspect of the wound in the anal margin but there is no extravasation within the anal canal.  This was done with an anoscope in place.  The anal canal was otherwise normal with the exception of some hemorrhoids.  The dirty instruments were passed off and maintained on a separate area of the table.  Gloves were changed.  Additional Betadine was sprayed on the perineum.  The area of planned excision was marked.  This is then excised sharply and down into the level of the subcutaneous fat.  There is granulation tissue at multiple spots along the excision consistent with hidradenitis.  This is all included with the specimen.  The specimen was removed in 2 pieces but passed off as 1 specimen.  The perineum was irrigated and hemostasis achieved with electrocautery.  The wound is then closed in 2 layers using 3-0 Vicryl deep dermal sutures followed by a running 4-0 Monocryl subcuticular suture.  Sponge, needle, and instrument counts were reported correct.  Dermabond was carefully applied to assist in keeping the wound clean and dry given its location.  He is taken out of the lithotomy position, awakened from anesthesia, extubated, and transferred to a stretcher for transport to PACU in satisfactory condition

## 2021-09-25 NOTE — Anesthesia Procedure Notes (Signed)
Procedure Name: LMA Insertion Date/Time: 09/25/2021 7:45 AM Performed by: Suan Halter, CRNA Pre-anesthesia Checklist: Patient identified, Emergency Drugs available, Suction available and Patient being monitored Patient Re-evaluated:Patient Re-evaluated prior to induction Oxygen Delivery Method: Circle system utilized Preoxygenation: Pre-oxygenation with 100% oxygen Induction Type: IV induction Ventilation: Mask ventilation without difficulty LMA: LMA inserted LMA Size: 5.0 Number of attempts: 1 Airway Equipment and Method: Bite block Placement Confirmation: positive ETCO2 Tube secured with: Tape Dental Injury: Teeth and Oropharynx as per pre-operative assessment

## 2021-09-25 NOTE — Progress Notes (Signed)
  Subjective:  Patient ID: Anthony [name], male    DOB: Nov 09, 1946,  MRN: [id number]  75 y.o. male presents at risk foot care. Pt has h/o NIDDM with PAD and painful thick toenails that are difficult to trim. Pain interferes with ambulation. Aggravating factors include wearing enclosed shoe gear. Pain is relieved with periodic professional debridement.  Patient states his left great toe feels ingrown even though he has no toenail.  PCP is [name], MD , and last visit was two weeks ago.  Allergies  Allergen Reactions   Spiriva Respimat [Tiotropium Bromide Monohydrate]     Other reaction(s): Throat irritation    Review of Systems: Negative except as noted in the HPI.   Objective:   Constitutional Pt is a pleasant 75 y.o. Caucasian male in NAD. AAO x 3.   Vascular Capillary fill time to digits <3 seconds b/l lower extremities. Palpable PT pulse(s) right lower extremity Faintly palpable DP pulse(s) b/l lower extremities. Nonpalpable PT pulse(s) left lower extremity. Pedal hair present. Lower extremity skin temperature gradient within normal limits.  Neurologic Protective sensation diminished with 10g monofilament b/l.  Dermatologic Pedal skin is thin shiny, atrophic b/l lower extremities. No open wounds b/l lower extremities No interdigital macerations b/l lower extremities Toenails L 3rd toe, L 4th toe, L 5th toe, R 3rd toe, R 4th toe, and R 5th toe elongated, discolored, dystrophic, thickened, and crumbly with subungual debris and tenderness to dorsal palpation. Anonychia noted L hallux, L 2nd toe, R hallux, and R 2nd toe. Nailbed(s) epithelialized.  Hyperkeratotic lesion(s) R 3rd toe.  No erythema, no edema, no drainage, no fluctuance.  Orthopedic: Normal muscle strength 5/5 to all lower extremity muscle groups bilaterally. No pain crepitus or joint limitation noted with ROM b/l. Hammertoe(s) noted to the 2-5 bilaterally.   Radiographs: None Assessment:   1. Pain due to onychomycosis of  toenails of both feet   2. Corns   3. Type II diabetes mellitus with peripheral circulatory disorder (HCC)    Plan:  -Examined patient. -Continue diabetic foot care principles. -Patient to continue soft, supportive shoe gear daily. -Toenails L 3rd toe, L 4th toe, L 5th toe, R 3rd toe, R 4th toe, and R 5th toe debrided in length and girth without iatrogenic bleeding with sterile nail nipper and dremel.  -Corn(s) R 3rd toe pared utilizing sterile scalpel blade without complication or incident. Total number debrided=1. -Patient to report any pedal injuries to medical professional immediately. -Patient/POA to call should there be question/concern in the interim.  Return in about 3 months (around 12/23/2021).  Marzetta Board, DPM

## 2021-09-25 NOTE — Anesthesia Postprocedure Evaluation (Signed)
Anesthesia Post Note  Patient: JERVIS TRAPANI  Procedure(s) Performed: EXCISION HIDRADENITIS, INTERROGATION OF PERINEAL WOUND (Perineum) ANORECTAL EXAM UNDER ANESTHESIA (Anus)     Patient location during evaluation: PACU Anesthesia Type: General Level of consciousness: awake and alert Pain management: pain level controlled Vital Signs Assessment: post-procedure vital signs reviewed and stable Respiratory status: spontaneous breathing, nonlabored ventilation, respiratory function stable and patient connected to nasal cannula oxygen Cardiovascular status: blood pressure returned to baseline and stable Postop Assessment: no apparent nausea or vomiting Anesthetic complications: no   No notable events documented.  Last Vitals:  Vitals:   09/25/21 0847 09/25/21 0900  BP: (!) 112/54 120/66  Pulse: 71 70  Resp: 18 17  Temp: 36.4 C   SpO2: 96% 98%    Last Pain:  Vitals:   09/25/21 0847  TempSrc:   PainSc: 0-No pain                 Jolan Mealor,Twan S

## 2021-09-28 ENCOUNTER — Encounter (HOSPITAL_BASED_OUTPATIENT_CLINIC_OR_DEPARTMENT_OTHER): Payer: Self-pay | Admitting: Surgery

## 2021-09-28 LAB — SURGICAL PATHOLOGY

## 2021-10-23 ENCOUNTER — Other Ambulatory Visit: Payer: Self-pay | Admitting: Pain Medicine

## 2021-10-23 DIAGNOSIS — M5416 Radiculopathy, lumbar region: Secondary | ICD-10-CM

## 2021-12-30 ENCOUNTER — Ambulatory Visit: Payer: Medicare Other | Admitting: Podiatry

## 2022-01-04 ENCOUNTER — Ambulatory Visit (INDEPENDENT_AMBULATORY_CARE_PROVIDER_SITE_OTHER): Payer: Medicare Other | Admitting: Surgery

## 2022-01-04 ENCOUNTER — Ambulatory Visit (INDEPENDENT_AMBULATORY_CARE_PROVIDER_SITE_OTHER)
Admission: RE | Admit: 2022-01-04 | Discharge: 2022-01-04 | Disposition: A | Discharge status: Home / Self Care | Payer: Medicare Other | Source: Ambulatory Visit | Attending: Surgery | Admitting: Surgery

## 2022-01-04 ENCOUNTER — Encounter: Payer: Self-pay | Admitting: Surgery

## 2022-01-04 ENCOUNTER — Other Ambulatory Visit: Payer: Self-pay | Admitting: *Deleted

## 2022-01-04 ENCOUNTER — Ambulatory Visit (HOSPITAL_COMMUNITY)
Admission: RE | Admit: 2022-01-04 | Discharge: 2022-01-04 | Disposition: A | Discharge status: Home / Self Care | Payer: Medicare Other | Source: Ambulatory Visit | Attending: Surgery | Admitting: Surgery

## 2022-01-04 ENCOUNTER — Other Ambulatory Visit: Payer: Self-pay

## 2022-01-04 VITALS — BP 107/66 | HR 64 | Temp 98.2°F | Resp 20 | Ht 68.0 in | Wt 228.0 lb

## 2022-01-04 DIAGNOSIS — I739 Peripheral vascular disease, unspecified: Secondary | ICD-10-CM | POA: Diagnosis not present

## 2022-01-04 DIAGNOSIS — Z8249 Family history of ischemic heart disease and other diseases of the circulatory system: Secondary | ICD-10-CM | POA: Diagnosis not present

## 2022-01-04 DIAGNOSIS — Z87891 Personal history of nicotine dependence: Secondary | ICD-10-CM | POA: Diagnosis not present

## 2022-01-04 DIAGNOSIS — I2510 Atherosclerotic heart disease of native coronary artery without angina pectoris: Secondary | ICD-10-CM | POA: Diagnosis not present

## 2022-01-04 DIAGNOSIS — I70213 Atherosclerosis of native arteries of extremities with intermittent claudication, bilateral legs: Secondary | ICD-10-CM

## 2022-01-04 DIAGNOSIS — I10 Essential (primary) hypertension: Secondary | ICD-10-CM | POA: Insufficient documentation

## 2022-01-04 DIAGNOSIS — E1151 Type 2 diabetes mellitus with diabetic peripheral angiopathy without gangrene: Secondary | ICD-10-CM | POA: Insufficient documentation

## 2022-01-04 DIAGNOSIS — E785 Hyperlipidemia, unspecified: Secondary | ICD-10-CM | POA: Diagnosis not present

## 2022-01-04 NOTE — Progress Notes (Signed)
[facility] [place]  Patient name: Anthony [name] MRN: [id number] DOB: [birth year] Sex: male   REASON FOR VISIT:    Follow-up  HISOTRY OF PRESENT ILLNESS:   Anthony [name] is a 76 y.o. male who Is status post left iliofemoral endarterectomy with bovine patch angioplasty x 2 and lithotripsy and stenting of his left iliac on 11-05-2020.  This was done for severe claudication and an occluded left femoral artery and severe claudication   He is also status post right external iliac, common femoral, superficial femoral, and profunda femoral endarterectomy with bovine pericardial patch and plasty on 10/10/2014.  This was done for lifestyle limiting claudication which failed conservative management.   He does not have symptoms that limit his daily activities.  His legs will get tired if he overdoes it.  He does not have any active ulcers.  The patient suffers from diabetes.   He is a former smoker.  He takes a statin for hypercholesterolemia.  He is on an ARB for hypertension.    PAST MEDICAL HISTORY:   Past Medical History:  Diagnosis Date   Atherosclerosis of native artery of both lower extremities with intermittent claudication ([place])    followed by vascular , dr [name];  s/p angioplasty and stenting bilaterally 2015 and 11/ 2021 (per lov note 06-08-2021 bilateral iliofemoral intervention's are widely patent)   CAD (coronary artery disease)    cardiologist-- dr [name];  s/p stent placed 06/02/2000 in [place];  s/p cath with patent LCx , PCI w/ DES to pLAD and PCI to POBA of D1   Chronic diastolic (congestive) heart failure ([place])    followed by cardiology   COPD (chronic obstructive pulmonary disease) with chronic bronchitis [facility])    pulmonology-- dr Halford Chessman--  (09-23-2021  per pt checks O2 stats at home, [place] 96-97% on RA)   Edema of left lower extremity    per pt since vasuclar surgery 11/ 2021, but told mild   History of  COVID-19 06/2021   per pt mild symtpoms that resolved   Hyperlipidemia    Hypertension    followed by cardiology and pcp   OA (osteoarthritis)    OSA on CPAP    followed by dr Halford Chessman--  study in epic 08-23-2012 severe osa (no additional oxygen)   PAD (peripheral artery disease) ([place])    Peripheral neuropathy    feet   Peripheral vascular disease ([place])    Rosacea    S/P drug eluting coronary stent placement    2001  x1 stent (unsure if DES);  05/ 2020 DES to pLAD   Shortness of breath    with exertion with stairs but recovers quickly,  ok w/ household chores   Type 2 diabetes mellitus ([place])    followed by pcp   (09-23-2021 per pt check blood sugar at home twice weekly in am,  fasting sugar-- 170)     FAMILY HISTORY:   Family History  Problem Relation Age of Onset   Heart disease Father 21   Cancer Father    Hyperlipidemia Father    Hypertension Father    Heart attack Father    Cancer Mother    Deep vein thrombosis Mother        Varicose veins   Diabetes Mother    Hyperlipidemia Mother    Hypertension Mother     SOCIAL HISTORY:   Social History   Tobacco Use   Smoking status: Former    Packs/day: 2.00  Years: 28.00    Pack years: 56.00    Types: Cigarettes    Quit date: 08/03/1987    Years since quitting: 34.4   Smokeless tobacco: Never  Substance Use Topics   Alcohol use: Yes    Comment: weekly     ALLERGIES:   Allergies  Allergen Reactions   Spiriva Respimat [Tiotropium Bromide Monohydrate]     Other reaction(s): Throat irritation     CURRENT MEDICATIONS:   Current Outpatient Medications  Medication Sig Dispense Refill   Albuterol Sulfate, sensor, (PROAIR DIGIHALER) 108 (90 Base) MCG/ACT AEPB Inhale into the lungs as needed.     ASPIRIN LOW DOSE 81 MG EC tablet Take 81 mg by mouth every evening.      BENFOTIAMINE PO Take 250 mg by mouth 2 (two) times daily.     bisoprolol (ZEBETA) 5 MG tablet Take 1 tablet by mouth at bedtime.      carboxymethylcellulose (REFRESH PLUS) 0.5 % SOLN Place 1 drop into both eyes in the morning, at noon, in the evening, and at bedtime.     CINNAMON PO Take 1,000 mg by mouth every evening.      doxazosin (CARDURA) 2 MG tablet Take 2 mg by mouth every evening.     empagliflozin (JARDIANCE) 10 MG TABS tablet Take 10 mg by mouth daily.     fish oil-omega-3 fatty acids 1000 MG capsule Take 2 g by mouth daily.     fluticasone (FLONASE) 50 MCG/ACT nasal spray Place 2 sprays into both nostrils at bedtime as needed for allergies or rhinitis.      furosemide (LASIX) 20 MG tablet Take 20 mg by mouth daily at 12 noon.      GARLIC PO Take 0,932 mg by mouth daily.      ibuprofen (ADVIL) 200 MG tablet Take 400 mg by mouth every 6 (six) hours as needed for headache or moderate pain.      isosorbide mononitrate (IMDUR) 30 MG 24 hr tablet TAKE 1 TABLET BY MOUTH  DAILY (Patient taking differently: Take 30 mg by mouth daily.) 90 tablet 3   L-Lysine 1000 MG TABS Take 1,000 mg by mouth daily.     losartan (COZAAR) 100 MG tablet Take 100 mg by mouth daily.     metFORMIN (GLUCOPHAGE) 1000 MG tablet Take 1,000 mg by mouth 2 (two) times daily with a meal.     Multiple Vitamin (MULTIVITAMIN) tablet Take 1 tablet by mouth daily.     pentoxifylline (TRENTAL) 400 MG CR tablet TAKE 1 TABLET BY MOUTH  TWICE DAILY (Patient taking differently: Take 400 mg by mouth in the morning and at bedtime.) 180 tablet 3   potassium chloride SA (K-DUR,KLOR-CON) 20 MEQ tablet Take 20 mEq by mouth 2 (two) times daily.     rOPINIRole (REQUIP) 1 MG tablet Take 1 mg by mouth every evening.      Semaglutide (OZEMPIC, 1 MG/DOSE, [place]) Inject 1 mg into the skin every Thursday.     simvastatin (ZOCOR) 40 MG tablet Take 40 mg by mouth every evening.     SUPER B COMPLEX/C PO Take 1 tablet by mouth daily.     umeclidinium-vilanterol (ANORO ELLIPTA) 62.5-25 MCG/INH AEPB INHALE 1 INHALATION BY  MOUTH INTO THE LUNGS DAILY (Patient taking differently: Inhale 1  puff into the lungs daily. INHALE 1 INHALATION BY  MOUTH INTO THE LUNGS DAILY) 180 each 3   UNABLE TO FIND Med Name: Theotis Burrow 1 tablet daily     nitroGLYCERIN (NITROSTAT)  0.4 MG SL tablet Place 1 tablet (0.4 mg total) under the tongue every 5 (five) minutes as needed for chest pain. (Patient taking differently: Place 0.4 mg under the tongue every 5 (five) minutes as needed for chest pain.) 90 tablet 3   No current facility-administered medications for this visit.    REVIEW OF SYSTEMS:   [X]  denotes positive finding, [ ]  denotes negative finding Cardiac  Comments:  Chest pain or chest pressure:    Shortness of breath upon exertion:    Short of breath when lying flat:    Irregular heart rhythm:        Vascular    Pain in calf, thigh, or hip brought on by ambulation: x   Pain in feet at night that wakes you up from your sleep:     Blood clot in your veins:    Leg swelling:         Pulmonary    Oxygen at home:    Productive cough:     Wheezing:         Neurologic    Sudden weakness in arms or legs:     Sudden numbness in arms or legs:     Sudden onset of difficulty speaking or slurred speech:    Temporary loss of vision in one eye:     Problems with dizziness:         Gastrointestinal    Blood in stool:     Vomited blood:         Genitourinary    Burning when urinating:     Blood in urine:        Psychiatric    Major depression:         Hematologic    Bleeding problems:    Problems with blood clotting too easily:        Skin    Rashes or ulcers:        Constitutional    Fever or chills:      PHYSICAL EXAM:   Vitals:   01/04/22 0921 01/04/22 0924  BP: (!) 95/57 107/66  Pulse: 64   Resp: 20   Temp: 98.2 F (36.8 C)   SpO2: 94%   Weight: 228 lb (103.4 kg)   Height: 5\' 8"  (1.727 m)     GENERAL: The patient is a well-nourished male, in no acute distress. The vital signs are documented above. CARDIAC: There is a regular rate and rhythm.  VASCULAR:  Nonpalpable pedal pulses PULMONARY: Non-labored respirations ABDOMEN: Soft and non-tender with normal pitched bowel sounds.  MUSCULOSKELETAL: There are no major deformities or cyanosis. NEUROLOGIC: No focal weakness or paresthesias are detected. SKIN: There are no ulcers or rashes noted. PSYCHIATRIC: The patient has a normal affect.  STUDIES:   I have reviewed the following: ABI Findings:  +---------+------------------+-----+--------+--------+   Right     Rt Pressure (mmHg) Index Waveform Comment    +---------+------------------+-----+--------+--------+   Brachial  106                                          +---------+------------------+-----+--------+--------+   PTA       82                 0.73  biphasic            +---------+------------------+-----+--------+--------+   DP  75                 0.67  biphasic            +---------+------------------+-----+--------+--------+   Great Toe 46                 0.41                      +---------+------------------+-----+--------+--------+   +---------+------------------+-----+---------+-------+   Left      Lt Pressure (mmHg) Index Waveform  Comment   +---------+------------------+-----+---------+-------+   Brachial  112                                          +---------+------------------+-----+---------+-------+   PTA       118                1.05  triphasic           +---------+------------------+-----+---------+-------+   DP        114                1.02  biphasic            +---------+------------------+-----+---------+-------+   Great Toe 140                1.25                      +---------+------------------+-----+---------+-------+   Location            Stent         +-------------------+-----------+   Left Common Iliac   no stenosis   +-------------------+-----------+   Left External Iliac no stenosis   +-------------------+-----------+   Carotid: Right Carotid: Velocities in the right ICA are consistent  with a 40-59%                 stenosis.   Left Carotid: Velocities in the left ICA are consistent with a 1-39%  stenosis.   Vertebrals:  Bilateral vertebral arteries demonstrate antegrade flow.  Subclavians: Normal flow hemodynamics were seen in bilateral subclavian               arteries.  MEDICAL ISSUES:   Claudication: Patient's symptoms remained stable.  He will follow-up in 1 year with repeat aortoiliac duplex and ABIs.  Carotid: He has moderate right-sided stenosis which is asymptomatic.  He will follow-up in 1 year with repeat duplex.    Leia Alf, MD, FACS [facility] [phone] Pager [phone]

## 2022-06-14 ENCOUNTER — Telehealth: Payer: Self-pay

## 2022-06-14 NOTE — Telephone Encounter (Signed)
Pt came into office to present PA paperwork for [facility] to be completed by Dr. Halford Chessman. [facility] covers [place] but pt states he still losses his voice in the afternoon and prefers Anoro. Pt states documentation stating tried and failed inhalers must be faxed with PA paperwork from our office to [place]. Pt would also like call updating fax has been sent. Routing to Dr. Halford Chessman and Eustace Quail for follow up

## 2022-06-15 ENCOUNTER — Encounter: Payer: Self-pay | Admitting: Pulmonary Disease

## 2022-06-15 NOTE — Telephone Encounter (Signed)
Letter printed and signed, and ready to be faxed to the [place].  Please inform Mr. Blyden when this gets faxed.

## 2022-06-16 NOTE — Telephone Encounter (Signed)
April did this letter get faxed to [facility]? Have you talked to patient?

## 2022-06-16 NOTE — Telephone Encounter (Signed)
Called and spoke to patient this afternoon and informed him that the letter from Dr Halford Chessman along with his paperwork was faxed off this morning. Patient verbalized understanding. Nothing further needed

## 2022-08-04 ENCOUNTER — Telehealth: Payer: Self-pay

## 2022-08-04 ENCOUNTER — Ambulatory Visit (INDEPENDENT_AMBULATORY_CARE_PROVIDER_SITE_OTHER): Payer: Medicare Other

## 2022-08-04 ENCOUNTER — Ambulatory Visit (INDEPENDENT_AMBULATORY_CARE_PROVIDER_SITE_OTHER): Payer: Medicare Other | Admitting: Orthopaedic Surgery

## 2022-08-04 DIAGNOSIS — M1712 Unilateral primary osteoarthritis, left knee: Secondary | ICD-10-CM

## 2022-08-04 MED ORDER — BUPIVACAINE HCL 0.25 % IJ SOLN
2.0000 mL | INTRAMUSCULAR | Status: AC | PRN
  Administered 2022-08-04: 2 mL via INTRA_ARTICULAR

## 2022-08-04 MED ORDER — LIDOCAINE HCL 1 % IJ SOLN
2.0000 mL | INTRAMUSCULAR | Status: AC | PRN
  Administered 2022-08-04: 2 mL

## 2022-08-04 MED ORDER — METHYLPREDNISOLONE ACETATE 40 MG/ML IJ SUSP
40.0000 mg | INTRAMUSCULAR | Status: AC | PRN
  Administered 2022-08-04: 40 mg via INTRA_ARTICULAR

## 2022-08-04 NOTE — Telephone Encounter (Signed)
Can we get this patient approved for L knee gel injection

## 2022-08-04 NOTE — Progress Notes (Signed)
Office Visit Note   Patient: Anthony [name]           Date of Birth: Nov 26, 1946           MRN: [id number] Visit Date: 08/04/2022              Requested by: [name], [address],  [place] [address] PCP: [name], MD   Assessment & Plan: Visit Diagnoses:  1. Primary osteoarthritis of left knee     Plan: Impression is left knee osteoarthritis flareup.  Today, we discussed various treatment options to include repeat cortisone injection versus viscosupplementation injection.  He would like to undergo cortisone injection today and get approval for repeat viscosupplementation injection to the left knee.  He will follow-up with [place] once approved.  Call with concerns or questions in meantime.  This patient is diagnosed with osteoarthritis of the knee(s).    Radiographs show evidence of joint space narrowing, osteophytes, subchondral sclerosis and/or subchondral cysts.  This patient has knee pain which interferes with functional and activities of daily living.    This patient has experienced inadequate response, adverse effects and/or intolerance with conservative treatments such as acetaminophen, NSAIDS, topical creams, physical therapy or regular exercise, knee bracing and/or weight loss.   This patient has experienced inadequate response or has a contraindication to intra articular steroid injections for at least 3 months.   This patient is not scheduled to have a total knee replacement within 6 months of starting treatment with viscosupplementation.   Follow-Up Instructions: Return for once approved for visco inj.   Orders:  Orders Placed This Encounter  Procedures   Large Joint Inj: L knee   XR KNEE 3 VIEW LEFT   No orders of the defined types were placed in this encounter.     Procedures: Large Joint Inj: L knee on 08/04/2022 8:40 AM Indications: pain Details: 22 G needle, anterolateral approach Medications: 2 mL lidocaine 1 %; 2 mL bupivacaine 0.25 %; 40  mg methylPREDNISolone acetate 40 MG/ML      Clinical Data: No additional findings.   Subjective: Chief Complaint  Patient presents with   Left Knee - Pain    HPI patient is a pleasant 76 year old gentleman who comes in today with left knee pain.  History of underlying osteoarthritis.  He has been seen by [place] in the past where he has undergone cortisone as well as viscosupplementation injection.  Viscosupplementation injection has provided great relief.  He has noticed increased pain over the past week while on vacation.  The pain is primarily to the medial aspect and is worse with movement of the knee.  He denies mechanical symptoms.  He has been using Voltaren gel and taking Tylenol which has helped.  He is going to [place] 1 September would like repeat injection prior to his departure.  Review of Systems as detailed in HPI.  All others reviewed and are negative.   Objective: Vital Signs: There were no vitals taken for this visit.  Physical Exam well-developed well-nourished gentleman in no acute distress.  Alert and oriented x 3.  Ortho Exam left knee exam shows no effusion.  Range of motion 0 to 120 degrees.  No joint line tenderness.  Moderate patellofemoral crepitus.  He is neurovascular intact distally.  Specialty Comments:  No specialty comments available.  Imaging: No new imaging   PMFS History: Patient Active Problem List   Diagnosis Date Noted   Type 2 diabetes mellitus ([place]) 09/23/2021   Renal  insufficiency 09/23/2021   Primary osteoarthritis of left knee 11/27/2020   Femoral artery occlusion, left ([place]) 11/05/2020   Unilateral primary osteoarthritis, left knee 07/10/2019   Bleeding external hemorrhoids suspected 06/07/2019   Long term current use of clopidogrel 06/07/2019   Unstable angina (HCC)    Bilateral primary osteoarthritis of knee 10/11/2017   Chronic diastolic heart failure ([place]) 05/06/2015   Femoral artery stenosis, right ([place]) 10/10/2014    Preoperative clearance 09/09/2014   Lumbar spine scoliosis 05/28/2014   Pain in limb 12/10/2013   Chest tightness 09/27/2013   HTN (hypertension) 09/27/2013   Peripheral vascular disease, unspecified ([place]) 01/08/2013   PAD (peripheral artery disease) ([place]) 11/13/2012   Atherosclerosis of native arteries of the extremities with intermittent claudication 11/13/2012   Bilateral claudication of lower limb ([place]) 10/09/2012   Coronary atherosclerosis of native coronary artery 08/26/2012   COPD (chronic obstructive pulmonary disease) ([place]) 08/11/2012   OSA (obstructive sleep apnea) 08/11/2012   Diaphragm paralysis 08/11/2012   Morbid obesity with BMI of 40.0-44.9, adult ([place]) 08/11/2012   Past Medical History:  Diagnosis Date   Atherosclerosis of native artery of both lower extremities with intermittent claudication ([place])    followed by vascular , dr [name];  s/p angioplasty and stenting bilaterally 2015 and 11/ 2021 (per lov note 06-08-2021 bilateral iliofemoral intervention's are widely patent)   CAD (coronary artery disease)    cardiologist-- dr [name];  s/p stent placed 06/02/2000 in [place];  s/p cath with patent LCx , PCI w/ DES to pLAD and PCI to POBA of D1   Chronic diastolic (congestive) heart failure ([place])    followed by cardiology   COPD (chronic obstructive pulmonary disease) with chronic bronchitis [facility])    pulmonology-- dr Halford Chessman--  (09-23-2021  per pt checks O2 stats at home, avereage 96-97% on RA)   Edema of left lower extremity    per pt since vasuclar surgery 11/ 2021, but told mild   History of COVID-19 06/2021   per pt mild symtpoms that resolved   Hyperlipidemia    Hypertension    followed by cardiology and pcp   OA (osteoarthritis)    OSA on CPAP    followed by dr Halford Chessman--  study in epic 08-23-2012 severe osa (no additional oxygen)   PAD (peripheral artery disease) ([place])    Peripheral neuropathy    feet   Peripheral vascular disease ([place])    Rosacea    S/P drug  eluting coronary stent placement    2001  x1 stent (unsure if DES);  05/ 2020 DES to pLAD   Shortness of breath    with exertion with stairs but recovers quickly,  ok w/ household chores   Type 2 diabetes mellitus ([place])    followed by pcp   (09-23-2021 per pt check blood sugar at home twice weekly in am,  fasting sugar-- 170)    Family History  Problem Relation Age of Onset   Heart disease Father 36   Cancer Father    Hyperlipidemia Father    Hypertension Father    Heart attack Father    Cancer Mother    Deep vein thrombosis Mother        Varicose veins   Diabetes Mother    Hyperlipidemia Mother    Hypertension Mother     Past Surgical History:  Procedure Laterality Date   ABDOMINAL AORTAGRAM N/A 11/29/2012   Procedure: ABDOMINAL Maxcine Ham;  Surgeon: Serafina Mitchell, MD;  Location: [facility] CATH LAB;  Service: Cardiovascular;  Laterality: N/A;   ABDOMINAL AORTAGRAM N/A 09/03/2014   Procedure: ABDOMINAL AORTAGRAM;  Surgeon: Serafina Mitchell, MD;  Location: [facility] CATH LAB;  Service: Cardiovascular;  Laterality: N/A;   ABDOMINAL AORTOGRAM W/LOWER EXTREMITY N/A 09/16/2020   Procedure: ABDOMINAL AORTOGRAM W/LOWER EXTREMITY;  Surgeon: Serafina Mitchell, MD;  Location: [place] CV LAB;  Service: Cardiovascular;  Laterality: N/A;   ANTERIOR CERVICAL DECOMP/DISCECTOMY FUSION  10/2001   C5--C7   ANTERIOR LATERAL LUMBAR FUSION 4 LEVELS Right 05/28/2014   Procedure: Right L4-5 L3-4 L2-3, L1-2  Anterior lateral lumbar fusion with percutaneaous pedicle screws. Lumbar four/five, three/four, two/three and possible two/one;  Surgeon: Erline Levine, MD;  Location: [place] NEURO ORS;  Service: Neurosurgery;  Laterality: Right;  Lumbar One-Five Fusion with Percutaneous Screws   BUNIONECTOMY Right    1991 and [place]  03/26/1999   in [place];  per pt medically managed , no intervention   CARPAL TUNNEL RELEASE Right 09/30/2006   CATARACT EXTRACTION W/ INTRAOCULAR LENS IMPLANT Bilateral 2014    COLONOSCOPY  08/26/2020   CORONARY ANGIOPLASTY WITH STENT PLACEMENT  06/02/2000   in [place]; x1 stent   CORONARY STENT INTERVENTION N/A 05/11/2019   Procedure: CORONARY STENT INTERVENTION;  Surgeon: Burnell Blanks, MD;  Location: [place] CV LAB;  Service: Cardiovascular;  Laterality: N/A;   ENDARTERECTOMY FEMORAL Right 10/10/2014   Procedure: RIGHT FEMORAL ARTERY ENDARTERECTOMY  WITH VASCU GUARD PATCH ANGIOPLASTY;  Surgeon: Serafina Mitchell, MD;  Location: [place];  Service: Vascular;  Laterality: Right;   ENDARTERECTOMY FEMORAL Left 11/05/2020   Procedure: LEFT FEMORAL ENDARTERECTOMY WITH PATCH ANGIOPLASTY;  Surgeon: Serafina Mitchell, MD;  Location: [place];  Service: Vascular;  Laterality: Left;   FEMORAL ENDARTERECTOMY Left 11/05/2020   LEFT FEMORAL ENDARTERECTOMY WITH PATCH ANGIOPLASTY (Left    FINGER ARTHRODESIS Right 09/2009   right thumb   HAMMER TOE SURGERY Left    06/ 2007 and 08/ 2008   HYDRADENITIS EXCISION N/A 09/25/2021   Procedure: EXCISION HIDRADENITIS, INTERROGATION OF PERINEAL WOUND;  Surgeon: Ileana Roup, MD;  Location: [place];  Service: General;  Laterality: N/A;   INSERTION OF ILIAC STENT Left 11/05/2020    INSERTION OF ILIAC STENT (Left )   INSERTION OF ILIAC STENT Left 11/05/2020   Procedure: INSERTION OF ILIAC STENT;  Surgeon: Serafina Mitchell, MD;  Location: [place];  Service: Vascular;  Laterality: Left;   INTRAVASCULAR LITHOTRIPSY Left 11/05/2020   Procedure: INTRAVASCULAR LITHOTRIPSY;  Surgeon: Serafina Mitchell, MD;  Location: [place];  Service: Vascular;  Laterality: Left;  shockwave   LUMBAR PERCUTANEOUS PEDICLE SCREW 4 LEVEL N/A 05/28/2014   Procedure: LUMBAR PERCUTANEOUS PEDICLE SCREW 4 LEVEL;  Surgeon: Erline Levine, MD;  Location: [place] NEURO ORS;  Service: Neurosurgery;  Laterality: N/A;   LUMBAR SPINE SURGERY     12/ 1997 and 04/ 2005   NEUROMA SURGERY Right 1996   right foot   PERIPHERAL VASCULAR CATHETERIZATION N/A  12/07/2016   Procedure: Abdominal Aortogram w/Lower Extremity;  Surgeon: Serafina Mitchell, MD;  Location: [place] CV LAB;  Service: Cardiovascular;  Laterality: N/A;   RECTAL EXAM UNDER ANESTHESIA N/A 09/25/2021   Procedure: ANORECTAL EXAM UNDER ANESTHESIA;  Surgeon: Ileana Roup, MD;  Location: [place];  Service: General;  Laterality: N/A;   RIGHT/LEFT HEART CATH AND CORONARY ANGIOGRAPHY N/A 05/11/2019   Procedure: RIGHT/LEFT HEART CATH AND CORONARY ANGIOGRAPHY;  Surgeon: Jolaine Artist, MD;  Location: [place] CV LAB;  Service: Cardiovascular;  Laterality: N/A;   TONSILLECTOMY  1965   Social History   Occupational History   Occupation: retired  Tobacco Use   Smoking status: Former    Packs/day: 2.00    Years: 28.00    Total pack years: 56.00    Types: Cigarettes    Quit date: 08/03/1987    Years since quitting: 35.0   Smokeless tobacco: Never  Vaping Use   Vaping Use: Never used  Substance and Sexual Activity   Alcohol use: Yes    Comment: weekly   Drug use: No   Sexual activity: Yes

## 2022-08-05 NOTE — Telephone Encounter (Signed)
VOB submitted for SynviscOne, left knee  

## 2022-08-20 ENCOUNTER — Other Ambulatory Visit: Payer: Self-pay

## 2022-08-20 DIAGNOSIS — M1712 Unilateral primary osteoarthritis, left knee: Secondary | ICD-10-CM

## 2022-08-25 ENCOUNTER — Encounter: Payer: Self-pay | Admitting: Orthopaedic Surgery

## 2022-08-25 ENCOUNTER — Ambulatory Visit (INDEPENDENT_AMBULATORY_CARE_PROVIDER_SITE_OTHER): Payer: Medicare Other | Admitting: Orthopaedic Surgery

## 2022-08-25 DIAGNOSIS — M1712 Unilateral primary osteoarthritis, left knee: Secondary | ICD-10-CM | POA: Diagnosis not present

## 2022-08-25 MED ORDER — BUPIVACAINE HCL 0.25 % IJ SOLN
2.0000 mL | INTRAMUSCULAR | Status: AC | PRN
  Administered 2022-08-25: 2 mL via INTRA_ARTICULAR

## 2022-08-25 MED ORDER — SODIUM HYALURONATE 60 MG/3ML IX PRSY
60.0000 mg | PREFILLED_SYRINGE | INTRA_ARTICULAR | Status: AC | PRN
  Administered 2022-08-25: 60 mg via INTRA_ARTICULAR

## 2022-08-25 MED ORDER — LIDOCAINE HCL 1 % IJ SOLN
2.0000 mL | INTRAMUSCULAR | Status: AC | PRN
Start: 2022-08-25 — End: 2022-08-25
  Administered 2022-08-25: 2 mL

## 2022-08-25 NOTE — Progress Notes (Signed)
Office Visit Note   Patient: Anthony [name]           Date of Birth: 14-Jul-1946           MRN: [id number] Visit Date: 08/25/2022              Requested by: [name], [address],  [place] [address] PCP: [name], MD   Assessment & Plan: Visit Diagnoses:  1. Unilateral primary osteoarthritis, left knee     Plan: Impression is left knee osteoarthritis.  Today, proceeded with left knee Durolane injection.  He tolerated this well.  We will follow-up with [place] as needed. Durolane injection: Lot [id number], expiration date 11/25/2024  Follow-Up Instructions: Return if symptoms worsen or fail to improve.   Orders:  No orders of the defined types were placed in this encounter.  No orders of the defined types were placed in this encounter.     Procedures: Large Joint Inj: L knee on 08/25/2022 8:50 AM Indications: pain Details: 22 G needle, anterolateral approach Medications: 2 mL lidocaine 1 %; 2 mL bupivacaine 0.25 %; 60 mg Sodium Hyaluronate 60 MG/3ML      Clinical Data: No additional findings.   Subjective: Chief Complaint  Patient presents with   Left Knee - Follow-up    Durolane    HPI patient is a very pleasant 76 year old gentleman who comes in today for left knee Durolane injection.  His last viscosupplementation injection helped for approximately 2 years.  He recently underwent cortisone injection of the left knee which is helped as well.     Objective: Vital Signs: There were no vitals taken for this visit.    Ortho Exam stable left knee exam  Specialty Comments:  No specialty comments available.  Imaging: No new imaging   PMFS History: Patient Active Problem List   Diagnosis Date Noted   Type 2 diabetes mellitus ([place]) 09/23/2021   Renal insufficiency 09/23/2021   Primary osteoarthritis of left knee 11/27/2020   Femoral artery occlusion, left ([place]) 11/05/2020   Unilateral primary osteoarthritis, left knee 07/10/2019   Bleeding  external hemorrhoids suspected 06/07/2019   Long term current use of clopidogrel 06/07/2019   Unstable angina (HCC)    Bilateral primary osteoarthritis of knee 10/11/2017   Chronic diastolic heart failure ([place]) 05/06/2015   Femoral artery stenosis, right ([place]) 10/10/2014   Preoperative clearance 09/09/2014   Lumbar spine scoliosis 05/28/2014   Pain in limb 12/10/2013   Chest tightness 09/27/2013   HTN (hypertension) 09/27/2013   Peripheral vascular disease, unspecified ([place]) 01/08/2013   PAD (peripheral artery disease) ([place]) 11/13/2012   Atherosclerosis of native arteries of the extremities with intermittent claudication 11/13/2012   Bilateral claudication of lower limb ([place]) 10/09/2012   Coronary atherosclerosis of native coronary artery 08/26/2012   COPD (chronic obstructive pulmonary disease) ([place]) 08/11/2012   OSA (obstructive sleep apnea) 08/11/2012   Diaphragm paralysis 08/11/2012   Morbid obesity with BMI of 40.0-44.9, adult ([place]) 08/11/2012   Past Medical History:  Diagnosis Date   Atherosclerosis of native artery of both lower extremities with intermittent claudication ([place])    followed by vascular , dr [name];  s/p angioplasty and stenting bilaterally 2015 and 11/ 2021 (per lov note 06-08-2021 bilateral iliofemoral intervention's are widely patent)   CAD (coronary artery disease)    cardiologist-- dr [name];  s/p stent placed 06/02/2000 in [place];  s/p cath with patent LCx , PCI w/ DES to pLAD and PCI to POBA of D1  Chronic diastolic (congestive) heart failure (HCC)    followed by cardiology   COPD (chronic obstructive pulmonary disease) with chronic bronchitis [facility])    pulmonology-- dr Halford Chessman--  (09-23-2021  per pt checks O2 stats at home, avereage 96-97% on RA)   Edema of left lower extremity    per pt since vasuclar surgery 11/ 2021, but told mild   History of COVID-19 06/2021   per pt mild symtpoms that resolved   Hyperlipidemia    Hypertension    followed by  cardiology and pcp   OA (osteoarthritis)    OSA on CPAP    followed by dr Halford Chessman--  study in epic 08-23-2012 severe osa (no additional oxygen)   PAD (peripheral artery disease) ([place])    Peripheral neuropathy    feet   Peripheral vascular disease ([place])    Rosacea    S/P drug eluting coronary stent placement    2001  x1 stent (unsure if DES);  05/ 2020 DES to pLAD   Shortness of breath    with exertion with stairs but recovers quickly,  ok w/ household chores   Type 2 diabetes mellitus ([place])    followed by pcp   (09-23-2021 per pt check blood sugar at home twice weekly in am,  fasting sugar-- 170)    Family History  Problem Relation Age of Onset   Heart disease Father 63   Cancer Father    Hyperlipidemia Father    Hypertension Father    Heart attack Father    Cancer Mother    Deep vein thrombosis Mother        Varicose veins   Diabetes Mother    Hyperlipidemia Mother    Hypertension Mother     Past Surgical History:  Procedure Laterality Date   ABDOMINAL AORTAGRAM N/A 11/29/2012   Procedure: ABDOMINAL Maxcine Ham;  Surgeon: Serafina Mitchell, MD;  Location: [facility] CATH LAB;  Service: Cardiovascular;  Laterality: N/A;   ABDOMINAL AORTAGRAM N/A 09/03/2014   Procedure: ABDOMINAL Maxcine Ham;  Surgeon: Serafina Mitchell, MD;  Location: [facility] CATH LAB;  Service: Cardiovascular;  Laterality: N/A;   ABDOMINAL AORTOGRAM W/LOWER EXTREMITY N/A 09/16/2020   Procedure: ABDOMINAL AORTOGRAM W/LOWER EXTREMITY;  Surgeon: Serafina Mitchell, MD;  Location: [place] CV LAB;  Service: Cardiovascular;  Laterality: N/A;   ANTERIOR CERVICAL DECOMP/DISCECTOMY FUSION  10/2001   C5--C7   ANTERIOR LATERAL LUMBAR FUSION 4 LEVELS Right 05/28/2014   Procedure: Right L4-5 L3-4 L2-3, L1-2  Anterior lateral lumbar fusion with percutaneaous pedicle screws. Lumbar four/five, three/four, two/three and possible two/one;  Surgeon: Erline Levine, MD;  Location: [place] NEURO ORS;  Service: Neurosurgery;  Laterality: Right;  Lumbar  One-Five Fusion with Percutaneous Screws   BUNIONECTOMY Right    1991 and [place]  03/26/1999   in [place];  per pt medically managed , no intervention   CARPAL TUNNEL RELEASE Right 09/30/2006   CATARACT EXTRACTION W/ INTRAOCULAR LENS IMPLANT Bilateral 2014   COLONOSCOPY  08/26/2020   CORONARY ANGIOPLASTY WITH STENT PLACEMENT  06/02/2000   in [place]; x1 stent   CORONARY STENT INTERVENTION N/A 05/11/2019   Procedure: CORONARY STENT INTERVENTION;  Surgeon: Burnell Blanks, MD;  Location: [place] CV LAB;  Service: Cardiovascular;  Laterality: N/A;   ENDARTERECTOMY FEMORAL Right 10/10/2014   Procedure: RIGHT FEMORAL ARTERY ENDARTERECTOMY  WITH VASCU GUARD PATCH ANGIOPLASTY;  Surgeon: Serafina Mitchell, MD;  Location: [place];  Service: Vascular;  Laterality: Right;   ENDARTERECTOMY FEMORAL Left 11/05/2020  Procedure: LEFT FEMORAL ENDARTERECTOMY WITH PATCH ANGIOPLASTY;  Surgeon: Serafina Mitchell, MD;  Location: [place];  Service: Vascular;  Laterality: Left;   FEMORAL ENDARTERECTOMY Left 11/05/2020   LEFT FEMORAL ENDARTERECTOMY WITH PATCH ANGIOPLASTY (Left    FINGER ARTHRODESIS Right 09/2009   right thumb   HAMMER TOE SURGERY Left    06/ 2007 and 08/ 2008   HYDRADENITIS EXCISION N/A 09/25/2021   Procedure: EXCISION HIDRADENITIS, INTERROGATION OF PERINEAL WOUND;  Surgeon: Ileana Roup, MD;  Location: [place];  Service: General;  Laterality: N/A;   INSERTION OF ILIAC STENT Left 11/05/2020    INSERTION OF ILIAC STENT (Left )   INSERTION OF ILIAC STENT Left 11/05/2020   Procedure: INSERTION OF ILIAC STENT;  Surgeon: Serafina Mitchell, MD;  Location: [place];  Service: Vascular;  Laterality: Left;   INTRAVASCULAR LITHOTRIPSY Left 11/05/2020   Procedure: INTRAVASCULAR LITHOTRIPSY;  Surgeon: Serafina Mitchell, MD;  Location: [facility] [place];  Service: Vascular;  Laterality: Left;  shockwave   LUMBAR PERCUTANEOUS PEDICLE SCREW 4 LEVEL N/A 05/28/2014    Procedure: LUMBAR PERCUTANEOUS PEDICLE SCREW 4 LEVEL;  Surgeon: Erline Levine, MD;  Location: [place] NEURO ORS;  Service: Neurosurgery;  Laterality: N/A;   LUMBAR SPINE SURGERY     12/ 1997 and 04/ 2005   NEUROMA SURGERY Right 1996   right foot   PERIPHERAL VASCULAR CATHETERIZATION N/A 12/07/2016   Procedure: Abdominal Aortogram w/Lower Extremity;  Surgeon: Serafina Mitchell, MD;  Location: [place] CV LAB;  Service: Cardiovascular;  Laterality: N/A;   RECTAL EXAM UNDER ANESTHESIA N/A 09/25/2021   Procedure: ANORECTAL EXAM UNDER ANESTHESIA;  Surgeon: Ileana Roup, MD;  Location: [facility];  Service: General;  Laterality: N/A;   RIGHT/LEFT HEART CATH AND CORONARY ANGIOGRAPHY N/A 05/11/2019   Procedure: RIGHT/LEFT HEART CATH AND CORONARY ANGIOGRAPHY;  Surgeon: Jolaine Artist, MD;  Location: [place] CV LAB;  Service: Cardiovascular;  Laterality: N/A;   TONSILLECTOMY  1965   Social History   Occupational History   Occupation: retired  Tobacco Use   Smoking status: Former    Packs/day: 2.00    Years: 28.00    Total pack years: 56.00    Types: Cigarettes    Quit date: 08/03/1987    Years since quitting: 35.0   Smokeless tobacco: Never  Vaping Use   Vaping Use: Never used  Substance and Sexual Activity   Alcohol use: Yes    Comment: weekly   Drug use: No   Sexual activity: Yes

## 2022-12-23 ENCOUNTER — Ambulatory Visit (INDEPENDENT_AMBULATORY_CARE_PROVIDER_SITE_OTHER): Payer: Medicare Other | Admitting: Pulmonary Disease

## 2022-12-23 ENCOUNTER — Encounter (HOSPITAL_BASED_OUTPATIENT_CLINIC_OR_DEPARTMENT_OTHER): Payer: Self-pay | Admitting: Pulmonary Disease

## 2022-12-23 VITALS — BP 110/68 | HR 67 | Temp 98.2°F | Ht 68.0 in | Wt 222.6 lb

## 2022-12-23 DIAGNOSIS — J449 Chronic obstructive pulmonary disease, unspecified: Secondary | ICD-10-CM

## 2022-12-23 NOTE — Progress Notes (Signed)
[facility]  Chief Complaint  Patient presents with   Follow-up    Pt states no new changes since LOV.    Constitutional:  BP 110/68 (BP Location: Left Arm, Patient Position: Sitting, Cuff Size: Normal)   Pulse 67   Temp 98.2 F (36.8 C) (Oral)   Ht '5\' 8"'$  (1.727 m)   Wt 222 lb 9.6 oz (101 kg)   SpO2 96%   BMI 33.85 kg/m   Past Medical History:  CAD, HLD, combined CHF, PAD, HTN, DM, Lumbar stenosis  Past Surgical History:  He  has a past surgical history that includes Tonsillectomy (1965); Bunionectomy (Right); Neuroma surgery (Right, 1996); Anterior cervical decomp/discectomy fusion (10/2001); Hammer toe surgery (Left); Carpal tunnel release (Right, 09/30/2006); Finger arthodesis (Right, 09/2009); Lumbar spine surgery; Anterior lateral lumbar fusion 4 levels (Right, 05/28/2014); Lumbar percutaneous pedicle screw 4 level (N/A, 05/28/2014); Cataract extraction w/ intraocular lens implant (Bilateral, 2014); Endarterectomy femoral (Right, 10/10/2014); abdominal aortagram (N/A, 11/29/2012); abdominal aortagram (N/A, 09/03/2014); Cardiac catheterization (N/A, 12/07/2016); RIGHT/LEFT HEART CATH AND CORONARY ANGIOGRAPHY (N/A, 05/11/2019); CORONARY STENT INTERVENTION (N/A, 05/11/2019); Colonoscopy (08/26/2020); ABDOMINAL AORTOGRAM W/LOWER EXTREMITY (N/A, 09/16/2020); Coronary angioplasty with stent (06/02/2000); Femoral endarterectomy (Left, 11/05/2020); Insertion of iliac stent (Left, 11/05/2020); Endarterectomy femoral (Left, 11/05/2020); Insertion of iliac stent (Left, 11/05/2020); INTRAVASCULAR LITHOTRIPSY (Left, 11/05/2020); Cardiac catheterization (03/26/1999); Hydradenitis excision (N/A, 09/25/2021); and Rectal exam under anesthesia (N/A, 09/25/2021).  Brief Summary:  Anthony [name] is a 76 y.o. male former smoker with COPD, OSA, and rt diaphragm elevation.      Subjective:   He has switched to getting CPAP supplies through the [place].  He  reports that revisit visit showed good control of sleep apnea.  He uses anoro daily.  This helps.  Not having cough, wheeze, or sputum.  Hasn't needed albuterol much.  Gets winded sometimes with activity, but recovers quickly.  Physical Exam:   Appearance - well kempt   ENMT - no sinus tenderness, no oral exudate, no LAN, Mallampati 3 airway, no stridor  Respiratory - equal breath sounds bilaterally, no wheezing or rales  CV - s1s2 regular rate and rhythm, no murmurs  Ext - no clubbing, no edema  Skin - no rashes  Psych - normal mood and affect   Pulmonary testing:  PFT 08/13/12>>FEV1 1.83 (62%), FEV1% 64, TLC 5.17 (82%), DLCO 67%, +BD PFT 11/28/19 >> FEV1 2.30 (78%), FEV1% 75, TLC 5.84 (85%), DLCO 70%, no BD  Chest Imaging:  SNIFF test 08/14/12>>Paradoxical motion of the right hemidiaphragm with inspiration  Sleep Tests:  PSG 08/14/12>>AHI 38.5.  CPAP 13 cm H2O with 1 liter oxygen ONO with CPAP and RA 01/18/13 >> test time 10 hrs 37 min. Mean SpO2 94%, low SpO2 86%. Spent 16 sec with SpO2 < 88% CPAP 06/14/20 to 07/13/20 >> used on 30 of 30 nights with average 9 hrs 20 min.  Average AHI 0.9 with CPAP 13 cm H2O.  Cardiac Tests:  RHC/LHC 05/11/19 >> RA 4, RV 27/5, PA 28/4(15), PCW 13, CI 2.2, PVR < 1 WU; 2v CAD, chronic proximal RCA total occlusion, angioplasty to diagonal branch, DES to proximal LAD Echo 08/15/19 >> EF 55 to 60%, mild LVH  Social History:  He  reports that he quit smoking about 35 years ago. His smoking use included cigarettes. He has a 56.00 pack-year smoking history. He has never used smokeless tobacco. He reports current alcohol use. He reports that he does not use drugs.  Family History:  His family history includes Cancer in his father and mother; Deep vein thrombosis in his mother; Diabetes in his mother; Heart attack in his father; Heart disease (age of onset: 69) in his father; Hyperlipidemia in his father and mother; Hypertension in his father and mother.      Assessment/Plan:   COPD with chronic bronchitis. - intolerant of spiriva respimat due to throat irritation - continue anoro one puff daily - prn albuterol  Obstructive sleep apnea. - he is compliant with CPAP and reports benefit from therapy - was getting supplies through [employer], but switched to the [place] - he travels frequently, and uses a travel CPAP for this - current CPAP ordered December 2019 - continue CPAP 13 cm H2O   Rt hemidiaphragm elevation. - continue CPAP at night  CAD, chronic diastolic CHF. - followed by Dr. [name] with cardiology   Peripheral artery disease. - followed by Dr. [name] with vascular surgery  Time Spent Involved in Patient Care on Day of Examination:  26 minutes  Follow up:   Patient Instructions  Follow up in 1 year  Medication List:   Allergies as of 12/23/2022       Reactions   Spiriva Respimat [tiotropium Bromide Monohydrate]    Other reaction(s): Throat irritation        Medication List        Accurate as of December 23, 2022  8:43 AM. If you have any questions, ask your nurse or doctor.          STOP taking these medications    carboxymethylcellulose 0.5 % Soln Commonly known as: REFRESH PLUS Stopped by: [name], MD   empagliflozin 10 MG Tabs tablet Commonly known as: JARDIANCE Stopped by: [name], MD   OZEMPIC (1 MG/DOSE)  Stopped by: [name], MD   UNABLE TO FIND Stopped by: [name], MD       TAKE these medications    Anoro Ellipta 62.5-25 MCG/ACT Aepb Generic drug: umeclidinium-vilanterol INHALE 1 INHALATION BY  MOUTH INTO THE LUNGS DAILY What changed:  how much to take how to take this when to take this   Aspirin Low Dose 81 MG tablet Generic drug: aspirin EC Take 81 mg by mouth every evening.   BENFOTIAMINE PO Take 250 mg by mouth 2 (two) times daily.   bisoprolol 5 MG tablet Commonly known as: ZEBETA Take 1 tablet by mouth at bedtime.   CINNAMON PO Take 1,000 mg  by mouth every evening.   doxazosin 2 MG tablet Commonly known as: CARDURA Take 2 mg by mouth every evening.   doxycycline 50 MG tablet Commonly known as: ADOXA Take 50 mg by mouth daily.   fish oil-omega-3 fatty acids 1000 MG capsule Take 2 g by mouth daily.   fluticasone 50 MCG/ACT nasal spray Commonly known as: FLONASE Place 2 sprays into both nostrils at bedtime as needed for allergies or rhinitis.   furosemide 20 MG tablet Commonly known as: LASIX Take 20 mg by mouth daily at 12 noon.   GARLIC PO Take 1,610 mg by mouth daily.   ibuprofen 200 MG tablet Commonly known as: ADVIL Take 400 mg by mouth every 6 (six) hours as needed for headache or moderate pain.   isosorbide mononitrate 30 MG 24 hr tablet Commonly known as: IMDUR TAKE 1 TABLET BY MOUTH  DAILY   L-Lysine 1000 MG Tabs Take 1,000 mg by mouth daily.   losartan 100 MG tablet Commonly known as: COZAAR Take 100 mg by mouth  daily.   metFORMIN 1000 MG tablet Commonly known as: GLUCOPHAGE Take 1,000 mg by mouth 2 (two) times daily with a meal.   multivitamin tablet Take 1 tablet by mouth daily.   nitroGLYCERIN 0.4 MG SL tablet Commonly known as: NITROSTAT Place 1 tablet (0.4 mg total) under the tongue every 5 (five) minutes as needed for chest pain.   pentoxifylline 400 MG CR tablet Commonly known as: TRENTAL TAKE 1 TABLET BY MOUTH  TWICE DAILY What changed: when to take this   potassium chloride SA 20 MEQ tablet Commonly known as: KLOR-CON M Take 20 mEq by mouth 2 (two) times daily.   ProAir Digihaler 108 (90 Base) MCG/ACT Aepb Generic drug: Albuterol Sulfate (sensor) Inhale into the lungs as needed.   rOPINIRole 1 MG tablet Commonly known as: REQUIP Take 1 mg by mouth every evening.   simvastatin 40 MG tablet Commonly known as: ZOCOR Take 40 mg by mouth every evening.   SUPER B COMPLEX/C PO Take 1 tablet by mouth daily.        Signature:  [name], MD [place] Pager - [phone] 12/23/2022, 8:43 AM

## 2022-12-23 NOTE — Patient Instructions (Signed)
Follow up in 1 year.

## 2023-01-19 ENCOUNTER — Ambulatory Visit (HOSPITAL_COMMUNITY)
Admission: RE | Admit: 2023-01-19 | Discharge: 2023-01-19 | Disposition: A | Discharge status: Home / Self Care | Payer: Medicare Other | Source: Ambulatory Visit | Attending: Internal Medicine | Admitting: Internal Medicine

## 2023-01-19 ENCOUNTER — Encounter (HOSPITAL_COMMUNITY): Payer: Self-pay | Admitting: Internal Medicine

## 2023-01-19 VITALS — BP 102/60 | HR 69 | Wt 225.2 lb

## 2023-01-19 DIAGNOSIS — I5032 Chronic diastolic (congestive) heart failure: Secondary | ICD-10-CM | POA: Diagnosis not present

## 2023-01-19 DIAGNOSIS — I739 Peripheral vascular disease, unspecified: Secondary | ICD-10-CM

## 2023-01-19 DIAGNOSIS — I2510 Atherosclerotic heart disease of native coronary artery without angina pectoris: Secondary | ICD-10-CM | POA: Diagnosis not present

## 2023-01-19 NOTE — Progress Notes (Addendum)
Acute Heart Failure Clinic Note    Date:  01/19/2023   ID:  Anthony [name], DOB August 20, 1946, MRN [id number]  Location: Home  Provider location: [place] [facility] Type of Visit: Established patient  PCP:  [name], MD  Cardiologist:  None Primary HF: [name]  Chief Complaint: Heart Failure follow-up   History of Present Illness:  Anthony [name] is a 77 y.o. male with h/o obesity, HTN, HL, severe osteoarthritis, DM2 (diagnosed 2009), OSA, former smoker (quit 1989), CAD s/p stent 2001 and PAD. He is the father-in-law of Dr. [name] in Interventional Radiology.    Had cath in [place] in 2001. Was told he had 70% blockage in one artery and the one in the back was totally blocked. Eventually underwent stenting.    Has severe PAD R>L followed by Dr. [name]. Now status post right external iliac, common femoral, superficial femoral endarterectomy with bovine pericardial patch angioplasty on 10/10/2014  Saw Dr. Halford Chessman and placed on home O2 but now weaned off with inhalers. Checks sats with pulse oximeter    Echo 8/20 EF 50% RV ok. Personally reviewed   Was having more SOB and some CP. Underwent repeat cath 05/11/19  1. Left dominant coronary system with 2V CAD 2. Small dominant RCA with chronic proximal total occlusion 3. Widely patent dominant LCX 4. High grade 95% proximal LAD likely within or just prior to previous stent. Probable chronic occlusion of distal LAD but not seen well -> PCI/DES to prox LAD and POBA of D1 5. LVEF 60-65% with normal filling pressures 6. Normal right heart pressures   11/21 Had extensive endarterectomy of left external iliac, CFA and SFA with patch angioplasty and stenting.   Sleep study with severe OS - AHI 39.    Here for routine f/u. Doing well. Followed by [facility] LE PAD is stable. Main limitation to activity now is DOE. Gets SOB at 1 block or less. Uses a walker or cane. This is stable. No CP. No edema, orthopnea or PND.  Wears CPAP regularly. Follows with Dr. Halford Chessman in Pulmonary   PFT 11/28/19 >> FEV1 2.30 (78%), FEV1% 75, TLC 5.84 (85%), DLCO 70%, no BD    Past Medical History:  Diagnosis Date   Atherosclerosis of native artery of both lower extremities with intermittent claudication ([place])    followed by vascular , dr [name];  s/p angioplasty and stenting bilaterally 2015 and 11/ 2021 (per lov note 06-08-2021 bilateral iliofemoral intervention's are widely patent)   CAD (coronary artery disease)    cardiologist-- dr [name];  s/p stent placed 06/02/2000 in [place];  s/p cath with patent LCx , PCI w/ DES to pLAD and PCI to POBA of D1   Chronic diastolic (congestive) heart failure ([place])    followed by cardiology   COPD (chronic obstructive pulmonary disease) with chronic bronchitis    pulmonology-- dr Halford Chessman--  (09-23-2021  per pt checks O2 stats at home, [place] 96-97% on RA)   Edema of left lower extremity    per pt since vasuclar surgery 11/ 2021, but told mild   History of COVID-19 06/2021   per pt mild symtpoms that resolved   Hyperlipidemia    Hypertension    followed by cardiology and pcp   OA (osteoarthritis)    OSA on CPAP    followed by dr Halford Chessman--  study in epic 08-23-2012 severe osa (no additional oxygen)   PAD (peripheral artery disease) ([place])    Peripheral neuropathy  feet   Peripheral vascular disease ([facility])    Rosacea    S/P drug eluting coronary stent placement    2001  x1 stent (unsure if DES);  05/ 2020 DES to pLAD   Shortness of breath    with exertion with stairs but recovers quickly,  ok w/ household chores   Type 2 diabetes mellitus ([place])    followed by pcp   (09-23-2021 per pt check blood sugar at home twice weekly in am,  fasting sugar-- 170)   Past Surgical History:  Procedure Laterality Date   ABDOMINAL AORTAGRAM N/A 11/29/2012   Procedure: ABDOMINAL AORTAGRAM;  Surgeon: Serafina Mitchell, MD;  [facility] LAB;  Service: Cardiovascular;  Laterality: N/A;    ABDOMINAL AORTAGRAM N/A 09/03/2014   Procedure: ABDOMINAL Maxcine Ham;  Surgeon: Serafina Mitchell, MD;  Location: [facility] CATH LAB;  Service: Cardiovascular;  Laterality: N/A;   ABDOMINAL AORTOGRAM W/LOWER EXTREMITY N/A 09/16/2020   Procedure: ABDOMINAL AORTOGRAM W/LOWER EXTREMITY;  Surgeon: Serafina Mitchell, MD;  Location: [facility] CV LAB;  Service: Cardiovascular;  Laterality: N/A;   ANTERIOR CERVICAL DECOMP/DISCECTOMY FUSION  10/2001   C5--C7   ANTERIOR LATERAL LUMBAR FUSION 4 LEVELS Right 05/28/2014   Procedure: Right L4-5 L3-4 L2-3, L1-2  Anterior lateral lumbar fusion with percutaneaous pedicle screws. Lumbar four/five, three/four, two/three and possible two/one;  Surgeon: Erline Levine, MD;  Location: [facility]URO ORS;  Service: Neurosurgery;  Laterality: Right;  Lumbar One-Five Fusion with Percutaneous Screws   BUNIONECTOMY Right    1991 and [place]  03/26/1999   in [place];  per pt medically managed , no intervention   CARPAL TUNNEL RELEASE Right 09/30/2006   CATARACT EXTRACTION W/ INTRAOCULAR LENS IMPLANT Bilateral 2014   COLONOSCOPY  08/26/2020   CORONARY ANGIOPLASTY WITH STENT PLACEMENT  06/02/2000   in [place]; x1 stent   CORONARY STENT INTERVENTION N/A 05/11/2019   Procedure: CORONARY STENT INTERVENTION;  Surgeon: Burnell Blanks, MD;  Location: [facility] CV LAB;  Service: Cardiovascular;  Laterality: N/A;   ENDARTERECTOMY FEMORAL Right 10/10/2014   Procedure: RIGHT FEMORAL ARTERY ENDARTERECTOMY  WITH VASCU GUARD PATCH ANGIOPLASTY;  Surgeon: Serafina Mitchell, MD;  Location: [facility];  Service: Vascular;  Laterality: Right;   ENDARTERECTOMY FEMORAL Left 11/05/2020   Procedure: LEFT FEMORAL ENDARTERECTOMY WITH PATCH ANGIOPLASTY;  Surgeon: Serafina Mitchell, MD;  Location: [facility];  Service: Vascular;  Laterality: Left;   FEMORAL ENDARTERECTOMY Left 11/05/2020   LEFT FEMORAL ENDARTERECTOMY WITH PATCH ANGIOPLASTY (Left    FINGER ARTHRODESIS Right 09/2009   right  thumb   HAMMER TOE SURGERY Left    06/ 2007 and 08/ 2008   HYDRADENITIS EXCISION N/A 09/25/2021   Procedure: EXCISION HIDRADENITIS, INTERROGATION OF PERINEAL WOUND;  Surgeon: Ileana Roup, MD;  Location: [facility] [place];  Service: General;  Laterality: N/A;   INSERTION OF ILIAC STENT Left 11/05/2020    INSERTION OF ILIAC STENT (Left )   INSERTION OF ILIAC STENT Left 11/05/2020   Procedure: INSERTION OF ILIAC STENT;  Surgeon: Serafina Mitchell, MD;  Location: [facility];  Service: Vascular;  Laterality: Left;   INTRAVASCULAR LITHOTRIPSY Left 11/05/2020   Procedure: INTRAVASCULAR LITHOTRIPSY;  Surgeon: Serafina Mitchell, MD;  Location: [facility];  Service: Vascular;  Laterality: Left;  shockwave   LUMBAR PERCUTANEOUS PEDICLE SCREW 4 LEVEL N/A 05/28/2014   Procedure: LUMBAR PERCUTANEOUS PEDICLE SCREW 4 LEVEL;  Surgeon: Erline Levine, MD;  Location: [facility];  Service: Neurosurgery;  Laterality: N/A;   LUMBAR SPINE SURGERY     12/ 1997 and 04/ 2005   NEUROMA SURGERY Right 1996   right foot   PERIPHERAL VASCULAR CATHETERIZATION N/A 12/07/2016   Procedure: Abdominal Aortogram w/Lower Extremity;  Surgeon: Serafina Mitchell, MD;  Location: [facility] CV LAB;  Service: Cardiovascular;  Laterality: N/A;   RECTAL EXAM UNDER ANESTHESIA N/A 09/25/2021   Procedure: ANORECTAL EXAM UNDER ANESTHESIA;  Surgeon: Ileana Roup, MD;  Location: [facility];  Service: General;  Laterality: N/A;   RIGHT/LEFT HEART CATH AND CORONARY ANGIOGRAPHY N/A 05/11/2019   Procedure: RIGHT/LEFT HEART CATH AND CORONARY ANGIOGRAPHY;  Surgeon: Jolaine Artist, MD;  Location: [facility] CV LAB;  Service: Cardiovascular;  Laterality: N/A;   TONSILLECTOMY  1965     Current Outpatient Medications  Medication Sig Dispense Refill   Albuterol Sulfate, sensor, (PROAIR DIGIHALER) 108 (90 Base) MCG/ACT AEPB Inhale into the lungs as needed.     ASPIRIN LOW DOSE 81 MG EC tablet Take 81 mg by mouth every  evening.      BENFOTIAMINE PO Take 250 mg by mouth 2 (two) times daily.     bisoprolol (ZEBETA) 5 MG tablet Take 1 tablet by mouth at bedtime.     CINNAMON PO Take 1,000 mg by mouth every evening.      doxazosin (CARDURA) 2 MG tablet Take 2 mg by mouth every evening.     doxycycline (ADOXA) 50 MG tablet Take 50 mg by mouth daily.     empagliflozin (JARDIANCE) 25 MG TABS tablet Take 12.5 mg by mouth daily.     fish oil-omega-3 fatty acids 1000 MG capsule Take 2 g by mouth daily.     fluticasone (FLONASE) 50 MCG/ACT nasal spray Place 2 sprays into both nostrils at bedtime as needed for allergies or rhinitis.      furosemide (LASIX) 20 MG tablet Take 20 mg by mouth daily at 12 noon.      GARLIC PO Take 5,956 mg by mouth daily.      ibuprofen (ADVIL) 200 MG tablet Take 400 mg by mouth every 6 (six) hours as needed for headache or moderate pain.      isosorbide mononitrate (IMDUR) 30 MG 24 hr tablet TAKE 1 TABLET BY MOUTH  DAILY 90 tablet 3   L-Lysine 1000 MG TABS Take 1,000 mg by mouth daily.     losartan (COZAAR) 100 MG tablet Take 100 mg by mouth daily.     metFORMIN (GLUCOPHAGE) 1000 MG tablet Take 1,000 mg by mouth 2 (two) times daily with a meal.     Multiple Vitamin (MULTIVITAMIN) tablet Take 1 tablet by mouth daily.     nitroGLYCERIN (NITROSTAT) 0.4 MG SL tablet Place 1 tablet (0.4 mg total) under the tongue every 5 (five) minutes as needed for chest pain. 90 tablet 3   pentoxifylline (TRENTAL) 400 MG CR tablet TAKE 1 TABLET BY MOUTH  TWICE DAILY 180 tablet 3   Potassium Chloride ER 20 MEQ TBCR Take 20 mEq by mouth daily.     rOPINIRole (REQUIP) 1 MG tablet Take 1 mg by mouth every evening.      Semaglutide (OZEMPIC, 1 MG/DOSE, [place]) Inject 2 mg into the skin once a week.     simvastatin (ZOCOR) 40 MG tablet Take 40 mg by mouth every evening.     SUPER B COMPLEX/C PO Take 1 tablet by mouth daily.     umeclidinium-vilanterol (ANORO ELLIPTA) 62.5-25 MCG/INH AEPB INHALE 1  INHALATION BY  MOUTH  INTO THE LUNGS DAILY 180 each 3   No current facility-administered medications for this encounter.    Allergies:   Spiriva respimat [tiotropium bromide monohydrate]   Social History:  The patient  reports that he quit smoking about 35 years ago. His smoking use included cigarettes. He has a 56.00 pack-year smoking history. He has never used smokeless tobacco. He reports current alcohol use. He reports that he does not use drugs.   Family History:  The patient's family history includes Cancer in his father and mother; Deep vein thrombosis in his mother; Diabetes in his mother; Heart attack in his father; Heart disease (age of onset: 75) in his father; Hyperlipidemia in his father and mother; Hypertension in his father and mother.   ROS:  Please see the history of present illness.   All other systems are personally reviewed and negative.   Exam:  General:  Well appearing. No resp difficulty HEENT: normal Neck: supple. no JVD. Carotids 2+ bilat; no bruits. No lymphadenopathy or thryomegaly appreciated. Cor: PMI nondisplaced. Regular rate & rhythm. No rubs, gallops or murmurs. Lungs: Barrel chested clear Abdomen: soft, nontender, nondistended. No hepatosplenomegaly. No bruits or masses. Good bowel sounds. Extremities: no cyanosis, clubbing, rash, edema Neuro: alert & orientedx3, cranial nerves grossly intact. moves all 4 extremities w/o difficulty. Affect pleasant   ECG: NSR 70 1AVB anterolateral Qs Personally reviewed   Recent Labs: No results found for requested labs within last 365 days.  Personally reviewed   Wt Readings from Last 3 Encounters:  01/19/23 102.2 kg (225 lb 3.2 oz)  12/23/22 101 kg (222 lb 9.6 oz)  01/04/22 103.4 kg (228 lb)      ASSESSMENT AND PLAN:  1) CAD - Last cath 05/11/19 with PCI LAD with DES and POBA of D1 - Cont ASA/statin - No s/s of angina - Lipids followed by Dr. Virgina Jock. Goal LDL < 70  2) Chronic Diastolic HF-   - EF 58-09% with mild RV  dysfunction on ECHO 5/16. - Echo 8/20 EF 50% RV ok. Personally reviewed - NYHA II-III. Volume looks good - Continue lasix 20 mg daily - Continue Jardiance - Due for repeat echo  3) PAD - s/p R femoral artery endarterectomy in 2015. - 11/21 Had extensive endarterectomy of left external iliac, CFA and SFA with patch angioplasty and stenting.  - stable followed by Dr. [name]  5) HTN - Blood pressure well controlled. Continue current regimen.  6) OSA on CPAP -Compliantwith CPAP  7) Hyperlipidemia - Followed by Dr. Virgina Jock. Continue statin. Goal LDL < 70  8) COPD - Follows with Dr Halford Chessman  9). CKD 3a - Scr 1.3-1.4 - Continue Jardiance - recent labs with PCP ok     Signed, Glori Bickers, MD  01/19/2023 10:58 AM  Advanced Heart Failure [place] [address] [phone] (office) [phone] (fax)

## 2023-01-19 NOTE — Patient Instructions (Signed)
There has been no changes to your medications.  Your physician has requested that you have an echocardiogram. Echocardiography is a painless test that uses sound waves to create images of your heart. It provides your doctor with information about the size and shape of your heart and how well your heart's chambers and valves are working. This procedure takes approximately one hour. There are no restrictions for this procedure. Please do NOT wear cologne, perfume, aftershave, or lotions (deodorant is allowed). Please arrive 15 minutes prior to your appointment time.  Your physician recommends that you schedule a follow-up appointment in: 1 year ( January 2025)  ** please call the office in November 2024 to arrange your follow up appointment.**  If you have any questions or concerns before your next appointment please send [place] a message through [place] or call our office at [phone].    TO LEAVE A MESSAGE FOR THE NURSE SELECT OPTION 2, PLEASE LEAVE A MESSAGE INCLUDING: YOUR NAME DATE OF BIRTH CALL BACK NUMBER REASON FOR CALL**this is important as we prioritize the call backs  YOU WILL RECEIVE A CALL BACK THE SAME DAY AS LONG AS YOU CALL BEFORE 4:00 PM  At the [facility], you and your health needs are our priority. As part of our continuing mission to provide you with exceptional heart care, we have created designated Provider Care Teams. These Care Teams include your primary Cardiologist (physician) and Advanced Practice Providers (APPs- Physician Assistants and Nurse Practitioners) who all work together to provide you with the care you need, when you need it.   You may see any of the following providers on your designated Care Team at your next follow up: Dr Glori Bickers Dr Loralie Champagne Dr. Roxana Hires, NP Lyda Jester, [place] [facility], [place] Forestine Na, NP Audry Riles, PharmD   Please be sure to bring in all your  medications bottles to every appointment.

## 2023-02-02 ENCOUNTER — Ambulatory Visit (HOSPITAL_COMMUNITY)
Admission: RE | Admit: 2023-02-02 | Discharge: 2023-02-02 | Disposition: A | Discharge status: Home / Self Care | Payer: Medicare Other | Source: Ambulatory Visit | Attending: Internal Medicine | Admitting: Internal Medicine

## 2023-02-02 DIAGNOSIS — I110 Hypertensive heart disease with heart failure: Secondary | ICD-10-CM | POA: Insufficient documentation

## 2023-02-02 DIAGNOSIS — I3481 Nonrheumatic mitral (valve) annulus calcification: Secondary | ICD-10-CM | POA: Diagnosis not present

## 2023-02-02 DIAGNOSIS — E785 Hyperlipidemia, unspecified: Secondary | ICD-10-CM | POA: Insufficient documentation

## 2023-02-02 DIAGNOSIS — I5032 Chronic diastolic (congestive) heart failure: Secondary | ICD-10-CM

## 2023-02-02 DIAGNOSIS — J449 Chronic obstructive pulmonary disease, unspecified: Secondary | ICD-10-CM | POA: Diagnosis not present

## 2023-02-02 DIAGNOSIS — I2510 Atherosclerotic heart disease of native coronary artery without angina pectoris: Secondary | ICD-10-CM | POA: Insufficient documentation

## 2023-02-02 DIAGNOSIS — E119 Type 2 diabetes mellitus without complications: Secondary | ICD-10-CM | POA: Insufficient documentation

## 2023-02-02 NOTE — Progress Notes (Signed)
Echocardiogram 2D Echocardiogram has been performed.  Anthony [name] 02/02/2023, 11:02 AM

## 2023-02-03 LAB — ECHOCARDIOGRAM COMPLETE
Area-P 1/2: 4.39 cm2
Calc EF: 55.9 %
S' Lateral: 2.5 cm
Single Plane A2C EF: 54.2 %
Single Plane A4C EF: 58.6 %

## 2023-03-01 ENCOUNTER — Other Ambulatory Visit: Payer: Self-pay

## 2023-03-01 DIAGNOSIS — I739 Peripheral vascular disease, unspecified: Secondary | ICD-10-CM

## 2023-03-01 DIAGNOSIS — I70213 Atherosclerosis of native arteries of extremities with intermittent claudication, bilateral legs: Secondary | ICD-10-CM

## 2023-03-01 DIAGNOSIS — Z8249 Family history of ischemic heart disease and other diseases of the circulatory system: Secondary | ICD-10-CM

## 2023-03-22 ENCOUNTER — Ambulatory Visit (INDEPENDENT_AMBULATORY_CARE_PROVIDER_SITE_OTHER)
Admission: RE | Admit: 2023-03-22 | Discharge: 2023-03-22 | Disposition: A | Discharge status: Home / Self Care | Payer: Medicare Other | Source: Ambulatory Visit | Attending: Vascular Surgery | Admitting: Vascular Surgery

## 2023-03-22 ENCOUNTER — Ambulatory Visit (HOSPITAL_COMMUNITY)
Admission: RE | Admit: 2023-03-22 | Discharge: 2023-03-22 | Disposition: A | Discharge status: Home / Self Care | Payer: Medicare Other | Source: Ambulatory Visit | Attending: Vascular Surgery | Admitting: Vascular Surgery

## 2023-03-22 DIAGNOSIS — I2510 Atherosclerotic heart disease of native coronary artery without angina pectoris: Secondary | ICD-10-CM | POA: Insufficient documentation

## 2023-03-22 DIAGNOSIS — I70213 Atherosclerosis of native arteries of extremities with intermittent claudication, bilateral legs: Secondary | ICD-10-CM | POA: Diagnosis not present

## 2023-03-22 DIAGNOSIS — I10 Essential (primary) hypertension: Secondary | ICD-10-CM | POA: Insufficient documentation

## 2023-03-22 DIAGNOSIS — I739 Peripheral vascular disease, unspecified: Secondary | ICD-10-CM

## 2023-03-22 DIAGNOSIS — J449 Chronic obstructive pulmonary disease, unspecified: Secondary | ICD-10-CM | POA: Insufficient documentation

## 2023-03-22 DIAGNOSIS — E785 Hyperlipidemia, unspecified: Secondary | ICD-10-CM | POA: Diagnosis not present

## 2023-03-22 DIAGNOSIS — E1151 Type 2 diabetes mellitus with diabetic peripheral angiopathy without gangrene: Secondary | ICD-10-CM | POA: Insufficient documentation

## 2023-03-22 DIAGNOSIS — Z87891 Personal history of nicotine dependence: Secondary | ICD-10-CM | POA: Insufficient documentation

## 2023-03-22 DIAGNOSIS — Z8249 Family history of ischemic heart disease and other diseases of the circulatory system: Secondary | ICD-10-CM | POA: Diagnosis not present

## 2023-03-22 LAB — VAS US ABI WITH/WO TBI
Left ABI: 1.27
Right ABI: 0.68

## 2023-03-28 ENCOUNTER — Ambulatory Visit (INDEPENDENT_AMBULATORY_CARE_PROVIDER_SITE_OTHER): Payer: Medicare Other | Admitting: Physician Assistant

## 2023-03-28 VITALS — BP 130/75 | HR 60 | Temp 98.0°F | Ht 68.0 in | Wt 226.0 lb

## 2023-03-28 DIAGNOSIS — I70213 Atherosclerosis of native arteries of extremities with intermittent claudication, bilateral legs: Secondary | ICD-10-CM

## 2023-03-28 DIAGNOSIS — I739 Peripheral vascular disease, unspecified: Secondary | ICD-10-CM

## 2023-03-28 DIAGNOSIS — I6521 Occlusion and stenosis of right carotid artery: Secondary | ICD-10-CM

## 2023-03-28 NOTE — Progress Notes (Signed)
Office Note   History of Present Illness   Anthony [name] is a 77 y.o. (05-10-1946) male who presents for surveillance of PAD and carotid artery stenosis.  He has a history of left iliofemoral endarterectomy with bovine patch angioplasty x 2 and lithotripsy and stenting of left iliac artery on 11/05/2020.  This was done for severe claudication with an occluded left femoral artery.  He also has a history of right external iliac, common femoral, superficial femoral, and profundofemoral endarterectomy with bovine pericardial patch angioplasty on 10/10/2014 by Dr. [name].  This was done for lifestyle limiting claudication.  He has a history of right carotid artery stenosis 40 to 59% and left carotid artery stenosis 1 to 39%.  He has no history of stroke or TIA.  He returns today for follow-up.  He states that his legs still sometimes get tired if he walks for long period of time, but this is not lifestyle limiting.  He denies any rest pain or lower extremity wounds.  He also denies any strokelike symptoms such as slurred speech, facial droop, sudden changes to vision, or sudden weakness/numbness.  He is still taking aspirin and statin.  Current Outpatient Medications  Medication Sig Dispense Refill   Albuterol Sulfate, sensor, (PROAIR DIGIHALER) 108 (90 Base) MCG/ACT AEPB Inhale into the lungs as needed.     ASPIRIN LOW DOSE 81 MG EC tablet Take 81 mg by mouth every evening.      bisoprolol (ZEBETA) 5 MG tablet Take 1 tablet by mouth at bedtime.     CINNAMON PO Take 1,000 mg by mouth every evening.      doxazosin (CARDURA) 2 MG tablet Take 2 mg by mouth every evening.     doxycycline (ADOXA) 50 MG tablet Take 50 mg by mouth daily.     empagliflozin (JARDIANCE) 25 MG TABS tablet Take 12.5 mg by mouth daily.     fish oil-omega-3 fatty acids 1000 MG capsule Take 2 g by mouth daily.     fluticasone (FLONASE) 50 MCG/ACT nasal spray Place 2 sprays into both nostrils at bedtime as needed for  allergies or rhinitis.      furosemide (LASIX) 20 MG tablet Take 20 mg by mouth daily at 12 noon.      GARLIC PO Take [id number] mg by mouth daily.      ibuprofen (ADVIL) 200 MG tablet Take 400 mg by mouth every 6 (six) hours as needed for headache or moderate pain.      isosorbide mononitrate (IMDUR) 30 MG 24 hr tablet TAKE 1 TABLET BY MOUTH  DAILY 90 tablet 3   L-Lysine 1000 MG TABS Take 1,000 mg by mouth daily.     losartan (COZAAR) 100 MG tablet Take 100 mg by mouth daily.     metFORMIN (GLUCOPHAGE) 1000 MG tablet Take 1,000 mg by mouth 2 (two) times daily with a meal.     Multiple Vitamin (MULTIVITAMIN) tablet Take 1 tablet by mouth daily.     nitroGLYCERIN (NITROSTAT) 0.4 MG SL tablet Place 1 tablet (0.4 mg total) under the tongue every 5 (five) minutes as needed for chest pain. 90 tablet 3   pentoxifylline (TRENTAL) 400 MG CR tablet TAKE 1 TABLET BY MOUTH  TWICE DAILY 180 tablet 3   rOPINIRole (REQUIP) 1 MG tablet Take 1 mg by mouth every evening.      simvastatin (ZOCOR) 40 MG tablet Take 40 mg by mouth every evening.     SUPER B COMPLEX/C PO Take  1 tablet by mouth daily.     umeclidinium-vilanterol (ANORO ELLIPTA) 62.5-25 MCG/INH AEPB INHALE 1 INHALATION BY  MOUTH INTO THE LUNGS DAILY 180 each 3   BENFOTIAMINE PO Take 250 mg by mouth 2 (two) times daily.     Potassium Chloride ER 20 MEQ TBCR Take 20 mEq by mouth daily.     Semaglutide (OZEMPIC, 1 MG/DOSE, [place]) Inject 2 mg into the skin once a week.     No current facility-administered medications for this visit.    REVIEW OF SYSTEMS (negative unless checked):   Cardiac:  []  Chest pain or chest pressure? []  Shortness of breath upon activity? []  Shortness of breath when lying flat? []  Irregular heart rhythm?  Vascular:  []  Pain in calf, thigh, or hip brought on by walking? []  Pain in feet at night that wakes you up from your sleep? []  Blood clot in your veins? []  Leg swelling?  Pulmonary:  []  Oxygen at home? []  Productive  cough? []  Wheezing?  Neurologic:  []  Sudden weakness in arms or legs? []  Sudden numbness in arms or legs? []  Sudden onset of difficult speaking or slurred speech? []  Temporary loss of vision in one eye? []  Problems with dizziness?  Gastrointestinal:  []  Blood in stool? []  Vomited blood?  Genitourinary:  []  Burning when urinating? []  Blood in urine?  Psychiatric:  []  Major depression  Hematologic:  []  Bleeding problems? []  Problems with blood clotting?  Dermatologic:  []  Rashes or ulcers?  Constitutional:  []  Fever or chills?  Ear/Nose/Throat:  []  Change in hearing? []  Nose bleeds? []  Sore throat?  Musculoskeletal:  [x]  Back pain? []  Joint pain? []  Muscle pain?   Physical Examination   Vitals:   03/28/23 0924 03/28/23 0925  BP: 134/70 130/75  Pulse: 60   Temp: 98 F (36.7 C)   TempSrc: Temporal   SpO2: 94%   Weight: 226 lb (102.5 kg)   Height: 5\' 8"  (1.727 m)    Body mass index is 34.36 kg/m.  General:  WDWN in NAD; vital signs documented above Gait: Not observed HENT: WNL, normocephalic Pulmonary: normal non-labored breathing Cardiac: regular, carotid bruit on the right Abdomen: soft, NT, no masses Skin: without rashes Vascular Exam/Pulses: Nonpalpable pedal pulses.  Brisk DP/PT Doppler signals bilaterally Extremities: without ischemic changes, without Gangrene , without cellulitis; without open wounds;  Musculoskeletal: no muscle wasting or atrophy  Neurologic: A&O X 3;  No focal weakness or paresthesias are detected Psychiatric:  The pt has Normal affect.  Non-Invasive Vascular imaging   ABI (03/22/2023) R:  ABI: 0.68 (0.73),  PT: mono DP: mono TBI:  0.44 L:  ABI: 1.27 (1.05),  PT: bi DP: bi TBI: 0.70  Aortoiliac Duplex (03/22/2023) Patent left CIA/EIA stents without significant stenosis. Slightly elevated velocities of proximal stent with PSV 220.  Carotid Duplex (03/22/2023) Right Carotid Findings:   +----------+--------+--------+--------+----------------------+--------+           PSV cm/sEDV cm/sStenosisPlaque Description    Comments  +----------+--------+--------+--------+----------------------+--------+  CCA Prox  68      19                                              +----------+--------+--------+--------+----------------------+--------+  CCA Mid   58      18                                              +----------+--------+--------+--------+----------------------+--------+  CCA Distal49      20              heterogenous                    +----------+--------+--------+--------+----------------------+--------+  ICA Prox  217     77      60-79%  calcific and irregular          +----------+--------+--------+--------+----------------------+--------+  ICA Distal113     43                                              +----------+--------+--------+--------+----------------------+--------+  ECA      233     18      >50%                                    +----------+--------+--------+--------+----------------------+--------+   +----------+--------+-------+--------+-------------------+           PSV cm/sEDV cmsDescribeArm Pressure (mmHG)  +----------+--------+-------+--------+-------------------+  Subclavian120    17             168                  +----------+--------+-------+--------+-------------------+   +---------+--------+--+--------+--+---------+  VertebralPSV cm/s36EDV cm/s12Antegrade  +---------+--------+--+--------+--+---------+      Left Carotid Findings:  +----------+--------+--------+--------+------------------+--------+           PSV cm/sEDV cm/sStenosisPlaque DescriptionComments  +----------+--------+--------+--------+------------------+--------+  CCA Prox  141     24                                          +----------+--------+--------+--------+------------------+--------+  CCA  Mid   93      21              heterogenous                +----------+--------+--------+--------+------------------+--------+  CCA Distal83      21              heterogenous                +----------+--------+--------+--------+------------------+--------+  ICA Prox  74      27      1-39%   diffuse                     +----------+--------+--------+--------+------------------+--------+  ICA Mid   132     38                                          +----------+--------+--------+--------+------------------+--------+  ICA Distal69      28                                          +----------+--------+--------+--------+------------------+--------+  ECA      123     18              heterogenous                +----------+--------+--------+--------+------------------+--------+   +----------+--------+--------+----------------+-------------------+  PSV cm/sEDV cm/sDescribe        Arm Pressure (mmHG)  +----------+--------+--------+----------------+-------------------+  Subclavian99     13      Multiphasic, [id number]                  +----------+--------+--------+----------------+-------------------+   +---------+--------+--+--------+--+---------+  VertebralPSV cm/s45EDV cm/s16Antegrade  +---------+--------+--+--------+--+---------+    Medical Decision Making   Anthony [name] is a 77 y.o. male who presents for surveillance of PAD and carotid artery stenosis  Based on the patient's vascular studies, his right ICA stenosis has increased since his last visit and now measures 60 to 79%.  The proximal ICA has a PSV of 217 and EDV of 77.  His left ICA stenosis is unchanged at 1 to 39%. His ABIs are essentially unchanged with biphasic flow on the left and monophasic flow on the right.  His left common and external iliac artery stents are patent with proximal stenosis greater than 50%. This is new based off previous studies, however it is  not significant stenosis. He still has occasional claudication in both legs with prolonged walking but this is unchanged.  He denies any rest pain or lower extremity wounds.  He also denies any strokelike symptoms. Given that his right ICA stenosis has seemingly increased to greater than 60% and there is some new stenosis in the left iliac stents, we will schedule a 3-month follow-up with the patient.  At that time he will repeat ABIs, carotid duplex, and aortoiliac duplex Continue aspirin and statin   Vicente Serene PA-C [facility] Office: [phone]  Call MD: Carlis Abbott

## 2023-03-29 ENCOUNTER — Other Ambulatory Visit: Payer: Self-pay

## 2023-03-29 DIAGNOSIS — I70213 Atherosclerosis of native arteries of extremities with intermittent claudication, bilateral legs: Secondary | ICD-10-CM

## 2023-03-29 DIAGNOSIS — I6521 Occlusion and stenosis of right carotid artery: Secondary | ICD-10-CM

## 2023-03-29 DIAGNOSIS — I739 Peripheral vascular disease, unspecified: Secondary | ICD-10-CM

## 2023-09-27 ENCOUNTER — Encounter (HOSPITAL_COMMUNITY): Payer: Medicare Other

## 2023-09-27 ENCOUNTER — Ambulatory Visit: Payer: Medicare Other

## 2023-10-03 ENCOUNTER — Ambulatory Visit (INDEPENDENT_AMBULATORY_CARE_PROVIDER_SITE_OTHER): Payer: Medicare Other | Admitting: Physician Assistant

## 2023-10-03 ENCOUNTER — Ambulatory Visit (INDEPENDENT_AMBULATORY_CARE_PROVIDER_SITE_OTHER)
Admission: RE | Admit: 2023-10-03 | Discharge: 2023-10-03 | Disposition: A | Discharge status: Home / Self Care | Payer: Medicare Other | Source: Ambulatory Visit | Attending: Surgery | Admitting: Surgery

## 2023-10-03 ENCOUNTER — Ambulatory Visit (HOSPITAL_COMMUNITY)
Admission: RE | Admit: 2023-10-03 | Discharge: 2023-10-03 | Disposition: A | Discharge status: Home / Self Care | Payer: Medicare Other | Source: Ambulatory Visit | Attending: Surgery | Admitting: Surgery

## 2023-10-03 VITALS — BP 105/63 | HR 66 | Temp 98.0°F | Resp 20 | Ht 68.0 in | Wt 227.8 lb

## 2023-10-03 DIAGNOSIS — I6521 Occlusion and stenosis of right carotid artery: Secondary | ICD-10-CM

## 2023-10-03 DIAGNOSIS — I739 Peripheral vascular disease, unspecified: Secondary | ICD-10-CM

## 2023-10-03 DIAGNOSIS — I70213 Atherosclerosis of native arteries of extremities with intermittent claudication, bilateral legs: Secondary | ICD-10-CM

## 2023-10-03 LAB — VAS US ABI WITH/WO TBI
Left ABI: 1.11
Right ABI: 0.64

## 2023-10-03 NOTE — Progress Notes (Signed)
Office Note     CC:  follow up Requesting Provider:  Creola Corn, MD  HPI: Anthony [name] is a 77 y.o. (November 19, 1946) male who presents for surveillance of PAD and carotid artery stenosis.  He has history of left iliofemoral endarterectomy with bovine patch angioplasty and stenting of the left iliac artery on 11/05/2020 by Dr. Myra Gianotti.  This was performed due to severe claudication with an occluded left common femoral artery.  Surgical history also significant for right iliofemoral endarterectomy with bovine patch angioplasty on 10/10/2014 due to lifestyle limiting claudication.  He has a known right SFA occlusion.  He denies any claudication symptoms currently.  He is also without rest pain or tissue loss.  He admittedly is limited by his breathing related to COPD.  He is also followed for carotid artery stenosis.  He denies any diagnosis of CVA or TIA since last office visit.  He also is without strokelike symptoms including slurring speech, changes in vision, or one-sided weakness.  He is on a daily aspirin and statin.  He is a former smoker.   Past Medical History:  Diagnosis Date   Atherosclerosis of native artery of both lower extremities with intermittent claudication (HCC)    followed by vascular , dr Myra Gianotti;  s/p angioplasty and stenting bilaterally 2015 and 11/ 2021 (per lov note 06-08-2021 bilateral iliofemoral intervention's are widely patent)   CAD (coronary artery disease)    cardiologist-- dr Gala Romney;  s/p stent placed 06/02/2000 in [place];  s/p cath with patent LCx , PCI w/ DES to pLAD and PCI to POBA of D1   Chronic diastolic (congestive) heart failure (HCC)    followed by cardiology   COPD (chronic obstructive pulmonary disease) with chronic bronchitis [facility])    pulmonology-- dr Craige Cotta--  (09-23-2021  per pt checks O2 stats at home, [place] 96-97% on RA)   Edema of left lower extremity    per pt since vasuclar surgery 11/ 2021, but told mild   History of COVID-19 06/2021    per pt mild symtpoms that resolved   Hyperlipidemia    Hypertension    followed by cardiology and pcp   OA (osteoarthritis)    OSA on CPAP    followed by dr Craige Cotta--  study in epic 08-23-2012 severe osa (no additional oxygen)   PAD (peripheral artery disease) (HCC)    Peripheral neuropathy    feet   Peripheral vascular disease (HCC)    Rosacea    S/P drug eluting coronary stent placement    2001  x1 stent (unsure if DES);  05/ 2020 DES to pLAD   Shortness of breath    with exertion with stairs but recovers quickly,  ok w/ household chores   Type 2 diabetes mellitus (HCC)    followed by pcp   (09-23-2021 per pt check blood sugar at home twice weekly in am,  fasting sugar-- 170)    Past Surgical History:  Procedure Laterality Date   ABDOMINAL AORTAGRAM N/A 11/29/2012   Procedure: ABDOMINAL Ronny Flurry;  Surgeon: Nada Libman, MD;  Location: [facility] CATH LAB;  Service: Cardiovascular;  Laterality: N/A;   ABDOMINAL AORTAGRAM N/A 09/03/2014   Procedure: ABDOMINAL Ronny Flurry;  Surgeon: Nada Libman, MD;  Location: [facility] CATH LAB;  Service: Cardiovascular;  Laterality: N/A;   ABDOMINAL AORTOGRAM W/LOWER EXTREMITY N/A 09/16/2020   Procedure: ABDOMINAL AORTOGRAM W/LOWER EXTREMITY;  Surgeon: Nada Libman, MD;  Location: [facility] INVASIVE CV LAB;  Service: Cardiovascular;  Laterality: N/A;   ANTERIOR CERVICAL  DECOMP/DISCECTOMY FUSION  10/2001   C5--C7   ANTERIOR LATERAL LUMBAR FUSION 4 LEVELS Right 05/28/2014   Procedure: Right L4-5 L3-4 L2-3, L1-2  Anterior lateral lumbar fusion with percutaneaous pedicle screws. Lumbar four/five, three/four, two/three and possible two/one;  Surgeon: Maeola Harman, MD;  Location: [facility] NEURO ORS;  Service: Neurosurgery;  Laterality: Right;  Lumbar One-Five Fusion with Percutaneous Screws   BUNIONECTOMY Right    1991 and 1994   CARDIAC CATHETERIZATION  03/26/1999   in [place];  per pt medically managed , no intervention   CARPAL TUNNEL RELEASE Right 09/30/2006    CATARACT EXTRACTION W/ INTRAOCULAR LENS IMPLANT Bilateral 2014   COLONOSCOPY  08/26/2020   CORONARY ANGIOPLASTY WITH STENT PLACEMENT  06/02/2000   in [place]; x1 stent   CORONARY STENT INTERVENTION N/A 05/11/2019   Procedure: CORONARY STENT INTERVENTION;  Surgeon: Kathleene Hazel, MD;  Location: [facility] INVASIVE CV LAB;  Service: Cardiovascular;  Laterality: N/A;   ENDARTERECTOMY FEMORAL Right 10/10/2014   Procedure: RIGHT FEMORAL ARTERY ENDARTERECTOMY  WITH VASCU GUARD PATCH ANGIOPLASTY;  Surgeon: Nada Libman, MD;  Location: [facility] OR;  Service: Vascular;  Laterality: Right;   ENDARTERECTOMY FEMORAL Left 11/05/2020   Procedure: LEFT FEMORAL ENDARTERECTOMY WITH PATCH ANGIOPLASTY;  Surgeon: Nada Libman, MD;  Location: [facility] OR;  Service: Vascular;  Laterality: Left;   FEMORAL ENDARTERECTOMY Left 11/05/2020   LEFT FEMORAL ENDARTERECTOMY WITH PATCH ANGIOPLASTY (Left    FINGER ARTHRODESIS Right 09/2009   right thumb   HAMMER TOE SURGERY Left    06/ 2007 and 08/ 2008   HYDRADENITIS EXCISION N/A 09/25/2021   Procedure: EXCISION HIDRADENITIS, INTERROGATION OF PERINEAL WOUND;  Surgeon: Andria Meuse, MD;  Location: [facility];  Service: General;  Laterality: N/A;   INSERTION OF ILIAC STENT Left 11/05/2020    INSERTION OF ILIAC STENT (Left )   INSERTION OF ILIAC STENT Left 11/05/2020   Procedure: INSERTION OF ILIAC STENT;  Surgeon: Nada Libman, MD;  Location: [facility] OR;  Service: Vascular;  Laterality: Left;   LUMBAR PERCUTANEOUS PEDICLE SCREW 4 LEVEL N/A 05/28/2014   Procedure: LUMBAR PERCUTANEOUS PEDICLE SCREW 4 LEVEL;  Surgeon: Maeola Harman, MD;  Location: [facility]UR[facility];  Service: Neurosurgery;  Laterality: N/A;   LUMBAR SPINE SURGERY     12/ 1997 and 04/ 2005   NEUROMA SURGERY Right 1996   right foot   PERIPHERAL INTRAVASCULAR LITHOTRIPSY Left 11/05/2020   Procedure: INTRAVASCULAR LITHOTRIPSY;  Surgeon: Nada Libman, MD;  Location: [facility] OR;  Service: Vascular;   Laterality: Left;  shockwave   PERIPHERAL VASCULAR CATHETERIZATION N/A 12/07/2016   Procedure: Abdominal Aortogram w/Lower Extremity;  Surgeon: Nada Libman, MD;  [facility]: [facility] LAB;  Service: Cardiovascular;  Laterality: N/A;   RECTAL EXAM UNDER ANESTHESIA N/A 09/25/2021   Procedure: ANORECTAL EXAM UNDER ANESTHESIA;  Surgeon: Andria Meuse, MD;  Location: [facility];  Service: General;  Laterality: N/A;   RIGHT/LEFT HEART CATH AND CORONARY ANGIOGRAPHY N/A 05/11/2019   Procedure: RIGHT/LEFT HEART CATH AND CORONARY ANGIOGRAPHY;  Surgeon: Dolores Patty, MD;  Location: [facility] INVASIVE CV LAB;  Service: Cardiovascular;  Laterality: N/A;   TONSILLECTOMY  1965    Social History   Socioeconomic History   Marital status: Married    Spouse name: Not on file   Number of children: Not on file   Years of education: Not on file   Highest education level: Not on file  Occupational History   Occupation: retired  Tobacco  Use   Smoking status: Former    Current packs/day: 0.00    Average packs/day: 2.0 packs/day for 28.0 years (56.0 ttl pk-yrs)    Types: Cigarettes    Start date: 08/03/1959    Quit date: 08/03/1987    Years since quitting: 36.1   Smokeless tobacco: Never  Vaping Use   Vaping status: Never Used  Substance and Sexual Activity   Alcohol use: Yes    Comment: weekly   Drug use: No   Sexual activity: Yes  Other Topics Concern   Not on file  Social History Narrative   Not on file   Social Determinants of Health   Financial Resource Strain: Not on file  Food Insecurity: Not on file  Transportation Needs: Not on file  Physical Activity: Not on file  Stress: Not on file  Social Connections: Not on file  Intimate Partner Violence: Not on file    Family History  Problem Relation Age of Onset   Heart disease Father 68   Cancer Father    Hyperlipidemia Father    Hypertension Father    Heart attack Father    Cancer Mother    Deep vein  thrombosis Mother        Varicose veins   Diabetes Mother    Hyperlipidemia Mother    Hypertension Mother     Current Outpatient Medications  Medication Sig Dispense Refill   Albuterol Sulfate, sensor, (PROAIR DIGIHALER) 108 (90 Base) MCG/ACT AEPB Inhale into the lungs as needed.     ASPIRIN LOW DOSE 81 MG EC tablet Take 81 mg by mouth every evening.      BENFOTIAMINE PO Take 250 mg by mouth 2 (two) times daily.     bisoprolol (ZEBETA) 5 MG tablet Take 1 tablet by mouth at bedtime.     CINNAMON PO Take 1,000 mg by mouth every evening.      doxazosin (CARDURA) 2 MG tablet Take 2 mg by mouth every evening.     doxycycline (ADOXA) 50 MG tablet Take 50 mg by mouth daily.     empagliflozin (JARDIANCE) 25 MG TABS tablet Take 12.5 mg by mouth daily.     fish oil-omega-3 fatty acids 1000 MG capsule Take 2 g by mouth daily.     fluticasone (FLONASE) 50 MCG/ACT nasal spray Place 2 sprays into both nostrils at bedtime as needed for allergies or rhinitis.      furosemide (LASIX) 20 MG tablet Take 20 mg by mouth daily at 12 noon.      GARLIC PO Take 1,914 mg by mouth daily.      ibuprofen (ADVIL) 200 MG tablet Take 400 mg by mouth every 6 (six) hours as needed for headache or moderate pain.      isosorbide mononitrate (IMDUR) 30 MG 24 hr tablet TAKE 1 TABLET BY MOUTH  DAILY 90 tablet 3   L-Lysine 1000 MG TABS Take 1,000 mg by mouth daily.     losartan (COZAAR) 100 MG tablet Take 100 mg by mouth daily.     metFORMIN (GLUCOPHAGE) 1000 MG tablet Take 1,000 mg by mouth 2 (two) times daily with a meal.     Multiple Vitamin (MULTIVITAMIN) tablet Take 1 tablet by mouth daily.     nitroGLYCERIN (NITROSTAT) 0.4 MG SL tablet Place 1 tablet (0.4 mg total) under the tongue every 5 (five) minutes as needed for chest pain. 90 tablet 3   pentoxifylline (TRENTAL) 400 MG CR tablet TAKE 1 TABLET BY MOUTH  TWICE  DAILY 180 tablet 3   rOPINIRole (REQUIP) 1 MG tablet Take 1 mg by mouth every evening.      Semaglutide  (OZEMPIC, 1 MG/DOSE, [place]) Inject 2 mg into the skin once a week.     simvastatin (ZOCOR) 40 MG tablet Take 40 mg by mouth every evening.     SUPER B COMPLEX/C PO Take 1 tablet by mouth daily.     umeclidinium-vilanterol (ANORO ELLIPTA) 62.5-25 MCG/INH AEPB INHALE 1 INHALATION BY  MOUTH INTO THE LUNGS DAILY 180 each 3   No current facility-administered medications for this visit.    Allergies  Allergen Reactions   Spiriva Respimat [Tiotropium Bromide Monohydrate]     Other reaction(s): Throat irritation     REVIEW OF SYSTEMS:   [X]  denotes positive finding, [ ]  denotes negative finding Cardiac  Comments:  Chest pain or chest pressure:    Shortness of breath upon exertion:    Short of breath when lying flat:    Irregular heart rhythm:        Vascular    Pain in calf, thigh, or hip brought on by ambulation:    Pain in feet at night that wakes you up from your sleep:     Blood clot in your veins:    Leg swelling:         Pulmonary    Oxygen at home:    Productive cough:     Wheezing:         Neurologic    Sudden weakness in arms or legs:     Sudden numbness in arms or legs:     Sudden onset of difficulty speaking or slurred speech:    Temporary loss of vision in one eye:     Problems with dizziness:         Gastrointestinal    Blood in stool:     Vomited blood:         Genitourinary    Burning when urinating:     Blood in urine:        Psychiatric    Major depression:         Hematologic    Bleeding problems:    Problems with blood clotting too easily:        Skin    Rashes or ulcers:        Constitutional    Fever or chills:      PHYSICAL EXAMINATION:  Vitals:   10/03/23 0906  BP: 105/63  Pulse: 66  Resp: 20  Temp: 98 F (36.7 C)  TempSrc: Temporal  SpO2: 93%  Weight: 227 lb 12.8 oz (103.3 kg)  Height: 5\' 8"  (1.727 m)    General:  WDWN in NAD; vital signs documented above Gait: Not observed HENT: WNL, normocephalic Pulmonary: normal  non-labored breathing , without Rales, rhonchi,  wheezing Cardiac: regular HR Abdomen: soft, NT, no masses Skin: without rashes Vascular Exam/Pulses: No palpable pedal pulses right foot; palpable left ATA Extremities: without ischemic changes, without Gangrene , without cellulitis; without open wounds;  Musculoskeletal: no muscle wasting or atrophy  Neurologic: A&O X 3 Psychiatric:  The pt has Normal affect.   Non-Invasive Vascular Imaging:   Widely patent left iliac stent  ABI/TBIToday's ABIToday's TBIPrevious ABIPrevious TBI  +-------+-----------+-----------+------------+------------+  Right 0.64       0.39       0.68        0.44          +-------+-----------+-----------+------------+------------+  Left  1.11  0.72       1.27        0.70           Right ICA 60 to 79% stenosis Left ICA 1 to 39% stenosis  ASSESSMENT/PLAN:: 77 y.o. male here for follow up for surveillance of PAD as well as carotid artery stenosis  Bilateral lower extremities well-perfused on exam.  Duplex demonstrates a widely patent left iliac artery stent.  He has a known right SFA occlusion however denies any significant claudication.  He is also without rest pain and tissue loss of bilateral lower extremities.  He admittedly is limited by his COPD.  No indication for further workup or intervention of right SFA chronic total occlusion.  We will repeat his aortoiliac duplex and ABI in 1 year.  He is also followed for carotid artery stenosis.  Duplex is stable demonstrating 60 to 70% stenosis of the right ICA and 1 to 39% stenosis of the left ICA.  No indication currently for revascularization of the right ICA asymptomatic stenosis.  We will repeat carotid duplex in 6 months.  He will continue his aspirin and statin daily.   Emilie Rutter, PA-C [facility] [phone]  Clinic MD:   Myra Gianotti

## 2023-10-04 ENCOUNTER — Observation Stay (HOSPITAL_BASED_OUTPATIENT_CLINIC_OR_DEPARTMENT_OTHER)
Admission: EM | Admit: 2023-10-04 | Discharge: 2023-10-06 | Disposition: A | Discharge status: Home / Self Care | Payer: Medicare Other | Attending: Family Medicine | Admitting: Family Medicine

## 2023-10-04 ENCOUNTER — Emergency Department (HOSPITAL_BASED_OUTPATIENT_CLINIC_OR_DEPARTMENT_OTHER): Payer: Medicare Other

## 2023-10-04 ENCOUNTER — Encounter (HOSPITAL_BASED_OUTPATIENT_CLINIC_OR_DEPARTMENT_OTHER): Payer: Self-pay | Admitting: Emergency Medicine

## 2023-10-04 ENCOUNTER — Observation Stay (HOSPITAL_COMMUNITY): Payer: Medicare Other

## 2023-10-04 ENCOUNTER — Other Ambulatory Visit: Payer: Self-pay

## 2023-10-04 DIAGNOSIS — Z7984 Long term (current) use of oral hypoglycemic drugs: Secondary | ICD-10-CM | POA: Diagnosis not present

## 2023-10-04 DIAGNOSIS — I639 Cerebral infarction, unspecified: Secondary | ICD-10-CM | POA: Diagnosis not present

## 2023-10-04 DIAGNOSIS — I110 Hypertensive heart disease with heart failure: Secondary | ICD-10-CM | POA: Insufficient documentation

## 2023-10-04 DIAGNOSIS — J449 Chronic obstructive pulmonary disease, unspecified: Secondary | ICD-10-CM | POA: Diagnosis not present

## 2023-10-04 DIAGNOSIS — I739 Peripheral vascular disease, unspecified: Secondary | ICD-10-CM | POA: Diagnosis present

## 2023-10-04 DIAGNOSIS — Z7982 Long term (current) use of aspirin: Secondary | ICD-10-CM | POA: Diagnosis not present

## 2023-10-04 DIAGNOSIS — Z955 Presence of coronary angioplasty implant and graft: Secondary | ICD-10-CM | POA: Diagnosis not present

## 2023-10-04 DIAGNOSIS — R2990 Unspecified symptoms and signs involving the nervous system: Secondary | ICD-10-CM | POA: Diagnosis not present

## 2023-10-04 DIAGNOSIS — R4781 Slurred speech: Secondary | ICD-10-CM | POA: Diagnosis present

## 2023-10-04 DIAGNOSIS — I2510 Atherosclerotic heart disease of native coronary artery without angina pectoris: Secondary | ICD-10-CM | POA: Insufficient documentation

## 2023-10-04 DIAGNOSIS — Z79899 Other long term (current) drug therapy: Secondary | ICD-10-CM | POA: Insufficient documentation

## 2023-10-04 DIAGNOSIS — G459 Transient cerebral ischemic attack, unspecified: Principal | ICD-10-CM

## 2023-10-04 DIAGNOSIS — I509 Heart failure, unspecified: Secondary | ICD-10-CM | POA: Insufficient documentation

## 2023-10-04 DIAGNOSIS — Z8616 Personal history of COVID-19: Secondary | ICD-10-CM | POA: Insufficient documentation

## 2023-10-04 DIAGNOSIS — E119 Type 2 diabetes mellitus without complications: Secondary | ICD-10-CM

## 2023-10-04 DIAGNOSIS — I10 Essential (primary) hypertension: Secondary | ICD-10-CM | POA: Diagnosis present

## 2023-10-04 DIAGNOSIS — G4733 Obstructive sleep apnea (adult) (pediatric): Secondary | ICD-10-CM | POA: Diagnosis present

## 2023-10-04 DIAGNOSIS — Z87891 Personal history of nicotine dependence: Secondary | ICD-10-CM | POA: Insufficient documentation

## 2023-10-04 LAB — CREATININE, SERUM
Creatinine, Ser: 1.42 mg/dL — ABNORMAL HIGH (ref 0.61–1.24)
GFR, Estimated: 50 mL/min — ABNORMAL LOW (ref >60)

## 2023-10-04 LAB — RAPID URINE DRUG SCREEN, HOSP PERFORMED
Amphetamines: NOT DETECTED
Barbiturates: NOT DETECTED
Benzodiazepines: NOT DETECTED
Cocaine: NOT DETECTED
Opiates: NOT DETECTED
Tetrahydrocannabinol: POSITIVE — AB

## 2023-10-04 LAB — CBC
HCT: 43.1 % (ref 39.0–52.0)
HCT: 43.6 % (ref 39.0–52.0)
Hemoglobin: 14.1 g/dL (ref 13.0–17.0)
Hemoglobin: 14.2 g/dL (ref 13.0–17.0)
MCH: 30.2 pg (ref 26.0–34.0)
MCH: 29.6 pg (ref 26.0–34.0)
MCHC: 32.7 g/dL (ref 30.0–36.0)
MCHC: 32.6 g/dL (ref 30.0–36.0)
MCV: 92.3 fL (ref 80.0–100.0)
MCV: 91 fL (ref 80.0–100.0)
Platelets: 141 10*3/uL — ABNORMAL LOW (ref 150–400)
Platelets: 128 10*3/uL — ABNORMAL LOW (ref 150–400)
RBC: 4.67 MIL/uL (ref 4.22–5.81)
RBC: 4.79 MIL/uL (ref 4.22–5.81)
RDW: 13.5 % (ref 11.5–15.5)
RDW: 13.2 % (ref 11.5–15.5)
WBC: 5.8 10*3/uL (ref 4.0–10.5)
WBC: 5.4 10*3/uL (ref 4.0–10.5)
nRBC: 0 % (ref 0.0–0.2)
nRBC: 0 % (ref 0.0–0.2)

## 2023-10-04 LAB — DIFFERENTIAL
Abs Immature Granulocytes: 0.01 10*3/uL (ref 0.00–0.07)
Basophils Absolute: 0 10*3/uL (ref 0.0–0.1)
Basophils Relative: 1 %
Eosinophils Absolute: 0.2 10*3/uL (ref 0.0–0.5)
Eosinophils Relative: 3 %
Immature Granulocytes: 0 %
Lymphocytes Relative: 30 %
Lymphs Abs: 1.7 10*3/uL (ref 0.7–4.0)
Monocytes Absolute: 0.6 10*3/uL (ref 0.1–1.0)
Monocytes Relative: 10 %
Neutro Abs: 3.3 10*3/uL (ref 1.7–7.7)
Neutrophils Relative %: 56 %

## 2023-10-04 LAB — COMPREHENSIVE METABOLIC PANEL
ALT: 15 U/L (ref 0–44)
AST: 20 U/L (ref 15–41)
Albumin: 4.2 g/dL (ref 3.5–5.0)
Alkaline Phosphatase: 55 U/L (ref 38–126)
Anion gap: 8 (ref 5–15)
BUN: 35 mg/dL — ABNORMAL HIGH (ref 8–23)
CO2: 25 mmol/L (ref 22–32)
Calcium: 9.4 mg/dL (ref 8.9–10.3)
Chloride: 105 mmol/L (ref 98–111)
Creatinine, Ser: 1.68 mg/dL — ABNORMAL HIGH (ref 0.61–1.24)
GFR, Estimated: 41 mL/min — ABNORMAL LOW (ref >60)
Glucose, Bld: 114 mg/dL — ABNORMAL HIGH (ref 70–99)
Potassium: 5 mmol/L (ref 3.5–5.1)
Sodium: 138 mmol/L (ref 135–145)
Total Bilirubin: 0.7 mg/dL (ref 0.3–1.2)
Total Protein: 6.9 g/dL (ref 6.5–8.1)

## 2023-10-04 LAB — PROTIME-INR
INR: 1 (ref 0.8–1.2)
Prothrombin Time: 13.7 s (ref 11.4–15.2)

## 2023-10-04 LAB — URINALYSIS, ROUTINE W REFLEX MICROSCOPIC
Bacteria, UA: NONE SEEN
Bilirubin Urine: NEGATIVE
Glucose, UA: >1000 mg/dL — AB
Hgb urine dipstick: NEGATIVE
Ketones, ur: NEGATIVE mg/dL
Leukocytes,Ua: NEGATIVE
Nitrite: NEGATIVE
Protein, ur: NEGATIVE mg/dL
Specific Gravity, Urine: 1.024 (ref 1.005–1.030)
pH: 5.5 (ref 5.0–8.0)

## 2023-10-04 LAB — GLUCOSE, CAPILLARY: Glucose-Capillary: 93 mg/dL (ref 70–99)

## 2023-10-04 LAB — ETHANOL: Alcohol, Ethyl (B): <10 mg/dL (ref <10)

## 2023-10-04 LAB — HEMOGLOBIN A1C
Hgb A1c MFr Bld: 7.7 % — ABNORMAL HIGH (ref 4.8–5.6)
Mean Plasma Glucose: 174.29 mg/dL

## 2023-10-04 LAB — CBG MONITORING, ED: Glucose-Capillary: 106 mg/dL — ABNORMAL HIGH (ref 70–99)

## 2023-10-04 LAB — APTT: aPTT: 29 s (ref 24–36)

## 2023-10-04 MED ORDER — SENNOSIDES-DOCUSATE SODIUM 8.6-50 MG PO TABS: 1.0000 | ORAL_TABLET | Freq: Every evening | ORAL | Status: DC | PRN

## 2023-10-04 MED ORDER — SIMVASTATIN 20 MG PO TABS
40.0000 mg | ORAL_TABLET | Freq: Every evening | ORAL | Status: DC
  Administered 2023-10-05: 40 mg via ORAL
  Filled 2023-10-04: qty 2

## 2023-10-04 MED ORDER — ACETAMINOPHEN 650 MG RE SUPP: 650.0000 mg | RECTAL | Status: DC | PRN

## 2023-10-04 MED ORDER — ASPIRIN 81 MG PO TBEC
81.0000 mg | DELAYED_RELEASE_TABLET | Freq: Every evening | ORAL | Status: DC
  Administered 2023-10-05: 81 mg via ORAL
  Filled 2023-10-04: qty 1

## 2023-10-04 MED ORDER — INSULIN ASPART 100 UNIT/ML IJ SOLN: 0.0000 [IU] | Freq: Every day | INTRAMUSCULAR | Status: DC

## 2023-10-04 MED ORDER — IOHEXOL 350 MG/ML SOLN
100.0000 mL | Freq: Once | INTRAVENOUS | Status: AC | PRN
  Administered 2023-10-04: 100 mL via INTRAVENOUS

## 2023-10-04 MED ORDER — INSULIN ASPART 100 UNIT/ML IJ SOLN
0.0000 [IU] | Freq: Three times a day (TID) | INTRAMUSCULAR | Status: DC
  Administered 2023-10-05 – 2023-10-06 (×2): 3 [IU] via SUBCUTANEOUS

## 2023-10-04 MED ORDER — ASPIRIN 81 MG PO CHEW
324.0000 mg | CHEWABLE_TABLET | Freq: Once | ORAL | Status: AC
  Administered 2023-10-04: 324 mg via ORAL
  Filled 2023-10-04: qty 4

## 2023-10-04 MED ORDER — ENOXAPARIN SODIUM 40 MG/0.4ML IJ SOSY
40.0000 mg | PREFILLED_SYRINGE | INTRAMUSCULAR | Status: DC
  Administered 2023-10-05 (×2): 40 mg via SUBCUTANEOUS
  Filled 2023-10-04 (×2): qty 0.4

## 2023-10-04 MED ORDER — ACETAMINOPHEN 160 MG/5ML PO SOLN: 650.0000 mg | ORAL | Status: DC | PRN

## 2023-10-04 MED ORDER — BISOPROLOL FUMARATE 5 MG PO TABS
5.0000 mg | ORAL_TABLET | Freq: Every day | ORAL | Status: DC
  Administered 2023-10-05 (×2): 5 mg via ORAL
  Filled 2023-10-04 (×2): qty 1

## 2023-10-04 MED ORDER — STROKE: EARLY STAGES OF RECOVERY BOOK
Freq: Once | Status: AC
  Filled 2023-10-04: qty 1

## 2023-10-04 MED ORDER — CLOPIDOGREL BISULFATE 300 MG PO TABS
300.0000 mg | ORAL_TABLET | Freq: Once | ORAL | Status: AC
  Administered 2023-10-04: 300 mg via ORAL
  Filled 2023-10-04: qty 1

## 2023-10-04 MED ORDER — ACETAMINOPHEN 325 MG PO TABS: 650.0000 mg | ORAL_TABLET | ORAL | Status: DC | PRN

## 2023-10-04 NOTE — ED Triage Notes (Signed)
Pt bib wife, reports that pt was "fumbling with cards ~ 30 mins pta. Pts wife reports slurred speech today. AOx4, bilaterally equal, denies weakness, denies HA. Pt wifes states pt dizzy

## 2023-10-04 NOTE — ED Notes (Signed)
Pt transported to Ct with this RN

## 2023-10-04 NOTE — ED Notes (Signed)
Teleneuro physcian told patient and family recommends inpatient for MRI.

## 2023-10-04 NOTE — ED Provider Notes (Signed)
PSV cm/sEDV cmsDescribeArm Pressure (mmHG) +----------+--------+-------+--------+-------------------+ [id number]                    137                 +----------+--------+-------+--------+-------------------+ +---------+--------+--+--------+-+---------+ VertebralPSV cm/s23EDV cm/s8Antegrade +---------+--------+--+--------+-+---------+  Left Carotid Findings: +----------+--------+--------+--------+------------------+--------+           PSV cm/sEDV cm/sStenosisPlaque DescriptionComments +----------+--------+--------+--------+------------------+--------+ CCA Prox  120     18              heterogenous               +----------+--------+--------+--------+------------------+--------+ CCA Distal87      15              heterogenous               +----------+--------+--------+--------+------------------+--------+ ICA Prox  46      13      1-39%   heterogenous               +----------+--------+--------+--------+------------------+--------+ ICA Mid   60      23                                         +----------+--------+--------+--------+------------------+--------+ ICA Distal45      17                                          +----------+--------+--------+--------+------------------+--------+ ECA       116                                                +----------+--------+--------+--------+------------------+--------+ +----------+--------+--------+--------+-------------------+           PSV cm/sEDV cm/sDescribeArm Pressure (mmHG) +----------+--------+--------+--------+-------------------+ [id number]                      126                 +----------+--------+--------+--------+-------------------+ +---------+--------+--+--------+-+---------+ VertebralPSV cm/s26EDV cm/s9Antegrade +---------+--------+--+--------+-+---------+   Summary: Right Carotid: Velocities in the right ICA are consistent with a 60-79%                stenosis. Left Carotid: Velocities in the left ICA are consistent with a 1-39% stenosis. Vertebrals: Bilateral vertebral arteries demonstrate antegrade flow. *See table(s) above for measurements and observations.  Electronically signed by Coral Else MD on 10/03/2023 at 3:41:45 PM.    Final    VAS [place] ABI WITH/WO TBI  Result Date: 10/03/2023  LOWER EXTREMITY DOPPLER STUDY Patient Name:  Anthony [name]  Date of Exam:   10/03/2023 Medical Rec #: [id number]        Accession #:    [id number] Date of Birth: 30-Nov-1946        Patient Gender: M Patient Age:   77 years Exam Location:  [facility] Procedure:      VAS [place] ABI WITH/WO TBI Referring Phys: Coral Else --------------------------------------------------------------------------------  Indications: Peripheral artery disease. High Risk Factors: Hypertension, hyperlipidemia, coronary artery disease. Other Factors: Patient reports no lower.  Vascular Interventions: 11/05/2020  following components:      Result Value   Platelets 141 (*)    All other components within normal limits  COMPREHENSIVE METABOLIC PANEL - Abnormal; Notable for the following components:   Glucose, Bld 114 (*)    BUN 35 (*)    Creatinine, Ser 1.68 (*)    GFR, Estimated 41 (*)    All other components within normal limits  CBG MONITORING, ED - Abnormal; Notable for the following components:   Glucose-Capillary 106 (*)    All other components within normal limits  ETHANOL  PROTIME-INR  APTT  DIFFERENTIAL  RAPID URINE DRUG SCREEN, HOSP PERFORMED  URINALYSIS, ROUTINE W REFLEX MICROSCOPIC    EKG EKG Interpretation Date/Time:  Tuesday October 04 2023 16:23:04 EDT Ventricular Rate:  72 PR Interval:  218 QRS Duration:  95 QT Interval:  405 QTC Calculation: 444 R Axis:   1  Text Interpretation: Sinus rhythm Borderline prolonged PR interval Anteroseptal infarct, age indeterminate Since last tracing of earlier today No significant change since last tracing Confirmed by Elayne Snare ([id number]) on 10/04/2023 4:26:31 PM  Radiology CT HEAD CODE STROKE WO CONTRAST`  Result Date: 10/04/2023 CLINICAL DATA:  Code stroke. Neuro deficit, acute, stroke suspected. Slurred speech. Thought disturbance. EXAM: CT HEAD WITHOUT CONTRAST TECHNIQUE: Contiguous axial images were obtained from the base of the skull through the vertex without intravenous contrast. RADIATION DOSE REDUCTION: This exam was performed according to the departmental dose-optimization program which includes automated exposure control, adjustment of the mA and/or kV according to patient size and/or use of iterative reconstruction technique. COMPARISON:   None Available. FINDINGS: Brain: Mild age related volume loss without subjective lobar predominance. No sign of old or acute infarction, mass lesion, hemorrhage, hydrocephalus or extra-axial collection. Vascular: There is atherosclerotic calcification of the major vessels at the base of the brain. No convincing acute hyperdense vessel. Skull: Normal Sinuses/Orbits: Clear/normal Other: None ASPECTS (Alberta Stroke Program Early CT Score) - Ganglionic level infarction (caudate, lentiform nuclei, internal capsule, insula, M1-M3 cortex): 7 - Supraganglionic infarction (M4-M6 cortex): 3 Total score (0-10 with 10 being normal): 10 IMPRESSION: 1. No acute CT finding. Mild age related volume loss. 2. Aspects is 10. These results were called by telephone at the time of interpretation on 10/04/2023 at 3:37 pm to provider [facility] , who verbally acknowledged these results. Electronically Signed   By: Paulina Fusi M.D.   On: 10/04/2023 15:38   VAS US AORTA/IVC/ILIACS  Result Date: 10/03/2023 ABDOMINAL AORTA STUDY Patient Name:  Anthony [name]  Date of Exam:   10/03/2023 Medical Rec #: [id number]        Accession #:    [id number] Date of Birth: 27-Jan-1946        Patient Gender: M Patient Age:   77 years Exam Location:  [facility] Procedure:      VAS US AORTA/IVC/ILIACS Referring Phys: Coral Else --------------------------------------------------------------------------------  Indications: Left CIA / EIA stent evaluation Risk Factors: Hypertension, hyperlipidemia, coronary artery disease. Vascular Interventions: 11/05/2020                         Extensive endarterectomy of the left external iliac,                         common femoral, superficial femoral, and profundofemoral  following components:      Result Value   Platelets 141 (*)    All other components within normal limits  COMPREHENSIVE METABOLIC PANEL - Abnormal; Notable for the following components:   Glucose, Bld 114 (*)    BUN 35 (*)    Creatinine, Ser 1.68 (*)    GFR, Estimated 41 (*)    All other components within normal limits  CBG MONITORING, ED - Abnormal; Notable for the following components:   Glucose-Capillary 106 (*)    All other components within normal limits  ETHANOL  PROTIME-INR  APTT  DIFFERENTIAL  RAPID URINE DRUG SCREEN, HOSP PERFORMED  URINALYSIS, ROUTINE W REFLEX MICROSCOPIC    EKG EKG Interpretation Date/Time:  Tuesday October 04 2023 16:23:04 EDT Ventricular Rate:  72 PR Interval:  218 QRS Duration:  95 QT Interval:  405 QTC Calculation: 444 R Axis:   1  Text Interpretation: Sinus rhythm Borderline prolonged PR interval Anteroseptal infarct, age indeterminate Since last tracing of earlier today No significant change since last tracing Confirmed by Elayne Snare ([id number]) on 10/04/2023 4:26:31 PM  Radiology CT HEAD CODE STROKE WO CONTRAST`  Result Date: 10/04/2023 CLINICAL DATA:  Code stroke. Neuro deficit, acute, stroke suspected. Slurred speech. Thought disturbance. EXAM: CT HEAD WITHOUT CONTRAST TECHNIQUE: Contiguous axial images were obtained from the base of the skull through the vertex without intravenous contrast. RADIATION DOSE REDUCTION: This exam was performed according to the departmental dose-optimization program which includes automated exposure control, adjustment of the mA and/or kV according to patient size and/or use of iterative reconstruction technique. COMPARISON:   None Available. FINDINGS: Brain: Mild age related volume loss without subjective lobar predominance. No sign of old or acute infarction, mass lesion, hemorrhage, hydrocephalus or extra-axial collection. Vascular: There is atherosclerotic calcification of the major vessels at the base of the brain. No convincing acute hyperdense vessel. Skull: Normal Sinuses/Orbits: Clear/normal Other: None ASPECTS (Alberta Stroke Program Early CT Score) - Ganglionic level infarction (caudate, lentiform nuclei, internal capsule, insula, M1-M3 cortex): 7 - Supraganglionic infarction (M4-M6 cortex): 3 Total score (0-10 with 10 being normal): 10 IMPRESSION: 1. No acute CT finding. Mild age related volume loss. 2. Aspects is 10. These results were called by telephone at the time of interpretation on 10/04/2023 at 3:37 pm to provider [facility] , who verbally acknowledged these results. Electronically Signed   By: Paulina Fusi M.D.   On: 10/04/2023 15:38   VAS US AORTA/IVC/ILIACS  Result Date: 10/03/2023 ABDOMINAL AORTA STUDY Patient Name:  Anthony [name]  Date of Exam:   10/03/2023 Medical Rec #: [id number]        Accession #:    [id number] Date of Birth: 27-Jan-1946        Patient Gender: M Patient Age:   77 years Exam Location:  [facility] Procedure:      VAS US AORTA/IVC/ILIACS Referring Phys: Coral Else --------------------------------------------------------------------------------  Indications: Left CIA / EIA stent evaluation Risk Factors: Hypertension, hyperlipidemia, coronary artery disease. Vascular Interventions: 11/05/2020                         Extensive endarterectomy of the left external iliac,                         common femoral, superficial femoral, and profundofemoral  following components:      Result Value   Platelets 141 (*)    All other components within normal limits  COMPREHENSIVE METABOLIC PANEL - Abnormal; Notable for the following components:   Glucose, Bld 114 (*)    BUN 35 (*)    Creatinine, Ser 1.68 (*)    GFR, Estimated 41 (*)    All other components within normal limits  CBG MONITORING, ED - Abnormal; Notable for the following components:   Glucose-Capillary 106 (*)    All other components within normal limits  ETHANOL  PROTIME-INR  APTT  DIFFERENTIAL  RAPID URINE DRUG SCREEN, HOSP PERFORMED  URINALYSIS, ROUTINE W REFLEX MICROSCOPIC    EKG EKG Interpretation Date/Time:  Tuesday October 04 2023 16:23:04 EDT Ventricular Rate:  72 PR Interval:  218 QRS Duration:  95 QT Interval:  405 QTC Calculation: 444 R Axis:   1  Text Interpretation: Sinus rhythm Borderline prolonged PR interval Anteroseptal infarct, age indeterminate Since last tracing of earlier today No significant change since last tracing Confirmed by Elayne Snare ([id number]) on 10/04/2023 4:26:31 PM  Radiology CT HEAD CODE STROKE WO CONTRAST`  Result Date: 10/04/2023 CLINICAL DATA:  Code stroke. Neuro deficit, acute, stroke suspected. Slurred speech. Thought disturbance. EXAM: CT HEAD WITHOUT CONTRAST TECHNIQUE: Contiguous axial images were obtained from the base of the skull through the vertex without intravenous contrast. RADIATION DOSE REDUCTION: This exam was performed according to the departmental dose-optimization program which includes automated exposure control, adjustment of the mA and/or kV according to patient size and/or use of iterative reconstruction technique. COMPARISON:   None Available. FINDINGS: Brain: Mild age related volume loss without subjective lobar predominance. No sign of old or acute infarction, mass lesion, hemorrhage, hydrocephalus or extra-axial collection. Vascular: There is atherosclerotic calcification of the major vessels at the base of the brain. No convincing acute hyperdense vessel. Skull: Normal Sinuses/Orbits: Clear/normal Other: None ASPECTS (Alberta Stroke Program Early CT Score) - Ganglionic level infarction (caudate, lentiform nuclei, internal capsule, insula, M1-M3 cortex): 7 - Supraganglionic infarction (M4-M6 cortex): 3 Total score (0-10 with 10 being normal): 10 IMPRESSION: 1. No acute CT finding. Mild age related volume loss. 2. Aspects is 10. These results were called by telephone at the time of interpretation on 10/04/2023 at 3:37 pm to provider [facility] , who verbally acknowledged these results. Electronically Signed   By: Paulina Fusi M.D.   On: 10/04/2023 15:38   VAS US AORTA/IVC/ILIACS  Result Date: 10/03/2023 ABDOMINAL AORTA STUDY Patient Name:  Anthony [name]  Date of Exam:   10/03/2023 Medical Rec #: [id number]        Accession #:    [id number] Date of Birth: 27-Jan-1946        Patient Gender: M Patient Age:   77 years Exam Location:  [facility] Procedure:      VAS US AORTA/IVC/ILIACS Referring Phys: Coral Else --------------------------------------------------------------------------------  Indications: Left CIA / EIA stent evaluation Risk Factors: Hypertension, hyperlipidemia, coronary artery disease. Vascular Interventions: 11/05/2020                         Extensive endarterectomy of the left external iliac,                         common femoral, superficial femoral, and profundofemoral  following components:      Result Value   Platelets 141 (*)    All other components within normal limits  COMPREHENSIVE METABOLIC PANEL - Abnormal; Notable for the following components:   Glucose, Bld 114 (*)    BUN 35 (*)    Creatinine, Ser 1.68 (*)    GFR, Estimated 41 (*)    All other components within normal limits  CBG MONITORING, ED - Abnormal; Notable for the following components:   Glucose-Capillary 106 (*)    All other components within normal limits  ETHANOL  PROTIME-INR  APTT  DIFFERENTIAL  RAPID URINE DRUG SCREEN, HOSP PERFORMED  URINALYSIS, ROUTINE W REFLEX MICROSCOPIC    EKG EKG Interpretation Date/Time:  Tuesday October 04 2023 16:23:04 EDT Ventricular Rate:  72 PR Interval:  218 QRS Duration:  95 QT Interval:  405 QTC Calculation: 444 R Axis:   1  Text Interpretation: Sinus rhythm Borderline prolonged PR interval Anteroseptal infarct, age indeterminate Since last tracing of earlier today No significant change since last tracing Confirmed by Elayne Snare ([id number]) on 10/04/2023 4:26:31 PM  Radiology CT HEAD CODE STROKE WO CONTRAST`  Result Date: 10/04/2023 CLINICAL DATA:  Code stroke. Neuro deficit, acute, stroke suspected. Slurred speech. Thought disturbance. EXAM: CT HEAD WITHOUT CONTRAST TECHNIQUE: Contiguous axial images were obtained from the base of the skull through the vertex without intravenous contrast. RADIATION DOSE REDUCTION: This exam was performed according to the departmental dose-optimization program which includes automated exposure control, adjustment of the mA and/or kV according to patient size and/or use of iterative reconstruction technique. COMPARISON:   None Available. FINDINGS: Brain: Mild age related volume loss without subjective lobar predominance. No sign of old or acute infarction, mass lesion, hemorrhage, hydrocephalus or extra-axial collection. Vascular: There is atherosclerotic calcification of the major vessels at the base of the brain. No convincing acute hyperdense vessel. Skull: Normal Sinuses/Orbits: Clear/normal Other: None ASPECTS (Alberta Stroke Program Early CT Score) - Ganglionic level infarction (caudate, lentiform nuclei, internal capsule, insula, M1-M3 cortex): 7 - Supraganglionic infarction (M4-M6 cortex): 3 Total score (0-10 with 10 being normal): 10 IMPRESSION: 1. No acute CT finding. Mild age related volume loss. 2. Aspects is 10. These results were called by telephone at the time of interpretation on 10/04/2023 at 3:37 pm to provider [facility] , who verbally acknowledged these results. Electronically Signed   By: Paulina Fusi M.D.   On: 10/04/2023 15:38   VAS US AORTA/IVC/ILIACS  Result Date: 10/03/2023 ABDOMINAL AORTA STUDY Patient Name:  Anthony [name]  Date of Exam:   10/03/2023 Medical Rec #: [id number]        Accession #:    [id number] Date of Birth: 27-Jan-1946        Patient Gender: M Patient Age:   77 years Exam Location:  [facility] Procedure:      VAS US AORTA/IVC/ILIACS Referring Phys: Coral Else --------------------------------------------------------------------------------  Indications: Left CIA / EIA stent evaluation Risk Factors: Hypertension, hyperlipidemia, coronary artery disease. Vascular Interventions: 11/05/2020                         Extensive endarterectomy of the left external iliac,                         common femoral, superficial femoral, and profundofemoral  Distal Stent   185             biphasic         +---------------+--------+--------+--------+--------+ Distal to Stent197             biphasic         +---------------+--------+--------+--------+--------+ Unable to  replicate increased velocties from prior exam.   Summary: Stenosis: +------------------+-------------+ Location          Stenosis      +------------------+-------------+ Right Common Iliac>50% stenosis +------------------+-------------+ Patent left CIA / EIA stent without obvious evidence of stenosis.  *See table(s) above for measurements and observations.  Electronically signed by Coral Else MD on 10/03/2023 at 3:42:10 PM.    Final    VAS US CAROTID  Result Date: 10/03/2023 Carotid Arterial Duplex Study Patient Name:  Anthony [name]  Date of Exam:   10/03/2023 Medical Rec #: [id number]        Accession #:    [id number] Date of Birth: May 04, 1946        Patient Gender: M Patient Age:   77 years Exam Location:  [facility] Procedure:      VAS US CAROTID Referring Phys: Coral Else --------------------------------------------------------------------------------  Indications:       Carotid artery disease. Risk Factors:      Hypertension, hyperlipidemia, coronary artery disease. Comparison Study:  03/22/23 prior Performing Technologist: [place] [employer]  Examination Guidelines: A complete evaluation includes B-mode imaging, spectral Doppler, color Doppler, and power Doppler as needed of all accessible portions of each vessel. Bilateral testing is considered an integral part of a complete examination. Limited examinations for reoccurring indications may be performed as noted.  Right Carotid Findings: +----------+--------+--------+--------+-------------------------+--------+           PSV cm/sEDV cm/sStenosisPlaque Description       Comments +----------+--------+--------+--------+-------------------------+--------+ CCA Prox  51      17              heterogenous                      +----------+--------+--------+--------+-------------------------+--------+ CCA Distal40      17              heterogenous                       +----------+--------+--------+--------+-------------------------+--------+ ICA Prox  175     69      60-79%  heterogenous and calcific         +----------+--------+--------+--------+-------------------------+--------+ ICA Mid   72      25                                                +----------+--------+--------+--------+-------------------------+--------+ ICA Distal84      28                                                +----------+--------+--------+--------+-------------------------+--------+ ECA       183                                                       +----------+--------+--------+--------+-------------------------+--------+ +----------+--------+-------+--------+-------------------+  following components:      Result Value   Platelets 141 (*)    All other components within normal limits  COMPREHENSIVE METABOLIC PANEL - Abnormal; Notable for the following components:   Glucose, Bld 114 (*)    BUN 35 (*)    Creatinine, Ser 1.68 (*)    GFR, Estimated 41 (*)    All other components within normal limits  CBG MONITORING, ED - Abnormal; Notable for the following components:   Glucose-Capillary 106 (*)    All other components within normal limits  ETHANOL  PROTIME-INR  APTT  DIFFERENTIAL  RAPID URINE DRUG SCREEN, HOSP PERFORMED  URINALYSIS, ROUTINE W REFLEX MICROSCOPIC    EKG EKG Interpretation Date/Time:  Tuesday October 04 2023 16:23:04 EDT Ventricular Rate:  72 PR Interval:  218 QRS Duration:  95 QT Interval:  405 QTC Calculation: 444 R Axis:   1  Text Interpretation: Sinus rhythm Borderline prolonged PR interval Anteroseptal infarct, age indeterminate Since last tracing of earlier today No significant change since last tracing Confirmed by Elayne Snare ([id number]) on 10/04/2023 4:26:31 PM  Radiology CT HEAD CODE STROKE WO CONTRAST`  Result Date: 10/04/2023 CLINICAL DATA:  Code stroke. Neuro deficit, acute, stroke suspected. Slurred speech. Thought disturbance. EXAM: CT HEAD WITHOUT CONTRAST TECHNIQUE: Contiguous axial images were obtained from the base of the skull through the vertex without intravenous contrast. RADIATION DOSE REDUCTION: This exam was performed according to the departmental dose-optimization program which includes automated exposure control, adjustment of the mA and/or kV according to patient size and/or use of iterative reconstruction technique. COMPARISON:   None Available. FINDINGS: Brain: Mild age related volume loss without subjective lobar predominance. No sign of old or acute infarction, mass lesion, hemorrhage, hydrocephalus or extra-axial collection. Vascular: There is atherosclerotic calcification of the major vessels at the base of the brain. No convincing acute hyperdense vessel. Skull: Normal Sinuses/Orbits: Clear/normal Other: None ASPECTS (Alber[place] Stroke Program Early CT Score) - Ganglionic level infarction (caudate, lentiform nuclei, internal capsule, insula, M1-M3 cortex): 7 - Supraganglionic infarction (M4-M6 cortex): 3 Total score (0-10 with 10 being normal): 10 IMPRESSION: 1. No acute CT finding. Mild age related volume loss. 2. Aspects is 10. These results were called by telephone at the time of interpretation on 10/04/2023 at 3:37 pm to provider [facility] , who verbally acknowledged these results. Electronically Signed   By: Paulina Fusi M.D.   On: 10/04/2023 15:38   VAS US AORTA/IVC/ILIACS  Result Date: 10/03/2023 ABDOMINAL AORTA STUDY Patient Name:  Anthony [name]  Date of Exam:   10/03/2023 Medical Rec #: [id number]        Accession #:    [id number] Date of Birth: 27-Jan-1946        Patient Gender: M Patient Age:   77 years Exam Location:  [facility] Procedure:      VAS US AORTA/IVC/ILIACS Referring Phys: Coral Else --------------------------------------------------------------------------------  Indications: Left CIA / EIA stent evaluation Risk Factors: Hypertension, hyperlipidemia, coronary artery disease. Vascular Interventions: 11/05/2020                         Extensive endarterectomy of the left external iliac,                         common femoral, superficial femoral, and profundofemoral  following components:      Result Value   Platelets 141 (*)    All other components within normal limits  COMPREHENSIVE METABOLIC PANEL - Abnormal; Notable for the following components:   Glucose, Bld 114 (*)    BUN 35 (*)    Creatinine, Ser 1.68 (*)    GFR, Estimated 41 (*)    All other components within normal limits  CBG MONITORING, ED - Abnormal; Notable for the following components:   Glucose-Capillary 106 (*)    All other components within normal limits  ETHANOL  PROTIME-INR  APTT  DIFFERENTIAL  RAPID URINE DRUG SCREEN, HOSP PERFORMED  URINALYSIS, ROUTINE W REFLEX MICROSCOPIC    EKG EKG Interpretation Date/Time:  Tuesday October 04 2023 16:23:04 EDT Ventricular Rate:  72 PR Interval:  218 QRS Duration:  95 QT Interval:  405 QTC Calculation: 444 R Axis:   1  Text Interpretation: Sinus rhythm Borderline prolonged PR interval Anteroseptal infarct, age indeterminate Since last tracing of earlier today No significant change since last tracing Confirmed by Elayne Snare ([id number]) on 10/04/2023 4:26:31 PM  Radiology CT HEAD CODE STROKE WO CONTRAST`  Result Date: 10/04/2023 CLINICAL DATA:  Code stroke. Neuro deficit, acute, stroke suspected. Slurred speech. Thought disturbance. EXAM: CT HEAD WITHOUT CONTRAST TECHNIQUE: Contiguous axial images were obtained from the base of the skull through the vertex without intravenous contrast. RADIATION DOSE REDUCTION: This exam was performed according to the departmental dose-optimization program which includes automated exposure control, adjustment of the mA and/or kV according to patient size and/or use of iterative reconstruction technique. COMPARISON:   None Available. FINDINGS: Brain: Mild age related volume loss without subjective lobar predominance. No sign of old or acute infarction, mass lesion, hemorrhage, hydrocephalus or extra-axial collection. Vascular: There is atherosclerotic calcification of the major vessels at the base of the brain. No convincing acute hyperdense vessel. Skull: Normal Sinuses/Orbits: Clear/normal Other: None ASPECTS (Alberta Stroke Program Early CT Score) - Ganglionic level infarction (caudate, lentiform nuclei, internal capsule, insula, M1-M3 cortex): 7 - Supraganglionic infarction (M4-M6 cortex): 3 Total score (0-10 with 10 being normal): 10 IMPRESSION: 1. No acute CT finding. Mild age related volume loss. 2. Aspects is 10. These results were called by telephone at the time of interpretation on 10/04/2023 at 3:37 pm to provider [facility] , who verbally acknowledged these results. Electronically Signed   By: Paulina Fusi M.D.   On: 10/04/2023 15:38   VAS US AORTA/IVC/ILIACS  Result Date: 10/03/2023 ABDOMINAL AORTA STUDY Patient Name:  Anthony [name]  Date of Exam:   10/03/2023 Medical Rec #: [id number]        Accession #:    [id number] Date of Birth: 27-Jan-1946        Patient Gender: M Patient Age:   77 years Exam Location:  [facility] Procedure:      VAS US AORTA/IVC/ILIACS Referring Phys: Coral Else --------------------------------------------------------------------------------  Indications: Left CIA / EIA stent evaluation Risk Factors: Hypertension, hyperlipidemia, coronary artery disease. Vascular Interventions: 11/05/2020                         Extensive endarterectomy of the left external iliac,                         common femoral, superficial femoral, and profundofemoral  Distal Stent   185             biphasic         +---------------+--------+--------+--------+--------+ Distal to Stent197             biphasic         +---------------+--------+--------+--------+--------+ Unable to  replicate increased velocties from prior exam.   Summary: Stenosis: +------------------+-------------+ Location          Stenosis      +------------------+-------------+ Right Common Iliac>50% stenosis +------------------+-------------+ Patent left CIA / EIA stent without obvious evidence of stenosis.  *See table(s) above for measurements and observations.  Electronically signed by Coral Else MD on 10/03/2023 at 3:42:10 PM.    Final    VAS US CAROTID  Result Date: 10/03/2023 Carotid Arterial Duplex Study Patient Name:  Anthony [name]  Date of Exam:   10/03/2023 Medical Rec #: [id number]        Accession #:    [id number] Date of Birth: May 04, 1946        Patient Gender: M Patient Age:   77 years Exam Location:  [facility] Procedure:      VAS US CAROTID Referring Phys: Coral Else --------------------------------------------------------------------------------  Indications:       Carotid artery disease. Risk Factors:      Hypertension, hyperlipidemia, coronary artery disease. Comparison Study:  03/22/23 prior Performing Technologist: [place] Ponder RVS  Examination Guidelines: A complete evaluation includes B-mode imaging, spectral Doppler, color Doppler, and power Doppler as needed of all accessible portions of each vessel. Bilateral testing is considered an integral part of a complete examination. Limited examinations for reoccurring indications may be performed as noted.  Right Carotid Findings: +----------+--------+--------+--------+-------------------------+--------+           PSV cm/sEDV cm/sStenosisPlaque Description       Comments +----------+--------+--------+--------+-------------------------+--------+ CCA Prox  51      17              heterogenous                      +----------+--------+--------+--------+-------------------------+--------+ CCA Distal40      17              heterogenous                       +----------+--------+--------+--------+-------------------------+--------+ ICA Prox  175     69      60-79%  heterogenous and calcific         +----------+--------+--------+--------+-------------------------+--------+ ICA Mid   72      25                                                +----------+--------+--------+--------+-------------------------+--------+ ICA Distal84      28                                                +----------+--------+--------+--------+-------------------------+--------+ ECA       183                                                       +----------+--------+--------+--------+-------------------------+--------+ +----------+--------+-------+--------+-------------------+  PSV cm/sEDV cmsDescribeArm Pressure (mmHG) +----------+--------+-------+--------+-------------------+ [id number]                    137                 +----------+--------+-------+--------+-------------------+ +---------+--------+--+--------+-+---------+ VertebralPSV cm/s23EDV cm/s8Antegrade +---------+--------+--+--------+-+---------+  Left Carotid Findings: +----------+--------+--------+--------+------------------+--------+           PSV cm/sEDV cm/sStenosisPlaque DescriptionComments +----------+--------+--------+--------+------------------+--------+ CCA Prox  120     18              heterogenous               +----------+--------+--------+--------+------------------+--------+ CCA Distal87      15              heterogenous               +----------+--------+--------+--------+------------------+--------+ ICA Prox  46      13      1-39%   heterogenous               +----------+--------+--------+--------+------------------+--------+ ICA Mid   60      23                                         +----------+--------+--------+--------+------------------+--------+ ICA Distal45      17                                          +----------+--------+--------+--------+------------------+--------+ ECA       116                                                +----------+--------+--------+--------+------------------+--------+ +----------+--------+--------+--------+-------------------+           PSV cm/sEDV cm/sDescribeArm Pressure (mmHG) +----------+--------+--------+--------+-------------------+ [id number]                      126                 +----------+--------+--------+--------+-------------------+ +---------+--------+--+--------+-+---------+ VertebralPSV cm/s26EDV cm/s9Antegrade +---------+--------+--+--------+-+---------+   Summary: Right Carotid: Velocities in the right ICA are consistent with a 60-79%                stenosis. Left Carotid: Velocities in the left ICA are consistent with a 1-39% stenosis. Vertebrals: Bilateral vertebral arteries demonstrate antegrade flow. *See table(s) above for measurements and observations.  Electronically signed by Coral Else MD on 10/03/2023 at 3:41:45 PM.    Final    VAS [place] ABI WITH/WO TBI  Result Date: 10/03/2023  LOWER EXTREMITY DOPPLER STUDY Patient Name:  Anthony [name]  Date of Exam:   10/03/2023 Medical Rec #: [id number]        Accession #:    [id number] Date of Birth: 30-Nov-1946        Patient Gender: M Patient Age:   77 years Exam Location:  [facility] Procedure:      VAS [place] ABI WITH/WO TBI Referring Phys: Coral Else --------------------------------------------------------------------------------  Indications: Peripheral artery disease. High Risk Factors: Hypertension, hyperlipidemia, coronary artery disease. Other Factors: Patient reports no lower.  Vascular Interventions: 11/05/2020

## 2023-10-04 NOTE — Consult Note (Signed)
[facility] Telemedicine Consult   Requesting Provider: Theresia Lo Consult Participants: Nursing Location of the provider: [facility] Location of the patient: [facility]  This consult was provided via telemedicine with 2-way video and audio communication. The patient/family was informed that care would be provided in this way and agreed to receive care in this manner.    Chief Complaint: Confusion  HPI: 77 year old male with a history of CAD, HTN, HLD, OSA, DM and PVD who was playing cards today and became acutely confused.  Speech was noted to be slurred and patient complained of dizziness.  Family was called and patient was brought to the ED for further evaluation.    LKW: 10/04/2023 @ 1345 TNK given?: No, Resolving symptoms IR Thrombectomy? No, Resolving symptoms.  No target lesion idenitfied Modified Rankin Scale: 0-Completely asymptomatic and back to baseline post- stroke Code stroke paged: 1511 Page received: 1515 Time of teleneurologist evaluation: 1517  Exam: Vitals:   10/04/23 1509  BP: 133/74  Pulse: 63  Resp: 16  Temp: 97.8 F (36.6 C)  SpO2: 94%     NIHSS components Score: Comment  1a Level of Conscious 0[x]  1[]  2[]  3[]      1b LOC Questions 0[x]  1[]  2[]       1c LOC Commands 0[x]  1[]  2[]       2 Best Gaze 0[x]  1[]  2[]       3 Visual 0[x]  1[]  2[]  3[]      4 Facial Palsy 0[]  1[x]  2[]  3[]      5a Motor Arm - left 0[x]  1[]  2[]  3[]  4[]  UN[]    5b Motor Arm - Right 0[x]  1[]  2[]  3[]  4[]  UN[]    6a Motor Leg - Left 0[x]  1[]  2[]  3[]  4[]  UN[]    6b Motor Leg - Right 0[x]  1[]  2[]  3[]  4[]  UN[]    7 Limb Ataxia 0[x]  1[]  2[]  3[]  UN[]     8 Sensory 0[x]  1[]  2[]  UN[]      9 Best Language 0[x]  1[]  2[]  3[]      10 Dysarthria 0[x]  1[]  2[]  UN[]      11 Extinct. and Inattention 0[x]  1[]  2[]       TOTAL: 1        Imaging Reviewed:  CT HEAD WITHOUT CONTRAST   TECHNIQUE: Contiguous axial images were obtained from the base of the skull through the vertex without intravenous  contrast.   RADIATION DOSE REDUCTION: This exam was performed according to the departmental dose-optimization program which includes automated exposure control, adjustment of the mA and/or kV according to patient size and/or use of iterative reconstruction technique.   COMPARISON:  None Available.   FINDINGS: Brain: Mild age related volume loss without subjective lobar predominance. No sign of old or acute infarction, mass lesion, hemorrhage, hydrocephalus or extra-axial collection.   Vascular: There is atherosclerotic calcification of the major vessels at the base of the brain. No convincing acute hyperdense vessel.   Skull: Normal   Sinuses/Orbits: Clear/normal   Other: None   ASPECTS (Alberta Stroke Program Early CT Score)   - Ganglionic level infarction (caudate, lentiform nuclei, internal capsule, insula, M1-M3 cortex): 7   - Supraganglionic infarction (M4-M6 cortex): 3   Total score (0-10 with 10 being normal): 10   IMPRESSION: 1. No acute CT finding. Mild age related volume loss. 2. Aspects is 10.  CT ANGIOGRAPHY HEAD AND NECK  CT PERFUSION BRAIN  TECHNIQUE: Multidetector CT imaging of the head and neck was performed using the standard protocol during bolus administration of intravenous contrast. Multiplanar CT image reconstructions  and MIPs were obtained to evaluate the vascular anatomy. Carotid stenosis measurements (when applicable) are obtained utilizing NASCET criteria, using the distal internal carotid diameter as the denominator.  Multiphase CT imaging of the brain was performed following IV bolus contrast injection. Subsequent parametric perfusion maps were calculated using RAPID software.  RADIATION DOSE REDUCTION: This exam was performed according to the departmental dose-optimization program which includes automated exposure control, adjustment of the mA and/or kV according to patient size and/or use of iterative reconstruction  technique.  CONTRAST: OMNIPAQUE IOHEXOL 350 MG/ML SOLN  COMPARISON: None Available.  FINDINGS: CTA:  Aortic arch: Aortic atherosclerosis. The visualized great vessel origins are patent.  Right carotid system: Atherosclerosis involving the carotid bifurcation. Proximal ICA with approximately 65% stenosis.  Left carotid system: Atherosclerosis at the carotid bifurcation without greater than 50% stenosis.  Vertebral arteries: Occlusion versus critical stenosis of the proximal right vertebral artery, not well evaluated due to streak artifact from adjacent spinal fusion. More distal right vertebral artery is opacified.  Skeleton: Multilevel corpectomy and anterior fusion in the cervical spine.  Other neck: No acute abnormality on limited assessment.  Upper chest: Emphysema.  Review of the MIP images confirms the above findings  CTA HEAD FINDINGS  Anterior circulation: Bilateral intracranial ICAs are patent with mild to moderate atherosclerotic narrowing due to predominately calcific atherosclerosis. Bilateral MCAs and ACAs are patent without proximal hemodynamically significant stenosis.  Posterior circulation: Mild-to-moderate atherosclerotic narrowing of the vertebral arteries bilaterally due to atherosclerosis. The basilar artery and bilateral posterior cerebral arteries are patent without proximal hemodynamically significant stenosis.  Venous sinuses: As permitted by contrast timing, patent.  CT Brain Perfusion Findings:  ASPECTS: 10  CBF (<30%) Volume: 0mL  Perfusion (Tmax>6.0s) volume: 5mL  Mismatch Volume: 5mL  IMPRESSION: CTA:  1. Occlusion versus critical stenosis of the proximal right vertebral artery, not well evaluated due to streak artifact from adjacent spinal fusion. More distal right vertebral artery is opacified. 2. Approximately 65% stenosis of the proximal right ICA in the neck. 3. Mild-to-moderate bilateral intracranial ICA and  intradural vertebral artery stenosis. 4. Aortic Atherosclerosis (ICD10-I70.0) and Emphysema (ICD10-J43.9).  CT perfusion:  Area of reported mismatch/penumbra in the superior left cerebellum and posterior left brainstem. It is unclear if this represents true tissue risk or artifact. An MRI could provide more sensitive evaluation for acute infarct if clinically warranted    Labs reviewed in epic and pertinent values follow: BS 106   Assessment: 77 year old male with a history of CAD, HTN, HLD, OSA, DM and PVD who was playing cards today and became acutely confused.  Speech was noted to be slurred and patient complained of dizziness.  Patient improved in ED.  Speech not slurred and confusion improved as well.  Confirmed by family.  Head CT reviewed and shows no acute changes.  CTA shows occlusion versus critical stenosis of the right vertebral.  CT perfusion abnormalities appear artifactual.  With improvement patient not a candidate for thrombolytics or thrombectomy.  Further work up recommended.    Recommendations:  1. Admission 2. HgbA1c, fasting lipid panel 3. MRI  of the brain without contrast 4. PT consult, OT consult, Speech consult 5. Echocardiogram 6. Prophylactic therapy-ASA 324mg  and Plavix 300mg  now with start of DAPT in AM at 81mg  ASA and 75mg  Plavix to be continued daily for 3 weeks 7. NPO until RN stroke swallow screen 8. Telemetry monitoring 9. Frequent neuro checks.  Patient remains within treatment window and if clinical worsening noted may  reconsider need to treat.     This patient is receiving care for possible acute neurological changes. There was 90 minutes of care by this provider at the time of service, including time for direct evaluation via telemedicine, review of medical records, imaging studies and discussion of findings with providers, the patient and/or family.  Thana Farr, MD Neurology   If 8pm- 8am, please page neurology on call as listed in AMION.

## 2023-10-04 NOTE — Progress Notes (Signed)
Neurologist back on Cart for follow up post imaging.

## 2023-10-04 NOTE — ED Notes (Signed)
Pt. Back from CT.

## 2023-10-04 NOTE — H&P (Signed)
History and Physical    Patient: Anthony [name] DOB: [birth year] DOA: 10/04/2023 DOS: the patient was seen and examined on 10/04/2023 PCP: Creola Corn, MD  Patient coming from: Home  Chief Complaint:  Chief Complaint  Patient presents with   Neurologic Problem    Slurred speech, not acting normal per family   HPI: Anthony [name] is a 77 y.o. male with medical history significant of coronary artery disease, peripheral vascular disease, COPD, essential hypertension, obstructive sleep apnea, osteoarthritis, history of cardiac stents who was apparently playing cards when he suddenly started having slurred speech, transient altered mental status, and had strokelike symptoms.  Patient was taken to the [facility] where he was seen and evaluated.  Initial head CT without contrast was negative.  Symptoms appear to have improved.  Urine drug screen is positive for THC alcohol level less than 10.  Patient appears to have had strokelike symptoms.  Neurology was consulted.  Recommendation was to admit for CVA workup.  Patient has received aspirin and is being admitted to the hospital for further evaluation and treatment.  Review of Systems: As mentioned in the history of present illness. All other systems reviewed and are negative. Past Medical History:  Diagnosis Date   Atherosclerosis of native artery of both lower extremities with intermittent claudication (HCC)    followed by vascular , dr Myra Gianotti;  s/p angioplasty and stenting bilaterally 2015 and 11/ 2021 (per lov note 06-08-2021 bilateral iliofemoral intervention's are widely patent)   CAD (coronary artery disease)    cardiologist-- dr Gala Romney;  s/p stent placed 06/02/2000 in [place];  s/p cath with patent LCx , PCI w/ DES to pLAD and PCI to POBA of D1   Chronic diastolic (congestive) heart failure (HCC)    followed by cardiology   COPD (chronic obstructive pulmonary disease) with chronic bronchitis [facility])    pulmonology-- dr  Craige Cotta--  (09-23-2021  per pt checks O2 stats at home, [place] 96-97% on RA)   Edema of left lower extremity    per pt since vasuclar surgery 11/ 2021, but told mild   History of COVID-19 06/2021   per pt mild symtpoms that resolved   Hyperlipidemia    Hypertension    followed by cardiology and pcp   OA (osteoarthritis)    OSA on CPAP    followed by dr Craige Cotta--  study in epic 08-23-2012 severe osa (no additional oxygen)   PAD (peripheral artery disease) (HCC)    Peripheral neuropathy    feet   Peripheral vascular disease (HCC)    Rosacea    S/P drug eluting coronary stent placement    2001  x1 stent (unsure if DES);  05/ 2020 DES to pLAD   Shortness of breath    with exertion with stairs but recovers quickly,  ok w/ household chores   Type 2 diabetes mellitus (HCC)    followed by pcp   (09-23-2021 per pt check blood sugar at home twice weekly in am,  fasting sugar-- 170)   Past Surgical History:  Procedure Laterality Date   ABDOMINAL AORTAGRAM N/A 11/29/2012   Procedure: ABDOMINAL Ronny Flurry;  Surgeon: Nada Libman, MD;  Location: [facility] CATH LAB;  Service: Cardiovascular;  Laterality: N/A;   ABDOMINAL AORTAGRAM N/A 09/03/2014   Procedure: ABDOMINAL Ronny Flurry;  Surgeon: Nada Libman, MD;  Location: [facility] CATH LAB;  Service: Cardiovascular;  Laterality: N/A;   ABDOMINAL AORTOGRAM W/LOWER EXTREMITY N/A 09/16/2020   Procedure: ABDOMINAL AORTOGRAM W/LOWER EXTREMITY;  Surgeon: Myra Gianotti,  Fran Lowes, MD;  Location: [facility] INVASIVE CV LAB;  Service: Cardiovascular;  Laterality: N/A;   ANTERIOR CERVICAL DECOMP/DISCECTOMY FUSION  10/2001   C5--C7   ANTERIOR LATERAL LUMBAR FUSION 4 LEVELS Right 05/28/2014   Procedure: Right L4-5 L3-4 L2-3, L1-2  Anterior lateral lumbar fusion with percutaneaous pedicle screws. Lumbar four/five, three/four, two/three and possible two/one;  Surgeon: Maeola Harman, MD;  Location: [facility] NEURO ORS;  Service: Neurosurgery;  Laterality: Right;  Lumbar One-Five Fusion with  Percutaneous Screws   BUNIONECTOMY Right    1991 and 1994   CARDIAC CATHETERIZATION  03/26/1999   in [place];  per pt medically managed , no intervention   CARPAL TUNNEL RELEASE Right 09/30/2006   CATARACT EXTRACTION W/ INTRAOCULAR LENS IMPLANT Bilateral 2014   COLONOSCOPY  08/26/2020   CORONARY ANGIOPLASTY WITH STENT PLACEMENT  06/02/2000   in [place]; x1 stent   CORONARY STENT INTERVENTION N/A 05/11/2019   Procedure: CORONARY STENT INTERVENTION;  Surgeon: Kathleene Hazel, MD;  Location: [facility] INVASIVE CV LAB;  Service: Cardiovascular;  Laterality: N/A;   ENDARTERECTOMY FEMORAL Right 10/10/2014   Procedure: RIGHT FEMORAL ARTERY ENDARTERECTOMY  WITH VASCU GUARD PATCH ANGIOPLASTY;  Surgeon: Nada Libman, MD;  Location: [facility] OR;  Service: Vascular;  Laterality: Right;   ENDARTERECTOMY FEMORAL Left 11/05/2020   Procedure: LEFT FEMORAL ENDARTERECTOMY WITH PATCH ANGIOPLASTY;  Surgeon: Nada Libman, MD;  Location: [facility] OR;  Service: Vascular;  Laterality: Left;   FEMORAL ENDARTERECTOMY Left 11/05/2020   LEFT FEMORAL ENDARTERECTOMY WITH PATCH ANGIOPLASTY (Left    FINGER ARTHRODESIS Right 09/2009   right thumb   HAMMER TOE SURGERY Left    06/ 2007 and 08/ 2008   HYDRADENITIS EXCISION N/A 09/25/2021   Procedure: EXCISION HIDRADENITIS, INTERROGATION OF PERINEAL WOUND;  Surgeon: Andria Meuse, MD;  Location: [facility];  Service: General;  Laterality: N/A;   INSERTION OF ILIAC STENT Left 11/05/2020    INSERTION OF ILIAC STENT (Left )   INSERTION OF ILIAC STENT Left 11/05/2020   Procedure: INSERTION OF ILIAC STENT;  Surgeon: Nada Libman, MD;  Location: [facility] OR;  Service: Vascular;  Laterality: Left;   LUMBAR PERCUTANEOUS PEDICLE SCREW 4 LEVEL N/A 05/28/2014   Procedure: LUMBAR PERCUTANEOUS PEDICLE SCREW 4 LEVEL;  Surgeon: Maeola Harman, MD;  Location: [facility] NEURO [facility]S;  Service: Neurosurgery;  Laterality: N/A;   LUMBAR SPINE SURGERY     12/ 1997 and 04/ 2005    NEUROMA SURGERY Right 1996   right foot   PERIPHERAL INTRAVASCULAR LITHOTRIPSY Left 11/05/2020   Procedure: INTRAVASCULAR LITHOTRIPSY;  Surgeon: Nada Libman, MD;  Location: [facility] OR;  Service: Vascular;  Laterality: Left;  shockwave   PERIPHERAL VASCULAR CATHETERIZATION N/A 12/07/2016   Procedure: Abdominal Aortogram w/Lower Extremity;  Surgeon: Nada Libman, MD;  [facility] CV LAB;  Service: Cardiovascular;  Laterality: N/A;   RECTAL EXAM UNDER ANESTHESIA N/A 09/25/2021   Procedure: ANORECTAL EXAM UNDER ANESTHESIA;  Surgeon: Andria Meuse, MD;  Location: [facility];  Service: General;  Laterality: N/A;   RIGHT/LEFT HEART CATH AND CORONARY ANGIOGRAPHY N/A 05/11/2019   Procedure: RIGHT/LEFT HEART CATH AND CORONARY ANGIOGRAPHY;  Surgeon: Dolores Patty, MD;  Location: [facility] INVASIVE CV LAB;  Service: Cardiovascular;  Laterality: N/A;   TONSILLECTOMY  1965   Social History:  reports that he quit smoking about 36 years ago. His smoking use included cigarettes. He started smoking about 64 years ago. He has a 56 pack-year smoking history. He has never  used smokeless tobacco. He reports current alcohol use. He reports that he does not use drugs.  Allergies  Allergen Reactions   Spiriva Respimat [Tiotropium Bromide Monohydrate]     Other reaction(s): Throat irritation    Family History  Problem Relation Age of Onset   Heart disease Father 49   Cancer Father    Hyperlipidemia Father    Hypertension Father    Heart attack Father    Cancer Mother    Deep vein thrombosis Mother        Varicose veins   Diabetes Mother    Hyperlipidemia Mother    Hypertension Mother     Prior to Admission medications   Medication Sig Start Date End Date Taking? Authorizing Provider  Albuterol Sulfate, sensor, (PROAIR DIGIHALER) 108 (90 Base) MCG/ACT AEPB Inhale into the lungs as needed.    [provider]  ASPIRIN LOW DOSE 81 MG EC tablet Take 81 mg by mouth  every evening.  05/24/19   [provider]  BENFOTIAMINE PO Take 250 mg by mouth 2 (two) times daily.    [provider]  bisoprolol (ZEBETA) 5 MG tablet Take 1 tablet by mouth at bedtime. 07/24/20   [provider]  CINNAMON PO Take 1,000 mg by mouth every evening.     [provider]  doxazosin (CARDURA) 2 MG tablet Take 2 mg by mouth every evening.    [provider]  doxycycline (ADOXA) 50 MG tablet Take 50 mg by mouth daily.    [provider]  empagliflozin (JARDIANCE) 25 MG TABS tablet Take 12.5 mg by mouth daily.    [provider]  fish oil-omega-3 fatty acids 1000 MG capsule Take 2 g by mouth daily.    [provider]  fluticasone (FLONASE) 50 MCG/ACT nasal spray Place 2 sprays into both nostrils at bedtime as needed for allergies or rhinitis.     [provider]  furosemide (LASIX) 20 MG tablet Take 20 mg by mouth daily at 12 noon.     [provider]  GARLIC PO Take 4,098 mg by mouth daily.     [provider]  ibuprofen (ADVIL) 200 MG tablet Take 400 mg by mouth every 6 (six) hours as needed for headache or moderate pain.     [provider]  isosorbide mononitrate (IMDUR) 30 MG 24 hr tablet TAKE 1 TABLET BY MOUTH  DAILY 09/18/19   Bensimhon, Bevelyn Buckles, MD  L-Lysine 1000 MG TABS Take 1,000 mg by mouth daily.    [provider]  losartan (COZAAR) 100 MG tablet Take 100 mg by mouth daily.    [provider]  metFORMIN (GLUCOPHAGE) 1000 MG tablet Take 1,000 mg by mouth 2 (two) times daily with a meal.    [provider]  Multiple Vitamin (MULTIVITAMIN) tablet Take 1 tablet by mouth daily.    [provider]  nitroGLYCERIN (NITROSTAT) 0.4 MG SL tablet Place 1 tablet (0.4 mg total) under the tongue every 5 (five) minutes as needed for chest pain. 05/18/19 01/20/24  Bensimhon, Bevelyn Buckles, MD  pentoxifylline (TRENTAL) 400 MG CR tablet TAKE 1 TABLET BY MOUTH   TWICE DAILY 08/18/20   Maeola Harman, MD  rOPINIRole (REQUIP) 1 MG tablet Take 1 mg by mouth every evening.     [provider]  Semaglutide (OZEMPIC, 1 MG/DOSE, [place]) Inject 2 mg into the skin once a week.    [provider]  simvastatin (ZOCOR) 40 MG tablet Take  40 mg by mouth every evening.    [provider]  SUPER B COMPLEX/C PO Take 1 tablet by mouth daily.    [provider]  umeclidinium-vilanterol [facility] ELLIPTA) 62.5-25 MCG/INH AEPB INHALE 1 INHALATION BY  MOUTH INTO THE LUNGS DAILY 09/15/21   [name], MD    Physical Exam: Vitals:   10/04/23 1900 10/04/23 1928 10/04/23 1947 10/04/23 2030  BP: (!) 150/89 (!) 149/84 (!) 155/80 (!) 153/76  Pulse: 72 75 70 73  Resp: 20 16 (!) 21 18  Temp:  97.6 F (36.4 C) (!) 97 F (36.1 C) 98.1 F (36.7 C)  TempSrc:  Oral  Oral  SpO2: 95% 94% 93% 97%  Weight:      Height:       Constitutional: NAD, calm, comfortable Eyes: PERRL, lids and conjunctivae normal ENMT: Mucous membranes are moist. Posterior pharynx clear of any exudate or lesions.Normal dentition.  Neck: normal, supple, no masses, no thyromegaly Respiratory: clear to auscultation bilaterally, no wheezing, no crackles. Normal respiratory effort. No accessory muscle use.  Cardiovascular: Regular rate and rhythm, no murmurs / rubs / gallops. No extremity edema. 2+ pedal pulses. No carotid bruits.  Abdomen: no tenderness, no masses palpated. No hepatosplenomegaly. Bowel sounds positive.  Musculoskeletal: Good range of motion, no joint swelling or tenderness, Skin: no rashes, lesions, ulcers. No induration Neurologic: CN 2-12 grossly intact. Sensation intact, DTR normal. Strength 5/5 in all 4.  Psychiatric: Normal judgment and insight. Alert and oriented x 3. Normal mood  Data Reviewed:  Chemistry showed glucose 114 BUN 35 and creatinine 1.68.  Platelets 141.  Urinalysis negative urine drug screen positive for THC.  Alcohol level less  than 10.  EKG showed normal sinus rhythm.  Head CT without contrast showed no acute findings.  CTA head and neck showed occlusion versus critical stenosis of the proximal right vertebral artery.  Over 65% stenosis of proximal right ICA in the neck as well as mild to moderate bilateral intracranial ICA intra-abdominal vertebral artery stenosis  Assessment and Plan:  #1 TIA: Patient has significant risk factors for coronary artery disease including his hypertension, hyperlipidemia and known history of coronary artery disease.  Will admit the patient.  Get MRI of the brain.  The CTA is not very clear.  We may do carotid Dopplers again.  Neurology consult.  Dual antiplatelet therapy may be indicated as well as statin.  Patient has mild thrombocytopenia so we will be careful with aspirin and Plavix.  #2 essential hypertension: Continue home regimen.  Avoid hypotension  #3 non-insulin-dependent diabetes: Initiate sliding scale insulin and hold oral hypoglycemics.  #4 peripheral arterial disease: Continue home regimen.  #5 morbid obesity, continue with dietary counseling  #6 COPD: No acute exacerbation  #7 obstructive sleep apnea: CPAP at night  #8 coronary artery disease: Stable at baseline.      Advance Care Planning:   Code Status: Prior full code  Consults: Neurology  Family Communication: Wife at bedside  Severity of Illness: The appropriate patient status for this patient is OBSERVATION. Observation status is judged to be reasonable and necessary in order to provide the required intensity of service to ensure the patient's safety. The patient's presenting symptoms, physical exam findings, and initial radiographic and laboratory data in the context of their medical condition is felt to place them at decreased risk for further clinical deterioration. Furthermore, it is anticipated that the patient will be medically stable for discharge from the hospital within 2 midnights of admission.  AuthorLonia Blood, MD 10/04/2023 9:40 PM  For on call review [email or web address].

## 2023-10-04 NOTE — Progress Notes (Signed)
Patient back to CT department post Neurologist exam for CTA/P.

## 2023-10-04 NOTE — Progress Notes (Addendum)
Stroke Alert cart activation at 1511. Patient to CT at 1513. Neurologist, Dr Thana Farr, joins the telemedicine cart at 1517. Patient back from CT at 1523. MRS 2.

## 2023-10-05 ENCOUNTER — Observation Stay (HOSPITAL_COMMUNITY): Payer: Medicare Other

## 2023-10-05 DIAGNOSIS — I6389 Other cerebral infarction: Secondary | ICD-10-CM | POA: Diagnosis not present

## 2023-10-05 DIAGNOSIS — R2990 Unspecified symptoms and signs involving the nervous system: Secondary | ICD-10-CM | POA: Diagnosis not present

## 2023-10-05 LAB — LIPID PANEL
Cholesterol: 126 mg/dL (ref 0–200)
HDL: 44 mg/dL (ref >40)
LDL Cholesterol: 51 mg/dL (ref 0–99)
Total CHOL/HDL Ratio: 2.9 {ratio}
Triglycerides: 154 mg/dL — ABNORMAL HIGH (ref <150)
VLDL: 31 mg/dL (ref 0–40)

## 2023-10-05 LAB — GLUCOSE, CAPILLARY
Glucose-Capillary: 97 mg/dL (ref 70–99)
Glucose-Capillary: 159 mg/dL — ABNORMAL HIGH (ref 70–99)
Glucose-Capillary: 109 mg/dL — ABNORMAL HIGH (ref 70–99)
Glucose-Capillary: 152 mg/dL — ABNORMAL HIGH (ref 70–99)

## 2023-10-05 LAB — ECHOCARDIOGRAM COMPLETE
AR max vel: 2.59 cm2
AV Area VTI: 2.41 cm2
AV Area mean vel: 2.49 cm2
AV Mean grad: 2 mm[Hg]
AV Peak grad: 3.1 mm[Hg]
Ao pk vel: 0.89 m/s
Area-P 1/2: 3.56 cm2
Height: 68 in
S' Lateral: 2.5 cm
Weight: 3840 [oz_av]

## 2023-10-05 NOTE — Evaluation (Signed)
Physical Therapy Evaluation and Discharge Patient Details Name: Anthony [name] MRN: [id number] DOB: 06-29-1946 Today's Date: 10/05/2023  History of Present Illness  Anthony [name] is a 77 y.o. male admitted 10/8 with stroke like symptoms. MRI: 1 cm acute ischemic nonhemorrhagic left thalamic lacunar infarct. PMHx: coronary artery disease, peripheral vascular disease, COPD, essential hypertension, obstructive sleep apnea, osteoarthritis, history of cardiac stents   Clinical Impression  Patient evaluated by Physical Therapy with no further acute PT needs identified. All education has been completed and the patient has no further questions. Safely completed BERG and DGI standardized tests with scores indicating low fall risk. Pt with hx of Lt ankle dorsiflexion weakness from prior back surgery, otherwise no focal deficits noted upon assessment. Mobilizing well, and feels back to functional baseline with motor tasks. Able to recall and follow 3-step-commands. Wife present and supportive. Reviewed BE-FAST acronym with pt and wife. All questions answered. No further PT indicated at this time. PT is signing off. Thank you for this referral.         If plan is discharge home, recommend the following: Assist for transportation   Can travel by private vehicle    yes    Equipment Recommendations None recommended by PT     Functional Status Assessment Patient has not had a recent decline in their functional status     Precautions / Restrictions Precautions Precautions: None Restrictions Weight Bearing Restrictions: No      Mobility  Bed Mobility Overal bed mobility: Modified Independent             General bed mobility comments: No assist, extra time.    Transfers Overall transfer level: Modified independent Equipment used: None               General transfer comment: Able to rise with some effort, no hands, no assist needed.    Ambulation/Gait Ambulation/Gait  assistance: Modified independent (Device/Increase time) Gait Distance (Feet): 175 Feet Assistive device: None Gait Pattern/deviations: WFL(Within Functional Limits) Gait velocity: WFL Gait velocity interpretation: >2.62 ft/sec, indicative of community ambulatory   General Gait Details: Within functional limits for tasks assessed. Challenged with dynamic, higher level balance activities. Minimal sway, able to self correct and recognize. No physical assist needed  Stairs            Wheelchair Mobility     Tilt Bed    Modified Rankin (Stroke Patients Only) Modified Rankin (Stroke Patients Only) Pre-Morbid Rankin Score: No symptoms Modified Rankin: No significant disability     Balance                                 Standardized Balance Assessment Standardized Balance Assessment : Berg Balance Test, Dynamic Gait Index Berg Balance Test Sit to Stand: Able to stand without using hands and stabilize independently Standing Unsupported: Able to stand safely 2 minutes Sitting with Back Unsupported but Feet Supported on Floor or Stool: Able to sit safely and securely 2 minutes Stand to Sit: Sits safely with minimal use of hands Transfers: Able to transfer safely, minor use of hands Standing Unsupported with Eyes Closed: Able to stand 10 seconds safely Standing Ubsupported with Feet Together: Able to place feet together independently and stand 1 minute safely From Standing, Reach Forward with Outstretched Arm: Can reach confidently >25 cm (10") From Standing Position, Pick up Object from Floor: Able to pick up shoe safely and easily From Standing Position,  Turn to Look Behind Over each Shoulder: Looks behind from both sides and weight shifts well Turn 360 Degrees: Able to turn 360 degrees safely in 4 seconds or less Standing Unsupported, Alternately Place Feet on Step/Stool: Able to stand independently and complete 8 steps >20 seconds Standing Unsupported, One Foot in  Front: Able to plae foot ahead of the other independently and hold 30 seconds Standing on One Leg: Tries to lift leg/unable to hold 3 seconds but remains standing independently Total Score: 51 Dynamic Gait Index Level Surface: Normal Change in Gait Speed: Mild Impairment Gait with Horizontal Head Turns: Normal Gait with Vertical Head Turns: Normal Gait and Pivot Turn: Normal Step Over Obstacle: Mild Impairment Step Around Obstacles: Normal Steps: Mild Impairment Total Score: 21       Pertinent Vitals/Pain Pain Assessment Pain Assessment: No/denies pain    Home Living Family/patient expects to be discharged to:: Private residence Living Arrangements: Spouse/significant other Available Help at Discharge: Family;Available 24 hours/day Type of Home: Other(Comment) (Condo) Home Access: Level entry       Home Layout: One level Home Equipment: Shower seat - built in;Cane - single Librarian, academic (2 wheels);Rollator (4 wheels)      Prior Function Prior Level of Function : Independent/Modified Independent;History of Falls (last six months);Driving             Mobility Comments: Mod I, only takes rollator outdoors if he will be walking a lot in case he needs to sit. Reports he tripped and had 1 fall in the past 6 months. ADLs Comments: ind     Extremity/Trunk Assessment   Upper Extremity Assessment Upper Extremity Assessment: Defer to OT evaluation    Lower Extremity Assessment Lower Extremity Assessment: Overall WFL for tasks assessed (Lt ankle DF 4-/5 (reports baseline from back surgery) Also reports hx of peripheral neuropathy.)       Communication   Communication Communication: No apparent difficulties  Cognition Arousal: Alert Behavior During Therapy: WFL for tasks assessed/performed Overall Cognitive Status:  (Oriented x4, provided wrong year initially but recognized and quickly corrected.)                                           General Comments General comments (skin integrity, edema, etc.): Wife present, reports pt appears back to baseline, maybe a little delayed in responses.    Exercises     Assessment/Plan    PT Assessment Patient does not need any further PT services  PT Problem List         PT Treatment Interventions      PT Goals (Current goals can be found in the Care Plan section)  Acute Rehab PT Goals Patient Stated Goal: Go home PT Goal Formulation: All assessment and education complete, DC therapy    Frequency       Co-evaluation               AM-PAC PT "6 Clicks" Mobility  Outcome Measure Help needed turning from your back to your side while in a flat bed without using bedrails?: None Help needed moving from lying on your back to sitting on the side of a flat bed without using bedrails?: None Help needed moving to and from a bed to a chair (including a wheelchair)?: None Help needed standing up from a chair using your arms (e.g., wheelchair or bedside chair)?: None Help needed to  walk in hospital room?: None Help needed climbing 3-5 steps with a railing? : None 6 Click Score: 24    End of Session Equipment Utilized During Treatment: Gait belt Activity Tolerance: Patient tolerated treatment well Patient left: in bed;with call bell/phone within reach;with bed alarm set;with family/visitor present Nurse Communication: Mobility status PT Visit Diagnosis: Other symptoms and signs involving the nervous system (R29.898)    Time: 8657-8469 PT Time Calculation (min) (ACUTE ONLY): 21 min   Charges:   PT Evaluation $PT Eval Low Complexity: 1 Low   PT General Charges $$ ACUTE PT VISIT: 1 Visit         Kathlyn [place], PT, DPT [facility]  Rehabilitation Services Physical Therapist Office: [phone] Website: [email or web address]   Berton Mount 10/05/2023, 11:12 AM

## 2023-10-05 NOTE — Care Management Obs Status (Signed)
MEDICARE OBSERVATION STATUS NOTIFICATION   Patient Details  Name: Anthony [name] MRN: [id number] Date of Birth: 1946-10-02   Medicare Observation Status Notification Given:  Yes    Lawerance Sabal, RN 10/05/2023, 8:10 AM

## 2023-10-05 NOTE — Progress Notes (Signed)
distress Respiratory system: Clear to auscultation. Respiratory effort normal.  RR 15 Cardiovascular system: S1 & S2 heard, RRR. No JVD, murmurs, rubs, gallops or clicks. No pedal edema. Gastrointestinal system: Abdomen is nondistended, soft and nontender. Normal bowel sounds heard. Central nervous system: Alert and oriented x 3. No focal neurological deficits. Extremities: No edema, no cyanosis, no  clubbing Skin: No rashes, lesions or ulcers Psychiatry: Judgement and insight appear normal. Mood & affect appropriate.     Data Reviewed: I have personally reviewed following labs and imaging studies  CBC: Recent Labs  Lab 10/04/23 1536 10/04/23 2214  WBC 5.8 5.4  NEUTROABS 3.3  --   HGB 14.1 14.2  HCT 43.1 43.6  MCV 92.3 91.0  PLT 141* 128*   Basic Metabolic Panel: Recent Labs  Lab 10/04/23 1536 10/04/23 2214  NA 138  --   K 5.0  --   CL 105  --   CO2 25  --   GLUCOSE 114*  --   BUN 35*  --   CREATININE 1.68* 1.42*  CALCIUM 9.4  --    GFR: Estimated Creatinine Clearance: 50.5 mL/min (A) (by C-G formula based on SCr of 1.42 mg/dL (H)). Liver Function Tests: Recent Labs  Lab 10/04/23 1536  AST 20  ALT 15  ALKPHOS 55  BILITOT 0.7  PROT 6.9  ALBUMIN 4.2   No results for input(s): "LIPASE", "AMYLASE" in the last 168 hours. No results for input(s): "AMMONIA" in the last 168 hours. Coagulation Profile: Recent Labs  Lab 10/04/23 1536  INR 1.0   Cardiac Enzymes: No results for input(s): "CKTOTAL", "CKMB", "CKMBINDEX", "TROPONINI" in the last 168 hours. BNP (last 3 results) No results for input(s): "PROBNP" in the last 8760 hours. HbA1C: Recent Labs    10/04/23 2214  HGBA1C 7.7*   CBG: Recent Labs  Lab 10/04/23 1542 10/04/23 2223 10/05/23 0628 10/05/23 1132 10/05/23 1620  GLUCAP 106* 93 97 159* 109*   Lipid Profile: Recent Labs    10/05/23 0511  CHOL 126  HDL 44  LDLCALC 51  TRIG 154*  CHOLHDL 2.9   Thyroid Function Tests: No results for input(s): "TSH", "T4TOTAL", "FREET4", "T3FREE", "THYROIDAB" in the last 72 hours. Anemia Panel: No results for input(s): "VITAMINB12", "FOLATE", "FERRITIN", "TIBC", "IRON", "RETICCTPCT" in the last 72 hours. Sepsis Labs: No results for input(s): "PROCALCITON", "LATICACIDVEN" in the last 168 hours.  No results found for this or any previous visit (from the past 240 hour(s)).       Radiology  Studies: ECHOCARDIOGRAM COMPLETE  Result Date: 10/05/2023    ECHOCARDIOGRAM REPORT   Patient Name:   Anthony [name] Date of Exam: 10/05/2023 Medical Rec #:  [id number]       Height:       68.0 in Accession #:    [id number]      Weight:       240.0 lb Date of Birth:  03/01/1946       BSA:          2.208 m Patient Age:    77 years        BP:           148/74 mmHg Patient Gender: M               HR:           69 bpm. Exam Location:  Inpatient Procedure: 2D Echo, Cardiac Doppler and Color Doppler Indications:    Stroke I63.9  History:  distress Respiratory system: Clear to auscultation. Respiratory effort normal.  RR 15 Cardiovascular system: S1 & S2 heard, RRR. No JVD, murmurs, rubs, gallops or clicks. No pedal edema. Gastrointestinal system: Abdomen is nondistended, soft and nontender. Normal bowel sounds heard. Central nervous system: Alert and oriented x 3. No focal neurological deficits. Extremities: No edema, no cyanosis, no  clubbing Skin: No rashes, lesions or ulcers Psychiatry: Judgement and insight appear normal. Mood & affect appropriate.     Data Reviewed: I have personally reviewed following labs and imaging studies  CBC: Recent Labs  Lab 10/04/23 1536 10/04/23 2214  WBC 5.8 5.4  NEUTROABS 3.3  --   HGB 14.1 14.2  HCT 43.1 43.6  MCV 92.3 91.0  PLT 141* 128*   Basic Metabolic Panel: Recent Labs  Lab 10/04/23 1536 10/04/23 2214  NA 138  --   K 5.0  --   CL 105  --   CO2 25  --   GLUCOSE 114*  --   BUN 35*  --   CREATININE 1.68* 1.42*  CALCIUM 9.4  --    GFR: Estimated Creatinine Clearance: 50.5 mL/min (A) (by C-G formula based on SCr of 1.42 mg/dL (H)). Liver Function Tests: Recent Labs  Lab 10/04/23 1536  AST 20  ALT 15  ALKPHOS 55  BILITOT 0.7  PROT 6.9  ALBUMIN 4.2   No results for input(s): "LIPASE", "AMYLASE" in the last 168 hours. No results for input(s): "AMMONIA" in the last 168 hours. Coagulation Profile: Recent Labs  Lab 10/04/23 1536  INR 1.0   Cardiac Enzymes: No results for input(s): "CKTOTAL", "CKMB", "CKMBINDEX", "TROPONINI" in the last 168 hours. BNP (last 3 results) No results for input(s): "PROBNP" in the last 8760 hours. HbA1C: Recent Labs    10/04/23 2214  HGBA1C 7.7*   CBG: Recent Labs  Lab 10/04/23 1542 10/04/23 2223 10/05/23 0628 10/05/23 1132 10/05/23 1620  GLUCAP 106* 93 97 159* 109*   Lipid Profile: Recent Labs    10/05/23 0511  CHOL 126  HDL 44  LDLCALC 51  TRIG 154*  CHOLHDL 2.9   Thyroid Function Tests: No results for input(s): "TSH", "T4TOTAL", "FREET4", "T3FREE", "THYROIDAB" in the last 72 hours. Anemia Panel: No results for input(s): "VITAMINB12", "FOLATE", "FERRITIN", "TIBC", "IRON", "RETICCTPCT" in the last 72 hours. Sepsis Labs: No results for input(s): "PROCALCITON", "LATICACIDVEN" in the last 168 hours.  No results found for this or any previous visit (from the past 240 hour(s)).       Radiology  Studies: ECHOCARDIOGRAM COMPLETE  Result Date: 10/05/2023    ECHOCARDIOGRAM REPORT   Patient Name:   Anthony [name] Date of Exam: 10/05/2023 Medical Rec #:  [id number]       Height:       68.0 in Accession #:    [id number]      Weight:       240.0 lb Date of Birth:  03/01/1946       BSA:          2.208 m Patient Age:    77 years        BP:           148/74 mmHg Patient Gender: M               HR:           69 bpm. Exam Location:  Inpatient Procedure: 2D Echo, Cardiac Doppler and Color Doppler Indications:    Stroke I63.9  History:  distress Respiratory system: Clear to auscultation. Respiratory effort normal.  RR 15 Cardiovascular system: S1 & S2 heard, RRR. No JVD, murmurs, rubs, gallops or clicks. No pedal edema. Gastrointestinal system: Abdomen is nondistended, soft and nontender. Normal bowel sounds heard. Central nervous system: Alert and oriented x 3. No focal neurological deficits. Extremities: No edema, no cyanosis, no  clubbing Skin: No rashes, lesions or ulcers Psychiatry: Judgement and insight appear normal. Mood & affect appropriate.     Data Reviewed: I have personally reviewed following labs and imaging studies  CBC: Recent Labs  Lab 10/04/23 1536 10/04/23 2214  WBC 5.8 5.4  NEUTROABS 3.3  --   HGB 14.1 14.2  HCT 43.1 43.6  MCV 92.3 91.0  PLT 141* 128*   Basic Metabolic Panel: Recent Labs  Lab 10/04/23 1536 10/04/23 2214  NA 138  --   K 5.0  --   CL 105  --   CO2 25  --   GLUCOSE 114*  --   BUN 35*  --   CREATININE 1.68* 1.42*  CALCIUM 9.4  --    GFR: Estimated Creatinine Clearance: 50.5 mL/min (A) (by C-G formula based on SCr of 1.42 mg/dL (H)). Liver Function Tests: Recent Labs  Lab 10/04/23 1536  AST 20  ALT 15  ALKPHOS 55  BILITOT 0.7  PROT 6.9  ALBUMIN 4.2   No results for input(s): "LIPASE", "AMYLASE" in the last 168 hours. No results for input(s): "AMMONIA" in the last 168 hours. Coagulation Profile: Recent Labs  Lab 10/04/23 1536  INR 1.0   Cardiac Enzymes: No results for input(s): "CKTOTAL", "CKMB", "CKMBINDEX", "TROPONINI" in the last 168 hours. BNP (last 3 results) No results for input(s): "PROBNP" in the last 8760 hours. HbA1C: Recent Labs    10/04/23 2214  HGBA1C 7.7*   CBG: Recent Labs  Lab 10/04/23 1542 10/04/23 2223 10/05/23 0628 10/05/23 1132 10/05/23 1620  GLUCAP 106* 93 97 159* 109*   Lipid Profile: Recent Labs    10/05/23 0511  CHOL 126  HDL 44  LDLCALC 51  TRIG 154*  CHOLHDL 2.9   Thyroid Function Tests: No results for input(s): "TSH", "T4TOTAL", "FREET4", "T3FREE", "THYROIDAB" in the last 72 hours. Anemia Panel: No results for input(s): "VITAMINB12", "FOLATE", "FERRITIN", "TIBC", "IRON", "RETICCTPCT" in the last 72 hours. Sepsis Labs: No results for input(s): "PROCALCITON", "LATICACIDVEN" in the last 168 hours.  No results found for this or any previous visit (from the past 240 hour(s)).       Radiology  Studies: ECHOCARDIOGRAM COMPLETE  Result Date: 10/05/2023    ECHOCARDIOGRAM REPORT   Patient Name:   Anthony [name] Date of Exam: 10/05/2023 Medical Rec #:  [id number]       Height:       68.0 in Accession #:    [id number]      Weight:       240.0 lb Date of Birth:  03/01/1946       BSA:          2.208 m Patient Age:    77 years        BP:           148/74 mmHg Patient Gender: M               HR:           69 bpm. Exam Location:  Inpatient Procedure: 2D Echo, Cardiac Doppler and Color Doppler Indications:    Stroke I63.9  History:  distress Respiratory system: Clear to auscultation. Respiratory effort normal.  RR 15 Cardiovascular system: S1 & S2 heard, RRR. No JVD, murmurs, rubs, gallops or clicks. No pedal edema. Gastrointestinal system: Abdomen is nondistended, soft and nontender. Normal bowel sounds heard. Central nervous system: Alert and oriented x 3. No focal neurological deficits. Extremities: No edema, no cyanosis, no  clubbing Skin: No rashes, lesions or ulcers Psychiatry: Judgement and insight appear normal. Mood & affect appropriate.     Data Reviewed: I have personally reviewed following labs and imaging studies  CBC: Recent Labs  Lab 10/04/23 1536 10/04/23 2214  WBC 5.8 5.4  NEUTROABS 3.3  --   HGB 14.1 14.2  HCT 43.1 43.6  MCV 92.3 91.0  PLT 141* 128*   Basic Metabolic Panel: Recent Labs  Lab 10/04/23 1536 10/04/23 2214  NA 138  --   K 5.0  --   CL 105  --   CO2 25  --   GLUCOSE 114*  --   BUN 35*  --   CREATININE 1.68* 1.42*  CALCIUM 9.4  --    GFR: Estimated Creatinine Clearance: 50.5 mL/min (A) (by C-G formula based on SCr of 1.42 mg/dL (H)). Liver Function Tests: Recent Labs  Lab 10/04/23 1536  AST 20  ALT 15  ALKPHOS 55  BILITOT 0.7  PROT 6.9  ALBUMIN 4.2   No results for input(s): "LIPASE", "AMYLASE" in the last 168 hours. No results for input(s): "AMMONIA" in the last 168 hours. Coagulation Profile: Recent Labs  Lab 10/04/23 1536  INR 1.0   Cardiac Enzymes: No results for input(s): "CKTOTAL", "CKMB", "CKMBINDEX", "TROPONINI" in the last 168 hours. BNP (last 3 results) No results for input(s): "PROBNP" in the last 8760 hours. HbA1C: Recent Labs    10/04/23 2214  HGBA1C 7.7*   CBG: Recent Labs  Lab 10/04/23 1542 10/04/23 2223 10/05/23 0628 10/05/23 1132 10/05/23 1620  GLUCAP 106* 93 97 159* 109*   Lipid Profile: Recent Labs    10/05/23 0511  CHOL 126  HDL 44  LDLCALC 51  TRIG 154*  CHOLHDL 2.9   Thyroid Function Tests: No results for input(s): "TSH", "T4TOTAL", "FREET4", "T3FREE", "THYROIDAB" in the last 72 hours. Anemia Panel: No results for input(s): "VITAMINB12", "FOLATE", "FERRITIN", "TIBC", "IRON", "RETICCTPCT" in the last 72 hours. Sepsis Labs: No results for input(s): "PROCALCITON", "LATICACIDVEN" in the last 168 hours.  No results found for this or any previous visit (from the past 240 hour(s)).       Radiology  Studies: ECHOCARDIOGRAM COMPLETE  Result Date: 10/05/2023    ECHOCARDIOGRAM REPORT   Patient Name:   Anthony [name] Date of Exam: 10/05/2023 Medical Rec #:  [id number]       Height:       68.0 in Accession #:    [id number]      Weight:       240.0 lb Date of Birth:  03/01/1946       BSA:          2.208 m Patient Age:    77 years        BP:           148/74 mmHg Patient Gender: M               HR:           69 bpm. Exam Location:  Inpatient Procedure: 2D Echo, Cardiac Doppler and Color Doppler Indications:    Stroke I63.9  History:  distress Respiratory system: Clear to auscultation. Respiratory effort normal.  RR 15 Cardiovascular system: S1 & S2 heard, RRR. No JVD, murmurs, rubs, gallops or clicks. No pedal edema. Gastrointestinal system: Abdomen is nondistended, soft and nontender. Normal bowel sounds heard. Central nervous system: Alert and oriented x 3. No focal neurological deficits. Extremities: No edema, no cyanosis, no  clubbing Skin: No rashes, lesions or ulcers Psychiatry: Judgement and insight appear normal. Mood & affect appropriate.     Data Reviewed: I have personally reviewed following labs and imaging studies  CBC: Recent Labs  Lab 10/04/23 1536 10/04/23 2214  WBC 5.8 5.4  NEUTROABS 3.3  --   HGB 14.1 14.2  HCT 43.1 43.6  MCV 92.3 91.0  PLT 141* 128*   Basic Metabolic Panel: Recent Labs  Lab 10/04/23 1536 10/04/23 2214  NA 138  --   K 5.0  --   CL 105  --   CO2 25  --   GLUCOSE 114*  --   BUN 35*  --   CREATININE 1.68* 1.42*  CALCIUM 9.4  --    GFR: Estimated Creatinine Clearance: 50.5 mL/min (A) (by C-G formula based on SCr of 1.42 mg/dL (H)). Liver Function Tests: Recent Labs  Lab 10/04/23 1536  AST 20  ALT 15  ALKPHOS 55  BILITOT 0.7  PROT 6.9  ALBUMIN 4.2   No results for input(s): "LIPASE", "AMYLASE" in the last 168 hours. No results for input(s): "AMMONIA" in the last 168 hours. Coagulation Profile: Recent Labs  Lab 10/04/23 1536  INR 1.0   Cardiac Enzymes: No results for input(s): "CKTOTAL", "CKMB", "CKMBINDEX", "TROPONINI" in the last 168 hours. BNP (last 3 results) No results for input(s): "PROBNP" in the last 8760 hours. HbA1C: Recent Labs    10/04/23 2214  HGBA1C 7.7*   CBG: Recent Labs  Lab 10/04/23 1542 10/04/23 2223 10/05/23 0628 10/05/23 1132 10/05/23 1620  GLUCAP 106* 93 97 159* 109*   Lipid Profile: Recent Labs    10/05/23 0511  CHOL 126  HDL 44  LDLCALC 51  TRIG 154*  CHOLHDL 2.9   Thyroid Function Tests: No results for input(s): "TSH", "T4TOTAL", "FREET4", "T3FREE", "THYROIDAB" in the last 72 hours. Anemia Panel: No results for input(s): "VITAMINB12", "FOLATE", "FERRITIN", "TIBC", "IRON", "RETICCTPCT" in the last 72 hours. Sepsis Labs: No results for input(s): "PROCALCITON", "LATICACIDVEN" in the last 168 hours.  No results found for this or any previous visit (from the past 240 hour(s)).       Radiology  Studies: ECHOCARDIOGRAM COMPLETE  Result Date: 10/05/2023    ECHOCARDIOGRAM REPORT   Patient Name:   Anthony [name] Date of Exam: 10/05/2023 Medical Rec #:  [id number]       Height:       68.0 in Accession #:    [id number]      Weight:       240.0 lb Date of Birth:  03/01/1946       BSA:          2.208 m Patient Age:    77 years        BP:           148/74 mmHg Patient Gender: M               HR:           69 bpm. Exam Location:  Inpatient Procedure: 2D Echo, Cardiac Doppler and Color Doppler Indications:    Stroke I63.9  History:  PROGRESS NOTE    Anthony [name]  HYQ:[id number] DOB: March 10, 1946 DOA: 10/04/2023 PCP: Creola Corn, MD  Brief Narrative:  This 77 yrs old male with PMH significant of coronary artery disease, peripheral vascular disease, COPD, essential hypertension, obstructive sleep apnea, osteoarthritis, history of cardiac stents who was apparently playing cards when he suddenly started having slurred speech, transient altered mental status, and had strokelike symptoms.  Patient was taken to the ED where he was evaluated.  Initial head CT without contrast was negative.  Symptoms appear to have improved.  Urine drug screen is positive for THC,  alcohol level less than 10.  Patient appears to have had strokelike symptoms.  Neurology was consulted.  Recommendation was to admit for CVA workup.  Patient has received aspirin and is being admitted to the hospital for further evaluation and treatment.   Assessment & Plan:   Principal Problem:   Stroke-like symptom Active Problems:   COPD (chronic obstructive pulmonary disease) (HCC)   OSA (obstructive sleep apnea)   Morbid obesity with BMI of 40.0-44.9, adult (HCC)   Coronary atherosclerosis of native coronary artery   PAD (peripheral artery disease) (HCC)   HTN (hypertension)   Type 2 diabetes mellitus (HCC)  Acute CVA: Patient with significant risk factors for coronary artery disease including hypertension, hyperlipidemia and known history of coronary artery disease.   He presented with sudden onset of slurred speech and transient altered mental status and strokelike symptoms which improved. MRI brain shows 1 cm acute ischemic nonhemorrhagic left thalamic lacunar infarct.   The CTA is not very clear.  Carotid duplex is right ICA 60 to 79% blockage, left ICA 39 to 40% blockage Neurology recommended dual antiplatelet therapy as well as statin.   Patient has mild thrombocytopenia so we will be careful with aspirin and Plavix.   Essential hypertension:   Continue home regimen.  Avoid hypotension   Non insulin-dependent diabetes:  Initiate sliding scale insulin and hold oral hypoglycemics.   Peripheral arterial disease: Continue home regimen.   Morbid obesity:  continue with dietary counseling   COPD: No acute exacerbation   obstructive sleep apnea: CPAP at night   Coronary artery disease: Stable at baseline.     DVT prophylaxis:SCDs Code Status: Full code Family Communication:  Wife at bed side. Disposition Plan:   Status is: Observation The patient remains OBS appropriate and will d/c before 2 midnights.   Admitted for acute stroke.  Consultants:  Neurology  Procedures: MRI brain Antimicrobials: None  Subjective: Patient was seen and examined at bedside.  Overnight events noted.   Patient was participating with physical therapy.  He stated he is doing better and wants to be discharged.  Objective: Vitals:   10/05/23 0338 10/05/23 0822 10/05/23 1130 10/05/23 1621  BP: (!) 112/57 (!) 142/73 (!) 135/51 137/75  Pulse: 73 69 67 64  Resp: 19 18 19 18   Temp: 97.9 F (36.6 C) 97.6 F (36.4 C) 98.2 F (36.8 C) 97.7 F (36.5 C)  TempSrc:  Oral Oral Oral  SpO2: 98% 96% 94% 99%  Weight:      Height:        Intake/Output Summary (Last 24 hours) at 10/05/2023 1643 Last data filed at 10/05/2023 1145 Gross per 24 hour  Intake 240 ml  Output 0 ml  Net 240 ml   Filed Weights   10/04/23 1502 10/04/23 1729  Weight: 99.8 kg 108.9 kg    Examination:  General exam: Appears calm and comfortable, not in any acute

## 2023-10-05 NOTE — Progress Notes (Signed)
OT Cancellation Note  Patient Details Name: Anthony [name] MRN: [id number] DOB: 15-Apr-1946   Cancelled Treatment:    Reason Eval/Treat Not Completed: OT screened, no needs identified, will sign off (Per PT, pt dpes not have acute OT needs, will sign off. Please re-consult if needs change. Thank you.)  Donia Pounds 10/05/2023, 10:52 AM

## 2023-10-05 NOTE — Plan of Care (Signed)
  Problem: Education: Goal: Knowledge of General Education information will improve Description: Including pain rating scale, medication(s)/side effects and non-pharmacologic comfort measures Outcome: Progressing   Problem: Health Behavior/Discharge Planning: Goal: Ability to manage health-related needs will improve Outcome: Progressing   Problem: Clinical Measurements: Goal: Ability to maintain clinical measurements within normal limits will improve Outcome: Progressing Goal: Will remain free from infection Outcome: Progressing Goal: Diagnostic test results will improve Outcome: Progressing Goal: Respiratory complications will improve Outcome: Progressing Goal: Cardiovascular complication will be avoided Outcome: Progressing   Problem: Activity: Goal: Risk for activity intolerance will decrease Outcome: Progressing   Problem: Nutrition: Goal: Adequate nutrition will be maintained Outcome: Progressing   Problem: Pain Managment: Goal: General experience of comfort will improve Outcome: Progressing   Problem: Skin Integrity: Goal: Risk for impaired skin integrity will decrease Outcome: Progressing   Problem: Education: Goal: Knowledge of disease or condition will improve Outcome: Progressing Goal: Knowledge of secondary prevention will improve (MUST DOCUMENT ALL) Outcome: Progressing Goal: Knowledge of patient specific risk factors will improve Loraine Leriche N/A or DELETE if not current risk factor) Outcome: Progressing

## 2023-10-05 NOTE — Progress Notes (Addendum)
STROKE TEAM PROGRESS NOTE   BRIEF HPI [name] is a 77 y.o. male with history of CAD, HTN, HLD, OSA, DM and PVD who was playing cards today and became acutely confused. Speech was noted to be slurred and patient complained of dizziness. Family was called and patient was brought to the ED for further evaluation.    SIGNIFICANT HOSPITAL EVENTS   INTERIM HISTORY/SUBJECTIVE  Patient laying in the bed in no apparent distress.  Family at the bedside.  Patient states yesterday he developed dizziness with slurred speech that lasted about 5 hours and has now completely resolved   OBJECTIVE  CBC    Component Value Date/Time   WBC 5.4 10/04/2023 2214   RBC 4.79 10/04/2023 2214   HGB 14.2 10/04/2023 2214   HCT 43.6 10/04/2023 2214   PLT 128 (L) 10/04/2023 2214   MCV 91.0 10/04/2023 2214   MCH 29.6 10/04/2023 2214   MCHC 32.6 10/04/2023 2214   RDW 13.2 10/04/2023 2214   LYMPHSABS 1.7 10/04/2023 1536   MONOABS 0.6 10/04/2023 1536   EOSABS 0.2 10/04/2023 1536   BASOSABS 0.0 10/04/2023 1536    BMET    Component Value Date/Time   NA 138 10/04/2023 1536   K 5.0 10/04/2023 1536   CL 105 10/04/2023 1536   CO2 25 10/04/2023 1536   GLUCOSE 114 (H) 10/04/2023 1536   BUN 35 (H) 10/04/2023 1536   CREATININE 1.42 (H) 10/04/2023 2214   CALCIUM 9.4 10/04/2023 1536   GFRNONAA 50 (L) 10/04/2023 2214    IMAGING past 24 hours MR BRAIN WO CONTRAST  Result Date: 10/05/2023 CLINICAL DATA:  Initial evaluation for acute TIA. EXAM: MRI HEAD WITHOUT CONTRAST TECHNIQUE: Multiplanar, multiecho pulse sequences of the brain and surrounding structures were obtained without intravenous contrast. COMPARISON:  Prior CTs from 10/04/2023. FINDINGS: Brain: Mild age-related cerebral atrophy. Patchy T2/FLAIR hyperintensity involving the periventricular and deep white matter both cerebral hemispheres, consistent with chronic small vessel ischemic disease, mild for age. 1 cm acute ischemic nonhemorrhagic  infarct seen involving the left thalamus (series 5, image 82). No significant mass effect. No other evidence for acute or subacute ischemia. Gray-white matter differentiation otherwise maintained. No acute intracranial hemorrhage. Few punctate chronic micro hemorrhages noted at the left occipital lobe (series 8, image 26), of doubtful significance in isolation. No mass lesion, midline shift or mass effect. No hydrocephalus or extra-axial fluid collection. Pituitary gland and suprasellar region within normal limits. Vascular: Major intracranial vascular flow voids are maintained. Skull and upper cervical spine: Degenerative thickening noted about the tectorial membrane. Postoperative changes noted within the visualized upper cervical spine. Craniocervical junction otherwise unremarkable. Bone marrow signal intensity normal. No scalp soft tissue abnormality. Sinuses/Orbits: Prior bilateral ocular lens replacement. Mild scattered mucosal thickening noted about the ethmoidal air cells and maxillary sinuses. No significant mastoid effusion. Other: None. IMPRESSION: 1. 1 cm acute ischemic nonhemorrhagic left thalamic lacunar infarct. 2. Underlying mild age-related cerebral atrophy with chronic small vessel ischemic disease. Electronically Signed   By: Rise Mu M.D.   On: 10/05/2023 00:45   CT ANGIO HEAD NECK W WO CM W PERF (CODE STROKE)  Result Date: 10/04/2023 CLINICAL DATA:  Neuro deficit, acute, stroke suspected EXAM: CT ANGIOGRAPHY HEAD AND NECK CT PERFUSION BRAIN TECHNIQUE: Multidetector CT imaging of the head and neck was performed using the standard protocol during bolus administration of intravenous contrast. Multiplanar CT image reconstructions and MIPs were obtained to evaluate the vascular anatomy. Carotid stenosis measurements (when applicable) are  obtained utilizing NASCET criteria, using the distal internal carotid diameter as the denominator. Multiphase CT imaging of the brain was performed  following IV bolus contrast injection. Subsequent parametric perfusion maps were calculated using RAPID software. RADIATION DOSE REDUCTION: This exam was performed according to the departmental dose-optimization program which includes automated exposure control, adjustment of the mA and/or kV according to patient size and/or use of iterative reconstruction technique. CONTRAST:  OMNIPAQUE IOHEXOL 350 MG/ML SOLN COMPARISON:  None Available. FINDINGS: CTA: Aortic arch: Aortic atherosclerosis. The visualized great vessel origins are patent. Right carotid system: Atherosclerosis involving the carotid bifurcation. Proximal ICA with approximately 65% stenosis. Left carotid system: Atherosclerosis at the carotid bifurcation without greater than 50% stenosis. Vertebral arteries: Occlusion versus critical stenosis of the proximal right vertebral artery, not well evaluated due to streak artifact from adjacent spinal fusion. More distal right vertebral artery is opacified. Skeleton: Multilevel corpectomy and anterior fusion in the cervical spine. Other neck: No acute abnormality on limited assessment. Upper chest: Emphysema. Review of the MIP images confirms the above findings CTA HEAD FINDINGS Anterior circulation: Bilateral intracranial ICAs are patent with mild to moderate atherosclerotic narrowing due to predominately calcific atherosclerosis. Bilateral MCAs and ACAs are patent without proximal hemodynamically significant stenosis. Posterior circulation: Mild-to-moderate atherosclerotic narrowing of the vertebral arteries bilaterally due to atherosclerosis. The basilar artery and bilateral posterior cerebral arteries are patent without proximal hemodynamically significant stenosis. Venous sinuses: As permitted by contrast timing, patent. CT Brain Perfusion Findings: ASPECTS: 10 CBF (<30%) Volume: 0mL Perfusion (Tmax>6.0s) volume: 5mL Mismatch Volume: 5mL IMPRESSION: CTA: 1. Occlusion versus critical stenosis of the  proximal right vertebral artery, not well evaluated due to streak artifact from adjacent spinal fusion. More distal right vertebral artery is opacified. 2. Approximately 65% stenosis of the proximal right ICA in the neck. 3. Mild-to-moderate bilateral intracranial ICA and intradural vertebral artery stenosis. 4. Aortic Atherosclerosis (ICD10-I70.0) and Emphysema (ICD10-J43.9). CT perfusion: Area of reported mismatch/penumbra in the superior left cerebellum and posterior left brainstem. It is unclear if this represents true tissue risk or artifact. An MRI could provide more sensitive evaluation for acute infarct if clinically warranted Electronically Signed   By: Feliberto Harts M.D.   On: 10/04/2023 16:14   CT HEAD CODE STROKE WO CONTRAST`  Result Date: 10/04/2023 CLINICAL DATA:  Code stroke. Neuro deficit, acute, stroke suspected. Slurred speech. Thought disturbance. EXAM: CT HEAD WITHOUT CONTRAST TECHNIQUE: Contiguous axial images were obtained from the base of the skull through the vertex without intravenous contrast. RADIATION DOSE REDUCTION: This exam was performed according to the departmental dose-optimization program which includes automated exposure control, adjustment of the mA and/or kV according to patient size and/or use of iterative reconstruction technique. COMPARISON:  None Available. FINDINGS: Brain: Mild age related volume loss without subjective lobar predominance. No sign of old or acute infarction, mass lesion, hemorrhage, hydrocephalus or extra-axial collection. Vascular: There is atherosclerotic calcification of the major vessels at the base of the brain. No convincing acute hyperdense vessel. Skull: Normal Sinuses/Orbits: Clear/normal Other: None ASPECTS (Alberta Stroke Program Early CT Score) - Ganglionic level infarction (caudate, lentiform nuclei, internal capsule, insula, M1-M3 cortex): 7 - Supraganglionic infarction (M4-M6 cortex): 3 Total score (0-10 with 10 being normal): 10  IMPRESSION: 1. No acute CT finding. Mild age related volume loss. 2. Aspects is 10. These results were called by telephone at the time of interpretation on 10/04/2023 at 3:37 pm to provider [facility] , who verbally acknowledged these results. Electronically Signed   By:  Paulina Fusi M.D.   On: 10/04/2023 15:38    Vitals:   10/05/23 0046 10/05/23 0338 10/05/23 0822 10/05/23 1130  BP: 139/75 (!) 112/57 (!) 142/73 (!) 135/51  Pulse: 78 73 69 67  Resp: 17 19 18 19   Temp: 98.2 F (36.8 C) 97.9 F (36.6 C) 97.6 F (36.4 C) 98.2 F (36.8 C)  TempSrc: Oral  Oral Oral  SpO2: 97% 98% 96% 94%  Weight:      Height:         PHYSICAL EXAM General:  Alert, well-nourished, well-developed patient in no acute distress Psych:  Mood and affect appropriate for situation CV: Regular rate and rhythm on monitor Respiratory:  Regular, unlabored respirations on room air GI: Abdomen soft and nontender   NEURO:  Mental Status: AA&Ox3, patient is able to give clear and coherent history Speech/Language: speech is without dysarthria or aphasia.  Naming, repetition, fluency, and comprehension intact.  Cranial Nerves:  II: PERRL. Visual fields full.  III, IV, VI: EOMI. Eyelids elevate symmetrically.  V: Sensation is intact to light touch and symmetrical to face.  VII: Face is symmetrical resting and smiling VIII: hearing intact to voice. IX, X: Palate elevates symmetrically. Phonation is normal.  ZO:[id number] shrug 5/5. XII: tongue is midline without fasciculations. Motor: 5/5 strength to all muscle groups tested.  Tone: is normal and bulk is normal Sensation- Intact to light touch bilaterally. Extinction absent to light touch to DSS.   Coordination: FTN intact bilaterally, HKS: no ataxia in BLE.No drift.  Gait- deferred  ASSESSMENT/PLAN  Acute Ischemic Infarct:  left thalamic  Etiology: Large vessel disease Code Stroke  CT head No acute abnormality.  Atrophy.  ASPECTS 10.     CTA head & neck  occlusion versus critical stenosis of proximal right vertebral artery, 65% stenosis of right ICA, mild to moderate bilateral intracranial ICAs and intradural vertebral artery stenosis CT perfusion Area of reported mismatch/penumbra in the superior left cerebellum and posterior left brainstem. It is unclear if this represents true tissue risk or artifact.  MRI left thalamic infarct  2D Echo ordered LDL 51 HgbA1c 7.7 VTE prophylaxis -Lovenox aspirin 81 mg daily prior to admission, now on aspirin 81 mg daily and clopidogrel 75 mg daily for 90 days and then Plavix alone. Therapy recommendations:  Pending Disposition: Pending  Hypertension CHF CAD Home meds: Lasix, Imdur, losartan, Stable Blood Pressure Goal: BP less than 220/110   Hyperlipidemia Home meds: Zetia, simvastatin, resumed in hospital LDL 51, goal < 70 Continue statin at discharge  Diabetes type II UnControlled Home meds: Insulin, metformin, Jardiance HgbA1c 7.7, goal < 7.0 CBGs SSI Recommend close follow-up with PCP for better DM control  Dysphagia Patient has post-stroke dysphagia, SLP consulted    Diet   Diet Carb Modified Fluid consistency: Thin; Room service appropriate? Yes   Advance diet as tolerated  Other Stroke Risk Factors  ETOH use, alcohol level <10, advised to drink no more than 2 drink(s) a day  Obesity, Body mass index is 36.49 kg/m., BMI >/= 30 associated with increased stroke risk, recommend weight loss, diet and exercise as appropriate     Coronary artery disease  Congestive heart failure  Obstructive sleep apnea,  on CPAP at home   Other Active Problems PAD/PVD Peripheral neuropathy COPD  Hospital day # 0  Gevena Mart DNP, ACNPC-AG  [facility]  ATTENDING ATTESTATION:  77 year old with left thalamic CVA on MRI.  Also has vertebral occlusion on CTA.  Therefore recommend 90 days  of DAPT therapy and then Plavix alone.  He is on aspirin at home.  He feels like he is back  to his baseline.  Exam is unremarkable.  ADDENDUM: echo is negative.  Neurology will sign off  He can follow-up in stroke clinic 6 to 8 weeks at [facility].  Dr. Viviann Spare evaluated pt independently, reviewed imaging, chart, labs. Discussed and formulated plan with the Resident/APP. Changes were made to the note where appropriate. Please see APP/resident note above for details.   Total 36 minutes spent on counseling patient and coordinating care, writing notes and reviewing chart.   Anthony Rocco,MD    To contact Stroke Continuity provider, please refer to [email or web address]. After hours, contact General Neurology

## 2023-10-05 NOTE — Plan of Care (Signed)
  Problem: Education: Goal: Knowledge of General Education information will improve Description: Including pain rating scale, medication(s)/side effects and non-pharmacologic comfort measures Outcome: Progressing   Problem: Health Behavior/Discharge Planning: Goal: Ability to manage health-related needs will improve Outcome: Progressing   Problem: Clinical Measurements: Goal: Ability to maintain clinical measurements within normal limits will improve Outcome: Progressing Goal: Will remain free from infection Outcome: Progressing Goal: Diagnostic test results will improve Outcome: Progressing Goal: Respiratory complications will improve Outcome: Progressing Goal: Cardiovascular complication will be avoided Outcome: Progressing   Problem: Activity: Goal: Risk for activity intolerance will decrease Outcome: Progressing   Problem: Nutrition: Goal: Adequate nutrition will be maintained Outcome: Progressing   Problem: Coping: Goal: Level of anxiety will decrease Outcome: Progressing   Problem: Elimination: Goal: Will not experience complications related to bowel motility Outcome: Progressing Goal: Will not experience complications related to urinary retention Outcome: Progressing   Problem: Pain Managment: Goal: General experience of comfort will improve Outcome: Progressing   Problem: Safety: Goal: Ability to remain free from injury will improve Outcome: Progressing   Problem: Skin Integrity: Goal: Risk for impaired skin integrity will decrease Outcome: Progressing   Problem: Education: Goal: Knowledge of disease or condition will improve Outcome: Progressing Goal: Knowledge of secondary prevention will improve (MUST DOCUMENT ALL) Outcome: Progressing Goal: Knowledge of patient specific risk factors will improve (Mark N/A or DELETE if not current risk factor) Outcome: Progressing   Problem: Ischemic Stroke/TIA Tissue Perfusion: Goal: Complications of ischemic  stroke/TIA will be minimized Outcome: Progressing   Problem: Coping: Goal: Will verbalize positive feelings about self Outcome: Progressing Goal: Will identify appropriate support needs Outcome: Progressing   Problem: Health Behavior/Discharge Planning: Goal: Ability to manage health-related needs will improve Outcome: Progressing Goal: Goals will be collaboratively established with patient/family Outcome: Progressing   Problem: Self-Care: Goal: Ability to participate in self-care as condition permits will improve Outcome: Progressing Goal: Verbalization of feelings and concerns over difficulty with self-care will improve Outcome: Progressing Goal: Ability to communicate needs accurately will improve Outcome: Progressing   Problem: Nutrition: Goal: Risk of aspiration will decrease Outcome: Progressing Goal: Dietary intake will improve Outcome: Progressing   Problem: Education: Goal: Ability to describe self-care measures that may prevent or decrease complications (Diabetes Survival Skills Education) will improve Outcome: Progressing Goal: Individualized Educational Video(s) Outcome: Progressing   Problem: Coping: Goal: Ability to adjust to condition or change in health will improve Outcome: Progressing   Problem: Fluid Volume: Goal: Ability to maintain a balanced intake and output will improve Outcome: Progressing   Problem: Health Behavior/Discharge Planning: Goal: Ability to identify and utilize available resources and services will improve Outcome: Progressing Goal: Ability to manage health-related needs will improve Outcome: Progressing   Problem: Metabolic: Goal: Ability to maintain appropriate glucose levels will improve Outcome: Progressing   Problem: Nutritional: Goal: Maintenance of adequate nutrition will improve Outcome: Progressing Goal: Progress toward achieving an optimal weight will improve Outcome: Progressing   Problem: Skin  Integrity: Goal: Risk for impaired skin integrity will decrease Outcome: Progressing   Problem: Tissue Perfusion: Goal: Adequacy of tissue perfusion will improve Outcome: Progressing   

## 2023-10-05 NOTE — Progress Notes (Signed)
*  PRELIMINARY RESULTS* Echocardiogram 2D Echocardiogram has been performed.  Earlie Server Johnae Friley 10/05/2023, 10:22 AM

## 2023-10-05 NOTE — Evaluation (Signed)
Speech Language Pathology Evaluation Patient Details Name: Anthony [name] MRN: [id number] DOB: Feb 02, 1946 Today's Date: 10/05/2023 Time: 7829-5621 SLP Time Calculation (min) (ACUTE ONLY): 22 min  Problem List:  Patient Active Problem List   Diagnosis Date Noted   Stroke-like symptom 10/04/2023   Type 2 diabetes mellitus (HCC) 09/23/2021   Renal insufficiency 09/23/2021   Primary osteoarthritis of left knee 11/27/2020   Femoral artery occlusion, left (HCC) 11/05/2020   Unilateral primary osteoarthritis, left knee 07/10/2019   Bleeding external hemorrhoids suspected 06/07/2019   Long term current use of clopidogrel 06/07/2019   Unstable angina (HCC)    Bilateral primary osteoarthritis of knee 10/11/2017   Chronic diastolic heart failure (HCC) 05/06/2015   Femoral artery stenosis, right (HCC) 10/10/2014   Preoperative clearance 09/09/2014   Lumbar spine scoliosis 05/28/2014   Pain in limb 12/10/2013   Chest tightness 09/27/2013   HTN (hypertension) 09/27/2013   Peripheral vascular disease, unspecified (HCC) 01/08/2013   PAD (peripheral artery disease) (HCC) 11/13/2012   Atherosclerosis of native artery of extremity with intermittent claudication (HCC) 11/13/2012   Bilateral claudication of lower limb (HCC) 10/09/2012   Coronary atherosclerosis of native coronary artery 08/26/2012   COPD (chronic obstructive pulmonary disease) (HCC) 08/11/2012   OSA (obstructive sleep apnea) 08/11/2012   Diaphragm paralysis 08/11/2012   Morbid obesity with BMI of 40.0-44.9, adult (HCC) 08/11/2012   Past Medical History:  Past Medical History:  Diagnosis Date   Atherosclerosis of native artery of both lower extremities with intermittent claudication (HCC)    followed by vascular , dr Myra Gianotti;  s/p angioplasty and stenting bilaterally 2015 and 11/ 2021 (per lov note 06-08-2021 bilateral iliofemoral intervention's are widely patent)   CAD (coronary artery disease)    cardiologist-- dr Gala Romney;   s/p stent placed 06/02/2000 in [place];  s/p cath with patent LCx , PCI w/ DES to pLAD and PCI to POBA of D1   Chronic diastolic (congestive) heart failure (HCC)    followed by cardiology   COPD (chronic obstructive pulmonary disease) with chronic bronchitis [facility])    pulmonology-- dr Craige Cotta--  (09-23-2021  per pt checks O2 stats at home, avereage 96-97% on RA)   Edema of left lower extremity    per pt since vasuclar surgery 11/ 2021, but told mild   History of COVID-19 06/2021   per pt mild symtpoms that resolved   Hyperlipidemia    Hypertension    followed by cardiology and pcp   OA (osteoarthritis)    OSA on CPAP    followed by dr Craige Cotta--  study in epic 08-23-2012 severe osa (no additional oxygen)   PAD (peripheral artery disease) (HCC)    Peripheral neuropathy    feet   Peripheral vascular disease (HCC)    Rosacea    S/P drug eluting coronary stent placement    2001  x1 stent (unsure if DES);  05/ 2020 DES to pLAD   Shortness of breath    with exertion with stairs but recovers quickly,  ok w/ household chores   Type 2 diabetes mellitus (HCC)    followed by pcp   (09-23-2021 per pt check blood sugar at home twice weekly in am,  fasting sugar-- 170)   Past Surgical History:  Past Surgical History:  Procedure Laterality Date   ABDOMINAL AORTAGRAM N/A 11/29/2012   Procedure: ABDOMINAL Ronny Flurry;  Surgeon: Nada Libman, MD;  Location: [facility] CATH LAB;  Service: Cardiovascular;  Laterality: N/A;   ABDOMINAL AORTAGRAM N/A 09/03/2014   Procedure:  ABDOMINAL AORTAGRAM;  Surgeon: Nada Libman, MD;  Location: [facility] CATH LAB;  Service: Cardiovascular;  Laterality: N/A;   ABDOMINAL AORTOGRAM W/LOWER EXTREMITY N/A 09/16/2020   Procedure: ABDOMINAL AORTOGRAM W/LOWER EXTREMITY;  Surgeon: Nada Libman, MD;  Location: [facility] INVASIVE CV LAB;  Service: Cardiovascular;  Laterality: N/A;   ANTERIOR CERVICAL DECOMP/DISCECTOMY FUSION  10/2001   C5--C7   ANTERIOR LATERAL LUMBAR FUSION 4 LEVELS Right  05/28/2014   Procedure: Right L4-5 L3-4 L2-3, L1-2  Anterior lateral lumbar fusion with percutaneaous pedicle screws. Lumbar four/five, three/four, two/three and possible two/one;  Surgeon: Maeola Harman, MD;  Location: [facility] NEURO ORS;  Service: Neurosurgery;  Laterality: Right;  Lumbar One-Five Fusion with Percutaneous Screws   BUNIONECTOMY Right    1991 and 1994   CARDIAC CATHETERIZATION  03/26/1999   in [place];  per pt medically managed , no intervention   CARPAL TUNNEL RELEASE Right 09/30/2006   CATARACT EXTRACTION W/ INTRAOCULAR LENS IMPLANT Bilateral 2014   COLONOSCOPY  08/26/2020   CORONARY ANGIOPLASTY WITH STENT PLACEMENT  06/02/2000   in [place]; x1 stent   CORONARY STENT INTERVENTION N/A 05/11/2019   Procedure: CORONARY STENT INTERVENTION;  Surgeon: Kathleene Hazel, MD;  Location: [facility] INVAS[facility] CV LAB;  Service: Cardiovascular;  Laterality: N/A;   ENDARTERECTOMY FEMORAL Right 10/10/2014   Procedure: RIGHT FEMORAL ARTERY ENDARTERECTOMY  WITH VASCU GUARD PATCH ANGIOPLASTY;  Surgeon: Nada Libman, MD;  Location: [facility] OR;  Service: Vascular;  Laterality: Right;   ENDARTERECTOMY FEMORAL Left 11/05/2020   Procedure: LEFT FEMORAL ENDARTERECTOMY WITH PATCH ANGIOPLASTY;  Surgeon: Nada Libman, MD;  Location: [facility] OR;  Service: Vascular;  Laterality: Left;   FEMORAL ENDARTERECTOMY Left 11/05/2020   LEFT FEMORAL ENDARTERECTOMY WITH PATCH ANGIOPLASTY (Left    FINGER ARTHRODESIS Right 09/2009   right thumb   HAMMER TOE SURGERY Left    06/ 2007 and 08/ 2008   HYDRADENITIS EXCISION N/A 09/25/2021   Procedure: EXCISION HIDRADENITIS, INTERROGATION OF PERINEAL WOUND;  Surgeon: Andria Meuse, MD;  Location: [facility];  Service: General;  Laterality: N/A;   INSERTION OF ILIAC STENT Left 11/05/2020    INSERTION OF ILIAC STENT (Left )   INSERTION OF ILIAC STENT Left 11/05/2020   Procedure: INSERTION OF ILIAC STENT;  Surgeon: Nada Libman, MD;  Location: [facility] OR;   Service: Vascular;  Laterality: Left;   LUMBAR PERCUTANEOUS PEDICLE SCREW 4 LEVEL N/A 05/28/2014   Procedure: LUMBAR PERCUTANEOUS PEDICLE SCREW 4 LEVEL;  Surgeon: Maeola Harman, MD;  Location: [facility];  Service: Neurosurgery;  Laterality: N/A;   LUMBAR SPINE SURGERY     12/ 1997 and 04/ 2005   NEUROMA SURGERY Right 1996   right foot   PERIPHERAL INTRAVASCULAR LITHOTRIPSY Left 11/05/2020   Procedure: INTRAVASCULAR LITHOTRIPSY;  Surgeon: Nada Libman, MD;  [facility] OR;  Service: Vascular;  Laterality: Left;  shockwave   PERIPHERAL VASCULAR CATHETERIZATION N/A 12/07/2016   Procedure: Abdominal Aortogram w/Lower Extremity;  Surgeon: Nada Libman, MD;  Location: [facility] CV LAB;  Service: Cardiovascular;  Laterality: N/A;   RECTAL EXAM UNDER ANESTHESIA N/A 09/25/2021   Procedure: ANORECTAL EXAM UNDER ANESTHESIA;  Surgeon: Andria Meuse, MD;  Location: [facility] [place];  Service: General;  Laterality: N/A;   RIGHT/LEFT HEART CATH AND CORONARY ANGIOGRAPHY N/A 05/11/2019   Procedure: RIGHT/LEFT HEART CATH AND CORONARY ANGIOGRAPHY;  Surgeon: Dolores Patty, MD;  Location: [facility] INVAS[facility] CV LAB;  Service: Cardiovascular;  Laterality: N/A;   TONSILLECTOMY  1965   HPI:  Anthony [name] is a 77 y.o. male who presented with sudden onset of slurred speech and transient altered mental status. MRI revealed 1 cm acute ischemic nonhemorrhagic left thalamic lacunar infarct. PMHx:  coronary artery disease, peripheral vascular disease, COPD, essential hypertension, obstructive sleep apnea, osteoarthritis   Assessment / Plan / Recommendation Clinical Impression  Anthony [name] participated in aphasia assessment.  He and his wife describe occasional word-finding issues that have been present for some time but otherwise, communication is back to baseline. Pt was fluent with clear speech; no dysarthria.  Comprehension was WNL excluding mild difficulty with multi-step commands.   Confrontational, responsive and divergent naming were [facility] Pt was able to sequence steps to complete tasks (e.g., making scrambled eggs, changing tire) with no evidence of aphasia.   Reading WNL.  Attention/recall WFL. No aphasia identified. We reviewed BE-FAST acronym and importance of being vigilant to s/s of stroke in the future. No further SLP needs identified. Our service will sign off.    SLP Assessment  SLP Recommendation/Assessment: Patient does not need any further Speech Lanaguage Pathology Services SLP Visit Diagnosis: Aphasia (R47.01)    Recommendations for follow up therapy are one component of a multi-disciplinary discharge planning process, led by the attending physician.  Recommendations may be updated based on patient status, additional functional criteria and insurance authorization.    Follow Up Recommendations  No SLP follow up    Assistance Recommended at Discharge  None                 SLP Evaluation Cognition  Overall Cognitive Status: Within Functional Limits for tasks assessed Arousal/Alertness: Awake/alert Orientation Level: Oriented X4 Attention: Selective Selective Attention: Appears intact Memory: Appears intact Awareness: Appears intact       Comprehension  Auditory Comprehension Overall Auditory Comprehension: Appears within functional limits for tasks assessed Yes/No Questions: Within Functional Limits Conversation: Complex Visual Recognition/Discrimination Discrimination: Within Function Limits Reading Comprehension Reading Status: Within funtional limits    Expression Expression Primary Mode of Expression: Verbal Verbal Expression Overall Verbal Expression: Appears within functional limits for tasks assessed Level of Generative/Spontaneous Verbalization: Conversation Repetition: No impairment Naming: No impairment Pragmatics: No impairment Written Expression Dominant Hand: Right   Oral / Motor  Oral Motor/Sensory Function Overall  Oral Motor/Sensory Function: Within functional limits Motor Speech Overall Motor Speech: Appears within functional limits for tasks assessed            Blenda Mounts Laurice 10/05/2023, 9:40 AM Marchelle Folks L. Samson Frederic, MA CCC/SLP Clinical Specialist - Acute Care SLP Acute Rehabilitation Services Office number [phone]

## 2023-10-06 DIAGNOSIS — R2990 Unspecified symptoms and signs involving the nervous system: Secondary | ICD-10-CM | POA: Diagnosis not present

## 2023-10-06 LAB — GLUCOSE, CAPILLARY: Glucose-Capillary: 184 mg/dL — ABNORMAL HIGH (ref 70–99)

## 2023-10-06 LAB — BASIC METABOLIC PANEL
Anion gap: 9 (ref 5–15)
BUN: 29 mg/dL — ABNORMAL HIGH (ref 8–23)
CO2: 22 mmol/L (ref 22–32)
Calcium: 9.1 mg/dL (ref 8.9–10.3)
Chloride: 107 mmol/L (ref 98–111)
Creatinine, Ser: 1.22 mg/dL (ref 0.61–1.24)
GFR, Estimated: >60 mL/min (ref >60)
Glucose, Bld: 165 mg/dL — ABNORMAL HIGH (ref 70–99)
Potassium: 4.3 mmol/L (ref 3.5–5.1)
Sodium: 138 mmol/L (ref 135–145)

## 2023-10-06 LAB — CBC
HCT: 44.5 % (ref 39.0–52.0)
Hemoglobin: 14.5 g/dL (ref 13.0–17.0)
MCH: 29.8 pg (ref 26.0–34.0)
MCHC: 32.6 g/dL (ref 30.0–36.0)
MCV: 91.6 fL (ref 80.0–100.0)
Platelets: 140 10*3/uL — ABNORMAL LOW (ref 150–400)
RBC: 4.86 MIL/uL (ref 4.22–5.81)
RDW: 13.1 % (ref 11.5–15.5)
WBC: 5.6 10*3/uL (ref 4.0–10.5)
nRBC: 0 % (ref 0.0–0.2)

## 2023-10-06 LAB — MAGNESIUM: Magnesium: 2.3 mg/dL (ref 1.7–2.4)

## 2023-10-06 LAB — PHOSPHORUS: Phosphorus: 4.2 mg/dL (ref 2.5–4.6)

## 2023-10-06 MED ORDER — CLOPIDOGREL BISULFATE 75 MG PO TABS: 75.0000 mg | ORAL_TABLET | Freq: Every day | ORAL | 0 refills | Status: AC

## 2023-10-06 MED ORDER — ORAL CARE MOUTH RINSE: 15.0000 mL | OROMUCOSAL | Status: DC | PRN

## 2023-10-06 NOTE — Plan of Care (Signed)
Patient discharging to home.

## 2023-10-06 NOTE — Plan of Care (Signed)

## 2023-10-06 NOTE — Discharge Summary (Signed)
PSV cm/sEDV cm/sDescribeArm Pressure (mmHG) +----------+--------+--------+--------+-------------------+ [id number]                      126                 +----------+--------+--------+--------+-------------------+  +---------+--------+--+--------+-+---------+ VertebralPSV cm/s26EDV cm/s9Antegrade +---------+--------+--+--------+-+---------+   Summary: Right Carotid: Velocities in the right ICA are consistent with a 60-79%                stenosis. Left Carotid: Velocities in the left ICA are consistent with a 1-39% stenosis. Vertebrals: Bilateral vertebral arteries demonstrate antegrade flow. *See table(s) above for measurements and observations.  Electronically signed by Coral Else MD on 10/03/2023 at 3:41:45 PM.    Final    VAS [place] ABI WITH/WO TBI  Result Date: 10/03/2023  LOWER EXTREMITY DOPPLER STUDY Patient Name:  Anthony [name]  Date of Exam:   10/03/2023 Medical Rec #: [id number]        Accession #:    [id number] Date of Birth: [birth year]        Patient Gender: M Patient Age:   77 years Exam Location:  [facility] Procedure:      VAS [place] ABI WITH/WO TBI Referring Phys: Coral Else --------------------------------------------------------------------------------  Indications: Peripheral artery disease. High Risk Factors: Hypertension, hyperlipidemia, coronary artery disease. Other Factors: Patient reports no lower.  Vascular Interventions: 11/05/2020                         Extensive endarterectomy of the left external iliac,                         common femoral, superficial femoral, and profundofemoral                         artery                         Bovine pericardial patch angioplasty of the left common                         femoral and profundofemoral artery                         Separate bovine pericardial patch angioplasty of the                         right superficial femoral artery                         Arterial lithotripsy of the left common and left                         external iliac artery                         Stent, left common and left external iliac artery. Comparison Study: 03/22/23 prior Performing Technologist: [place] Ponder RVS  Examination Guidelines:  A complete evaluation includes at minimum, Doppler waveform signals and systolic blood pressure reading at the level of bilateral brachial, anterior tibial, and posterior tibial arteries, when vessel segments are accessible. Bilateral testing is considered an integral part of a complete examination. Photoelectric Plethysmograph (PPG) waveforms and toe systolic pressure readings are  daily.   PROBIOTIC PO Take 1 capsule by mouth every evening.   rOPINIRole 1 MG tablet Commonly known as: REQUIP Take 1 mg by mouth every evening.   Semglee (yfgn) 100 UNIT/ML Pen Generic drug: insulin glargine-yfgn Inject 15 Units into the skin every evening.   simvastatin 40 MG tablet Commonly known as: ZOCOR Take 40 mg by mouth every evening.        Follow-up Information     Creola Corn, MD Follow up in 1 week(s).   Specialty: Internal Medicine Contact information: [address] [name] [address] [phone]         [facility] Follow up in 8 week(s).   Contact information: [address] [place] [address] [phone]                Allergies  Allergen Reactions   Spiriva Respimat [Tiotropium Bromide Monohydrate] Other (See Comments)    Throat irritation    Consultations: Neurology   Procedures/Studies: ECHOCARDIOGRAM COMPLETE  Result Date: 10/05/2023     ECHOCARDIOGRAM REPORT   Patient Name:   Anthony [name] Date of Exam: 10/05/2023 Medical Rec #:  [id number]       Height:       68.0 in Accession #:    [id number]      Weight:       240.0 lb Date of Birth:  02/26/1946       BSA:          2.208 m Patient Age:    77 years        BP:           148/74 mmHg Patient Gender: M               HR:           69 bpm. Exam Location:  Inpatient Procedure: 2D Echo, Cardiac Doppler and Color Doppler Indications:    Stroke I63.9  History:        Patient has prior history of Echocardiogram examinations, most                 recent 02/02/2023. CAD, COPD; Risk Factors:Sleep Apnea, Former                 Smoker, Hypertension, Dyslipidemia and Diabetes.  Sonographer:    Dondra Prader RVT RCS Referring Phys: [id number] MOHAMMAD L GARBA IMPRESSIONS  1. Left ventricular ejection fraction, by estimation, is 55 to 60%. The left ventricle has normal function. The left ventricle has no regional wall motion abnormalities. There is mild concentric left ventricular hypertrophy. Left ventricular diastolic parameters were normal.  2. Right ventricular systolic function is normal. The right ventricular size is normal. Tricuspid regurgitation signal is inadequate for assessing PA pressure.  3. Left atrial size was mildly dilated.  4. The mitral valve is normal in structure. Trivial mitral valve regurgitation. No evidence of mitral stenosis. Moderate mitral annular calcification.  5. The aortic valve is tricuspid. There is mild calcification of the aortic valve. Aortic valve regurgitation is trivial. No aortic stenosis is present.  6. The inferior vena cava is normal in size with greater than 50% respiratory variability, suggesting right atrial pressure of 3 mmHg. Comparison(s): No significant change from prior study. Conclusion(s)/Recommendation(s): No intracardiac source of embolism detected on this transthoracic study. Consider a transesophageal echocardiogram to exclude cardiac source of embolism if clinically  indicated. FINDINGS  Left Ventricle: Left ventricular ejection fraction, by estimation, is 55 to 60%.  Physician Discharge Summary  [name] OZH:[id number] DOB: 25-Aug-1946 DOA: 10/04/2023  PCP: Creola Corn, MD  Admit date: 10/04/2023  Discharge date: 10/06/2023  Admitted From: Home.  Disposition:  Home.  Recommendations for Outpatient Follow-up:  Follow up with PCP in 1-2 weeks. Please obtain BMP/CBC in one week. Advised to take aspirin and Plavix for 3 months followed by Plavix only. Advised to follow-up with [facility] in 8 weeks.  Home Health:Anthony [name] Equipment/Devices:Anthony [name]  Discharge Condition: Stable CODE STATUS:Full code Diet recommendation: Heart Healthy  Brief /Hospital Course:  This 76 yrs old male with PMH significant of coronary artery disease, peripheral vascular disease, COPD, essential hypertension, obstructive sleep apnea, osteoarthritis, history of cardiac stents who was apparently playing cards when he suddenly started having slurred speech, transient altered mental status, and had strokelike symptoms.  Patient was taken to the ED where he was evaluated.  Initial head CT without contrast was negative.  Symptoms appear to have improved.  Urine drug screen is positive for THC,  alcohol level less than 10.  Patient appears to have had strokelike symptoms.  Neurology was consulted.  Recommendation was to admit for CVA workup.  Patient has received aspirin and is being admitted to the hospital for further evaluation and treatment.  MRI shows 1 cm acute ischemic left thalamic lacunar infarct.  Echocardiogram showed LVEF 55 to 60% no RWMA.  Neurology signed off.  Patient is being discharged on aspirin and Plavix for 3 months followed by Plavix only therapy.  Patient has participated in physical therapy recommended outpatient PT.  Patient being discharged home.   Discharge Diagnoses:  Principal Problem:   Stroke-like symptom Active Problems:   COPD (chronic obstructive pulmonary disease) (HCC)   OSA (obstructive sleep apnea)   Morbid obesity with BMI of 40.0-44.9,  adult (HCC)   Coronary atherosclerosis of native coronary artery   PAD (peripheral artery disease) (HCC)   HTN (hypertension)   Type 2 diabetes mellitus (HCC)  Acute CVA: Patient with significant risk factors for coronary artery disease including hypertension, hyperlipidemia and known history of coronary artery disease.   He presented with sudden onset of slurred speech and transient altered mental status and strokelike symptoms which improved. MRI brain shows 1 cm acute ischemic nonhemorrhagic left thalamic lacunar infarct.   The CTA is not very clear.  Carotid duplex is right ICA 60 to 79% blockage, left ICA 39 to 40% blockage Neurology recommended dual antiplatelet therapy as well as statin.   Patient has mild thrombocytopenia so we will be careful with aspirin and Plavix. Patient had participated with PT and OT recommended outpatient PT.  Patient being discharged home.   Essential hypertension:  Continue home regimen.  Avoid hypotension   Non insulin-dependent diabetes:  Initiate sliding scale insulin and hold oral hypoglycemics.   Peripheral arterial disease: Continue home regimen.   Morbid obesity:  continue with dietary counseling   COPD: No acute exacerbation   obstructive sleep apnea: CPAP at night   Coronary artery disease: Stable at baseline.    Discharge Instructions  Discharge Instructions     Call MD for:  difficulty breathing, headache or visual disturbances   Complete by: As directed    Call MD for:  persistant dizziness or light-headedness   Complete by: As directed    Call MD for:  persistant nausea and vomiting   Complete by: As directed    Diet - low sodium heart healthy   Complete by: As directed    Diet Carb Modified  age related volume loss. 2. Aspects is 10. These results were called by telephone at the time of interpretation on 10/04/2023 at 3:37 pm to provider [facility] , who verbally acknowledged these results. Electronically Signed   By: Paulina Fusi M.D.   On: 10/04/2023 15:38   VAS US AORTA/IVC/ILIACS  Result Date: 10/03/2023 ABDOMINAL AORTA STUDY Patient Name:  Anthony [name]  Date of Exam:   10/03/2023 Medical Rec #: [id number]        Accession #:    [id number] Date of Birth: 1946/06/25        Patient Gender: M Patient Age:   14 years Exam Location:  [facility] Procedure:      VAS US AORTA/IVC/ILIACS Referring Phys: Coral Else --------------------------------------------------------------------------------  Indications: Left CIA / EIA stent evaluation Risk Factors: Hypertension, hyperlipidemia, coronary artery disease. Vascular Interventions: 11/05/2020                         Extensive endarterectomy of the left external iliac,                         common femoral, superficial femoral, and profundofemoral                         artery                         Bovine pericardial patch angioplasty of the left common                         femoral and profundofemoral artery                         Separate bovine pericardial patch angioplasty of the                         right superficial femoral artery                         Arterial lithotripsy of the left common and left                         external iliac artery                         Stent, left common and left external iliac artery. Limitations: Air/bowel gas.  Comparison Study: 03/22/23 prior Performing Technologist: [place] Ponder RVS  Examination Guidelines: A complete  evaluation includes B-mode imaging, spectral Doppler, color Doppler, and power Doppler as needed of all accessible portions of each vessel. Bilateral testing is considered an integral part of a complete examination. Limited examinations for reoccurring indications may be performed as noted.  Abdominal Aorta Findings: +-------------+------+----------+---------+--------+--------+------------------+ Location     AP    Trans (cm)PSV      WaveformThrombusComments                        (cm)            (cm/s)                                      +-------------+------+----------+---------+--------+--------+------------------+  78       biphasic                           +-------------+------+----------+---------+--------+--------+------------------+ LT EIA Mid                   193      biphasic                           +-------------+------+----------+---------+--------+--------+------------------+ LT EIA Distal                143      biphasic                           +-------------+------+----------+---------+--------+--------+------------------+ Left Stent(s): +---------------+--------+--------+--------+--------+ CIA / EIA      PSV cm/sStenosisWaveformComments +---------------+--------+--------+--------+--------+ Prox to Stent  90              biphasic         +---------------+--------+--------+--------+--------+ Proximal Stent 126             biphasic         +---------------+--------+--------+--------+--------+ Mid Stent      160             biphasic         +---------------+--------+--------+--------+--------+ Distal Stent   185             biphasic         +---------------+--------+--------+--------+--------+ Distal to Stent197             biphasic         +---------------+--------+--------+--------+--------+ Unable to replicate increased velocties from prior exam.   Summary: Stenosis: +------------------+-------------+ Location          Stenosis      +------------------+-------------+ Right Common Iliac>50% stenosis +------------------+-------------+ Patent left CIA / EIA stent without obvious evidence of stenosis.  *See table(s) above for measurements and observations.  Electronically signed by Coral Else MD on 10/03/2023 at 3:42:10 PM.    Final    VAS US CAROTID  Result Date:  10/03/2023 Carotid Arterial Duplex Study Patient Name:  Anthony [name]  Date of Exam:   10/03/2023 Medical Rec #: [id number]        Accession #:    [id number] Date of Birth: [birth year]        Patient Gender: M Patient Age:   21 years Exam Location:  [facility] Procedure:      VAS US CAROTID Referring Phys: Coral Else --------------------------------------------------------------------------------  Indications:       Carotid artery disease. Risk Factors:      Hypertension, hyperlipidemia, coronary artery disease. Comparison Study:  03/22/23 prior Performing Technologist: [place] Ponder RVS  Examination Guidelines: A complete evaluation includes B-mode imaging, spectral Doppler, color Doppler, and power Doppler as needed of all accessible portions of each vessel. Bilateral testing is considered an integral part of a complete examination. Limited examinations for reoccurring indications may be performed as noted.  Right Carotid Findings: +----------+--------+--------+--------+-------------------------+--------+           PSV cm/sEDV cm/sStenosisPlaque Description       Comments +----------+--------+--------+--------+-------------------------+--------+ CCA Prox  51      17              heterogenous                      +----------+--------+--------+--------+-------------------------+--------+ CCA  daily.   PROBIOTIC PO Take 1 capsule by mouth every evening.   rOPINIRole 1 MG tablet Commonly known as: REQUIP Take 1 mg by mouth every evening.   Semglee (yfgn) 100 UNIT/ML Pen Generic drug: insulin glargine-yfgn Inject 15 Units into the skin every evening.   simvastatin 40 MG tablet Commonly known as: ZOCOR Take 40 mg by mouth every evening.        Follow-up Information     Creola Corn, MD Follow up in 1 week(s).   Specialty: Internal Medicine Contact information: [address] [name] [address] [phone]         [facility] Follow up in 8 week(s).   Contact information: [address] [place] [address] [phone]                Allergies  Allergen Reactions   Spiriva Respimat [Tiotropium Bromide Monohydrate] Other (See Comments)    Throat irritation    Consultations: Neurology   Procedures/Studies: ECHOCARDIOGRAM COMPLETE  Result Date: 10/05/2023     ECHOCARDIOGRAM REPORT   Patient Name:   Anthony [name] Date of Exam: 10/05/2023 Medical Rec #:  [id number]       Height:       68.0 in Accession #:    [id number]      Weight:       240.0 lb Date of Birth:  02/26/1946       BSA:          2.208 m Patient Age:    77 years        BP:           148/74 mmHg Patient Gender: M               HR:           69 bpm. Exam Location:  Inpatient Procedure: 2D Echo, Cardiac Doppler and Color Doppler Indications:    Stroke I63.9  History:        Patient has prior history of Echocardiogram examinations, most                 recent 02/02/2023. CAD, COPD; Risk Factors:Sleep Apnea, Former                 Smoker, Hypertension, Dyslipidemia and Diabetes.  Sonographer:    Dondra Prader RVT RCS Referring Phys: [id number] MOHAMMAD L GARBA IMPRESSIONS  1. Left ventricular ejection fraction, by estimation, is 55 to 60%. The left ventricle has normal function. The left ventricle has no regional wall motion abnormalities. There is mild concentric left ventricular hypertrophy. Left ventricular diastolic parameters were normal.  2. Right ventricular systolic function is normal. The right ventricular size is normal. Tricuspid regurgitation signal is inadequate for assessing PA pressure.  3. Left atrial size was mildly dilated.  4. The mitral valve is normal in structure. Trivial mitral valve regurgitation. No evidence of mitral stenosis. Moderate mitral annular calcification.  5. The aortic valve is tricuspid. There is mild calcification of the aortic valve. Aortic valve regurgitation is trivial. No aortic stenosis is present.  6. The inferior vena cava is normal in size with greater than 50% respiratory variability, suggesting right atrial pressure of 3 mmHg. Comparison(s): No significant change from prior study. Conclusion(s)/Recommendation(s): No intracardiac source of embolism detected on this transthoracic study. Consider a transesophageal echocardiogram to exclude cardiac source of embolism if clinically  indicated. FINDINGS  Left Ventricle: Left ventricular ejection fraction, by estimation, is 55 to 60%.  PSV cm/sEDV cm/sDescribeArm Pressure (mmHG) +----------+--------+--------+--------+-------------------+ [id number]                      126                 +----------+--------+--------+--------+-------------------+  +---------+--------+--+--------+-+---------+ VertebralPSV cm/s26EDV cm/s9Antegrade +---------+--------+--+--------+-+---------+   Summary: Right Carotid: Velocities in the right ICA are consistent with a 60-79%                stenosis. Left Carotid: Velocities in the left ICA are consistent with a 1-39% stenosis. Vertebrals: Bilateral vertebral arteries demonstrate antegrade flow. *See table(s) above for measurements and observations.  Electronically signed by Coral Else MD on 10/03/2023 at 3:41:45 PM.    Final    VAS [place] ABI WITH/WO TBI  Result Date: 10/03/2023  LOWER EXTREMITY DOPPLER STUDY Patient Name:  Anthony [name]  Date of Exam:   10/03/2023 Medical Rec #: [id number]        Accession #:    [id number] Date of Birth: [birth year]        Patient Gender: M Patient Age:   77 years Exam Location:  [facility] Procedure:      VAS [place] ABI WITH/WO TBI Referring Phys: Coral Else --------------------------------------------------------------------------------  Indications: Peripheral artery disease. High Risk Factors: Hypertension, hyperlipidemia, coronary artery disease. Other Factors: Patient reports no lower.  Vascular Interventions: 11/05/2020                         Extensive endarterectomy of the left external iliac,                         common femoral, superficial femoral, and profundofemoral                         artery                         Bovine pericardial patch angioplasty of the left common                         femoral and profundofemoral artery                         Separate bovine pericardial patch angioplasty of the                         right superficial femoral artery                         Arterial lithotripsy of the left common and left                         external iliac artery                         Stent, left common and left external iliac artery. Comparison Study: 03/22/23 prior Performing Technologist: [place] Ponder RVS  Examination Guidelines:  A complete evaluation includes at minimum, Doppler waveform signals and systolic blood pressure reading at the level of bilateral brachial, anterior tibial, and posterior tibial arteries, when vessel segments are accessible. Bilateral testing is considered an integral part of a complete examination. Photoelectric Plethysmograph (PPG) waveforms and toe systolic pressure readings are  age related volume loss. 2. Aspects is 10. These results were called by telephone at the time of interpretation on 10/04/2023 at 3:37 pm to provider [facility] , who verbally acknowledged these results. Electronically Signed   By: Paulina Fusi M.D.   On: 10/04/2023 15:38   VAS US AORTA/IVC/ILIACS  Result Date: 10/03/2023 ABDOMINAL AORTA STUDY Patient Name:  Anthony [name]  Date of Exam:   10/03/2023 Medical Rec #: [id number]        Accession #:    [id number] Date of Birth: 1946/06/25        Patient Gender: M Patient Age:   14 years Exam Location:  [facility] Procedure:      VAS US AORTA/IVC/ILIACS Referring Phys: Coral Else --------------------------------------------------------------------------------  Indications: Left CIA / EIA stent evaluation Risk Factors: Hypertension, hyperlipidemia, coronary artery disease. Vascular Interventions: 11/05/2020                         Extensive endarterectomy of the left external iliac,                         common femoral, superficial femoral, and profundofemoral                         artery                         Bovine pericardial patch angioplasty of the left common                         femoral and profundofemoral artery                         Separate bovine pericardial patch angioplasty of the                         right superficial femoral artery                         Arterial lithotripsy of the left common and left                         external iliac artery                         Stent, left common and left external iliac artery. Limitations: Air/bowel gas.  Comparison Study: 03/22/23 prior Performing Technologist: [place] Ponder RVS  Examination Guidelines: A complete  evaluation includes B-mode imaging, spectral Doppler, color Doppler, and power Doppler as needed of all accessible portions of each vessel. Bilateral testing is considered an integral part of a complete examination. Limited examinations for reoccurring indications may be performed as noted.  Abdominal Aorta Findings: +-------------+------+----------+---------+--------+--------+------------------+ Location     AP    Trans (cm)PSV      WaveformThrombusComments                        (cm)            (cm/s)                                      +-------------+------+----------+---------+--------+--------+------------------+  PSV cm/sEDV cm/sDescribeArm Pressure (mmHG) +----------+--------+--------+--------+-------------------+ [id number]                      126                 +----------+--------+--------+--------+-------------------+  +---------+--------+--+--------+-+---------+ VertebralPSV cm/s26EDV cm/s9Antegrade +---------+--------+--+--------+-+---------+   Summary: Right Carotid: Velocities in the right ICA are consistent with a 60-79%                stenosis. Left Carotid: Velocities in the left ICA are consistent with a 1-39% stenosis. Vertebrals: Bilateral vertebral arteries demonstrate antegrade flow. *See table(s) above for measurements and observations.  Electronically signed by Coral Else MD on 10/03/2023 at 3:41:45 PM.    Final    VAS [place] ABI WITH/WO TBI  Result Date: 10/03/2023  LOWER EXTREMITY DOPPLER STUDY Patient Name:  Anthony [name]  Date of Exam:   10/03/2023 Medical Rec #: [id number]        Accession #:    [id number] Date of Birth: [birth year]        Patient Gender: M Patient Age:   77 years Exam Location:  [facility] Procedure:      VAS [place] ABI WITH/WO TBI Referring Phys: Coral Else --------------------------------------------------------------------------------  Indications: Peripheral artery disease. High Risk Factors: Hypertension, hyperlipidemia, coronary artery disease. Other Factors: Patient reports no lower.  Vascular Interventions: 11/05/2020                         Extensive endarterectomy of the left external iliac,                         common femoral, superficial femoral, and profundofemoral                         artery                         Bovine pericardial patch angioplasty of the left common                         femoral and profundofemoral artery                         Separate bovine pericardial patch angioplasty of the                         right superficial femoral artery                         Arterial lithotripsy of the left common and left                         external iliac artery                         Stent, left common and left external iliac artery. Comparison Study: 03/22/23 prior Performing Technologist: [place] Ponder RVS  Examination Guidelines:  A complete evaluation includes at minimum, Doppler waveform signals and systolic blood pressure reading at the level of bilateral brachial, anterior tibial, and posterior tibial arteries, when vessel segments are accessible. Bilateral testing is considered an integral part of a complete examination. Photoelectric Plethysmograph (PPG) waveforms and toe systolic pressure readings are  daily.   PROBIOTIC PO Take 1 capsule by mouth every evening.   rOPINIRole 1 MG tablet Commonly known as: REQUIP Take 1 mg by mouth every evening.   Semglee (yfgn) 100 UNIT/ML Pen Generic drug: insulin glargine-yfgn Inject 15 Units into the skin every evening.   simvastatin 40 MG tablet Commonly known as: ZOCOR Take 40 mg by mouth every evening.        Follow-up Information     Creola Corn, MD Follow up in 1 week(s).   Specialty: Internal Medicine Contact information: [address] [name] [address] [phone]         [facility] Follow up in 8 week(s).   Contact information: [address] [place] [address] [phone]                Allergies  Allergen Reactions   Spiriva Respimat [Tiotropium Bromide Monohydrate] Other (See Comments)    Throat irritation    Consultations: Neurology   Procedures/Studies: ECHOCARDIOGRAM COMPLETE  Result Date: 10/05/2023     ECHOCARDIOGRAM REPORT   Patient Name:   Anthony [name] Date of Exam: 10/05/2023 Medical Rec #:  [id number]       Height:       68.0 in Accession #:    [id number]      Weight:       240.0 lb Date of Birth:  02/26/1946       BSA:          2.208 m Patient Age:    77 years        BP:           148/74 mmHg Patient Gender: M               HR:           69 bpm. Exam Location:  Inpatient Procedure: 2D Echo, Cardiac Doppler and Color Doppler Indications:    Stroke I63.9  History:        Patient has prior history of Echocardiogram examinations, most                 recent 02/02/2023. CAD, COPD; Risk Factors:Sleep Apnea, Former                 Smoker, Hypertension, Dyslipidemia and Diabetes.  Sonographer:    Dondra Prader RVT RCS Referring Phys: [id number] MOHAMMAD L GARBA IMPRESSIONS  1. Left ventricular ejection fraction, by estimation, is 55 to 60%. The left ventricle has normal function. The left ventricle has no regional wall motion abnormalities. There is mild concentric left ventricular hypertrophy. Left ventricular diastolic parameters were normal.  2. Right ventricular systolic function is normal. The right ventricular size is normal. Tricuspid regurgitation signal is inadequate for assessing PA pressure.  3. Left atrial size was mildly dilated.  4. The mitral valve is normal in structure. Trivial mitral valve regurgitation. No evidence of mitral stenosis. Moderate mitral annular calcification.  5. The aortic valve is tricuspid. There is mild calcification of the aortic valve. Aortic valve regurgitation is trivial. No aortic stenosis is present.  6. The inferior vena cava is normal in size with greater than 50% respiratory variability, suggesting right atrial pressure of 3 mmHg. Comparison(s): No significant change from prior study. Conclusion(s)/Recommendation(s): No intracardiac source of embolism detected on this transthoracic study. Consider a transesophageal echocardiogram to exclude cardiac source of embolism if clinically  indicated. FINDINGS  Left Ventricle: Left ventricular ejection fraction, by estimation, is 55 to 60%.  Physician Discharge Summary  [name] OZH:[id number] DOB: 25-Aug-1946 DOA: 10/04/2023  PCP: Creola Corn, MD  Admit date: 10/04/2023  Discharge date: 10/06/2023  Admitted From: Home.  Disposition:  Home.  Recommendations for Outpatient Follow-up:  Follow up with PCP in 1-2 weeks. Please obtain BMP/CBC in one week. Advised to take aspirin and Plavix for 3 months followed by Plavix only. Advised to follow-up with [facility]rology in 8 weeks.  Home Health:Anthony [name] Equipment/Devices:Anthony [name]  Discharge Condition: Stable CODE STATUS:Full code Diet recommendation: Heart Healthy  Brief /Hospital Course:  This 76 yrs old male with PMH significant of coronary artery disease, peripheral vascular disease, COPD, essential hypertension, obstructive sleep apnea, osteoarthritis, history of cardiac stents who was apparently playing cards when he suddenly started having slurred speech, transient altered mental status, and had strokelike symptoms.  Patient was taken to the ED where he was evaluated.  Initial head CT without contrast was negative.  Symptoms appear to have improved.  Urine drug screen is positive for THC,  alcohol level less than 10.  Patient appears to have had strokelike symptoms.  Neurology was consulted.  Recommendation was to admit for CVA workup.  Patient has received aspirin and is being admitted to the hospital for further evaluation and treatment.  MRI shows 1 cm acute ischemic left thalamic lacunar infarct.  Echocardiogram showed LVEF 55 to 60% no RWMA.  Neurology signed off.  Patient is being discharged on aspirin and Plavix for 3 months followed by Plavix only therapy.  Patient has participated in physical therapy recommended outpatient PT.  Patient being discharged home.   Discharge Diagnoses:  Principal Problem:   Stroke-like symptom Active Problems:   COPD (chronic obstructive pulmonary disease) (HCC)   OSA (obstructive sleep apnea)   Morbid obesity with BMI of 40.0-44.9,  adult (HCC)   Coronary atherosclerosis of native coronary artery   PAD (peripheral artery disease) (HCC)   HTN (hypertension)   Type 2 diabetes mellitus (HCC)  Acute CVA: Patient with significant risk factors for coronary artery disease including hypertension, hyperlipidemia and known history of coronary artery disease.   He presented with sudden onset of slurred speech and transient altered mental status and strokelike symptoms which improved. MRI brain shows 1 cm acute ischemic nonhemorrhagic left thalamic lacunar infarct.   The CTA is not very clear.  Carotid duplex is right ICA 60 to 79% blockage, left ICA 39 to 40% blockage Neurology recommended dual antiplatelet therapy as well as statin.   Patient has mild thrombocytopenia so we will be careful with aspirin and Plavix. Patient had participated with PT and OT recommended outpatient PT.  Patient being discharged home.   Essential hypertension:  Continue home regimen.  Avoid hypotension   Non insulin-dependent diabetes:  Initiate sliding scale insulin and hold oral hypoglycemics.   Peripheral arterial disease: Continue home regimen.   Morbid obesity:  continue with dietary counseling   COPD: No acute exacerbation   obstructive sleep apnea: CPAP at night   Coronary artery disease: Stable at baseline.    Discharge Instructions  Discharge Instructions     Call MD for:  difficulty breathing, headache or visual disturbances   Complete by: As directed    Call MD for:  persistant dizziness or light-headedness   Complete by: As directed    Call MD for:  persistant nausea and vomiting   Complete by: As directed    Diet - low sodium heart healthy   Complete by: As directed    Diet Carb Modified  PSV cm/sEDV cm/sDescribeArm Pressure (mmHG) +----------+--------+--------+--------+-------------------+ [id number]                      126                 +----------+--------+--------+--------+-------------------+  +---------+--------+--+--------+-+---------+ VertebralPSV cm/s26EDV cm/s9Antegrade +---------+--------+--+--------+-+---------+   Summary: Right Carotid: Velocities in the right ICA are consistent with a 60-79%                stenosis. Left Carotid: Velocities in the left ICA are consistent with a 1-39% stenosis. Vertebrals: Bilateral vertebral arteries demonstrate antegrade flow. *See table(s) above for measurements and observations.  Electronically signed by Coral Else MD on 10/03/2023 at 3:41:45 PM.    Final    VAS [place] ABI WITH/WO TBI  Result Date: 10/03/2023  LOWER EXTREMITY DOPPLER STUDY Patient Name:  Anthony [name]  Date of Exam:   10/03/2023 Medical Rec #: [id number]        Accession #:    [id number] Date of Birth: [birth year]        Patient Gender: M Patient Age:   77 years Exam Location:  [facility] Procedure:      VAS [place] ABI WITH/WO TBI Referring Phys: Coral Else --------------------------------------------------------------------------------  Indications: Peripheral artery disease. High Risk Factors: Hypertension, hyperlipidemia, coronary artery disease. Other Factors: Patient reports no lower.  Vascular Interventions: 11/05/2020                         Extensive endarterectomy of the left external iliac,                         common femoral, superficial femoral, and profundofemoral                         artery                         Bovine pericardial patch angioplasty of the left common                         femoral and profundofemoral artery                         Separate bovine pericardial patch angioplasty of the                         right superficial femoral artery                         Arterial lithotripsy of the left common and left                         external iliac artery                         Stent, left common and left external iliac artery. Comparison Study: 03/22/23 prior Performing Technologist: [place] Ponder RVS  Examination Guidelines:  A complete evaluation includes at minimum, Doppler waveform signals and systolic blood pressure reading at the level of bilateral brachial, anterior tibial, and posterior tibial arteries, when vessel segments are accessible. Bilateral testing is considered an integral part of a complete examination. Photoelectric Plethysmograph (PPG) waveforms and toe systolic pressure readings are  age related volume loss. 2. Aspects is 10. These results were called by telephone at the time of interpretation on 10/04/2023 at 3:37 pm to provider [facility] , who verbally acknowledged these results. Electronically Signed   By: Paulina Fusi M.D.   On: 10/04/2023 15:38   VAS US AORTA/IVC/ILIACS  Result Date: 10/03/2023 ABDOMINAL AORTA STUDY Patient Name:  Anthony [name]  Date of Exam:   10/03/2023 Medical Rec #: [id number]        Accession #:    [id number] Date of Birth: 1946/06/25        Patient Gender: M Patient Age:   14 years Exam Location:  [facility] Procedure:      VAS US AORTA/IVC/ILIACS Referring Phys: Coral Else --------------------------------------------------------------------------------  Indications: Left CIA / EIA stent evaluation Risk Factors: Hypertension, hyperlipidemia, coronary artery disease. Vascular Interventions: 11/05/2020                         Extensive endarterectomy of the left external iliac,                         common femoral, superficial femoral, and profundofemoral                         artery                         Bovine pericardial patch angioplasty of the left common                         femoral and profundofemoral artery                         Separate bovine pericardial patch angioplasty of the                         right superficial femoral artery                         Arterial lithotripsy of the left common and left                         external iliac artery                         Stent, left common and left external iliac artery. Limitations: Air/bowel gas.  Comparison Study: 03/22/23 prior Performing Technologist: [place] Ponder RVS  Examination Guidelines: A complete  evaluation includes B-mode imaging, spectral Doppler, color Doppler, and power Doppler as needed of all accessible portions of each vessel. Bilateral testing is considered an integral part of a complete examination. Limited examinations for reoccurring indications may be performed as noted.  Abdominal Aorta Findings: +-------------+------+----------+---------+--------+--------+------------------+ Location     AP    Trans (cm)PSV      WaveformThrombusComments                        (cm)            (cm/s)                                      +-------------+------+----------+---------+--------+--------+------------------+

## 2023-10-06 NOTE — TOC Transition Note (Signed)
Transition of Care [facility]) - CM/SW Discharge Note   Patient Details  Name: Anthony [name] MRN: [id number] Date of Birth: January 28, 1946  Transition [facility] Care [facility] CM/SW Contact:  Kermit Balo, RN Phone Number: 10/06/2023, 10:25 AM   Clinical Narrative:    Patient is discharging home with self care.  No follow up per therapies and no DME needs. No needs per TOC.    Final next level of care: Home/Self Care Barriers to Discharge: No Barriers Identified   Patient Goals and CMS Choice      Discharge Placement                         Discharge Plan and Services Additional resources added to the After Visit Summary for                                       Social Determinants of Health (SDOH) Interventions SDOH Screenings   Food Insecurity: No Food Insecurity (10/05/2023)  Housing: Low Risk  (10/05/2023)  Transportation Needs: No Transportation Needs (10/05/2023)  Utilities: Not At Risk (10/05/2023)  Tobacco Use: Medium Risk (10/04/2023)     Readmission Risk Interventions     No data to display

## 2023-10-06 NOTE — Progress Notes (Signed)
Discharge instructions given. Patient verbalized understanding and all questions were answered.  ?

## 2023-10-06 NOTE — Discharge Instructions (Signed)
Advised to follow-up with primary care physician in 1 week. Advised to take aspirin and Plavix for 3 months followed by Plavix only. Advised to follow-up with [facility] neurology in 8 weeks.

## 2023-10-26 ENCOUNTER — Telehealth (HOSPITAL_COMMUNITY): Payer: Self-pay

## 2023-10-26 NOTE — Telephone Encounter (Signed)
Called patient to see if he is interested in the Pulmonary Rehab Program. Patient expressed interest. Explained scheduling process and went over insurance, patient verbalized understanding.    Will pass to RN for review. 

## 2023-10-26 NOTE — Telephone Encounter (Signed)
[place] the [place] [id number].

## 2023-10-28 ENCOUNTER — Other Ambulatory Visit: Payer: Self-pay

## 2023-10-28 DIAGNOSIS — I6521 Occlusion and stenosis of right carotid artery: Secondary | ICD-10-CM

## 2023-10-28 DIAGNOSIS — I70213 Atherosclerosis of native arteries of extremities with intermittent claudication, bilateral legs: Secondary | ICD-10-CM

## 2023-10-28 DIAGNOSIS — I739 Peripheral vascular disease, unspecified: Secondary | ICD-10-CM

## 2023-10-31 ENCOUNTER — Telehealth (HOSPITAL_COMMUNITY): Payer: Self-pay

## 2023-10-31 NOTE — Telephone Encounter (Signed)
Called patient to see if he was interested in participating in the Pulmonary Rehab Program. Patient stated yes. Patient will come in for orientation on 11/02/23 @ 10:30AM and will attend the 10:15AM exercise class.   Pensions consultant.

## 2023-11-02 ENCOUNTER — Ambulatory Visit (HOSPITAL_COMMUNITY): Payer: Non-veteran care

## 2023-11-02 ENCOUNTER — Encounter (HOSPITAL_COMMUNITY)
Admission: RE | Admit: 2023-11-02 | Discharge: 2023-11-02 | Disposition: A | Discharge status: Home / Self Care | Payer: Medicare Other | Source: Ambulatory Visit | Attending: Pulmonary Disease | Admitting: Pulmonary Disease

## 2023-11-02 ENCOUNTER — Encounter (HOSPITAL_COMMUNITY): Payer: Self-pay

## 2023-11-02 VITALS — BP 110/60 | HR 70 | Ht 68.5 in | Wt 227.3 lb

## 2023-11-02 DIAGNOSIS — G4730 Sleep apnea, unspecified: Secondary | ICD-10-CM | POA: Insufficient documentation

## 2023-11-02 DIAGNOSIS — J449 Chronic obstructive pulmonary disease, unspecified: Secondary | ICD-10-CM | POA: Diagnosis not present

## 2023-11-02 DIAGNOSIS — M545 Low back pain, unspecified: Secondary | ICD-10-CM | POA: Insufficient documentation

## 2023-11-02 DIAGNOSIS — Z5189 Encounter for other specified aftercare: Secondary | ICD-10-CM | POA: Insufficient documentation

## 2023-11-02 DIAGNOSIS — I10 Essential (primary) hypertension: Secondary | ICD-10-CM | POA: Insufficient documentation

## 2023-11-02 DIAGNOSIS — N183 Chronic kidney disease, stage 3 unspecified: Secondary | ICD-10-CM | POA: Insufficient documentation

## 2023-11-02 DIAGNOSIS — E1165 Type 2 diabetes mellitus with hyperglycemia: Secondary | ICD-10-CM | POA: Insufficient documentation

## 2023-11-02 DIAGNOSIS — Z87891 Personal history of nicotine dependence: Secondary | ICD-10-CM | POA: Insufficient documentation

## 2023-11-02 DIAGNOSIS — L409 Psoriasis, unspecified: Secondary | ICD-10-CM | POA: Insufficient documentation

## 2023-11-02 DIAGNOSIS — E1140 Type 2 diabetes mellitus with diabetic neuropathy, unspecified: Secondary | ICD-10-CM | POA: Insufficient documentation

## 2023-11-02 DIAGNOSIS — M720 Palmar fascial fibromatosis [Dupuytren]: Secondary | ICD-10-CM | POA: Insufficient documentation

## 2023-11-02 NOTE — Progress Notes (Signed)
Anthony [name] 77 y.o. male  Pulmonary Rehab Orientation Note  This patient who [facility] the [facility] COPD 2 arrived today in Cardiac and Pulmonary Rehab. He arrived ambulatory with normal gait. He does not carry portable oxygen. Per patient, Summer never uses oxygen. Color good, skin warm and dry. Patient is oriented to time and place. Patient's medical history, psychosocial health, and medications reviewed.   Psychosocial assessment reveals patient lives with spouse. Anthony [name] is currently retired. 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 Patient reports his stress level is low. Areas of stress/anxiety include health. Patient does not exhibit signs of depression. PHQ2/9 score 0/0. Anthony [name] shows good  coping skills with positive outlook on life. Offered emotional support and reassurance. Will continue to monitor and evaluate progress toward psychosocial goal(s) of decreased health concern.  Physical assessment reveals heart rate is normal, breath sounds clear to auscultation, no wheezes, rales, or rhonchi. Grip strength equal, strong. Distal pulses present. Anthony [name] reports he does take medications as prescribed. Patient states he follows a regular  and diabetic diet. Pt is actively trying to lose weight through diet and exercise. Pt's weight will be monitored closely.   Demonstration and practice of PLB using pulse oximeter. Anthony [name] able to return demonstration satisfactorily. Safety and hand hygiene in the exercise area reviewed with patient. Anthony [name] voices understanding of the information reviewed. Department expectations discussed with patient and achievable goals were set. The patient shows enthusiasm about attending the program and we look forward to working with Anthony [name]. Anthony [name] completed a 6 min walk test today and is scheduled to begin exercise on 11/08/23 at 1015.   [phone] Anthony Hart,  RN, BSN

## 2023-11-02 NOTE — Progress Notes (Signed)
Anthony [name] 77 y.o. male  Initial Psychosocial Assessment  Pt psychosocial assessment reveals pt lives with their spouse. Pt is currently retired. 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 Pt reports his  stress level is low. Areas of stress/anxiety include health. Pt does not exhibit signs of depression.  Pt shows good  coping skills with positive outlook. Offered emotional support and reassurance. Will monitor and evaluate progress toward psychosocial goal(s).  Goal(s): Improved management of stress Improved coping skills Help patient work toward returning to meaningful activities that improve patient's QOL and are attainable with patient's lung disease   11/02/2023 12:22 PM

## 2023-11-02 NOTE — Progress Notes (Signed)
Pulmonary Individual Treatment Plan  Patient Details  Name: Anthony [name] MRN: [id number] Date of Birth: November 24, 1946 Referring Provider:   Doristine Devoid Pulmonary Rehab Walk Test from 11/02/2023 in [facility]  Referring Provider Briones       Initial Encounter Date:  Flowsheet Row Pulmonary Rehab Walk Test from 11/02/2023 in [facility]  Date 11/02/23       Visit Diagnosis: Stage 2 moderate COPD by GOLD classification (HCC)  Patient's Home Medications on Admission:   Current Outpatient Medications:    albuterol (VENTOLIN HFA) 108 (90 Base) MCG/ACT inhaler, Inhale 2 puffs into the lungs every 6 (six) hours as needed for wheezing or shortness of breath., Disp: , Rfl:    ASPIRIN LOW DOSE 81 MG EC tablet, Take 81 mg by mouth at bedtime., Disp: , Rfl:    b complex vitamins capsule, Take 1 capsule by mouth daily., Disp: , Rfl:    BENFOTIAMINE PO, Take 1 tablet by mouth 2 (two) times daily., Disp: , Rfl:    bisoprolol (ZEBETA) 5 MG tablet, Take 1 tablet by mouth every evening., Disp: , Rfl:    carboxymethylcellulose 1 % ophthalmic solution, Apply 1 drop to eye 3 (three) times daily., Disp: , Rfl:    CINNAMON PO, Take 1 tablet by mouth daily., Disp: , Rfl:    clopidogrel (PLAVIX) 75 MG tablet, Take 1 tablet (75 mg total) by mouth daily., Disp: 90 tablet, Rfl: 0   doxazosin (CARDURA) 2 MG tablet, Take 2 mg by mouth every evening., Disp: , Rfl:    doxycycline (ADOXA) 50 MG tablet, Take 50 mg by mouth daily at 12 noon., Disp: , Rfl:    empagliflozin (JARDIANCE) 25 MG TABS tablet, Take 12.5 mg by mouth daily., Disp: , Rfl:    fluticasone (FLONASE) 50 MCG/ACT nasal spray, Place 2 sprays into both nostrils at bedtime as needed for allergies or rhinitis. , Disp: , Rfl:    furosemide (LASIX) 20 MG tablet, Take 20 mg by mouth daily at 12 noon. , Disp: , Rfl:    gabapentin  (NEURONTIN) 100 MG capsule, Take 200 mg by mouth every evening., Disp: , Rfl:    GARLIC PO, Take 1 tablet by mouth daily., Disp: , Rfl:    hydrocortisone 2.5 % ointment, Apply 1 Application topically daily as needed (skin irritation on face)., Disp: , Rfl:    insulin glargine-yfgn (SEMGLEE, YFGN,) 100 UNIT/ML Pen, Inject 15 Units into the skin every evening., Disp: , Rfl:    Insulin Pen Needle (PEN NEEDLES) 32G X 4 MM MISC, Inject q daily with Rx of Lantus DxE-11.22 for 90 days, Disp: , Rfl:    isosorbide mononitrate (IMDUR) 30 MG 24 hr tablet, TAKE 1 TABLET BY MOUTH  DAILY, Disp: 90 tablet, Rfl: 3   L-LYSINE PO, Take 1 tablet by mouth daily., Disp: , Rfl:    loratadine (CLARITIN) 10 MG tablet, Take 10 mg by mouth daily., Disp: , Rfl:    losartan (COZAAR) 100 MG tablet, Take 100 mg by mouth daily., Disp: , Rfl:    metFORMIN (GLUCOPHAGE-XR) 500 MG 24 hr tablet, Take 1,000 mg by mouth 2 (two) times daily., Disp: , Rfl:    Multiple Vitamin (MULTIVITAMIN) tablet, Take 1 tablet by mouth daily., Disp: , Rfl:    nitroGLYCERIN (NITROSTAT) 0.4 MG SL tablet, Place 1 tablet (0.4 mg total) under the tongue every 5 (five) minutes  as needed for chest pain., Disp: 90 tablet, Rfl: 3   Omega-3 Fatty Acids (FISH OIL PO), Take 1 capsule by mouth 2 (two) times daily., Disp: , Rfl:    pentoxifylline (TRENTAL) 400 MG CR tablet, TAKE 1 TABLET BY MOUTH  TWICE DAILY, Disp: 180 tablet, Rfl: 3   potassium chloride SA (KLOR-CON M) 20 MEQ tablet, Take 20 mEq by mouth daily., Disp: , Rfl:    Probiotic Product (PROBIOTIC PO), Take 1 capsule by mouth every evening., Disp: , Rfl:    rOPINIRole (REQUIP) 1 MG tablet, Take 1 mg by mouth every evening. , Disp: , Rfl:    Semaglutide, 2 MG/DOSE, (OZEMPIC, 2 MG/DOSE,) 8 MG/3ML SOPN, Inject 2 mg into the skin See admin instructions. Inject 2 mg once a week on Thursdays., Disp: , Rfl:    simvastatin (ZOCOR) 40 MG tablet, Take 40 mg by mouth every evening., Disp: , Rfl:    triamcinolone  ointment (KENALOG) 0.1 %, Apply 1 Application topically 2 (two) times daily., Disp: , Rfl:    umeclidinium-vilanterol (ANORO ELLIPTA) 62.5-25 MCG/INH AEPB, INHALE 1 INHALATION BY  MOUTH INTO THE LUNGS DAILY, Disp: 180 each, Rfl: 3  Past Medical History: Past Medical History:  Diagnosis Date   Atherosclerosis of native artery of both lower extremities with intermittent claudication (HCC)    followed by vascular , dr Myra Gianotti;  s/p angioplasty and stenting bilaterally 2015 and 11/ 2021 (per lov note 06-08-2021 bilateral iliofemoral intervention's are widely patent)   CAD (coronary artery disease)    cardiologist-- dr Gala Romney;  s/p stent placed 06/02/2000 in [place];  s/p cath with patent LCx , PCI w/ DES to pLAD and PCI to POBA of D1   Chronic diastolic (congestive) heart failure (HCC)    followed by cardiology   COPD (chronic obstructive pulmonary disease) with chronic bronchitis [facility])    pulmonology-- dr Craige Cotta--  (09-23-2021  per pt checks O2 stats at home, [place] 96-97% on RA)   Edema of left lower extremity    per pt since vasuclar surgery 11/ 2021, but told mild   History of COVID-19 06/2021   per pt mild symtpoms that resolved   Hyperlipidemia    Hypertension    followed by cardiology and pcp   OA (osteoarthritis)    OSA on CPAP    followed by dr Craige Cotta--  study in epic 08-23-2012 severe osa (no additional oxygen)   PAD (peripheral artery disease) (HCC)    Peripheral neuropathy    feet   Peripheral vascular disease (HCC)    Rosacea    S/P drug eluting coronary stent placement    2001  x1 stent (unsure if DES);  05/ 2020 DES to pLAD   Shortness of breath    with exertion with stairs but recovers quickly,  ok w/ household chores   Type 2 diabetes mellitus (HCC)    followed by pcp   (09-23-2021 per pt check blood sugar at home twice weekly in am,  fasting sugar-- 170)    Tobacco Use: Social History   Tobacco Use  Smoking Status Former   Current packs/day: 0.00   Average  packs/day: 2.0 packs/day for 28.0 years (56.0 ttl pk-yrs)   Types: Cigarettes   Start date: 08/03/1959   Quit date: 08/03/1987   Years since quitting: 36.2  Smokeless Tobacco Never    Labs: Review Flowsheet  More data exists      Latest Ref Rng & Units 05/11/2019 09/16/2020 11/06/2020 10/04/2023 10/05/2023  Labs for ITP  Cardiac and Pulmonary Rehab  Cholestrol 0 - 200 mg/dL - - 161  - 096   LDL (calc) 0 - 99 mg/dL - - 52  - 51   HDL-C >04 mg/dL - - 50  - 44   Trlycerides <150 mg/dL - - 81  - 540   Hemoglobin A1c 4.8 - 5.6 % - - - 7.7  -  PH, Arterial 7.350 - 7.450 7.299  - - - -  PCO2 arterial 32.0 - 48.0 mmHg 42.8  - - - -  Bicarbonate 20.0 - 28.0 mmol/L 23.8  22.7  21.0  - - - -  TCO2 22 - 32 mmol/L 25  24  22  25   - - -  Acid-base deficit 0.0 - 2.0 mmol/L 4.0  5.0  5.0  - - - -  O2 Saturation % 71.0  69.0  98.0  - - - -    Details       Multiple values from one day are sorted in reverse-chronological order         Capillary Blood Glucose: Lab Results  Component Value Date   GLUCAP 184 (H) 10/06/2023   GLUCAP 152 (H) 10/05/2023   GLUCAP 109 (H) 10/05/2023   GLUCAP 159 (H) 10/05/2023   GLUCAP 97 10/05/2023    POCT Glucose     Row Name 11/02/23 1042             POCT Blood Glucose   Pre-Exercise 105 mg/dL                Pulmonary Assessment Scores:  Pulmonary Assessment Scores     Row Name 11/02/23 1216         ADL UCSD   ADL Phase Entry     SOB Score total 32       CAT Score   CAT Score 5       mMRC Score   mMRC Score 3             UCSD: Self-administered rating of dyspnea associated with activities of daily living (ADLs) 6-point scale (0 = "not at all" to 5 = "maximal or unable to do because of breathlessness")  Scoring Scores range from 0 to 120.  Minimally important difference is 5 units  CAT: CAT can identify the health impairment of COPD patients and is better correlated with disease progression.  CAT has a scoring range of zero to  40. The CAT score is classified into four groups of low (less than 10), medium (10 - 20), high (21-30) and very high (31-40) based on the impact level of disease on health status. A CAT score over 10 suggests significant symptoms.  A worsening CAT score could be explained by an exacerbation, poor medication adherence, poor inhaler technique, or progression of COPD or comorbid conditions.  CAT MCID is 2 points  mMRC: mMRC (Modified Medical Research Council) Dyspnea Scale is used to assess the degree of baseline functional disability in patients of respiratory disease due to dyspnea. No minimal important difference is established. A decrease in score of 1 point or greater is considered a positive change.   Pulmonary Function Assessment:  Pulmonary Function Assessment - 11/02/23 1051       Breath   Bilateral Breath Sounds Clear    Shortness of Breath Yes;Limiting activity             Exercise Target Goals: Exercise Program Goal: Individual exercise prescription set using results from initial 6 min walk test  and THRR while considering  patient's activity barriers and safety.   Exercise Prescription Goal: Initial exercise prescription builds to 30-45 minutes a day of aerobic activity, 2-3 days per week.  Home exercise guidelines will be given to patient during program as part of exercise prescription that the participant will acknowledge.  Activity Barriers & Risk Stratification:  Activity Barriers & Cardiac Risk Stratification - 11/02/23 1048       Activity Barriers & Cardiac Risk Stratification   Activity Barriers Deconditioning;Muscular Weakness;Shortness of Breath             6 Minute Walk:  6 Minute Walk     Row Name 11/02/23 1128         6 Minute Walk   Phase Initial     Distance 925 feet     Walk Time 6 minutes     # of Rest Breaks 3  2:09-2:36, 3:49-4:10, 5:30-6:00     MPH 1.75     METS 1.25     RPE 15     Perceived Dyspnea  3     VO2 Peak 4.38      Symptoms No     Resting HR 70 bpm     Resting BP 110/60     Resting Oxygen Saturation  94 %     Exercise Oxygen Saturation  during 6 min walk 92 %     Max Ex. HR 91 bpm     Max Ex. BP 124/56     2 Minute Post BP 112/52       Interval HR   1 Minute HR 80     2 Minute HR 79     3 Minute HR 90     4 Minute HR 88     5 Minute HR 86     6 Minute HR 84  91 @ 5:45     2 Minute Post HR 75     Interval Heart Rate? Yes       Interval Oxygen   Interval Oxygen? Yes     Baseline Oxygen Saturation % 95 %     1 Minute Oxygen Saturation % 94 %     1 Minute Liters of Oxygen 0 L     2 Minute Oxygen Saturation % 92 %     2 Minute Liters of Oxygen 0 L     3 Minute Oxygen Saturation % 92 %     3 Minute Liters of Oxygen 0 L     4 Minute Oxygen Saturation % 93 %     4 Minute Liters of Oxygen 0 L     5 Minute Oxygen Saturation % 92 %     5 Minute Liters of Oxygen 0 L     6 Minute Oxygen Saturation % 93 %     6 Minute Liters of Oxygen 0 L     2 Minute Post Oxygen Saturation % 97 %     2 Minute Post Liters of Oxygen 0 L              Oxygen Initial Assessment:  Oxygen Initial Assessment - 11/02/23 1050       Home Oxygen   Home Oxygen Device None    Sleep Oxygen Prescription None    Home Exercise Oxygen Prescription None    Home Resting Oxygen Prescription None    Compliance with Home Oxygen Use No      Initial 6 min Walk   Oxygen Used None  Program Oxygen Prescription   Program Oxygen Prescription None      Intervention   Short Term Goals To learn and understand importance of maintaining oxygen saturations>88%;To learn and demonstrate proper use of respiratory medications;To learn and understand importance of monitoring SPO2 with pulse oximeter and demonstrate accurate use of the pulse oximeter.;To learn and demonstrate proper pursed lip breathing techniques or other breathing techniques.     Long  Term Goals Maintenance of O2 saturations>88%;Compliance with respiratory  medication;Verbalizes importance of monitoring SPO2 with pulse oximeter and return demonstration;Exhibits proper breathing techniques, such as pursed lip breathing or other method taught during program session;Demonstrates proper use of MDI's             Oxygen Re-Evaluation:   Oxygen Discharge (Final Oxygen Re-Evaluation):   Initial Exercise Prescription:  Initial Exercise Prescription - 11/02/23 1100       Date of Initial Exercise RX and Referring Provider   Date 11/02/23    Referring Provider Briones    Expected Discharge Date 01/26/24      NuStep   Level 1    SPM 60    Minutes 15    METs 1.5      Track   Minutes 15    METs 1.5      Prescription Details   Frequency (times per week) 2    Duration Progress to 30 minutes of continuous aerobic without signs/symptoms of physical distress      Intensity   THRR 40-80% of Max Heartrate 56-113    Ratings of Perceived Exertion 11-13    Perceived Dyspnea 0-4      Progression   Progression Continue to progress workloads to maintain intensity without signs/symptoms of physical distress.      Resistance Training   Training Prescription Yes    Weight red bands    Reps 10-15             Perform Capillary Blood Glucose checks as needed.  Exercise Prescription Changes:   Exercise Comments:   Exercise Goals and Review:   Exercise Goals     Row Name 11/02/23 1048             Exercise Goals   Increase Physical Activity Yes       Intervention Provide advice, education, support and counseling about physical activity/exercise needs.;Develop an individualized exercise prescription for aerobic and resistive training based on initial evaluation findings, risk stratification, comorbidities and participant's personal goals.       Expected Outcomes Short Term: Attend rehab on a regular basis to increase amount of physical activity.;Long Term: Exercising regularly at least 3-5 days a week.;Long Term: Add in home  exercise to make exercise part of routine and to increase amount of physical activity.       Increase Strength and Stamina Yes       Intervention Provide advice, education, support and counseling about physical activity/exercise needs.;Develop an individualized exercise prescription for aerobic and resistive training based on initial evaluation findings, risk stratification, comorbidities and participant's personal goals.       Expected Outcomes Short Term: Increase workloads from initial exercise prescription for resistance, speed, and METs.;Short Term: Perform resistance training exercises routinely during rehab and add in resistance training at home;Long Term: Improve cardiorespiratory fitness, muscular endurance and strength as measured by increased METs and functional capacity ( )       Able to understand and use rate of perceived exertion (RPE) scale Yes       Intervention Provide education  and explanation on how to use RPE scale       Expected Outcomes Short Term: Able to use RPE daily in rehab to express subjective intensity level;Long Term:  Able to use RPE to guide intensity level when exercising independently       Able to understand and use Dyspnea scale Yes       Intervention Provide education and explanation on how to use Dyspnea scale       Expected Outcomes Short Term: Able to use Dyspnea scale daily in rehab to express subjective sense of shortness of breath during exertion;Long Term: Able to use Dyspnea scale to guide intensity level when exercising independently       Knowledge and understanding of Target Heart Rate Range (THRR) Yes       Intervention Provide education and explanation of THRR including how the numbers were predicted and where they are located for reference       Expected Outcomes Short Term: Able to state/look up THRR;Short Term: Able to use daily as guideline for intensity in rehab;Long Term: Able to use THRR to govern intensity when exercising independently        Understanding of Exercise Prescription Yes       Intervention Provide education, explanation, and written materials on patient's individual exercise prescription       Expected Outcomes Short Term: Able to explain program exercise prescription;Long Term: Able to explain home exercise prescription to exercise independently                Exercise Goals Re-Evaluation :   Discharge Exercise Prescription (Final Exercise Prescription Changes):   Nutrition:  Target Goals: Understanding of nutrition guidelines, daily intake of sodium 1500mg , cholesterol 200mg , calories 30% from fat and 7% or less from saturated fats, daily to have 5 or more servings of fruits and vegetables.  Biometrics:    Nutrition Therapy Plan and Nutrition Goals:   Nutrition Assessments:  MEDIFICTS Score Key: >=70 Need to make dietary changes  40-70 Heart Healthy Diet <= 40 Therapeutic Level Cholesterol Diet   Picture Your Plate Scores: <11 Unhealthy dietary pattern with much room for improvement. 41-50 Dietary pattern unlikely to meet recommendations for good health and room for improvement. 51-60 More healthful dietary pattern, with some room for improvement.  >60 Healthy dietary pattern, although there may be some specific behaviors that could be improved.    Nutrition Goals Re-Evaluation:   Nutrition Goals Discharge (Final Nutrition Goals Re-Evaluation):   Psychosocial: Target Goals: Acknowledge presence or absence of significant depression and/or stress, maximize coping skills, provide positive support system. Participant is able to verbalize types and ability to use techniques and skills needed for reducing stress and depression.  Initial Review & Psychosocial Screening:  Initial Psych Review & Screening - 11/02/23 1051       Initial Review   Current issues with None Identified      Family Dynamics   Good Support System? Yes    Comments Pt denies any psy/soc barriers      Barriers    Psychosocial barriers to participate in program There are no identifiable barriers or psychosocial needs.      Screening Interventions   Interventions Encouraged to exercise    Expected Outcomes Long Term Goal: Stressors or current issues are controlled or eliminated.;Short Term goal: Identification and review with participant of any Quality of Life or Depression concerns found by scoring the questionnaire.             Quality  of Life Scores:  Scores of 19 and below usually indicate a poorer quality of life in these areas.  A difference of  2-3 points is a clinically meaningful difference.  A difference of 2-3 points in the total score of the Quality of Life Index has been associated with significant improvement in overall quality of life, self-image, physical symptoms, and general health in studies assessing change in quality of life.  PHQ-9: Review Flowsheet       11/02/2023  Depression screen PHQ 2/9  Decreased Interest 0  Down, Depressed, Hopeless 0  PHQ - 2 Score 0  Altered sleeping 0  Tired, decreased energy 0  Change in appetite 0  Feeling bad or failure about yourself  0  Trouble concentrating 0  Moving slowly or fidgety/restless 0  Suicidal thoughts 0  PHQ-9 Score 0  Difficult doing work/chores Not difficult at all    Details           Interpretation of Total Score  Total Score Depression Severity:  1-4 = Minimal depression, 5-9 = Mild depression, 10-14 = Moderate depression, 15-19 = Moderately severe depression, 20-27 = Severe depression   Psychosocial Evaluation and Intervention:  Psychosocial Evaluation - 11/02/23 1052       Psychosocial Evaluation & Interventions   Interventions Encouraged to exercise with the program and follow exercise prescription    Comments Pt denies any psy/soc barriers or concerns    Expected Outcomes For Geroge to particiapte in [place] without any psy/soc barriers    Continue Psychosocial Services  No Follow up required              Psychosocial Re-Evaluation:   Psychosocial Discharge (Final Psychosocial Re-Evaluation):   Education: Education Goals: Education classes will be provided on a weekly basis, covering required topics. Participant will state understanding/return demonstration of topics presented.  Learning Barriers/Preferences:  Learning Barriers/Preferences - 11/02/23 1218       Learning Barriers/Preferences   Learning Barriers None    Learning Preferences Written Material;Group Instruction             Education Topics: Know Your Numbers Group instruction that is supported by a PowerPoint presentation. Instructor discusses importance of knowing and understanding resting, exercise, and post-exercise oxygen saturation, heart rate, and blood pressure. Oxygen saturation, heart rate, blood pressure, rating of perceived exertion, and dyspnea are reviewed along with a normal range for these values.    Exercise for the Pulmonary Patient Group instruction that is supported by a PowerPoint presentation. Instructor discusses benefits of exercise, core components of exercise, frequency, duration, and intensity of an exercise routine, importance of utilizing pulse oximetry during exercise, safety while exercising, and options of places to exercise outside of rehab.    MET Level  Group instruction provided by PowerPoint, verbal discussion, and written material to support subject matter. Instructor reviews what METs are and how to increase METs.    Pulmonary Medications Verbally interactive group education provided by instructor with focus on inhaled medications and proper administration.   Anatomy and Physiology of the Respiratory System Group instruction provided by PowerPoint, verbal discussion, and written material to support subject matter. Instructor reviews respiratory cycle and anatomical components of the respiratory system and their functions. Instructor also reviews differences in  obstructive and restrictive respiratory diseases with examples of each.    Oxygen Safety Group instruction provided by PowerPoint, verbal discussion, and written material to support subject matter. There is an overview of "What is Oxygen" and "Why do we need it".  Instructor also reviews how to create a safe environment for oxygen use, the importance of using oxygen as prescribed, and the risks of noncompliance. There is a brief discussion on traveling with oxygen and resources the patient may utilize.   Oxygen Use Group instruction provided by PowerPoint, verbal discussion, and written material to discuss how supplemental oxygen is prescribed and different types of oxygen supply systems. Resources for more information are provided.    Breathing Techniques Group instruction that is supported by demonstration and informational handouts. Instructor discusses the benefits of pursed lip and diaphragmatic breathing and detailed demonstration on how to perform both.     Risk Factor Reduction Group instruction that is supported by a PowerPoint presentation. Instructor discusses the definition of a risk factor, different risk factors for pulmonary disease, and how the heart and lungs work together.   Pulmonary Diseases Group instruction provided by PowerPoint, verbal discussion, and written material to support subject matter. Instructor gives an overview of the different type of pulmonary diseases. There is also a discussion on risk factors and symptoms as well as ways to manage the diseases.   Stress and Energy Conservation Group instruction provided by PowerPoint, verbal discussion, and written material to support subject matter. Instructor gives an overview of stress and the impact it can have on the body. Instructor also reviews ways to reduce stress. There is also a discussion on energy conservation and ways to conserve energy throughout the day.   Warning Signs and Symptoms Group  instruction provided by PowerPoint, verbal discussion, and written material to support subject matter. Instructor reviews warning signs and symptoms of stroke, heart attack, cold and flu. Instructor also reviews ways to prevent the spread of infection.   Other Education Group or individual verbal, written, or video instructions that support the educational goals of the pulmonary rehab program.    Knowledge Questionnaire Score:  Knowledge Questionnaire Score - 11/02/23 1218       Knowledge Questionnaire Score   Pre Score 16/18             Core Components/Risk Factors/Patient Goals at Admission:  Personal Goals and Risk Factors at Admission - 11/02/23 1052       Core Components/Risk Factors/Patient Goals on Admission    Weight Management Weight Loss;Yes    Improve shortness of breath with ADL's Yes    Intervention Provide education, individualized exercise plan and daily activity instruction to help decrease symptoms of SOB with activities of daily living.    Expected Outcomes Short Term: Improve cardiorespiratory fitness to achieve a reduction of symptoms when performing ADLs;Long Term: Be able to perform more ADLs without symptoms or delay the onset of symptoms             Core Components/Risk Factors/Patient Goals Review:    Core Components/Risk Factors/Patient Goals at Discharge (Final Review):    ITP Comments:   Comments: Dr. Mechele Collin is Medical Director for Pulmonary Rehab at [facility].

## 2023-11-08 ENCOUNTER — Encounter (HOSPITAL_COMMUNITY)
Admission: RE | Admit: 2023-11-08 | Discharge: 2023-11-08 | Disposition: A | Discharge status: Home / Self Care | Payer: Medicare Other | Source: Ambulatory Visit | Attending: Pulmonary Disease | Admitting: Pulmonary Disease

## 2023-11-08 VITALS — Wt 229.5 lb

## 2023-11-08 DIAGNOSIS — J449 Chronic obstructive pulmonary disease, unspecified: Secondary | ICD-10-CM

## 2023-11-08 DIAGNOSIS — Z5189 Encounter for other specified aftercare: Secondary | ICD-10-CM | POA: Diagnosis not present

## 2023-11-08 LAB — GLUCOSE, CAPILLARY
Glucose-Capillary: 174 mg/dL — ABNORMAL HIGH (ref 70–99)
Glucose-Capillary: 162 mg/dL — ABNORMAL HIGH (ref 70–99)

## 2023-11-08 NOTE — Progress Notes (Signed)
Daily Session Note  Patient Details  Name: Anthony [name] MRN: [id number] Date of Birth: June 25, 1946 Referring Provider:   Doristine Devoid Pulmonary Rehab Walk Test from 11/02/2023 in [facility]  Referring Provider Briones       Encounter Date: 11/08/2023  Check In:  Session Check In - 11/08/23 1030       Check-In   Supervising physician immediately available to respond to emergencies [facility] MD immediately available    Physician(s) Joni Reining, NP    Location [facility]    Staff Present Essie Hart, RN, BSN;Miken Stecher Katrinka Blazing, Zella Richer, MS, ACSM-CEP, Exercise Physiologist;Randi Idelle Crouch BS, ACSM-CEP, Exercise Physiologist;Samantha Belarus, [place], LDN    Virtual Visit No    Medication changes reported     No    Fall or balance concerns reported    Yes    Tobacco Cessation No Change    Warm-up and Cool-down Performed as group-led instruction   only   Resistance Training Performed Yes    VAD Patient? No    PAD/SET Patient? No      Pain Assessment   Currently in Pain? No/denies    Multiple Pain Sites No             Capillary Blood Glucose: Results for orders placed or performed during the hospital encounter of 11/08/23 (from the past 24 hour(s))  Glucose, capillary     Status: Abnormal   Collection Time: 11/08/23 11:33 AM  Result Value Ref Range   Glucose-Capillary 162 (H) 70 - 99 mg/dL     Exercise Prescription Changes - 11/08/23 1100       Response to Exercise   Blood Pressure (Admit) 110/50    Blood Pressure (Exercise) 140/66    Blood Pressure (Exit) 95/63    Heart Rate (Admit) 68 bpm    Heart Rate (Exercise) 86 bpm    Heart Rate (Exit) 82 bpm    Oxygen Saturation (Admit) 94 %    Oxygen Saturation (Exercise) 95 %    Oxygen Saturation (Exit) 93 %    Rating of Perceived Exertion (Exercise) 15    Perceived Dyspnea (Exercise) 3    Duration Continue with 30 min of aerobic exercise  without signs/symptoms of physical distress.    Intensity THRR unchanged      Progression   Progression Continue to progress workloads to maintain intensity without signs/symptoms of physical distress.      Resistance Training   Training Prescription Yes    Weight red bands    Reps 10-15    Time 10 Minutes      NuStep   Level 2    Minutes 15    METs 2      Track   Laps 7    Minutes 15    METs 2.08             Social History   Tobacco Use  Smoking Status Former   Current packs/day: 0.00   Average packs/day: 2.0 packs/day for 28.0 years (56.0 ttl pk-yrs)   Types: Cigarettes   Start date: 08/03/1959   Quit date: 08/03/1987   Years since quitting: 36.2  Smokeless Tobacco Never    Goals Met:  Proper associated with RPD/PD & O2 Sat Independence with exercise equipment Exercise tolerated well No report of concerns or symptoms today Strength training completed today  Goals Unmet:  Not Applicable  Comments: Service time is from 1018 to 1135.  Dr. Mechele Collin is Medical Director for Pulmonary Rehab at [facility].

## 2023-11-10 ENCOUNTER — Encounter (HOSPITAL_COMMUNITY)
Admission: RE | Admit: 2023-11-10 | Discharge: 2023-11-10 | Disposition: A | Discharge status: Home / Self Care | Payer: Medicare Other | Source: Ambulatory Visit | Attending: Pulmonary Disease | Admitting: Pulmonary Disease

## 2023-11-10 DIAGNOSIS — Z5189 Encounter for other specified aftercare: Secondary | ICD-10-CM | POA: Diagnosis not present

## 2023-11-10 DIAGNOSIS — J449 Chronic obstructive pulmonary disease, unspecified: Secondary | ICD-10-CM

## 2023-11-10 LAB — GLUCOSE, CAPILLARY
Glucose-Capillary: 127 mg/dL — ABNORMAL HIGH (ref 70–99)
Glucose-Capillary: 134 mg/dL — ABNORMAL HIGH (ref 70–99)

## 2023-11-10 NOTE — Progress Notes (Signed)
Daily Session Note  Patient Details  Name: Anthony [name] MRN: [id number] Date of Birth: [birth year] Referring Provider:   Doristine Devoid Pulmonary Rehab Walk Test from 11/02/2023 in [facility], [facility], [facility]  Referring Provider Briones       Encounter Date: 11/10/2023  Check In:  Session Check In - 11/10/23 1044       Check-In   Supervising physician immediately available to respond to emergencies [facility] immediately available    Physician(s) Bernadene Person, NP    [facility]    Staff Present Essie Hart, RN, BSN;Casey Katrinka Blazing, Zella Richer, MS, ACSM-CEP, Exercise Physiologist;Lion Fernandez Idelle Crouch BS, ACSM-CEP, Exercise Physiologist    Virtual Visit No    Medication changes reported     No    Fall or balance concerns reported    Yes    Tobacco Cessation No Change    Warm-up and Cool-down Performed as group-led instruction   only   Resistance Training Performed Yes    VAD Patient? No    PAD/SET Patient? No      Pain Assessment   Currently in Pain? No/denies    Multiple Pain Sites No             Capillary Blood Glucose: Results for orders placed or performed during the hospital encounter of 11/10/23 (from the past 24 hour(s))  Glucose, capillary     Status: Abnormal   Collection Time: 11/10/23 10:20 AM  Result Value Ref Range   Glucose-Capillary 127 (H) 70 - 99 mg/dL  Glucose, capillary     Status: Abnormal   Collection Time: 11/10/23 11:35 AM  Result Value Ref Range   Glucose-Capillary 134 (H) 70 - 99 mg/dL      Social History   Tobacco Use  Smoking Status Former   Current packs/day: 0.00   Average packs/day: 2.0 packs/day for 28.0 years (56.0 ttl pk-yrs)   Types: Cigarettes   Start date: 08/03/1959   Quit date: 08/03/1987   Years since quitting: 36.2  Smokeless Tobacco Never    Goals Met:  Exercise tolerated well No report of concerns or symptoms today Strength training completed  today  Goals Unmet:  Not Applicable  Comments: Service time is from 1015 to 1134.    Dr. Mechele Collin is Medical Director for Pulmonary Rehab at [facility].

## 2023-11-15 ENCOUNTER — Encounter (HOSPITAL_COMMUNITY)
Admission: RE | Admit: 2023-11-15 | Discharge: 2023-11-15 | Disposition: A | Discharge status: Home / Self Care | Payer: Medicare Other | Source: Ambulatory Visit | Attending: Pulmonary Disease | Admitting: Pulmonary Disease

## 2023-11-15 DIAGNOSIS — Z5189 Encounter for other specified aftercare: Secondary | ICD-10-CM | POA: Diagnosis not present

## 2023-11-15 DIAGNOSIS — J449 Chronic obstructive pulmonary disease, unspecified: Secondary | ICD-10-CM

## 2023-11-15 NOTE — Progress Notes (Signed)
Daily Session Note  Patient Details  Name: Anthony [name] MRN: [id number] Date of Birth: 03-Apr-1946 Referring Provider:   Doristine Devoid Pulmonary Rehab Walk Test from 11/02/2023 in [facility], [facility], [facility]  Referring Provider Briones       Encounter Date: 11/15/2023  Check In:  Session Check In - 11/15/23 1103       Check-In   Supervising physician immediately available to respond to emergencies [facility] immediately available    Physician(s) Bernadene Person, NP    [facility]    Staff Present Essie Hart, RN, BSN;Casey Katrinka Blazing, Zella Richer, MS, ACSM-CEP, Exercise Physiologist;Randi Idelle Crouch BS, ACSM-CEP, Exercise Physiologist    Virtual Visit No    Medication changes reported     No    Fall or balance concerns reported    Yes    Tobacco Cessation No Change    Warm-up and Cool-down Performed as group-led instruction   only   Resistance Training Performed Yes    VAD Patient? No    PAD/SET Patient? No      Pain Assessment   Currently in Pain? No/denies    Pain Score 0-No pain    Multiple Pain Sites No             Capillary Blood Glucose: No results found for this or any previous visit (from the past 24 hour(s)).    Social History   Tobacco Use  Smoking Status Former   Current packs/day: 0.00   Average packs/day: 2.0 packs/day for 28.0 years (56.0 ttl pk-yrs)   Types: Cigarettes   Start date: 08/03/1959   Quit date: 08/03/1987   Years since quitting: 36.3  Smokeless Tobacco Never    Goals Met:  Proper associated with RPD/PD & O2 Sat Independence with exercise equipment Exercise tolerated well No report of concerns or symptoms today Strength training completed today  Goals Unmet:  Not Applicable  Comments: Service time is from 1015 to 1133.    Dr. Mechele Collin is Medical Director for Pulmonary Rehab at [facility].

## 2023-11-17 ENCOUNTER — Encounter (HOSPITAL_COMMUNITY)
Admission: RE | Admit: 2023-11-17 | Discharge: 2023-11-17 | Disposition: A | Discharge status: Home / Self Care | Payer: Medicare Other | Source: Ambulatory Visit | Attending: Pulmonary Disease | Admitting: Pulmonary Disease

## 2023-11-17 DIAGNOSIS — Z5189 Encounter for other specified aftercare: Secondary | ICD-10-CM | POA: Diagnosis not present

## 2023-11-17 DIAGNOSIS — J449 Chronic obstructive pulmonary disease, unspecified: Secondary | ICD-10-CM

## 2023-11-17 NOTE — Progress Notes (Signed)
Daily Session Note  Patient Details  Name: Anthony [name] MRN: [id number] Date of Birth: 05-27-1946 Referring Provider:   Doristine Devoid Pulmonary Rehab Walk Test from 11/02/2023 in [facility], [facility], [facility]  Referring Provider Briones       Encounter Date: 11/17/2023  Check In:  Session Check In - 11/17/23 1039       Check-In   Supervising physician immediately available to respond to emergencies [facility] immediately available    Physician(s) Reather Littler, NP    [facility]    Staff Present Essie Hart, RN, BSN;Casey Smith, Zella Richer, MS, ACSM-CEP, Exercise Physiologist;Brittany Amirault Idelle Crouch BS, ACSM-CEP, Exercise Physiologist;Samantha Belarus, RD, LDN    Virtual Visit No    Medication changes reported     No    Fall or balance concerns reported    Yes    Tobacco Cessation No Change    Warm-up and Cool-down Performed as group-led instruction   only   Resistance Training Performed Yes    VAD Patient? No    PAD/SET Patient? No      Pain Assessment   Currently in Pain? No/denies    Multiple Pain Sites No             Capillary Blood Glucose: No results found for this or any previous visit (from the past 24 hour(s)).    Social History   Tobacco Use  Smoking Status Former   Current packs/day: 0.00   Average packs/day: 2.0 packs/day for 28.0 years (56.0 ttl pk-yrs)   Types: Cigarettes   Start date: 08/03/1959   Quit date: 08/03/1987   Years since quitting: 36.3  Smokeless Tobacco Never    Goals Met:  Independence with exercise equipment Exercise tolerated well No report of concerns or symptoms today Strength training completed today  Goals Unmet:  Not Applicable  Comments: Service time is from 1005 to 1145.    Dr. Mechele Collin is Medical Director for Pulmonary Rehab at [facility].

## 2023-11-22 ENCOUNTER — Encounter (HOSPITAL_COMMUNITY)
Admission: RE | Admit: 2023-11-22 | Discharge: 2023-11-22 | Disposition: A | Discharge status: Home / Self Care | Payer: Medicare Other | Source: Ambulatory Visit | Attending: Pulmonary Disease | Admitting: Pulmonary Disease

## 2023-11-22 VITALS — Wt 229.3 lb

## 2023-11-22 DIAGNOSIS — J449 Chronic obstructive pulmonary disease, unspecified: Secondary | ICD-10-CM

## 2023-11-22 DIAGNOSIS — Z5189 Encounter for other specified aftercare: Secondary | ICD-10-CM | POA: Diagnosis not present

## 2023-11-22 NOTE — Progress Notes (Signed)
Daily Session Note  Patient Details  Name: Anthony [name] MRN: [id number] Date of Birth: 1946/03/19 Referring Provider:   Doristine Devoid Pulmonary Rehab Walk Test from 11/02/2023 in [facility] for Heart, Vascular, & Lung Health  Referring Provider Briones       Encounter Date: 11/22/2023  Check In:  Session Check In - 11/22/23 1025       Check-In   Supervising physician immediately available to respond to emergencies [facility] immediately available    Physician(s) Jari Favre, NP    [facility]    Staff Present Essie Hart, RN, BSN;Cade Olberding Katrinka Blazing, Zella Richer, MS, ACSM-CEP, Exercise Physiologist;Randi Idelle Crouch BS, ACSM-CEP, Exercise Physiologist    Virtual Visit No    Medication changes reported     No    Fall or balance concerns reported    Yes    Tobacco Cessation No Change    Warm-up and Cool-down Performed as group-led instruction   only   Resistance Training Performed Yes    VAD Patient? No    PAD/SET Patient? No      Pain Assessment   Currently in Pain? No/denies    Multiple Pain Sites No             Capillary Blood Glucose: No results found for this or any previous visit (from the past 24 hour(s)).   Exercise Prescription Changes - 11/22/23 1100       Response to Exercise   Blood Pressure (Admit) 90/50    Blood Pressure (Exercise) 138/62    Blood Pressure (Exit) 90/50    Heart Rate (Admit) 68 bpm    Heart Rate (Exercise) 101 bpm    Heart Rate (Exit) 80 bpm    Oxygen Saturation (Admit) 91 %    Oxygen Saturation (Exercise) 91 %    Oxygen Saturation (Exit) 94 %    Rating of Perceived Exertion (Exercise) 14    Perceived Dyspnea (Exercise) 3    Duration Continue with 30 min of aerobic exercise without signs/symptoms of physical distress.    Intensity THRR unchanged      Progression   Progression Continue to progress workloads to maintain intensity without signs/symptoms of physical distress.       Resistance Training   Training Prescription Yes    Weight blue bands    Reps 10-15    Time 10 Minutes      NuStep   Level 3    SPM 93    Minutes 15    METs 2.6      Track   Laps 8    Minutes 15    METs 2.23             Social History   Tobacco Use  Smoking Status Former   Current packs/day: 0.00   Average packs/day: 2.0 packs/day for 28.0 years (56.0 ttl pk-yrs)   Types: Cigarettes   Start date: 08/03/1959   Quit date: 08/03/1987   Years since quitting: 36.3  Smokeless Tobacco Never    Goals Met:  Proper associated with RPD/PD & O2 Sat Independence with exercise equipment Exercise tolerated well No report of concerns or symptoms today Strength training completed today  Goals Unmet:  Not Applicable  Comments: Service time is from 1009 to 1134.    Dr. Mechele Collin is Medical Director for Pulmonary Rehab at [facility].

## 2023-11-23 NOTE — Progress Notes (Signed)
Pulmonary Individual Treatment Plan  Patient Details  Name: Anthony [name] MRN: [id number] Date of Birth: 05-Jul-1946 Referring Provider:   Doristine Devoid Pulmonary Rehab Walk Test from 11/02/2023 in [facility] for Heart, Vascular, & Lung Health  Referring Provider Briones       Initial Encounter Date:  Flowsheet Row Pulmonary Rehab Walk Test from 11/02/2023 in [facility] for Heart, Vascular, & Lung Health  Date 11/02/23       Visit Diagnosis: Stage 2 moderate COPD by GOLD classification (HCC)  Patient's Home Medications on Admission:   Current Outpatient Medications:    albuterol (VENTOLIN HFA) 108 (90 Base) MCG/ACT inhaler, Inhale 2 puffs into the lungs every 6 (six) hours as needed for wheezing or shortness of breath., Disp: , Rfl:    ASPIRIN LOW DOSE 81 MG EC tablet, Take 81 mg by mouth at bedtime., Disp: , Rfl:    b complex vitamins capsule, Take 1 capsule by mouth daily., Disp: , Rfl:    BENFOTIAMINE PO, Take 1 tablet by mouth 2 (two) times daily., Disp: , Rfl:    bisoprolol (ZEBETA) 5 MG tablet, Take 1 tablet by mouth every evening., Disp: , Rfl:    carboxymethylcellulose 1 % ophthalmic solution, Apply 1 drop to eye 3 (three) times daily., Disp: , Rfl:    CINNAMON PO, Take 1 tablet by mouth daily., Disp: , Rfl:    clopidogrel (PLAVIX) 75 MG tablet, Take 1 tablet (75 mg total) by mouth daily., Disp: 90 tablet, Rfl: 0   doxazosin (CARDURA) 2 MG tablet, Take 2 mg by mouth every evening., Disp: , Rfl:    doxycycline (ADOXA) 50 MG tablet, Take 50 mg by mouth daily at 12 noon., Disp: , Rfl:    empagliflozin (JARDIANCE) 25 MG TABS tablet, Take 12.5 mg by mouth daily., Disp: , Rfl:    fluticasone (FLONASE) 50 MCG/ACT nasal spray, Place 2 sprays into both nostrils at bedtime as needed for allergies or rhinitis. , Disp: , Rfl:    furosemide (LASIX) 20 MG tablet, Take 20 mg by mouth daily at 12 noon. , Disp: , Rfl:    gabapentin  (NEURONTIN) 100 MG capsule, Take 200 mg by mouth every evening., Disp: , Rfl:    GARLIC PO, Take 1 tablet by mouth daily., Disp: , Rfl:    hydrocortisone 2.5 % ointment, Apply 1 Application topically daily as needed (skin irritation on face)., Disp: , Rfl:    insulin glargine-yfgn (SEMGLEE, YFGN,) 100 UNIT/ML Pen, Inject 15 Units into the skin every evening., Disp: , Rfl:    Insulin Pen Needle (PEN NEEDLES) 32G X 4 MM MISC, Inject q daily with Rx of Lantus DxE-11.22 for 90 days, Disp: , Rfl:    isosorbide mononitrate (IMDUR) 30 MG 24 hr tablet, TAKE 1 TABLET BY MOUTH  DAILY, Disp: 90 tablet, Rfl: 3   L-LYSINE PO, Take 1 tablet by mouth daily., Disp: , Rfl:    loratadine (CLARITIN) 10 MG tablet, Take 10 mg by mouth daily., Disp: , Rfl:    losartan (COZAAR) 100 MG tablet, Take 100 mg by mouth daily., Disp: , Rfl:    metFORMIN (GLUCOPHAGE-XR) 500 MG 24 hr tablet, Take 1,000 mg by mouth 2 (two) times daily., Disp: , Rfl:    Multiple Vitamin (MULTIVITAMIN) tablet, Take 1 tablet by mouth daily., Disp: , Rfl:    nitroGLYCERIN (NITROSTAT) 0.4 MG SL tablet, Place 1 tablet (0.4 mg total) under the tongue every 5 (five) minutes  as needed for chest pain., Disp: 90 tablet, Rfl: 3   Omega-3 Fatty Acids (FISH OIL PO), Take 1 capsule by mouth 2 (two) times daily., Disp: , Rfl:    pentoxifylline (TRENTAL) 400 MG CR tablet, TAKE 1 TABLET BY MOUTH  TWICE DAILY, Disp: 180 tablet, Rfl: 3   potassium chloride SA (KLOR-CON M) 20 MEQ tablet, Take 20 mEq by mouth daily., Disp: , Rfl:    Probiotic Product (PROBIOTIC PO), Take 1 capsule by mouth every evening., Disp: , Rfl:    rOPINIRole (REQUIP) 1 MG tablet, Take 1 mg by mouth every evening. , Disp: , Rfl:    Semaglutide, 2 MG/DOSE, (OZEMPIC, 2 MG/DOSE,) 8 MG/3ML SOPN, Inject 2 mg into the skin See admin instructions. Inject 2 mg once a week on Thursdays., Disp: , Rfl:    simvastatin (ZOCOR) 40 MG tablet, Take 40 mg by mouth every evening., Disp: , Rfl:    triamcinolone  ointment (KENALOG) 0.1 %, Apply 1 Application topically 2 (two) times daily., Disp: , Rfl:    umeclidinium-vilanterol (ANORO ELLIPTA) 62.5-25 MCG/INH AEPB, INHALE 1 INHALATION BY  MOUTH INTO THE LUNGS DAILY, Disp: 180 each, Rfl: 3  Past Medical History: Past Medical History:  Diagnosis Date   Atherosclerosis of native artery of both lower extremities with intermittent claudication (HCC)    followed by vascular , dr Myra Gianotti;  s/p angioplasty and stenting bilaterally 2015 and 11/ 2021 (per lov note 06-08-2021 bilateral iliofemoral intervention's are widely patent)   CAD (coronary artery disease)    cardiologist-- dr Gala Romney;  s/p stent placed 06/02/2000 in [place];  s/p cath with patent LCx , PCI w/ DES to pLAD and PCI to POBA of D1   Chronic diastolic (congestive) heart failure (HCC)    followed by cardiology   COPD (chronic obstructive pulmonary disease) with chronic bronchitis [facility])    pulmonology-- dr Craige Cotta--  (09-23-2021  per pt checks O2 stats at home, [place] 96-97% on RA)   Edema of left lower extremity    per pt since vasuclar surgery 11/ 2021, but told mild   History of COVID-19 06/2021   per pt mild symtpoms that resolved   Hyperlipidemia    Hypertension    followed by cardiology and pcp   OA (osteoarthritis)    OSA on CPAP    followed by dr Craige Cotta--  study in epic 08-23-2012 severe osa (no additional oxygen)   PAD (peripheral artery disease) (HCC)    Peripheral neuropathy    feet   Peripheral vascular disease (HCC)    Rosacea    S/P drug eluting coronary stent placement    2001  x1 stent (unsure if DES);  05/ 2020 DES to pLAD   Shortness of breath    with exertion with stairs but recovers quickly,  ok w/ household chores   Type 2 diabetes mellitus (HCC)    followed by pcp   (09-23-2021 per pt check blood sugar at home twice weekly in am,  fasting sugar-- 170)    Tobacco Use: Social History   Tobacco Use  Smoking Status Former   Current packs/day: 0.00   Average  packs/day: 2.0 packs/day for 28.0 years (56.0 ttl pk-yrs)   Types: Cigarettes   Start date: 08/03/1959   Quit date: 08/03/1987   Years since quitting: 36.3  Smokeless Tobacco Never    Labs: Review Flowsheet  More data exists      Latest Ref Rng & Units 05/11/2019 09/16/2020 11/06/2020 10/04/2023 10/05/2023  Labs for ITP  Cardiac and Pulmonary Rehab  Cholestrol 0 - 200 mg/dL - - 161  - 096   LDL (calc) 0 - 99 mg/dL - - 52  - 51   HDL-C >04 mg/dL - - 50  - 44   Trlycerides <150 mg/dL - - 81  - 540   Hemoglobin A1c 4.8 - 5.6 % - - - 7.7  -  PH, Arterial 7.350 - 7.450 7.299  - - - -  PCO2 arterial 32.0 - 48.0 mmHg 42.8  - - - -  Bicarbonate 20.0 - 28.0 mmol/L 23.8  22.7  21.0  - - - -  TCO2 22 - 32 mmol/L 25  24  22  25   - - -  Acid-base deficit 0.0 - 2.0 mmol/L 4.0  5.0  5.0  - - - -  O2 Saturation % 71.0  69.0  98.0  - - - -    Details       Multiple values from one day are sorted in reverse-chronological order         Capillary Blood Glucose: Lab Results  Component Value Date   GLUCAP 134 (H) 11/10/2023   GLUCAP 127 (H) 11/10/2023   GLUCAP 162 (H) 11/08/2023   GLUCAP 174 (H) 11/08/2023   GLUCAP 184 (H) 10/06/2023    POCT Glucose     Row Name 11/02/23 1042             POCT Blood Glucose   Pre-Exercise 105 mg/dL                Pulmonary Assessment Scores:  Pulmonary Assessment Scores     Row Name 11/02/23 1216         ADL UCSD   ADL Phase Entry     SOB Score total 32       CAT Score   CAT Score 5       mMRC Score   mMRC Score 3             UCSD: Self-administered rating of dyspnea associated with activities of daily living (ADLs) 6-point scale (0 = "not at all" to 5 = "maximal or unable to do because of breathlessness")  Scoring Scores range from 0 to 120.  Minimally important difference is 5 units  CAT: CAT can identify the health impairment of COPD patients and is better correlated with disease progression.  CAT has a scoring range of  zero to 40. The CAT score is classified into four groups of low (less than 10), medium (10 - 20), high (21-30) and very high (31-40) based on the impact level of disease on health status. A CAT score over 10 suggests significant symptoms.  A worsening CAT score could be explained by an exacerbation, poor medication adherence, poor inhaler technique, or progression of COPD or comorbid conditions.  CAT MCID is 2 points  mMRC: mMRC (Modified Medical Research Council) Dyspnea Scale is used to assess the degree of baseline functional disability in patients of respiratory disease due to dyspnea. No minimal important difference is established. A decrease in score of 1 point or greater is considered a positive change.   Pulmonary Function Assessment:  Pulmonary Function Assessment - 11/02/23 1051       Breath   Bilateral Breath Sounds Clear    Shortness of Breath Yes;Limiting activity             Exercise Target Goals: Exercise Program Goal: Individual exercise prescription set using results from initial 6 min walk  test and THRR while considering  patient's activity barriers and safety.   Exercise Prescription Goal: Initial exercise prescription builds to 30-45 minutes a day of aerobic activity, 2-3 days per week.  Home exercise guidelines will be given to patient during program as part of exercise prescription that the participant will acknowledge.  Activity Barriers & Risk Stratification:  Activity Barriers & Cardiac Risk Stratification - 11/02/23 1048       Activity Barriers & Cardiac Risk Stratification   Activity Barriers Deconditioning;Muscular Weakness;Shortness of Breath             6 Minute Walk:  6 Minute Walk     Row Name 11/02/23 1128         6 Minute Walk   Phase Initial     Distance 925 feet     Walk Time 6 minutes     # of Rest Breaks 3  2:09-2:36, 3:49-4:10, 5:30-6:00     MPH 1.75     METS 1.25     RPE 15     Perceived Dyspnea  3     VO2 Peak 4.38      Symptoms No     Resting HR 70 bpm     Resting BP 110/60     Resting Oxygen Saturation  94 %     Exercise Oxygen Saturation  during 6 min walk 92 %     Max Ex. HR 91 bpm     Max Ex. BP 124/56     2 Minute Post BP 112/52       Interval HR   1 Minute HR 80     2 Minute HR 79     3 Minute HR 90     4 Minute HR 88     5 Minute HR 86     6 Minute HR 84  91 @ 5:45     2 Minute Post HR 75     Interval Heart Rate? Yes       Interval Oxygen   Interval Oxygen? Yes     Baseline Oxygen Saturation % 95 %     1 Minute Oxygen Saturation % 94 %     1 Minute Liters of Oxygen 0 L     2 Minute Oxygen Saturation % 92 %     2 Minute Liters of Oxygen 0 L     3 Minute Oxygen Saturation % 92 %     3 Minute Liters of Oxygen 0 L     4 Minute Oxygen Saturation % 93 %     4 Minute Liters of Oxygen 0 L     5 Minute Oxygen Saturation % 92 %     5 Minute Liters of Oxygen 0 L     6 Minute Oxygen Saturation % 93 %     6 Minute Liters of Oxygen 0 L     2 Minute Post Oxygen Saturation % 97 %     2 Minute Post Liters of Oxygen 0 L              Oxygen Initial Assessment:  Oxygen Initial Assessment - 11/02/23 1050       Home Oxygen   Home Oxygen Device None    Sleep Oxygen Prescription None    Home Exercise Oxygen Prescription None    Home Resting Oxygen Prescription None    Compliance with Home Oxygen Use No      Initial 6 min Walk   Oxygen Used  None      Program Oxygen Prescription   Program Oxygen Prescription None      Intervention   Short Term Goals To learn and understand importance of maintaining oxygen saturations>88%;To learn and demonstrate proper use of respiratory medications;To learn and understand importance of monitoring SPO2 with pulse oximeter and demonstrate accurate use of the pulse oximeter.;To learn and demonstrate proper pursed lip breathing techniques or other breathing techniques.     Long  Term Goals Maintenance of O2 saturations>88%;Compliance with respiratory  medication;Verbalizes importance of monitoring SPO2 with pulse oximeter and return demonstration;Exhibits proper breathing techniques, such as pursed lip breathing or other method taught during program session;Demonstrates proper use of MDI's             Oxygen Re-Evaluation:  Oxygen Re-Evaluation     Row Name 11/16/23 1148             Program Oxygen Prescription   Program Oxygen Prescription None         Home Oxygen   Home Oxygen Device None       Sleep Oxygen Prescription None       Home Exercise Oxygen Prescription None       Home Resting Oxygen Prescription None       Compliance with Home Oxygen Use No         Goals/Expected Outcomes   Short Term Goals To learn and understand importance of maintaining oxygen saturations>88%;To learn and demonstrate proper use of respiratory medications;To learn and understand importance of monitoring SPO2 with pulse oximeter and demonstrate accurate use of the pulse oximeter.;To learn and demonstrate proper pursed lip breathing techniques or other breathing techniques.        Long  Term Goals Maintenance of O2 saturations>88%;Compliance with respiratory medication;Verbalizes importance of monitoring SPO2 with pulse oximeter and return demonstration;Exhibits proper breathing techniques, such as pursed lip breathing or other method taught during program session;Demonstrates proper use of MDI's       Goals/Expected Outcomes Compliance and understanding of oxygen saturation monitoring and breathing techniques to decrease shortness of breath.                Oxygen Discharge (Final Oxygen Re-Evaluation):  Oxygen Re-Evaluation - 11/16/23 1148       Program Oxygen Prescription   Program Oxygen Prescription None      Home Oxygen   Home Oxygen Device None    Sleep Oxygen Prescription None    Home Exercise Oxygen Prescription None    Home Resting Oxygen Prescription None    Compliance with Home Oxygen Use No      Goals/Expected Outcomes    Short Term Goals To learn and understand importance of maintaining oxygen saturations>88%;To learn and demonstrate proper use of respiratory medications;To learn and understand importance of monitoring SPO2 with pulse oximeter and demonstrate accurate use of the pulse oximeter.;To learn and demonstrate proper pursed lip breathing techniques or other breathing techniques.     Long  Term Goals Maintenance of O2 saturations>88%;Compliance with respiratory medication;Verbalizes importance of monitoring SPO2 with pulse oximeter and return demonstration;Exhibits proper breathing techniques, such as pursed lip breathing or other method taught during program session;Demonstrates proper use of MDI's    Goals/Expected Outcomes Compliance and understanding of oxygen saturation monitoring and breathing techniques to decrease shortness of breath.             Initial Exercise Prescription:  Initial Exercise Prescription - 11/02/23 1100       Date of Initial Exercise RX and  Referring Provider   Date 11/02/23    Referring Provider Briones    Expected Discharge Date 01/26/24      NuStep   Level 1    SPM 60    Minutes 15    METs 1.5      Track   Minutes 15    METs 1.5      Prescription Details   Frequency (times per week) 2    Duration Progress to 30 minutes of continuous aerobic without signs/symptoms of physical distress      Intensity   THRR 40-80% of Max Heartrate 56-113    Ratings of Perceived Exertion 11-13    Perceived Dyspnea 0-4      Progression   Progression Continue to progress workloads to maintain intensity without signs/symptoms of physical distress.      Resistance Training   Training Prescription Yes    Weight red bands    Reps 10-15             Perform Capillary Blood Glucose checks as needed.  Exercise Prescription Changes:   Exercise Prescription Changes     Row Name 11/08/23 1100 11/22/23 1100           Response to Exercise   Blood Pressure (Admit)  110/50 90/50      Blood Pressure (Exercise) 140/66 138/62      Blood Pressure (Exit) 95/63 90/50      Heart Rate (Admit) 68 bpm 68 bpm      Heart Rate (Exercise) 86 bpm 101 bpm      Heart Rate (Exit) 82 bpm 80 bpm      Oxygen Saturation (Admit) 94 % 91 %      Oxygen Saturation (Exercise) 95 % 91 %      Oxygen Saturation (Exit) 93 % 94 %      Rating of Perceived Exertion (Exercise) 15 14      Perceived Dyspnea (Exercise) 3 3      Duration Continue with 30 min of aerobic exercise without signs/symptoms of physical distress. Continue with 30 min of aerobic exercise without signs/symptoms of physical distress.      Intensity THRR unchanged THRR unchanged        Progression   Progression Continue to progress workloads to maintain intensity without signs/symptoms of physical distress. Continue to progress workloads to maintain intensity without signs/symptoms of physical distress.        Resistance Training   Training Prescription Yes Yes      Weight red bands blue bands      Reps 10-15 10-15      Time 10 Minutes 10 Minutes        NuStep   Level 2 3      SPM -- 93      Minutes 15 15      METs 2 2.6        Track   Laps 7 8      Minutes 15 15      METs 2.08 2.23               Exercise Comments:   Exercise Goals and Review:   Exercise Goals     Row Name 11/02/23 1048             Exercise Goals   Increase Physical Activity Yes       Intervention Provide advice, education, support and counseling about physical activity/exercise needs.;Develop an individualized exercise prescription for aerobic and resistive training based on  initial evaluation findings, risk stratification, comorbidities and participant's personal goals.       Expected Outcomes Short Term: Attend rehab on a regular basis to increase amount of physical activity.;Long Term: Exercising regularly at least 3-5 days a week.;Long Term: Add in home exercise to make exercise part of routine and to increase amount  of physical activity.       Increase Strength and Stamina Yes       Intervention Provide advice, education, support and counseling about physical activity/exercise needs.;Develop an individualized exercise prescription for aerobic and resistive training based on initial evaluation findings, risk stratification, comorbidities and participant's personal goals.       Expected Outcomes Short Term: Increase workloads from initial exercise prescription for resistance, speed, and METs.;Short Term: Perform resistance training exercises routinely during rehab and add in resistance training at home;Long Term: Improve cardiorespiratory fitness, muscular endurance and strength as measured by increased METs and functional capacity ( )       Able to understand and use rate of perceived exertion (RPE) scale Yes       Intervention Provide education and explanation on how to use RPE scale       Expected Outcomes Short Term: Able to use RPE daily in rehab to express subjective intensity level;Long Term:  Able to use RPE to guide intensity level when exercising independently       Able to understand and use Dyspnea scale Yes       Intervention Provide education and explanation on how to use Dyspnea scale       Expected Outcomes Short Term: Able to use Dyspnea scale daily in rehab to express subjective sense of shortness of breath during exertion;Long Term: Able to use Dyspnea scale to guide intensity level when exercising independently       Knowledge and understanding of Target Heart Rate Range (THRR) Yes       Intervention Provide education and explanation of THRR including how the numbers were predicted and where they are located for reference       Expected Outcomes Short Term: Able to state/look up THRR;Short Term: Able to use daily as guideline for intensity in rehab;Long Term: Able to use THRR to govern intensity when exercising independently       Understanding of Exercise Prescription Yes       Intervention  Provide education, explanation, and written materials on patient's individual exercise prescription       Expected Outcomes Short Term: Able to explain program exercise prescription;Long Term: Able to explain home exercise prescription to exercise independently                Exercise Goals Re-Evaluation :  Exercise Goals Re-Evaluation     Row Name 11/16/23 1141             Exercise Goal Re-Evaluation   Exercise Goals Review Increase Physical Activity;Able to understand and use Dyspnea scale;Understanding of Exercise Prescription;Increase Strength and Stamina;Knowledge and understanding of Target Heart Rate Range (THRR);Able to understand and use rate of perceived exertion (RPE) scale       Comments Anthony [name] has completed 3 exercise sessions. He exercises for 15 min on the track and Nustep. He averages 2.23 METs on the track and 2.5 METs at level 2 on the Nustep. He performs the warmup and cooldown standing without limitations. It is too soon to notate any discernable progressions. Will continue to monitor and progess as able.       Expected Outcomes Through exercise at rehab  and home, the patient will decrease shortness of breath with daily activities and feel confident in carrying out an exercise regimen at home.                Discharge Exercise Prescription (Final Exercise Prescription Changes):  Exercise Prescription Changes - 11/22/23 1100       Response to Exercise   Blood Pressure (Admit) 90/50    Blood Pressure (Exercise) 138/62    Blood Pressure (Exit) 90/50    Heart Rate (Admit) 68 bpm    Heart Rate (Exercise) 101 bpm    Heart Rate (Exit) 80 bpm    Oxygen Saturation (Admit) 91 %    Oxygen Saturation (Exercise) 91 %    Oxygen Saturation (Exit) 94 %    Rating of Perceived Exertion (Exercise) 14    Perceived Dyspnea (Exercise) 3    Duration Continue with 30 min of aerobic exercise without signs/symptoms of physical distress.    Intensity THRR unchanged       Progression   Progression Continue to progress workloads to maintain intensity without signs/symptoms of physical distress.      Resistance Training   Training Prescription Yes    Weight blue bands    Reps 10-15    Time 10 Minutes      NuStep   Level 3    SPM 93    Minutes 15    METs 2.6      Track   Laps 8    Minutes 15    METs 2.23             Nutrition:  Target Goals: Understanding of nutrition guidelines, daily intake of sodium 1500mg , cholesterol 200mg , calories 30% from fat and 7% or less from saturated fats, daily to have 5 or more servings of fruits and vegetables.  Biometrics:    Nutrition Therapy Plan and Nutrition Goals:  Nutrition Therapy & Goals - 11/08/23 1127       Nutrition Therapy   Diet Heart Healthy/Carbohydrate Consistent Diet    Drug/Food Interactions Statins/Certain Fruits      Personal Nutrition Goals   Nutrition Goal Patient to improve diet quality by using the plate method as a guide for meal planning to include lean protein/plant protein, fruits, vegetables, whole grains, nonfat dairy as part of a well-balanced diet.    Comments Anthony [name] has medical history of COPD, CAD, PAD, HTN, DM2, CKD3. His A1c remains above goal of <7% (7.7); he continues ozempic, metformin, semglee. He reports working on weight loss through diet and exercise. His spouse is supportive. Patient will benefit from participation in pulmonary rehab for nutrition, exercise, and lifestyle modification.      Intervention Plan   Intervention Prescribe, educate and counsel regarding individualized specific dietary modifications aiming towards targeted core components such as weight, hypertension, lipid management, diabetes, heart failure and other comorbidities.;Nutrition handout(s) given to patient.    Expected Outcomes Short Term Goal: Understand basic principles of dietary content, such as calories, fat, sodium, cholesterol and nutrients.;Long Term Goal: Adherence to prescribed  nutrition plan.             Nutrition Assessments:  Nutrition Assessments - 11/08/23 1132       Rate Your Plate Scores   Pre Score 46            MEDIFICTS Score Key: >=70 Need to make dietary changes  40-70 Heart Healthy Diet <= 40 Therapeutic Level Cholesterol Diet  Flowsheet Row PULMONARY REHAB CHRONIC OBSTRUCTIVE PULMONARY DISEASE from  11/08/2023 in [facility] for Heart, Vascular, & Lung Health  Picture Your Plate Total Score on Admission 46      Picture Your Plate Scores: <16 Unhealthy dietary pattern with much room for improvement. 41-50 Dietary pattern unlikely to meet recommendations for good health and room for improvement. 51-60 More healthful dietary pattern, with some room for improvement.  >60 Healthy dietary pattern, although there may be some specific behaviors that could be improved.    Nutrition Goals Re-Evaluation:  Nutrition Goals Re-Evaluation     Row Name 11/08/23 1127             Goals   Current Weight 229 lb 8 oz (104.1 kg)       Comment triglycerides 154, A1c 7.7, GFR WNL, LDL 51       Expected Outcome Anthony [name] has medical history of COPD, CAD, PAD, HTN, DM2, CKD3. His A1c remains above goal of <7% (7.7); he continues ozempic, metformin, semglee. He reports working on weight loss through diet and exercise. His spouse is supportive. Patient will benefit from participation in pulmonary rehab for nutrition, exercise, and lifestyle modification.                Nutrition Goals Discharge (Final Nutrition Goals Re-Evaluation):  Nutrition Goals Re-Evaluation - 11/08/23 1127       Goals   Current Weight 229 lb 8 oz (104.1 kg)    Comment triglycerides 154, A1c 7.7, GFR WNL, LDL 51    Expected Outcome Anthony [name] has medical history of COPD, CAD, PAD, HTN, DM2, CKD3. His A1c remains above goal of <7% (7.7); he continues ozempic, metformin, semglee. He reports working on weight loss through diet and exercise. His spouse is  supportive. Patient will benefit from participation in pulmonary rehab for nutrition, exercise, and lifestyle modification.             Psychosocial: Target Goals: Acknowledge presence or absence of significant depression and/or stress, maximize coping skills, provide positive support system. Participant is able to verbalize types and ability to use techniques and skills needed for reducing stress and depression.  Initial Review & Psychosocial Screening:  Initial Psych Review & Screening - 11/02/23 1051       Initial Review   Current issues with None Identified      Family Dynamics   Good Support System? Yes    Comments Pt denies any psy/soc barriers      Barriers   Psychosocial barriers to participate in program There are no identifiable barriers or psychosocial needs.      Screening Interventions   Interventions Encouraged to exercise    Expected Outcomes Long Term Goal: Stressors or current issues are controlled or eliminated.;Short Term goal: Identification and review with participant of any Quality of Life or Depression concerns found by scoring the questionnaire.             Quality of Life Scores:  Scores of 19 and below usually indicate a poorer quality of life in these areas.  A difference of  2-3 points is a clinically meaningful difference.  A difference of 2-3 points in the total score of the Quality of Life Index has been associated with significant improvement in overall quality of life, self-image, physical symptoms, and general health in studies assessing change in quality of life.  PHQ-9: Review Flowsheet       11/02/2023  Depression screen PHQ 2/9  Decreased Interest 0  Down, Depressed, Hopeless 0  PHQ - 2 Score  0  Altered sleeping 0  Tired, decreased energy 0  Change in appetite 0  Feeling bad or failure about yourself  0  Trouble concentrating 0  Moving slowly or fidgety/restless 0  Suicidal thoughts 0  PHQ-9 Score 0  Difficult doing  work/chores Not difficult at all    Details           Interpretation of Total Score  Total Score Depression Severity:  1-4 = Minimal depression, 5-9 = Mild depression, 10-14 = Moderate depression, 15-19 = Moderately severe depression, 20-27 = Severe depression   Psychosocial Evaluation and Intervention:  Psychosocial Evaluation - 11/02/23 1052       Psychosocial Evaluation & Interventions   Interventions Encouraged to exercise with the program and follow exercise prescription    Comments Pt denies any psy/soc barriers or concerns    Expected Outcomes For Geroge to particiapte in PR without any psy/soc barriers    Continue Psychosocial Services  No Follow up required             Psychosocial Re-Evaluation:  Psychosocial Re-Evaluation     Row Name 11/16/23 0949             Psychosocial Re-Evaluation   Current issues with None Identified       Comments Anthony [name] denies any psychosocial barriers or concerns at this time.       Expected Outcomes For Anthony [name] to participate in PR free of any psychosocial barriers or concerns.       Interventions Encouraged to attend Pulmonary Rehabilitation for the exercise       Continue Psychosocial Services  No Follow up required                Psychosocial Discharge (Final Psychosocial Re-Evaluation):  Psychosocial Re-Evaluation - 11/16/23 0949       Psychosocial Re-Evaluation   Current issues with None Identified    Comments Anthony [name] denies any psychosocial barriers or concerns at this time.    Expected Outcomes For Anthony [name] to participate in PR free of any psychosocial barriers or concerns.    Interventions Encouraged to attend Pulmonary Rehabilitation for the exercise    Continue Psychosocial Services  No Follow up required             Education: Education Goals: Education classes will be provided on a weekly basis, covering required topics. Participant will state understanding/return demonstration of topics  presented.  Learning Barriers/Preferences:  Learning Barriers/Preferences - 11/02/23 1218       Learning Barriers/Preferences   Learning Barriers None    Learning Preferences Written Material;Group Instruction             Education Topics: Know Your Numbers Group instruction that is supported by a PowerPoint presentation. Instructor discusses importance of knowing and understanding resting, exercise, and post-exercise oxygen saturation, heart rate, and blood pressure. Oxygen saturation, heart rate, blood pressure, rating of perceived exertion, and dyspnea are reviewed along with a normal range for these values.    Exercise for the Pulmonary Patient Group instruction that is supported by a PowerPoint presentation. Instructor discusses benefits of exercise, core components of exercise, frequency, duration, and intensity of an exercise routine, importance of utilizing pulse oximetry during exercise, safety while exercising, and options of places to exercise outside of rehab.    MET Level  Group instruction provided by PowerPoint, verbal discussion, and written material to support subject matter. Instructor reviews what METs are and how to increase METs.  Flowsheet Row PULMONARY REHAB CHRONIC OBSTRUCTIVE PULMONARY  DISEASE from 11/17/2023 in [facility]  Date 11/17/23  Educator EP  Instruction Review Code 1- Verbalizes Understanding       Pulmonary Medications Verbally interactive group education provided by instructor with focus on inhaled medications and proper administration.   Anatomy and Physiology of the Respiratory System Group instruction provided by PowerPoint, verbal discussion, and written material to support subject matter. Instructor reviews respiratory cycle and anatomical components of the respiratory system and their functions. Instructor also reviews differences in obstructive and restrictive respiratory  diseases with examples of each.    Oxygen Safety Group instruction provided by PowerPoint, verbal discussion, and written material to support subject matter. There is an overview of "What is Oxygen" and "Why do we need it".  Instructor also reviews how to create a safe environment for oxygen use, the importance of using oxygen as prescribed, and the risks of noncompliance. There is a brief discussion on traveling with oxygen and resources the patient may utilize.   Oxygen Use Group instruction provided by PowerPoint, verbal discussion, and written material to discuss how supplemental oxygen is prescribed and different types of oxygen supply systems. Resources for more information are provided.    Breathing Techniques Group instruction that is supported by demonstration and informational handouts. Instructor discusses the benefits of pursed lip and diaphragmatic breathing and detailed demonstration on how to perform both.     Risk Factor Reduction Group instruction that is supported by a PowerPoint presentation. Instructor discusses the definition of a risk factor, different risk factors for pulmonary disease, and how the heart and lungs work together. Flowsheet Row PULMONARY REHAB CHRONIC OBSTRUCTIVE PULMONARY DISEASE from 11/10/2023 in [facility]  Date 11/10/23  Educator EP  Instruction Review Code 1- Verbalizes Understanding       Pulmonary Diseases Group instruction provided by PowerPoint, verbal discussion, and written material to support subject matter. Instructor gives an overview of the different type of pulmonary diseases. There is also a discussion on risk factors and symptoms as well as ways to manage the diseases.   Stress and Energy Conservation Group instruction provided by PowerPoint, verbal discussion, and written material to support subject matter. Instructor gives an overview of stress and the impact it can  have on the body. Instructor also reviews ways to reduce stress. There is also a discussion on energy conservation and ways to conserve energy throughout the day.   Warning Signs and Symptoms Group instruction provided by PowerPoint, verbal discussion, and written material to support subject matter. Instructor reviews warning signs and symptoms of stroke, heart attack, cold and flu. Instructor also reviews ways to prevent the spread of infection.   Other Education Group or individual verbal, written, or video instructions that support the educational goals of the pulmonary rehab program.    Knowledge Questionnaire Score:  Knowledge Questionnaire Score - 11/02/23 1218       Knowledge Questionnaire Score   Pre Score 16/18             Core Components/Risk Factors/Patient Goals at Admission:  Personal Goals and Risk Factors at Admission - 11/02/23 1052       Core Components/Risk Factors/Patient Goals on Admission    Weight Management Weight Loss;Yes    Improve shortness of breath with ADL's Yes    Intervention Provide education, individualized exercise plan and daily activity instruction to help decrease symptoms of SOB with activities of daily living.  Expected Outcomes Short Term: Improve cardiorespiratory fitness to achieve a reduction of symptoms when performing ADLs;Long Term: Be able to perform more ADLs without symptoms or delay the onset of symptoms             Core Components/Risk Factors/Patient Goals Review:   Goals and Risk Factor Review     Row Name 11/16/23 0950             Core Components/Risk Factors/Patient Goals Review   Personal Goals Review Weight Management/Obesity;Improve shortness of breath with ADL's;Develop more efficient breathing techniques such as purse lipped breathing and diaphragmatic breathing and practicing self-pacing with activity.       Review Anthony [name] has only attended 3 sessions so far. Goal progressing for weight loss. Anthony [name] is  working with the staff dietitian to achieve his weight loss goals. Goal progressing on improving shortness of breath with ADL's. Goal progressing on developing more efficient breathing techniques such as purse lipped breathing and diaphragmatic breathing; and practicing self-pacing with activity.       Expected Outcomes To improve shortness of breath with ADL's. Develop more efficient breathing techniques such as purse lipped breathing and diaphragmatic breathing; and practicing self-pacing with activity and lose weight.                Core Components/Risk Factors/Patient Goals at Discharge (Final Review):   Goals and Risk Factor Review - 11/16/23 0950       Core Components/Risk Factors/Patient Goals Review   Personal Goals Review Weight Management/Obesity;Improve shortness of breath with ADL's;Develop more efficient breathing techniques such as purse lipped breathing and diaphragmatic breathing and practicing self-pacing with activity.    Review Anthony [name] has only attended 3 sessions so far. Goal progressing for weight loss. Anthony [name] is working with the staff dietitian to achieve his weight loss goals. Goal progressing on improving shortness of breath with ADL's. Goal progressing on developing more efficient breathing techniques such as purse lipped breathing and diaphragmatic breathing; and practicing self-pacing with activity.    Expected Outcomes To improve shortness of breath with ADL's. Develop more efficient breathing techniques such as purse lipped breathing and diaphragmatic breathing; and practicing self-pacing with activity and lose weight.             ITP Comments: Pt is making expected progress toward Pulmonary Rehab goals after completing 5 session(s). Recommend continued exercise, life style modification, education, and utilization of breathing techniques to increase stamina and strength, while also decreasing shortness of breath with exertion.  Dr. Mechele Collin is Medical  Director for Pulmonary Rehab at [facility].

## 2023-11-29 ENCOUNTER — Encounter (HOSPITAL_COMMUNITY)
Admission: RE | Admit: 2023-11-29 | Discharge: 2023-11-29 | Disposition: A | Discharge status: Home / Self Care | Payer: No Typology Code available for payment source | Source: Ambulatory Visit | Attending: Pulmonary Disease | Admitting: Pulmonary Disease

## 2023-11-29 DIAGNOSIS — Z5189 Encounter for other specified aftercare: Secondary | ICD-10-CM | POA: Insufficient documentation

## 2023-11-29 DIAGNOSIS — Z87891 Personal history of nicotine dependence: Secondary | ICD-10-CM | POA: Insufficient documentation

## 2023-11-29 DIAGNOSIS — J449 Chronic obstructive pulmonary disease, unspecified: Secondary | ICD-10-CM | POA: Diagnosis present

## 2023-11-29 NOTE — Progress Notes (Signed)
Daily Session Note  Patient Details  Name: Anthony [name] MRN: [id number] Date of Birth: 1946/01/22 Referring Provider:   Doristine Devoid Pulmonary Rehab Walk Test from 11/02/2023 in [facility]  Referring Provider Briones       Encounter Date: 11/29/2023  Check In:  Session Check In - 11/29/23 1137       Check-In   Supervising physician immediately available to respond to emergencies [facility] MD immediately available    Physician(s) Eligha Bridegroom, NP    Location [facility]    Staff Present Essie Hart, RN, Doris Cheadle, MS, ACSM-CEP, Exercise Physiologist;Randi Idelle Crouch BS, ACSM-CEP, Exercise Physiologist;David Makemson, MS, ACSM-CEP, CCRP, Exercise Physiologist    Virtual Visit No    Medication changes reported     No    Fall or balance concerns reported    No    Tobacco Cessation No Change    Warm-up and Cool-down Performed as group-led instruction    Resistance Training Performed Yes    VAD Patient? No    PAD/SET Patient? No      Pain Assessment   Currently in Pain? No/denies             Capillary Blood Glucose: No results found for this or any previous visit (from the past 24 hour(s)).    Social History   Tobacco Use  Smoking Status Former   Current packs/day: 0.00   Average packs/day: 2.0 packs/day for 28.0 years (56.0 ttl pk-yrs)   Types: Cigarettes   Start date: 08/03/1959   Quit date: 08/03/1987   Years since quitting: 36.3  Smokeless Tobacco Never    Goals Met:  Independence with exercise equipment Exercise tolerated well No report of concerns or symptoms today Strength training completed today  Goals Unmet:  Not Applicable  Comments: Service time is from 1009 to 1142    Dr. Mechele Collin is Medical Director for Pulmonary Rehab at [facility].

## 2023-12-01 ENCOUNTER — Encounter (HOSPITAL_COMMUNITY)
Admission: RE | Admit: 2023-12-01 | Discharge: 2023-12-01 | Disposition: A | Discharge status: Home / Self Care | Payer: No Typology Code available for payment source | Source: Ambulatory Visit | Attending: Pulmonary Disease | Admitting: Pulmonary Disease

## 2023-12-01 DIAGNOSIS — J449 Chronic obstructive pulmonary disease, unspecified: Secondary | ICD-10-CM

## 2023-12-01 DIAGNOSIS — Z5189 Encounter for other specified aftercare: Secondary | ICD-10-CM | POA: Diagnosis not present

## 2023-12-01 NOTE — Progress Notes (Signed)
Daily Session Note  Patient Details  Name: Anthony [name] MRN: [id number] Date of Birth: 04-Aug-1946 Referring Provider:   Doristine Devoid Pulmonary Rehab Walk Test from 11/02/2023 in [facility] for [facility], [facility], [facility]  Referring Provider Briones       Encounter Date: 12/01/2023  Check In:  Session Check In - 12/01/23 1101       Check-In   Supervising physician immediately available to respond to emergencies [facility] MD immediately available    Physician(s) Edd Fabian, NP    Location [facility]    Staff Present Essie Hart, RN, Doris Cheadle, MS, ACSM-CEP, Exercise Physiologist;Dinah Lupa Dionisio Paschal, ACSM-CEP, Exercise Physiologist;Casey Katrinka Blazing, RT    Virtual Visit No    Medication changes reported     No    Fall or balance concerns reported    No    Tobacco Cessation No Change    Warm-up and Cool-down Performed as group-led instruction    Resistance Training Performed Yes    VAD Patient? No    PAD/SET Patient? No      Pain Assessment   Currently in Pain? No/denies    Multiple Pain Sites No             Capillary Blood Glucose: No results found for this or any previous visit (from the past 24 hour(s)).    Social History   Tobacco Use  Smoking Status Former   Current packs/day: 0.00   Average packs/day: 2.0 packs/day for 28.0 years (56.0 ttl pk-yrs)   Types: Cigarettes   Start date: 08/03/1959   Quit date: 08/03/1987   Years since quitting: 36.3  Smokeless Tobacco Never    Goals Met:  Independence with exercise equipment Exercise tolerated well No report of concerns or symptoms today Strength training completed today  Goals Unmet:  Not Applicable  Comments: Service time is from 1010 to 1142.    Dr. Mechele Collin is Medical Director for Pulmonary Rehab at [facility].

## 2023-12-05 ENCOUNTER — Encounter: Payer: Self-pay | Admitting: Diagnostic Neuroimaging

## 2023-12-05 ENCOUNTER — Ambulatory Visit (INDEPENDENT_AMBULATORY_CARE_PROVIDER_SITE_OTHER): Payer: Medicare Other | Admitting: Diagnostic Neuroimaging

## 2023-12-05 VITALS — BP 117/63 | HR 66 | Ht 68.0 in | Wt 231.0 lb

## 2023-12-05 DIAGNOSIS — I6381 Other cerebral infarction due to occlusion or stenosis of small artery: Secondary | ICD-10-CM | POA: Diagnosis not present

## 2023-12-05 DIAGNOSIS — E1165 Type 2 diabetes mellitus with hyperglycemia: Secondary | ICD-10-CM | POA: Diagnosis not present

## 2023-12-05 DIAGNOSIS — Z794 Long term (current) use of insulin: Secondary | ICD-10-CM

## 2023-12-05 DIAGNOSIS — G4733 Obstructive sleep apnea (adult) (pediatric): Secondary | ICD-10-CM

## 2023-12-05 DIAGNOSIS — I10 Essential (primary) hypertension: Secondary | ICD-10-CM | POA: Diagnosis not present

## 2023-12-05 NOTE — Progress Notes (Signed)
[facility]  PATIENT: Anthony [name] DOB: December 04, 1946  REFERRING CLINICIAN: Creola Corn, MD HISTORY FROM: patient  REASON FOR VISIT: new consult   HISTORICAL  CHIEF COMPLAINT:  Chief Complaint  Patient presents with   New Patient (Initial Visit)    Patient in room #6 with his wife. Patient states he is well and stable, with no new concerns.    HISTORY OF PRESENT ILLNESS:   77 year old male here for evaluation of poststroke hospital follow-up.  History of hypertension, hyperlipidemia, diabetes, sleep apnea.  Presented to hospital with transient confusion and slurred speech.  Symptoms started to improve after arriving to emergency room and resolved within less than a day.  He was diagnosed with acute left thalamic ischemic infarction.  Stroke workup completed.  Has been medically managed on aspirin and Plavix.  Overall doing well.  Back to baseline.   REVIEW OF SYSTEMS: Full 14 system review of systems performed and negative with exception of: as per HPI.  ALLERGIES: Allergies  Allergen Reactions   Spiriva Respimat [Tiotropium Bromide Monohydrate] Other (See Comments)    Throat irritation    HOME MEDICATIONS: Outpatient Medications Prior to Visit  Medication Sig Dispense Refill   albuterol (VENTOLIN HFA) 108 (90 Base) MCG/ACT inhaler Inhale 2 puffs into the lungs every 6 (six) hours as needed for wheezing or shortness of breath.     ASPIRIN LOW DOSE 81 MG EC tablet Take 81 mg by mouth at bedtime.     b complex vitamins capsule Take 1 capsule by mouth daily.     BENFOTIAMINE PO Take 1 tablet by mouth 2 (two) times daily.     bisoprolol (ZEBETA) 5 MG tablet Take 1 tablet by mouth every evening.     carboxymethylcellulose 1 % ophthalmic solution Apply 1 drop to eye 3 (three) times daily.     CINNAMON PO Take 1 tablet by mouth daily.     clopidogrel (PLAVIX) 75 MG tablet Take 1 tablet (75 mg total) by mouth daily. 90 tablet 0   doxazosin (CARDURA) 2 MG  tablet Take 2 mg by mouth every evening.     doxycycline (ADOXA) 50 MG tablet Take 50 mg by mouth daily at 12 noon.     empagliflozin (JARDIANCE) 25 MG TABS tablet Take 12.5 mg by mouth daily.     fluticasone (FLONASE) 50 MCG/ACT nasal spray Place 2 sprays into both nostrils at bedtime as needed for allergies or rhinitis.      furosemide (LASIX) 20 MG tablet Take 20 mg by mouth daily at 12 noon.      gabapentin (NEURONTIN) 100 MG capsule Take 200 mg by mouth every evening.     GARLIC PO Take 1 tablet by mouth daily.     hydrocortisone 2.5 % ointment Apply 1 Application topically daily as needed (skin irritation on face).     insulin glargine-yfgn (SEMGLEE, YFGN,) 100 UNIT/ML Pen Inject 15 Units into the skin every evening.     Insulin Pen Needle (PEN NEEDLES) 32G X 4 MM MISC Inject q daily with Rx of Lantus DxE-11.22 for 90 days     isosorbide mononitrate (IMDUR) 30 MG 24 hr tablet TAKE 1 TABLET BY MOUTH  DAILY 90 tablet 3   L-LYSINE PO Take 1 tablet by mouth daily.     loratadine (CLARITIN) 10 MG tablet Take 10 mg by mouth daily.     losartan (COZAAR) 100 MG tablet Take 100 mg by mouth daily.     metFORMIN (GLUCOPHAGE-XR) 500  MG 24 hr tablet Take 1,000 mg by mouth 2 (two) times daily.     Mometasone Furoate 200 MCG/ACT AERO Take 200 mcg by mouth daily.     Multiple Vitamin (MULTIVITAMIN) tablet Take 1 tablet by mouth daily.     nitroGLYCERIN (NITROSTAT) 0.4 MG SL tablet Place 1 tablet (0.4 mg total) under the tongue every 5 (five) minutes as needed for chest pain. 90 tablet 3   Omega-3 Fatty Acids (FISH OIL PO) Take 1 capsule by mouth 2 (two) times daily.     pentoxifylline (TRENTAL) 400 MG CR tablet TAKE 1 TABLET BY MOUTH  TWICE DAILY 180 tablet 3   potassium chloride SA (KLOR-CON M) 20 MEQ tablet Take 20 mEq by mouth daily.     Probiotic Product (PROBIOTIC PO) Take 1 capsule by mouth every evening.     rOPINIRole (REQUIP) 1 MG tablet Take 1 mg by mouth every evening.      Semaglutide, 2  MG/DOSE, (OZEMPIC, 2 MG/DOSE,) 8 MG/3ML SOPN Inject 2 mg into the skin See admin instructions. Inject 2 mg once a week on Thursdays.     simvastatin (ZOCOR) 40 MG tablet Take 40 mg by mouth every evening.     triamcinolone ointment (KENALOG) 0.1 % Apply 1 Application topically 2 (two) times daily.     umeclidinium-vilanterol (ANORO ELLIPTA) 62.5-25 MCG/INH AEPB INHALE 1 INHALATION BY  MOUTH INTO THE LUNGS DAILY 180 each 3   No facility-administered medications prior to visit.    PAST MEDICAL HISTORY: Past Medical History:  Diagnosis Date   Atherosclerosis of native artery of both lower extremities with intermittent claudication (HCC)    followed by vascular , dr Myra Gianotti;  s/p angioplasty and stenting bilaterally 2015 and 11/ 2021 (per lov note 06-08-2021 bilateral iliofemoral intervention's are widely patent)   CAD (coronary artery disease)    cardiologist-- dr Gala Romney;  s/p stent placed 06/02/2000 in [place];  s/p cath with patent LCx , PCI w/ DES to pLAD and PCI to POBA of D1   Chronic diastolic (congestive) heart failure (HCC)    followed by cardiology   COPD (chronic obstructive pulmonary disease) with chronic bronchitis [facility])    pulmonology-- dr Craige Cotta--  (09-23-2021  per pt checks O2 stats at home, [place] 96-97% on RA)   Edema of left lower extremity    per pt since vasuclar surgery 11/ 2021, but told mild   History of COVID-19 06/2021   per pt mild symtpoms that resolved   Hyperlipidemia    Hypertension    followed by cardiology and pcp   OA (osteoarthritis)    OSA on CPAP    followed by dr Craige Cotta--  study in epic 08-23-2012 severe osa (no additional oxygen)   PAD (peripheral artery disease) (HCC)    Peripheral neuropathy    feet   Peripheral vascular disease (HCC)    Rosacea    S/P drug eluting coronary stent placement    2001  x1 stent (unsure if DES);  05/ 2020 DES to pLAD   Shortness of breath    with exertion with stairs but recovers quickly,  ok w/ household chores    Type 2 diabetes mellitus (HCC)    followed by pcp   (09-23-2021 per pt check blood sugar at home twice weekly in am,  fasting sugar-- 170)    PAST SURGICAL HISTORY: Past Surgical History:  Procedure Laterality Date   ABDOMINAL AORTAGRAM N/A 11/29/2012   Procedure: ABDOMINAL Ronny Flurry;  Surgeon: Nada Libman, MD;  [facility]: [facility] C[facility];  Service: Cardiovascular;  Laterality: N/A;   ABDOMINAL AORTAGRAM N/A 09/03/2014   Procedure: ABDOMINAL Ronny Flurry;  Surgeon: Nada Libman, MD;  Location: [facility] CATH LAB;  Service: Cardiovascular;  Laterality: N/A;   ABDOMINAL AORTOGRAM W/LOWER EXTREMITY N/A 09/16/2020   Procedure: ABDOMINAL AORTOGRAM W/LOWER EXTREMITY;  Surgeon: Nada Libman, MD;  [facility]: [facility] INVASIVE CV LAB;  Service: Cardiovascular;  Laterality: N/A;   ANTERIOR CERVICAL DECOMP/DISCECTOMY FUSION  10/2001   C5--C7   ANTERIOR LATERAL LUMBAR FUSION 4 LEVELS Right 05/28/2014   Procedure: Right L4-5 L3-4 L2-3, L1-2  Anterior lateral lumbar fusion with percutaneaous pedicle screws. Lumbar four/five, three/four, two/three and possible two/one;  Surgeon: Maeola Harman, MD;  Location: [facility];  Service: Neurosurgery;  Laterality: Right;  Lumbar One-Five Fusion with Percutaneous Screws   BUNIONECTOMY Right    1991 and 1994   CARDIAC CATHETERIZATION  03/26/1999   in [place];  per pt medically managed , no intervention   CARPAL TUNNEL RELEASE Right 09/30/2006   CATARACT EXTRACTION W/ INTRAOCULAR LENS IMPLANT Bilateral 2014   COLONOSCOPY  08/26/2020   CORONARY ANGIOPLASTY WITH STENT PLACEMENT  06/02/2000   in [place]; x1 stent   CORONARY STENT INTERVENTION N/A 05/11/2019   Procedure: CORONARY STENT INTERVENTION;  Surgeon: Kathleene Hazel, MD;  [facility];  Service: Cardiovascular;  Laterality: N/A;   ENDARTERECTOMY FEMORAL Right 10/10/2014   Procedure: RIGHT FEMORAL ARTERY ENDARTERECTOMY  WITH VASCU GUARD PATCH ANGIOPLASTY;  Surgeon: Nada Libman, MD;   Location: [facility] OR;  Service: Vascular;  Laterality: Right;   ENDARTERECTOMY FEMORAL Left 11/05/2020   Procedure: LEFT FEMORAL ENDARTERECTOMY WITH PATCH ANGIOPLASTY;  Surgeon: Nada Libman, MD;  Location: [facility] OR;  Service: Vascular;  Laterality: Left;   FEMORAL ENDARTERECTOMY Left 11/05/2020   LEFT FEMORAL ENDARTERECTOMY WITH PATCH ANGIOPLASTY (Left    FINGER ARTHRODESIS Right 09/2009   right thumb   HAMMER TOE SURGERY Left    06/ 2007 and 08/ 2008   HYDRADENITIS EXCISION N/A 09/25/2021   Procedure: EXCISION HIDRADENITIS, INTERROGATION OF PERINEAL WOUND;  Surgeon: Andria Meuse, MD;  Location: [facility];  Service: General;  Laterality: N/A;   INSERTION OF ILIAC STENT Left 11/05/2020    INSERTION OF ILIAC STENT (Left )   INSERTION OF ILIAC STENT Left 11/05/2020   Procedure: INSERTION OF ILIAC STENT;  Surgeon: Nada Libman, MD;  [facility];  Service: Vascular;  Laterality: Left;   LUMBAR PERCUTANEOUS PEDICLE SCREW 4 LEVEL N/A 05/28/2014   Procedure: LUMBAR PERCUTANEOUS PEDICLE SCREW 4 LEVEL;  Surgeon: Maeola Harman, MD;  [facility];  Service: Neurosurgery;  Laterality: N/A;   LUMBAR SPINE SURGERY     12/ 1997 and 04/ 2005   NEUROMA SURGERY Right 1996   right foot   PERIPHERAL INTRAVASCULAR LITHOTRIPSY Left 11/05/2020   Procedure: INTRAVASCULAR LITHOTRIPSY;  Surgeon: Nada Libman, MD;  Location: [facility] OR;  Service: Vascular;  Laterality: Left;  shockwave   PERIPHERAL VASCULAR CATHETERIZATION N/A 12/07/2016   Procedure: Abdominal Aortogram w/Lower Extremity;  Surgeon: Nada Libman, MD;  [facility];  Service: Cardiovascular;  Laterality: N/A;   RECTAL EXAM UNDER ANESTHESIA N/A 09/25/2021   Procedure: ANORECTAL EXAM UNDER ANESTHESIA;  Surgeon: Andria Meuse, MD;  Location: [facility];  Service: General;  Laterality: N/A;   RIGHT/LEFT HEART CATH AND CORONARY ANGIOGRAPHY N/A 05/11/2019   Procedure: RIGHT/LEFT  HEART CATH AND CORONARY ANGIOGRAPHY;  Surgeon: Gala Romney,  Bevelyn Buckles, MD;  Location: [facility] INVASIVE CV LAB;  Service: Cardiovascular;  Laterality: N/A;   TONSILLECTOMY  1965    FAMILY HISTORY: Family History  Problem Relation Age of Onset   Heart disease Father 70   Cancer Father    Hyperlipidemia Father    Hypertension Father    Heart attack Father    Cancer Mother    Deep vein thrombosis Mother        Varicose veins   Diabetes Mother    Hyperlipidemia Mother    Hypertension Mother     SOCIAL HISTORY: Social History   Socioeconomic History   Marital status: Married    Spouse name: Mindi Junker   Number of children: 1   Years of education: 13   Highest education level: Not on file  Occupational History   Occupation: retired  Tobacco Use   Smoking status: Former    Current packs/day: 0.00    Average packs/day: 2.0 packs/day for 28.0 years (56.0 ttl pk-yrs)    Types: Cigarettes    Start date: 08/03/1959    Quit date: 08/03/1987    Years since quitting: 36.3   Smokeless tobacco: Never  Vaping Use   Vaping status: Never Used  Substance and Sexual Activity   Alcohol use: Yes    Comment: socially   Drug use: No   Sexual activity: Yes  Other Topics Concern   Not on file  Social History Narrative   Spend time with family, 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, Engineering geologist)   Social Determinants of Health   Financial Resource Strain: Not on file  Food Insecurity: No Food Insecurity (10/05/2023)   Hunger Vital Sign    Worried About Running Out of Food in the Last Year: Never true    Ran Out of Food in the Last Year: Never true  Transportation Needs: No Transportation Needs (10/05/2023)   PRAPARE - Administrator, Civil Service (Medical): No    Lack of Transportation (Non-Medical): No  Physical Activity: Not on file  Stress: Not on file  Social Connections: Not on file  Intimate Partner Violence: Not At Risk (10/05/2023)   Humiliation, Afraid, Rape, and Kick questionnaire     Fear of Current or Ex-Partner: No    Emotionally Abused: No    Physically Abused: No    Sexually Abused: No     PHYSICAL EXAM  GENERAL EXAM/CONSTITUTIONAL: Vitals:  Vitals:   12/05/23 1021  BP: 117/63  Pulse: 66  Weight: 231 lb (104.8 kg)  Height: 5\' 8"  (1.727 m)   Body mass index is 35.12 kg/m. Wt Readings from Last 3 Encounters:  12/05/23 231 lb (104.8 kg)  11/22/23 229 lb 4.5 oz (104 kg)  11/08/23 229 lb 8 oz (104.1 kg)   Patient is in no distress; well developed, nourished and groomed; neck is supple  CARDIOVASCULAR: Examination of carotid arteries is normal; no carotid bruits Regular rate and rhythm, no murmurs Examination of peripheral vascular system by observation and palpation is normal  EYES: Ophthalmoscopic exam of optic discs and posterior segments is normal; no papilledema or hemorrhages No results found.  MUSCULOSKELETAL: Gait, strength, tone, movements noted in Neurologic exam below  NEUROLOGIC: MENTAL STATUS:      No data to display         awake, alert, oriented to person, place and time recent and remote memory intact normal attention and concentration language fluent, comprehension intact, naming intact fund of knowledge appropriate  CRANIAL NERVE:  2nd -  no papilledema on fundoscopic exam 2nd, 3rd, 4th, 6th - pupils equal and reactive to light, visual fields full to confrontation, extraocular muscles intact, no nystagmus 5th - facial sensation symmetric 7th - facial strength symmetric 8th - hearing intact 9th - palate elevates symmetrically, uvula midline 11th - shoulder shrug symmetric 12th - tongue protrusion midline  MOTOR:  normal bulk and tone, full strength in the BUE, BLE  SENSORY:  normal and symmetric to light touch, pinprick, temperature, vibration  COORDINATION:  finger-nose-finger, fine finger movements normal  REFLEXES:  deep tendon reflexes present and symmetric  GAIT/STATION:  narrow based gait; able to  walk on toes, heels and tandem; romberg is negative     DIAGNOSTIC DATA (LABS, IMAGING, TESTING) - I reviewed patient records, labs, notes, testing and imaging myself where available.  Lab Results  Component Value Date   WBC 5.6 10/06/2023   HGB 14.5 10/06/2023   HCT 44.5 10/06/2023   MCV 91.6 10/06/2023   PLT 140 (L) 10/06/2023      Component Value Date/Time   NA 138 10/06/2023 0706   K 4.3 10/06/2023 0706   CL 107 10/06/2023 0706   CO2 22 10/06/2023 0706   GLUCOSE 165 (H) 10/06/2023 0706   BUN 29 (H) 10/06/2023 0706   CREATININE 1.22 10/06/2023 0706   CALCIUM 9.1 10/06/2023 0706   PROT 6.9 10/04/2023 1536   ALBUMIN 4.2 10/04/2023 1536   AST 20 10/04/2023 1536   ALT 15 10/04/2023 1536   ALKPHOS 55 10/04/2023 1536   BILITOT 0.7 10/04/2023 1536   GFRNONAA >60 10/06/2023 0706   GFRAA 59 (L) 09/16/2020 0621   Lab Results  Component Value Date   CHOL 126 10/05/2023   HDL 44 10/05/2023   LDLCALC 51 10/05/2023   TRIG 154 (H) 10/05/2023   CHOLHDL 2.9 10/05/2023   Lab Results  Component Value Date   HGBA1C 7.7 (H) 10/04/2023   No results found for: "VITAMINB12" No results found for: "TSH"  10/05/23 TTE  1. Left ventricular ejection fraction, by estimation, is 55 to 60%. The  left ventricle has normal function. The left ventricle has no regional  wall motion abnormalities. There is mild concentric left ventricular  hypertrophy. Left ventricular diastolic  parameters were normal.   2. Right ventricular systolic function is normal. The right ventricular  size is normal. Tricuspid regurgitation signal is inadequate for assessing  PA pressure.   3. Left atrial size was mildly dilated.   4. The mitral valve is normal in structure. Trivial mitral valve  regurgitation. No evidence of mitral stenosis. Moderate mitral annular  calcification.   5. The aortic valve is tricuspid. There is mild calcification of the  aortic valve. Aortic valve regurgitation is trivial. No  aortic stenosis is  present.   6. The inferior vena cava is normal in size with greater than 50%  respiratory variability, suggesting right atrial pressure of 3 mmHg.   Comparison(s): No significant change from prior study.    ASSESSMENT AND PLAN  77 y.o. year old male here with hypertension, hyperlipidemia, diabetes, obstructive sleep apnea here for evaluation of hospital stroke follow-up.   Dx:  1. Thalamic stroke (HCC)   2. Type 2 diabetes mellitus with hyperglycemia, with long-term current use of insulin (HCC)   3. Hypertension, unspecified type   4. OSA (obstructive sleep apnea)      PLAN:  left thalamic ischemic infarction (10/04/23) --> Etiology: small vessel thrombosis Code Stroke  CT head No acute abnormality.  Atrophy.  ASPECTS 10.     CTA head & neck occlusion versus critical stenosis of proximal right vertebral artery, 65% stenosis of right ICA, mild to moderate bilateral intracranial ICAs and intradural vertebral artery stenosis CT perfusion Area of reported mismatch/penumbra in the superior left cerebellum and posterior left brainstem. It is unclear if this represents true tissue risk or artifact.  MRI left thalamic infarct   2D Echo EF 55-60% LDL 51 HgbA1c 7.7 VTE prophylaxis -Lovenox aspirin 81 mg daily prior to admission, now on aspirin 81 mg daily and clopidogrel 75 mg daily for 90 days and then Plavix alone.   Hypertension CHF CAD Home meds: Lasix, Imdur, losartan   Hyperlipidemia Home meds: Zetia, simvastatin, resumed in hospital LDL 51, goal < 70 Continue statin at discharge   Diabetes type II UnControlled Home meds: Insulin, metformin, Jardiance HgbA1c 7.7, goal < 7.0 CBGs SSI Recommend close follow-up with PCP for better DM control   Other Stroke Risk Factors  ETOH use, alcohol level <10, advised to drink no more than 2 drink(s) a day  Obesity, Body mass index is 36.49 kg/m., BMI >/= 30 associated with increased stroke risk, recommend  weight loss, diet and exercise as appropriate     Coronary artery disease  Congestive heart failure  Obstructive sleep apnea, on CPAP at home  Return for return to PCP, pending if symptoms worsen or fail to improve.    Suanne Marker, MD 12/05/2023, 10:49 AM Certified in Neurology, Neurophysiology and Neuroimaging  [facility] [address], [address] [place], [place] [address] [phone]

## 2023-12-05 NOTE — Patient Instructions (Signed)
-   aspirin 81 mg daily and clopidogrel 75 mg daily for 90 days and then clopidogrel 75mg  alone

## 2023-12-06 ENCOUNTER — Encounter (HOSPITAL_COMMUNITY): Payer: Self-pay

## 2023-12-06 ENCOUNTER — Encounter (HOSPITAL_COMMUNITY)
Admission: RE | Admit: 2023-12-06 | Discharge: 2023-12-06 | Disposition: A | Discharge status: Home / Self Care | Payer: No Typology Code available for payment source | Source: Ambulatory Visit | Attending: Pulmonary Disease | Admitting: Pulmonary Disease

## 2023-12-06 VITALS — Wt 230.6 lb

## 2023-12-06 DIAGNOSIS — J449 Chronic obstructive pulmonary disease, unspecified: Secondary | ICD-10-CM

## 2023-12-06 DIAGNOSIS — Z5189 Encounter for other specified aftercare: Secondary | ICD-10-CM | POA: Diagnosis not present

## 2023-12-06 NOTE — Progress Notes (Signed)
Daily Session Note  Patient Details  Name: DERRELLE BURDITT MRN: [id number] Date of Birth: 1946-09-14 Referring Provider:   Doristine Devoid Pulmonary Rehab Walk Test from 11/02/2023 in [facility]  Referring Provider Briones       Encounter Date: 12/06/2023  Check In:  Session Check In - 12/06/23 1025       Check-In   Supervising physician immediately available to respond to emergencies [facility] MD immediately available    Physician(s) Carlos Levering, NP    Location [facility]    Staff Present Essie Hart, RN, Doris Cheadle, MS, ACSM-CEP, Exercise Physiologist;Randi Dionisio Paschal, ACSM-CEP, Exercise Physiologist;Casey Katrinka Blazing, RT    Virtual Visit No    Medication changes reported     No    Fall or balance concerns reported    No    Tobacco Cessation No Change    Warm-up and Cool-down Performed as group-led instruction    Resistance Training Performed Yes    VAD Patient? No    PAD/SET Patient? No      Pain Assessment   Currently in Pain? No/denies    Multiple Pain Sites No             Capillary Blood Glucose: No results found for this or any previous visit (from the past 24 hour(s)).   Exercise Prescription Changes - 12/06/23 1100       Response to Exercise   Blood Pressure (Admit) 106/60    Blood Pressure (Exercise) 116/60    Blood Pressure (Exit) 90/56    Heart Rate (Admit) 69 bpm    Heart Rate (Exercise) 92 bpm    Heart Rate (Exit) 77 bpm    Oxygen Saturation (Admit) 93 %    Oxygen Saturation (Exercise) 93 %    Oxygen Saturation (Exit) 93 %    Rating of Perceived Exertion (Exercise) 13    Perceived Dyspnea (Exercise) 3    Duration Continue with 30 min of aerobic exercise without signs/symptoms of physical distress.    Intensity THRR unchanged      Progression   Progression Continue to progress workloads to maintain intensity without signs/symptoms of physical distress.       Resistance Training   Training Prescription Yes    Weight blue bands    Reps 10-15    Time 10 Minutes      NuStep   Level 3    Minutes 15    METs 2.6      Track   Laps 8    Minutes 15    METs 2.23             Social History   Tobacco Use  Smoking Status Former   Current packs/day: 0.00   Average packs/day: 2.0 packs/day for 28.0 years (56.0 ttl pk-yrs)   Types: Cigarettes   Start date: 08/03/1959   Quit date: 08/03/1987   Years since quitting: 36.3  Smokeless Tobacco Never    Goals Met:  Independence with exercise equipment Exercise tolerated well No report of concerns or symptoms today Strength training completed today  Goals Unmet:  Pt's BP runs low, 90/56, pt asymptomatic  Comments: Service time is from 1010 to 1141    Dr. Mechele Collin is Medical Director for Pulmonary Rehab at [facility].

## 2023-12-08 ENCOUNTER — Encounter (HOSPITAL_COMMUNITY)
Admission: RE | Admit: 2023-12-08 | Discharge: 2023-12-08 | Disposition: A | Discharge status: Home / Self Care | Payer: No Typology Code available for payment source | Source: Ambulatory Visit | Attending: Pulmonary Disease | Admitting: Pulmonary Disease

## 2023-12-08 DIAGNOSIS — Z5189 Encounter for other specified aftercare: Secondary | ICD-10-CM | POA: Diagnosis not present

## 2023-12-08 DIAGNOSIS — J449 Chronic obstructive pulmonary disease, unspecified: Secondary | ICD-10-CM

## 2023-12-08 NOTE — Progress Notes (Signed)
Daily Session Note  Patient Details  Name: Anthony [name] MRN: [id number] Date of Birth: 1946-03-05 Referring Provider:   Doristine Devoid Pulmonary Rehab Walk Test from 11/02/2023 in [facility]  Referring Provider Briones       Encounter Date: 12/08/2023  Check In:  Session Check In - 12/08/23 1108       Check-In   Supervising physician immediately available to respond to emergencies [facility] MD immediately available    Physician(s) Joni Reining, NP    Location [facility]    Staff Present Essie Hart, RN, Doris Cheadle, MS, ACSM-CEP, Exercise Physiologist;Randi Dionisio Paschal, ACSM-CEP, Exercise Physiologist;Davionne Dowty Katrinka Blazing, RT    Virtual Visit No    Medication changes reported     No    Fall or balance concerns reported    No    Tobacco Cessation No Change    Warm-up and Cool-down Performed as group-led instruction    Resistance Training Performed Yes    VAD Patient? No    PAD/SET Patient? No      Pain Assessment   Currently in Pain? No/denies    Multiple Pain Sites No             Capillary Blood Glucose: No results found for this or any previous visit (from the past 24 hours).    Social History   Tobacco Use  Smoking Status Former   Current packs/day: 0.00   Average packs/day: 2.0 packs/day for 28.0 years (56.0 ttl pk-yrs)   Types: Cigarettes   Start date: 08/03/1959   Quit date: 08/03/1987   Years since quitting: 36.3  Smokeless Tobacco Never    Goals Met:  Proper associated with RPD/PD & O2 Sat Independence with exercise equipment Exercise tolerated well No report of concerns or symptoms today Strength training completed today  Goals Unmet:  Not Applicable  Comments: Service time is from 1007 to 1132.    Dr. Mechele Collin is Medical Director for Pulmonary Rehab at [facility].

## 2023-12-12 NOTE — Progress Notes (Signed)
Home Exercise Prescription I have reviewed a Home Exercise Prescription with Anthony [name]. Anthony [name] is not currently exercising at home. He access to a fitness center at his home. I encouraged Anthony [name] to use the fitness center and exercise on the bike or other aerobic equipment at least 1 non-rehab day/wk for 30 min/day. He agreed with my recommendations. I asked Anthony [name] to show me a picture of the fitness center so I could advise on what equipment would benefit him the best. Will follow up on exercise equipment. Anthony [name] seems motivated to exercise and decrease his shortness of breath. The patient stated that their goals were to decrease shortness of breath. We reviewed exercise guidelines, target heart rate during exercise, RPE Scale, weather conditions, endpoints for exercise, warmup and cool down. The patient is encouraged to come to me with any questions. I will continue to follow up with the patient to assist them with progression and safety. Spent 15 min with patient discussing home exercise plan and goals  Anthony San, MS, ACSM-CEP 12/12/2023 9:51 AM

## 2023-12-13 ENCOUNTER — Encounter (HOSPITAL_COMMUNITY)
Admission: RE | Admit: 2023-12-13 | Discharge: 2023-12-13 | Disposition: A | Discharge status: Home / Self Care | Payer: No Typology Code available for payment source | Source: Ambulatory Visit | Attending: Pulmonary Disease | Admitting: Pulmonary Disease

## 2023-12-13 DIAGNOSIS — J449 Chronic obstructive pulmonary disease, unspecified: Secondary | ICD-10-CM

## 2023-12-13 DIAGNOSIS — Z5189 Encounter for other specified aftercare: Secondary | ICD-10-CM | POA: Diagnosis not present

## 2023-12-13 NOTE — Progress Notes (Signed)
Daily Session Note  Patient Details  Name: Anthony [name] MRN: [id number] Date of Birth: [birth year] Referring Provider:   Doristine Devoid Pulmonary Rehab Walk Test from 11/02/2023 in [facility]  Referring Provider Briones       Encounter Date: 12/13/2023  Check In:  Session Check In - 12/13/23 1119       Check-In   Supervising physician immediately available to respond to emergencies [facility] MD immediately available    Physician(s) Bernadene Person, NP    Location [facility]    Staff Present Essie Hart, RN, Doris Cheadle, MS, ACSM-CEP, Exercise Physiologist;Maximo Spratling Dionisio Paschal, ACSM-CEP, Exercise Physiologist;Casey Katrinka Blazing, RT    Virtual Visit No    Medication changes reported     No    Fall or balance concerns reported    No    Tobacco Cessation No Change    Warm-up and Cool-down Performed as group-led instruction    Resistance Training Performed Yes    VAD Patient? No    PAD/SET Patient? No      Pain Assessment   Currently in Pain? No/denies    Multiple Pain Sites No             Capillary Blood Glucose: No results found for this or any previous visit (from the past 24 hours).    Social History   Tobacco Use  Smoking Status Former   Current packs/day: 0.00   Average packs/day: 2.0 packs/day for 28.0 years (56.0 ttl pk-yrs)   Types: Cigarettes   Start date: 08/03/1959   Quit date: 08/03/1987   Years since quitting: 36.3  Smokeless Tobacco Never    Goals Met:  Independence with exercise equipment Exercise tolerated well No report of concerns or symptoms today Strength training completed today  Goals Unmet:  Not Applicable  Comments: Service time is from 1007 to 1140.    Dr. Mechele Collin is Medical Director for Pulmonary Rehab at [facility].

## 2023-12-15 ENCOUNTER — Encounter (HOSPITAL_COMMUNITY)
Admission: RE | Admit: 2023-12-15 | Discharge: 2023-12-15 | Disposition: A | Discharge status: Home / Self Care | Payer: No Typology Code available for payment source | Source: Ambulatory Visit | Attending: Pulmonary Disease | Admitting: Pulmonary Disease

## 2023-12-15 DIAGNOSIS — J449 Chronic obstructive pulmonary disease, unspecified: Secondary | ICD-10-CM

## 2023-12-15 DIAGNOSIS — Z5189 Encounter for other specified aftercare: Secondary | ICD-10-CM | POA: Diagnosis not present

## 2023-12-15 NOTE — Progress Notes (Signed)
Daily Session Note  Patient Details  Name: Anthony [name] MRN: [id number] Date of Birth: [birth year] Referring Provider:   Doristine Devoid Pulmonary Rehab Walk Test from 11/02/2023 in [facility]  Referring Provider Briones       Encounter Date: 12/15/2023  Check In:  Session Check In - 12/15/23 1111       Check-In   Supervising physician immediately available to respond to emergencies [facility] MD immediately available    Physician(s) Robin Searing, NP    Location [facility]    Staff Present Essie Hart, RN, Doris Cheadle, MS, ACSM-CEP, Exercise Physiologist;Randi Dionisio Paschal, ACSM-CEP, Exercise Physiologist;Krysteena Stalker Katrinka Blazing, RT    Virtual Visit No    Medication changes reported     No    Fall or balance concerns reported    No    Tobacco Cessation No Change    Warm-up and Cool-down Performed as group-led instruction    Resistance Training Performed Yes    VAD Patient? No    PAD/SET Patient? No      Pain Assessment   Currently in Pain? No/denies    Multiple Pain Sites No             Capillary Blood Glucose: No results found for this or any previous visit (from the past 24 hours).    Social History   Tobacco Use  Smoking Status Former   Current packs/day: 0.00   Average packs/day: 2.0 packs/day for 28.0 years (56.0 ttl pk-yrs)   Types: Cigarettes   Start date: 08/03/1959   Quit date: 08/03/1987   Years since quitting: 36.3  Smokeless Tobacco Never    Goals Met:  Proper associated with RPD/PD & O2 Sat Independence with exercise equipment Exercise tolerated well No report of concerns or symptoms today Strength training completed today  Goals Unmet:  Not Applicable  Comments: Service time is from 1012 to 1142.    Dr. Mechele Collin is Medical Director for Pulmonary Rehab at [facility].

## 2023-12-20 ENCOUNTER — Encounter (HOSPITAL_COMMUNITY)
Admission: RE | Admit: 2023-12-20 | Discharge: 2023-12-20 | Disposition: A | Discharge status: Home / Self Care | Payer: No Typology Code available for payment source | Source: Ambulatory Visit | Attending: Pulmonary Disease | Admitting: Pulmonary Disease

## 2023-12-20 VITALS — Wt 229.3 lb

## 2023-12-20 DIAGNOSIS — J449 Chronic obstructive pulmonary disease, unspecified: Secondary | ICD-10-CM

## 2023-12-20 DIAGNOSIS — Z5189 Encounter for other specified aftercare: Secondary | ICD-10-CM | POA: Diagnosis not present

## 2023-12-20 NOTE — Progress Notes (Signed)
Pulmonary Individual Treatment Plan  Patient Details  Name: Anthony [name] MRN: [id number] Date of Birth: 06-08-1946 Referring Provider:   Doristine Devoid Pulmonary Rehab Walk Test from 11/02/2023 in [facility]  Referring Provider Briones       Initial Encounter Date:  Flowsheet Row Pulmonary Rehab Walk Test from 11/02/2023 in [facility]  Date 11/02/23       Visit Diagnosis: Stage 2 moderate COPD by GOLD classification (HCC)  Patient's Home Medications on Admission:   Current Outpatient Medications:    albuterol (VENTOLIN HFA) 108 (90 Base) MCG/ACT inhaler, Inhale 2 puffs into the lungs every 6 (six) hours as needed for wheezing or shortness of breath., Disp: , Rfl:    ASPIRIN LOW DOSE 81 MG EC tablet, Take 81 mg by mouth at bedtime., Disp: , Rfl:    b complex vitamins capsule, Take 1 capsule by mouth daily., Disp: , Rfl:    BENFOTIAMINE PO, Take 1 tablet by mouth 2 (two) times daily., Disp: , Rfl:    bisoprolol (ZEBETA) 5 MG tablet, Take 1 tablet by mouth every evening., Disp: , Rfl:    carboxymethylcellulose 1 % ophthalmic solution, Apply 1 drop to eye 3 (three) times daily., Disp: , Rfl:    CINNAMON PO, Take 1 tablet by mouth daily., Disp: , Rfl:    clopidogrel (PLAVIX) 75 MG tablet, Take 1 tablet (75 mg total) by mouth daily., Disp: 90 tablet, Rfl: 0   doxazosin (CARDURA) 2 MG tablet, Take 2 mg by mouth every evening., Disp: , Rfl:    doxycycline (ADOXA) 50 MG tablet, Take 50 mg by mouth daily at 12 noon., Disp: , Rfl:    empagliflozin (JARDIANCE) 25 MG TABS tablet, Take 12.5 mg by mouth daily., Disp: , Rfl:    fluticasone (FLONASE) 50 MCG/ACT nasal spray, Place 2 sprays into both nostrils at bedtime as needed for allergies or rhinitis. , Disp: , Rfl:    furosemide (LASIX) 20 MG tablet, Take 20 mg by mouth daily at 12 noon. , Disp: , Rfl:    gabapentin  (NEURONTIN) 100 MG capsule, Take 200 mg by mouth every evening., Disp: , Rfl:    GARLIC PO, Take 1 tablet by mouth daily., Disp: , Rfl:    hydrocortisone 2.5 % ointment, Apply 1 Application topically daily as needed (skin irritation on face)., Disp: , Rfl:    insulin glargine-yfgn (SEMGLEE, YFGN,) 100 UNIT/ML Pen, Inject 15 Units into the skin every evening., Disp: , Rfl:    Insulin Pen Needle (PEN NEEDLES) 32G X 4 MM MISC, Inject q daily with Rx of Lantus DxE-11.22 for 90 days, Disp: , Rfl:    isosorbide mononitrate (IMDUR) 30 MG 24 hr tablet, TAKE 1 TABLET BY MOUTH  DAILY, Disp: 90 tablet, Rfl: 3   L-LYSINE PO, Take 1 tablet by mouth daily., Disp: , Rfl:    loratadine (CLARITIN) 10 MG tablet, Take 10 mg by mouth daily., Disp: , Rfl:    losartan (COZAAR) 100 MG tablet, Take 100 mg by mouth daily., Disp: , Rfl:    metFORMIN (GLUCOPHAGE-XR) 500 MG 24 hr tablet, Take 1,000 mg by mouth 2 (two) times daily., Disp: , Rfl:    Mometasone Furoate 200 MCG/ACT AERO, Take 200 mcg by mouth daily., Disp: , Rfl:    Multiple Vitamin (MULTIVITAMIN) tablet, Take 1 tablet by mouth daily., Disp: , Rfl:    nitroGLYCERIN (NITROSTAT)  0.4 MG SL tablet, Place 1 tablet (0.4 mg total) under the tongue every 5 (five) minutes as needed for chest pain., Disp: 90 tablet, Rfl: 3   Omega-3 Fatty Acids (FISH OIL PO), Take 1 capsule by mouth 2 (two) times daily., Disp: , Rfl:    pentoxifylline (TRENTAL) 400 MG CR tablet, TAKE 1 TABLET BY MOUTH  TWICE DAILY, Disp: 180 tablet, Rfl: 3   potassium chloride SA (KLOR-CON M) 20 MEQ tablet, Take 20 mEq by mouth daily., Disp: , Rfl:    Probiotic Product (PROBIOTIC PO), Take 1 capsule by mouth every evening., Disp: , Rfl:    rOPINIRole (REQUIP) 1 MG tablet, Take 1 mg by mouth every evening. , Disp: , Rfl:    Semaglutide, 2 MG/DOSE, (OZEMPIC, 2 MG/DOSE,) 8 MG/3ML SOPN, Inject 2 mg into the skin See admin instructions. Inject 2 mg once a week on Thursdays., Disp: , Rfl:    simvastatin (ZOCOR)  40 MG tablet, Take 40 mg by mouth every evening., Disp: , Rfl:    triamcinolone ointment (KENALOG) 0.1 %, Apply 1 Application topically 2 (two) times daily., Disp: , Rfl:    umeclidinium-vilanterol (ANORO ELLIPTA) 62.5-25 MCG/INH AEPB, INHALE 1 INHALATION BY  MOUTH INTO THE LUNGS DAILY, Disp: 180 each, Rfl: 3  Past Medical History: Past Medical History:  Diagnosis Date   Atherosclerosis of native artery of both lower extremities with intermittent claudication (HCC)    followed by vascular , dr Myra Gianotti;  s/p angioplasty and stenting bilaterally 2015 and 11/ 2021 (per lov note 06-08-2021 bilateral iliofemoral intervention's are widely patent)   CAD (coronary artery disease)    cardiologist-- dr Gala Romney;  s/p stent placed 06/02/2000 in [place];  s/p cath with patent LCx , PCI w/ DES to pLAD and PCI to POBA of D1   Chronic diastolic (congestive) heart failure (HCC)    followed by cardiology   COPD (chronic obstructive pulmonary disease) with chronic bronchitis [facility])    pulmonology-- dr Craige Cotta--  (09-23-2021  per pt checks O2 stats at home, [place] 96-97% on RA)   Edema of left lower extremity    per pt since vasuclar surgery 11/ 2021, but told mild   History of COVID-19 06/2021   per pt mild symtpoms that resolved   Hyperlipidemia    Hypertension    followed by cardiology and pcp   OA (osteoarthritis)    OSA on CPAP    followed by dr Craige Cotta--  study in epic 08-23-2012 severe osa (no additional oxygen)   PAD (peripheral artery disease) (HCC)    Peripheral neuropathy    feet   Peripheral vascular disease (HCC)    Rosacea    S/P drug eluting coronary stent placement    2001  x1 stent (unsure if DES);  05/ 2020 DES to pLAD   Shortness of breath    with exertion with stairs but recovers quickly,  ok w/ household chores   Type 2 diabetes mellitus (HCC)    followed by pcp   (09-23-2021 per pt check blood sugar at home twice weekly in am,  fasting sugar-- 170)    Tobacco Use: Social History    Tobacco Use  Smoking Status Former   Current packs/day: 0.00   Average packs/day: 2.0 packs/day for 28.0 years (56.0 ttl pk-yrs)   Types: Cigarettes   Start date: 08/03/1959   Quit date: 08/03/1987   Years since quitting: 36.4  Smokeless Tobacco Never    Labs: Review Flowsheet  More data exists  Latest Ref Rng & Units 05/11/2019 09/16/2020 11/06/2020 10/04/2023 10/05/2023  Labs for ITP Cardiac and Pulmonary Rehab  Cholestrol 0 - 200 mg/dL - - 147  - 829   LDL (calc) 0 - 99 mg/dL - - 52  - 51   HDL-C >56 mg/dL - - 50  - 44   Trlycerides <150 mg/dL - - 81  - 213   Hemoglobin A1c 4.8 - 5.6 % - - - 7.7  -  PH, Arterial 7.350 - 7.450 7.299  - - - -  PCO2 arterial 32.0 - 48.0 mmHg 42.8  - - - -  Bicarbonate 20.0 - 28.0 mmol/L 23.8  22.7  21.0  - - - -  TCO2 22 - 32 mmol/L 25  24  22  25   - - -  Acid-base deficit 0.0 - 2.0 mmol/L 4.0  5.0  5.0  - - - -  O2 Saturation % 71.0  69.0  98.0  - - - -    Details       Multiple values from one day are sorted in reverse-chronological order         Capillary Blood Glucose: Lab Results  Component Value Date   GLUCAP 134 (H) 11/10/2023   GLUCAP 127 (H) 11/10/2023   GLUCAP 162 (H) 11/08/2023   GLUCAP 174 (H) 11/08/2023   GLUCAP 184 (H) 10/06/2023    POCT Glucose     Row Name 11/02/23 1042             POCT Blood Glucose   Pre-Exercise 105 mg/dL                Pulmonary Assessment Scores:  Pulmonary Assessment Scores     Row Name 11/02/23 1216         ADL UCSD   ADL Phase Entry     SOB Score total 32       CAT Score   CAT Score 5       mMRC Score   mMRC Score 3             UCSD: Self-administered rating of dyspnea associated with activities of daily living (ADLs) 6-point scale (0 = "not at all" to 5 = "maximal or unable to do because of breathlessness")  Scoring Scores range from 0 to 120.  Minimally important difference is 5 units  CAT: CAT can identify the health impairment of COPD patients and  is better correlated with disease progression.  CAT has a scoring range of zero to 40. The CAT score is classified into four groups of low (less than 10), medium (10 - 20), high (21-30) and very high (31-40) based on the impact level of disease on health status. A CAT score over 10 suggests significant symptoms.  A worsening CAT score could be explained by an exacerbation, poor medication adherence, poor inhaler technique, or progression of COPD or comorbid conditions.  CAT MCID is 2 points  mMRC: mMRC (Modified Medical Research Council) Dyspnea Scale is used to assess the degree of baseline functional disability in patients of respiratory disease due to dyspnea. No minimal important difference is established. A decrease in score of 1 point or greater is considered a positive change.   Pulmonary Function Assessment:  Pulmonary Function Assessment - 11/02/23 1051       Breath   Bilateral Breath Sounds Clear    Shortness of Breath Yes;Limiting activity             Exercise Target Goals:  Exercise Program Goal: Individual exercise prescription set using results from initial 6 min walk test and THRR while considering  patient's activity barriers and safety.   Exercise Prescription Goal: Initial exercise prescription builds to 30-45 minutes a day of aerobic activity, 2-3 days per week.  Home exercise guidelines will be given to patient during program as part of exercise prescription that the participant will acknowledge.  Activity Barriers & Risk Stratification:  Activity Barriers & Cardiac Risk Stratification - 11/02/23 1048       Activity Barriers & Cardiac Risk Stratification   Activity Barriers Deconditioning;Muscular Weakness;Shortness of Breath             6 Minute Walk:  6 Minute Walk     Row Name 11/02/23 1128         6 Minute Walk   Phase Initial     Distance 925 feet     Walk Time 6 minutes     # of Rest Breaks 3  2:09-2:36, 3:49-4:10, 5:30-6:00     MPH 1.75      METS 1.25     RPE 15     Perceived Dyspnea  3     VO2 Peak 4.38     Symptoms No     Resting HR 70 bpm     Resting BP 110/60     Resting Oxygen Saturation  94 %     Exercise Oxygen Saturation  during 6 min walk 92 %     Max Ex. HR 91 bpm     Max Ex. BP 124/56     2 Minute Post BP 112/52       Interval HR   1 Minute HR 80     2 Minute HR 79     3 Minute HR 90     4 Minute HR 88     5 Minute HR 86     6 Minute HR 84  91 @ 5:45     2 Minute Post HR 75     Interval Heart Rate? Yes       Interval Oxygen   Interval Oxygen? Yes     Baseline Oxygen Saturation % 95 %     1 Minute Oxygen Saturation % 94 %     1 Minute Liters of Oxygen 0 L     2 Minute Oxygen Saturation % 92 %     2 Minute Liters of Oxygen 0 L     3 Minute Oxygen Saturation % 92 %     3 Minute Liters of Oxygen 0 L     4 Minute Oxygen Saturation % 93 %     4 Minute Liters of Oxygen 0 L     5 Minute Oxygen Saturation % 92 %     5 Minute Liters of Oxygen 0 L     6 Minute Oxygen Saturation % 93 %     6 Minute Liters of Oxygen 0 L     2 Minute Post Oxygen Saturation % 97 %     2 Minute Post Liters of Oxygen 0 L              Oxygen Initial Assessment:  Oxygen Initial Assessment - 11/02/23 1050       Home Oxygen   Home Oxygen Device None    Sleep Oxygen Prescription None    Home Exercise Oxygen Prescription None    Home Resting Oxygen Prescription None    Compliance with Home Oxygen Use  No      Initial 6 min Walk   Oxygen Used None      Program Oxygen Prescription   Program Oxygen Prescription None      Intervention   Short Term Goals To learn and understand importance of maintaining oxygen saturations>88%;To learn and demonstrate proper use of respiratory medications;To learn and understand importance of monitoring SPO2 with pulse oximeter and demonstrate accurate use of the pulse oximeter.;To learn and demonstrate proper pursed lip breathing techniques or other breathing techniques.     Long  Term  Goals Maintenance of O2 saturations>88%;Compliance with respiratory medication;Verbalizes importance of monitoring SPO2 with pulse oximeter and return demonstration;Exhibits proper breathing techniques, such as pursed lip breathing or other method taught during program session;Demonstrates proper use of MDI's             Oxygen Re-Evaluation:  Oxygen Re-Evaluation     Row Name 11/16/23 1148 12/12/23 0947           Program Oxygen Prescription   Program Oxygen Prescription None None        Home Oxygen   Home Oxygen Device None None      Sleep Oxygen Prescription None None      Home Exercise Oxygen Prescription None None      Home Resting Oxygen Prescription None None      Compliance with Home Oxygen Use No No        Goals/Expected Outcomes   Short Term Goals To learn and understand importance of maintaining oxygen saturations>88%;To learn and demonstrate proper use of respiratory medications;To learn and understand importance of monitoring SPO2 with pulse oximeter and demonstrate accurate use of the pulse oximeter.;To learn and demonstrate proper pursed lip breathing techniques or other breathing techniques.  To learn and understand importance of maintaining oxygen saturations>88%;To learn and demonstrate proper use of respiratory medications;To learn and understand importance of monitoring SPO2 with pulse oximeter and demonstrate accurate use of the pulse oximeter.;To learn and demonstrate proper pursed lip breathing techniques or other breathing techniques.       Long  Term Goals Maintenance of O2 saturations>88%;Compliance with respiratory medication;Verbalizes importance of monitoring SPO2 with pulse oximeter and return demonstration;Exhibits proper breathing techniques, such as pursed lip breathing or other method taught during program session;Demonstrates proper use of MDI's Maintenance of O2 saturations>88%;Compliance with respiratory medication;Verbalizes importance of monitoring  SPO2 with pulse oximeter and return demonstration;Exhibits proper breathing techniques, such as pursed lip breathing or other method taught during program session;Demonstrates proper use of MDI's      Goals/Expected Outcomes Compliance and understanding of oxygen saturation monitoring and breathing techniques to decrease shortness of breath. Compliance and understanding of oxygen saturation monitoring and breathing techniques to decrease shortness of breath.               Oxygen Discharge (Final Oxygen Re-Evaluation):  Oxygen Re-Evaluation - 12/12/23 0947       Program Oxygen Prescription   Program Oxygen Prescription None      Home Oxygen   Home Oxygen Device None    Sleep Oxygen Prescription None    Home Exercise Oxygen Prescription None    Home Resting Oxygen Prescription None    Compliance with Home Oxygen Use No      Goals/Expected Outcomes   Short Term Goals To learn and understand importance of maintaining oxygen saturations>88%;To learn and demonstrate proper use of respiratory medications;To learn and understand importance of monitoring SPO2 with pulse oximeter and demonstrate accurate use of the pulse oximeter.;To  learn and demonstrate proper pursed lip breathing techniques or other breathing techniques.     Long  Term Goals Maintenance of O2 saturations>88%;Compliance with respiratory medication;Verbalizes importance of monitoring SPO2 with pulse oximeter and return demonstration;Exhibits proper breathing techniques, such as pursed lip breathing or other method taught during program session;Demonstrates proper use of MDI's    Goals/Expected Outcomes Compliance and understanding of oxygen saturation monitoring and breathing techniques to decrease shortness of breath.             Initial Exercise Prescription:  Initial Exercise Prescription - 11/02/23 1100       Date of Initial Exercise RX and Referring Provider   Date 11/02/23    Referring Provider Briones     Expected Discharge Date 01/26/24      NuStep   Level 1    SPM 60    Minutes 15    METs 1.5      Track   Minutes 15    METs 1.5      Prescription Details   Frequency (times per week) 2    Duration Progress to 30 minutes of continuous aerobic without signs/symptoms of physical distress      Intensity   THRR 40-80% of Max Heartrate 56-113    Ratings of Perceived Exertion 11-13    Perceived Dyspnea 0-4      Progression   Progression Continue to progress workloads to maintain intensity without signs/symptoms of physical distress.      Resistance Training   Training Prescription Yes    Weight red bands    Reps 10-15             Perform Capillary Blood Glucose checks as needed.  Exercise Prescription Changes:   Exercise Prescription Changes     Row Name 11/08/23 1100 11/22/23 1100 12/06/23 1100         Response to Exercise   Blood Pressure (Admit) 110/50 90/50 106/60     Blood Pressure (Exercise) 140/66 138/62 116/60     Blood Pressure (Exit) 95/63 90/50 90/56      Heart Rate (Admit) 68 bpm 68 bpm 69 bpm     Heart Rate (Exercise) 86 bpm 101 bpm 92 bpm     Heart Rate (Exit) 82 bpm 80 bpm 77 bpm     Oxygen Saturation (Admit) 94 % 91 % 93 %     Oxygen Saturation (Exercise) 95 % 91 % 93 %     Oxygen Saturation (Exit) 93 % 94 % 93 %     Rating of Perceived Exertion (Exercise) 15 14 13      Perceived Dyspnea (Exercise) 3 3 3      Duration Continue with 30 min of aerobic exercise without signs/symptoms of physical distress. Continue with 30 min of aerobic exercise without signs/symptoms of physical distress. Continue with 30 min of aerobic exercise without signs/symptoms of physical distress.     Intensity THRR unchanged THRR unchanged THRR unchanged       Progression   Progression Continue to progress workloads to maintain intensity without signs/symptoms of physical distress. Continue to progress workloads to maintain intensity without signs/symptoms of physical  distress. Continue to progress workloads to maintain intensity without signs/symptoms of physical distress.       Resistance Training   Training Prescription Yes Yes Yes     Weight red bands blue bands blue bands     Reps 10-15 10-15 10-15     Time 10 Minutes 10 Minutes 10 Minutes  NuStep   Level 2 3 3      SPM -- 93 --     Minutes 15 15 15      METs 2 2.6 2.6       Track   Laps 7 8 8      Minutes 15 15 15      METs 2.08 2.23 2.23              Exercise Comments:   Exercise Comments     Row Name 12/12/23 0948           Exercise Comments Completed home exercise plan. Aladdin is not currently exercising at home. He access to a fitness center at his home. I encouraged Gillie to use the fitness center and exercise on the bike or other aerobic equipment at least 1 non-rehab day/wk for 30 min/day. He agreed with my recommendations. I asked Tynell to show me a picture of the fitness center so I could advise on what equipment would benefit him the best. Will follow up on exercise equipment. Javieon seems motivated to exercise and decrease his shortness of breath.                Exercise Goals and Review:   Exercise Goals     Row Name 11/02/23 1048             Exercise Goals   Increase Physical Activity Yes       Intervention Provide advice, education, support and counseling about physical activity/exercise needs.;Develop an individualized exercise prescription for aerobic and resistive training based on initial evaluation findings, risk stratification, comorbidities and participant's personal goals.       Expected Outcomes Short Term: Attend rehab on a regular basis to increase amount of physical activity.;Long Term: Exercising regularly at least 3-5 days a week.;Long Term: Add in home exercise to make exercise part of routine and to increase amount of physical activity.       Increase Strength and Stamina Yes       Intervention Provide advice, education, support and  counseling about physical activity/exercise needs.;Develop an individualized exercise prescription for aerobic and resistive training based on initial evaluation findings, risk stratification, comorbidities and participant's personal goals.       Expected Outcomes Short Term: Increase workloads from initial exercise prescription for resistance, speed, and METs.;Short Term: Perform resistance training exercises routinely during rehab and add in resistance training at home;Long Term: Improve cardiorespiratory fitness, muscular endurance and strength as measured by increased METs and functional capacity ( )       Able to understand and use rate of perceived exertion (RPE) scale Yes       Intervention Provide education and explanation on how to use RPE scale       Expected Outcomes Short Term: Able to use RPE daily in rehab to express subjective intensity level;Long Term:  Able to use RPE to guide intensity level when exercising independently       Able to understand and use Dyspnea scale Yes       Intervention Provide education and explanation on how to use Dyspnea scale       Expected Outcomes Short Term: Able to use Dyspnea scale daily in rehab to express subjective sense of shortness of breath during exertion;Long Term: Able to use Dyspnea scale to guide intensity level when exercising independently       Knowledge and understanding of Target Heart Rate Range (THRR) Yes       Intervention Provide education and explanation  of THRR including how the numbers were predicted and where they are located for reference       Expected Outcomes Short Term: Able to state/look up THRR;Short Term: Able to use daily as guideline for intensity in rehab;Long Term: Able to use THRR to govern intensity when exercising independently       Understanding of Exercise Prescription Yes       Intervention Provide education, explanation, and written materials on patient's individual exercise prescription       Expected  Outcomes Short Term: Able to explain program exercise prescription;Long Term: Able to explain home exercise prescription to exercise independently                Exercise Goals Re-Evaluation :  Exercise Goals Re-Evaluation     Row Name 11/16/23 1141 12/12/23 0945           Exercise Goal Re-Evaluation   Exercise Goals Review Increase Physical Activity;Able to understand and use Dyspnea scale;Understanding of Exercise Prescription;Increase Strength and Stamina;Knowledge and understanding of Target Heart Rate Range (THRR);Able to understand and use rate of perceived exertion (RPE) scale Increase Physical Activity;Able to understand and use Dyspnea scale;Understanding of Exercise Prescription;Increase Strength and Stamina;Knowledge and understanding of Target Heart Rate Range (THRR);Able to understand and use rate of perceived exertion (RPE) scale      Comments Aidenjames has completed 3 exercise sessions. He exercises for 15 min on the track and Nustep. He averages 2.23 METs on the track and 2.5 METs at level 2 on the Nustep. He performs the warmup and cooldown standing without limitations. It is too soon to notate any discernable progressions. Will continue to monitor and progess as able. Duel has completed 9 exercise sessions. He exercises for 15 min on the track and Nustep. He averages 2.23 METs on the track and 2.7 METs at level 3 on the Nustep. He performs the warmup and cooldown standing without limitations. Maciel has increased his level for the Nustep as METs have remained relatively the same. Will encourage Malicah to increase track laps and level on Nustep. We have recently discussed home exercise as Koby is not exercising at home. I encouraged him to use a fitness center to exercise. Will continue to monitor and progess as able.      Expected Outcomes Through exercise at rehab and home, the patient will decrease shortness of breath with daily activities and feel confident in carrying out an  exercise regimen at home. Through exercise at rehab and home, the patient will decrease shortness of breath with daily activities and feel confident in carrying out an exercise regimen at home.               Discharge Exercise Prescription (Final Exercise Prescription Changes):  Exercise Prescription Changes - 12/06/23 1100       Response to Exercise   Blood Pressure (Admit) 106/60    Blood Pressure (Exercise) 116/60    Blood Pressure (Exit) 90/56    Heart Rate (Admit) 69 bpm    Heart Rate (Exercise) 92 bpm    Heart Rate (Exit) 77 bpm    Oxygen Saturation (Admit) 93 %    Oxygen Saturation (Exercise) 93 %    Oxygen Saturation (Exit) 93 %    Rating of Perceived Exertion (Exercise) 13    Perceived Dyspnea (Exercise) 3    Duration Continue with 30 min of aerobic exercise without signs/symptoms of physical distress.    Intensity THRR unchanged      Progression  Progression Continue to progress workloads to maintain intensity without signs/symptoms of physical distress.      Resistance Training   Training Prescription Yes    Weight blue bands    Reps 10-15    Time 10 Minutes      NuStep   Level 3    Minutes 15    METs 2.6      Track   Laps 8    Minutes 15    METs 2.23             Nutrition:  Target Goals: Understanding of nutrition guidelines, daily intake of sodium 1500mg , cholesterol 200mg , calories 30% from fat and 7% or less from saturated fats, daily to have 5 or more servings of fruits and vegetables.  Biometrics:    Nutrition Therapy Plan and Nutrition Goals:  Nutrition Therapy & Goals - 11/08/23 1127       Nutrition Therapy   Diet Heart Healthy/Carbohydrate Consistent Diet    Drug/Food Interactions Statins/Certain Fruits      Personal Nutrition Goals   Nutrition Goal Patient to improve diet quality by using the plate method as a guide for meal planning to include lean protein/plant protein, fruits, vegetables, whole grains, nonfat dairy as part  of a well-balanced diet.    Comments Ferdinand has medical history of COPD, CAD, PAD, HTN, DM2, CKD3. His A1c remains above goal of <7% (7.7); he continues ozempic, metformin, semglee. He reports working on weight loss through diet and exercise. His spouse is supportive. Patient will benefit from participation in pulmonary rehab for nutrition, exercise, and lifestyle modification.      Intervention Plan   Intervention Prescribe, educate and counsel regarding individualized specific dietary modifications aiming towards targeted core components such as weight, hypertension, lipid management, diabetes, heart failure and other comorbidities.;Nutrition handout(s) given to patient.    Expected Outcomes Short Term Goal: Understand basic principles of dietary content, such as calories, fat, sodium, cholesterol and nutrients.;Long Term Goal: Adherence to prescribed nutrition plan.             Nutrition Assessments:  Nutrition Assessments - 11/08/23 1132       Rate Your Plate Scores   Pre Score 46            MEDIFICTS Score Key: >=70 Need to make dietary changes  40-70 Heart Healthy Diet <= 40 Therapeutic Level Cholesterol Diet  Flowsheet Row PULMONARY REHAB CHRONIC OBSTRUCTIVE PULMONARY DISEASE from 11/08/2023 in [facility] for Heart, Vascular, & Lung Health  Picture Your Plate Total Score on Admission 46      Picture Your Plate Scores: <53 Unhealthy dietary pattern with much room for improvement. 41-50 Dietary pattern unlikely to meet recommendations for good health and room for improvement. 51-60 More healthful dietary pattern, with some room for improvement.  >60 Healthy dietary pattern, although there may be some specific behaviors that could be improved.    Nutrition Goals Re-Evaluation:  Nutrition Goals Re-Evaluation     Row Name 11/08/23 1127             Goals   Current Weight 229 lb 8 oz (104.1 kg)       Comment triglycerides 154, A1c 7.7, GFR  WNL, LDL 51       Expected Outcome Krischan has medical history of COPD, CAD, PAD, HTN, DM2, CKD3. His A1c remains above goal of <7% (7.7); he continues ozempic, metformin, semglee. He reports working on weight loss through diet and exercise. His spouse  is supportive. Patient will benefit from participation in pulmonary rehab for nutrition, exercise, and lifestyle modification.                Nutrition Goals Discharge (Final Nutrition Goals Re-Evaluation):  Nutrition Goals Re-Evaluation - 11/08/23 1127       Goals   Current Weight 229 lb 8 oz (104.1 kg)    Comment triglycerides 154, A1c 7.7, GFR WNL, LDL 51    Expected Outcome Alix has medical history of COPD, CAD, PAD, HTN, DM2, CKD3. His A1c remains above goal of <7% (7.7); he continues ozempic, metformin, semglee. He reports working on weight loss through diet and exercise. His spouse is supportive. Patient will benefit from participation in pulmonary rehab for nutrition, exercise, and lifestyle modification.             Psychosocial: Target Goals: Acknowledge presence or absence of significant depression and/or stress, maximize coping skills, provide positive support system. Participant is able to verbalize types and ability to use techniques and skills needed for reducing stress and depression.  Initial Review & Psychosocial Screening:  Initial Psych Review & Screening - 11/02/23 1051       Initial Review   Current issues with None Identified      Family Dynamics   Good Support System? Yes    Comments Pt denies any psy/soc barriers      Barriers   Psychosocial barriers to participate in program There are no identifiable barriers or psychosocial needs.      Screening Interventions   Interventions Encouraged to exercise    Expected Outcomes Long Term Goal: Stressors or current issues are controlled or eliminated.;Short Term goal: Identification and review with participant of any Quality of Life or Depression concerns  found by scoring the questionnaire.             Quality of Life Scores:  Scores of 19 and below usually indicate a poorer quality of life in these areas.  A difference of  2-3 points is a clinically meaningful difference.  A difference of 2-3 points in the total score of the Quality of Life Index has been associated with significant improvement in overall quality of life, self-image, physical symptoms, and general health in studies assessing change in quality of life.  PHQ-9: Review Flowsheet       11/02/2023  Depression screen PHQ 2/9  Decreased Interest 0  Down, Depressed, Hopeless 0  PHQ - 2 Score 0  Altered sleeping 0  Tired, decreased energy 0  Change in appetite 0  Feeling bad or failure about yourself  0  Trouble concentrating 0  Moving slowly or fidgety/restless 0  Suicidal thoughts 0  PHQ-9 Score 0  Difficult doing work/chores Not difficult at all   Interpretation of Total Score  Total Score Depression Severity:  1-4 = Minimal depression, 5-9 = Mild depression, 10-14 = Moderate depression, 15-19 = Moderately severe depression, 20-27 = Severe depression   Psychosocial Evaluation and Intervention:  Psychosocial Evaluation - 11/02/23 1052       Psychosocial Evaluation & Interventions   Interventions Encouraged to exercise with the program and follow exercise prescription    Comments Pt denies any psy/soc barriers or concerns    Expected Outcomes For Geroge to particiapte in PR without any psy/soc barriers    Continue Psychosocial Services  No Follow up required             Psychosocial Re-Evaluation:  Psychosocial Re-Evaluation     Row Name 11/16/23 [id number]  12/09/23 1412           Psychosocial Re-Evaluation   Current issues with None Identified None Identified      Comments Eino denies any psychosocial barriers or concerns at this time. Daryus continues to deny any psychosocial barriers or concerns at this time.      Expected Outcomes For Reegan to  participate in PR free of any psychosocial barriers or concerns. For Mahith to participate in PR free of any psychosocial barriers or concerns.      Interventions Encouraged to attend Pulmonary Rehabilitation for the exercise Encouraged to attend Pulmonary Rehabilitation for the exercise      Continue Psychosocial Services  No Follow up required No Follow up required               Psychosocial Discharge (Final Psychosocial Re-Evaluation):  Psychosocial Re-Evaluation - 12/09/23 1412       Psychosocial Re-Evaluation   Current issues with None Identified    Comments Jerediah continues to deny any psychosocial barriers or concerns at this time.    Expected Outcomes For Hulices to participate in PR free of any psychosocial barriers or concerns.    Interventions Encouraged to attend Pulmonary Rehabilitation for the exercise    Continue Psychosocial Services  No Follow up required             Education: Education Goals: Education classes will be provided on a weekly basis, covering required topics. Participant will state understanding/return demonstration of topics presented.  Learning Barriers/Preferences:  Learning Barriers/Preferences - 11/02/23 1218       Learning Barriers/Preferences   Learning Barriers None    Learning Preferences Written Material;Group Instruction             Education Topics: Know Your Numbers Group instruction that is supported by a PowerPoint presentation. Instructor discusses importance of knowing and understanding resting, exercise, and post-exercise oxygen saturation, heart rate, and blood pressure. Oxygen saturation, heart rate, blood pressure, rating of perceived exertion, and dyspnea are reviewed along with a normal range for these values.    Exercise for the Pulmonary Patient Group instruction that is supported by a PowerPoint presentation. Instructor discusses benefits of exercise, core components of exercise, frequency, duration, and  intensity of an exercise routine, importance of utilizing pulse oximetry during exercise, safety while exercising, and options of places to exercise outside of rehab.    MET Level  Group instruction provided by PowerPoint, verbal discussion, and written material to support subject matter. Instructor reviews what METs are and how to increase METs.  Flowsheet Row PULMONARY REHAB CHRONIC OBSTRUCTIVE PULMONARY DISEASE from 11/17/2023 in [facility] for Heart, Vascular, & Lung Health  Date 11/17/23  Educator EP  Instruction Review Code 1- Verbalizes Understanding       Pulmonary Medications Verbally interactive group education provided by instructor with focus on inhaled medications and proper administration. Flowsheet Row PULMONARY REHAB CHRONIC OBSTRUCTIVE PULMONARY DISEASE from 12/15/2023 in [facility] for Heart, Vascular, & Lung Health  Date 12/15/23  Educator RT  Instruction Review Code 1- Verbalizes Understanding       Anatomy and Physiology of the Respiratory System Group instruction provided by PowerPoint, verbal discussion, and written material to support subject matter. Instructor reviews respiratory cycle and anatomical components of the respiratory system and their functions. Instructor also reviews differences in obstructive and restrictive respiratory diseases with examples of each.  Flowsheet Row PULMONARY REHAB CHRONIC OBSTRUCTIVE PULMONARY DISEASE from 12/08/2023 in [facility] Heart, Vascular, & Lung Health  Date 12/08/23  Educator RT  Instruction Review Code 1- Verbalizes Understanding       Oxygen Safety Group instruction provided by PowerPoint, verbal discussion, and written material to support subject matter. There is an overview of "What is Oxygen" and "Why do we need it".  Instructor also reviews how to create a safe environment for oxygen use, the importance of using oxygen as  prescribed, and the risks of noncompliance. There is a brief discussion on traveling with oxygen and resources the patient may utilize.   Oxygen Use Group instruction provided by PowerPoint, verbal discussion, and written material to discuss how supplemental oxygen is prescribed and different types of oxygen supply systems. Resources for more information are provided.    Breathing Techniques Group instruction that is supported by demonstration and informational handouts. Instructor discusses the benefits of pursed lip and diaphragmatic breathing and detailed demonstration on how to perform both.     Risk Factor Reduction Group instruction that is supported by a PowerPoint presentation. Instructor discusses the definition of a risk factor, different risk factors for pulmonary disease, and how the heart and lungs work together. Flowsheet Row PULMONARY REHAB CHRONIC OBSTRUCTIVE PULMONARY DISEASE from 11/10/2023 in [facility] for Heart, Vascular, & Lung Health  Date 11/10/23  Educator EP  Instruction Review Code 1- Verbalizes Understanding       Pulmonary Diseases Group instruction provided by PowerPoint, verbal discussion, and written material to support subject matter. Instructor gives an overview of the different type of pulmonary diseases. There is also a discussion on risk factors and symptoms as well as ways to manage the diseases. Flowsheet Row PULMONARY REHAB CHRONIC OBSTRUCTIVE PULMONARY DISEASE from 12/01/2023 in [facility] for Heart, Vascular, & Lung Health  Date 12/01/23  Educator RT  Instruction Review Code 1- Verbalizes Understanding       Stress and Energy Conservation Group instruction provided by PowerPoint, verbal discussion, and written material to support subject matter. Instructor gives an overview of stress and the impact it can have on the body. Instructor also reviews ways to reduce stress. There is also a discussion  on energy conservation and ways to conserve energy throughout the day.   Warning Signs and Symptoms Group instruction provided by PowerPoint, verbal discussion, and written material to support subject matter. Instructor reviews warning signs and symptoms of stroke, heart attack, cold and flu. Instructor also reviews ways to prevent the spread of infection.   Other Education Group or individual verbal, written, or video instructions that support the educational goals of the pulmonary rehab program.    Knowledge Questionnaire Score:  Knowledge Questionnaire Score - 11/02/23 1218       Knowledge Questionnaire Score   Pre Score 16/18             Core Components/Risk Factors/Patient Goals at Admission:  Personal Goals and Risk Factors at Admission - 11/02/23 1052       Core Components/Risk Factors/Patient Goals on Admission    Weight Management Weight Loss;Yes    Improve shortness of breath with ADL's Yes    Intervention Provide education, individualized exercise plan and daily activity instruction to help decrease symptoms of SOB with activities of daily living.    Expected Outcomes Short Term: Improve cardiorespiratory fitness to achieve a reduction of symptoms when performing ADLs;Long Term: Be able to perform more ADLs without symptoms or delay the onset of symptoms  Core Components/Risk Factors/Patient Goals Review:   Goals and Risk Factor Review     Row Name 11/16/23 0950 12/09/23 1412           Core Components/Risk Factors/Patient Goals Review   Personal Goals Review Weight Management/Obesity;Improve shortness of breath with ADL's;Develop more efficient breathing techniques such as purse lipped breathing and diaphragmatic breathing and practicing self-pacing with activity. Weight Management/Obesity;Improve shortness of breath with ADL's      Review Haize has only attended 3 sessions so far. Goal progressing for weight loss. Kevins is working with the  staff dietitian to achieve his weight loss goals. Goal progressing on improving shortness of breath with ADL's. Goal progressing on developing more efficient breathing techniques such as purse lipped breathing and diaphragmatic breathing; and practicing self-pacing with activity. Goal progressing for weight loss. Goal progressing on improving shortness of breath with ADL's. Goal met on developing more efficient breathing techniques such as purse lipped breathing and diaphragmatic breathing; and practicing self-pacing with activity. Rondel is able to demonstrate purse lip breathing when he gets SOB. He also knows how to self pace when walking the track. He is able to maintain sats >88% on RA while exercising. We will continue to monitor Chuck's progress throughout the program.      Expected Outcomes To improve shortness of breath with ADL's. Develop more efficient breathing techniques such as purse lipped breathing and diaphragmatic breathing; and practicing self-pacing with activity and lose weight. To improve shortness of breath with ADL's and lose weight               Core Components/Risk Factors/Patient Goals at Discharge (Final Review):   Goals and Risk Factor Review - 12/09/23 1412       Core Components/Risk Factors/Patient Goals Review   Personal Goals Review Weight Management/Obesity;Improve shortness of breath with ADL's    Review Goal progressing for weight loss. Goal progressing on improving shortness of breath with ADL's. Goal met on developing more efficient breathing techniques such as purse lipped breathing and diaphragmatic breathing; and practicing self-pacing with activity. Jaideep is able to demonstrate purse lip breathing when he gets SOB. He also knows how to self pace when walking the track. He is able to maintain sats >88% on RA while exercising. We will continue to monitor Sie's progress throughout the program.    Expected Outcomes To improve shortness of breath with ADL's  and lose weight             ITP Comments: Pt is making expected progress toward Pulmonary Rehab goals after completing 11 session(s). Recommend continued exercise, life style modification, education, and utilization of breathing techniques to increase stamina and strength, while also decreasing shortness of breath with exertion.     Comments: Dr. Mechele Collin is Medical Director for Pulmonary Rehab at [facility].

## 2023-12-20 NOTE — Progress Notes (Signed)
Daily Session Note  Patient Details  Name: Anthony [name] MRN: [id number] Date of Birth: [birth year] Referring Provider:   Doristine Devoid Pulmonary Rehab Walk Test from 11/02/2023 in [facility] for Heart, Vascular, & Lung Health  Referring Provider Briones       Encounter Date: 12/20/2023  Check In:  Session Check In - 12/20/23 1030       Check-In   Supervising physician immediately available to respond to emergencies [facility] MD immediately available    Physician(s) Edd Fabian, NP    Location [facility]    Staff Present Essie Hart, RN, BSN;Jream Broyles Idelle Crouch BS, ACSM-CEP, Exercise Physiologist;Casey Katrinka Blazing, RT    Virtual Visit No    Medication changes reported     No    Fall or balance concerns reported    No    Tobacco Cessation No Change    Warm-up and Cool-down Performed as group-led instruction    Resistance Training Performed Yes    VAD Patient? No    PAD/SET Patient? No      Pain Assessment   Currently in Pain? No/denies    Multiple Pain Sites No             Capillary Blood Glucose: No results found for this or any previous visit (from the past 24 hours).   Exercise Prescription Changes - 12/20/23 1200       Response to Exercise   Blood Pressure (Admit) 104/52    Blood Pressure (Exercise) 118/62    Blood Pressure (Exit) 96/44    Heart Rate (Admit) 73 bpm    Heart Rate (Exercise) 91 bpm    Heart Rate (Exit) 92 bpm    Oxygen Saturation (Admit) 73 %    Oxygen Saturation (Exercise) 92 %    Oxygen Saturation (Exit) 92 %    Rating of Perceived Exertion (Exercise) 13    Perceived Dyspnea (Exercise) 3    Duration Continue with 30 min of aerobic exercise without signs/symptoms of physical distress.    Intensity THRR unchanged      Progression   Progression Continue to progress workloads to maintain intensity without signs/symptoms of physical distress.    Average METs 2.5      Resistance Training   Training Prescription  Yes    Weight blue bands    Reps 10-15    Time 10 Minutes      NuStep   Level 3    SPM 90    Minutes 15    METs 2.5      Track   Laps 7    Minutes 15    METs 2.09             Social History   Tobacco Use  Smoking Status Former   Current packs/day: 0.00   Average packs/day: 2.0 packs/day for 28.0 years (56.0 ttl pk-yrs)   Types: Cigarettes   Start date: 08/03/1959   Quit date: 08/03/1987   Years since quitting: 36.4  Smokeless Tobacco Never    Goals Met:  Independence with exercise equipment Exercise tolerated well No report of concerns or symptoms today Strength training completed today  Goals Unmet:  Not Applicable  Comments: Service time is from 1009 to 1140.    Dr. Mechele Collin is Medical Director for Pulmonary Rehab at [facility].

## 2023-12-22 ENCOUNTER — Encounter (HOSPITAL_COMMUNITY)
Admission: RE | Admit: 2023-12-22 | Discharge: 2023-12-22 | Disposition: A | Discharge status: Home / Self Care | Payer: No Typology Code available for payment source | Source: Ambulatory Visit | Attending: Pulmonary Disease | Admitting: Pulmonary Disease

## 2023-12-22 DIAGNOSIS — J449 Chronic obstructive pulmonary disease, unspecified: Secondary | ICD-10-CM

## 2023-12-22 DIAGNOSIS — Z5189 Encounter for other specified aftercare: Secondary | ICD-10-CM | POA: Diagnosis not present

## 2023-12-22 NOTE — Progress Notes (Signed)
Daily Session Note  Patient Details  Name: Anthony [name] MRN: [id number] Date of Birth: 08-16-1946 Referring Provider:   Doristine Devoid Pulmonary Rehab Walk Test from 11/02/2023 in [facility] for Heart, Vascular, & Lung Health  Referring Provider Briones       Encounter Date: 12/22/2023  Check In:  Session Check In - 12/22/23 1030       Check-In   Supervising physician immediately available to respond to emergencies [facility] MD immediately available    Physician(s) Reather Littler, NP    Location [facility]    Staff Present Essie Hart, RN, BSN;Randi Idelle Crouch BS, ACSM-CEP, Exercise Physiologist;Emeri Estill Thedore Mins, RN, BSN    Virtual Visit No    Medication changes reported     No    Fall or balance concerns reported    No    Tobacco Cessation No Change    Warm-up and Cool-down Performed as group-led Writer Performed Yes    VAD Patient? No    PAD/SET Patient? No      Pain Assessment   Currently in Pain? No/denies    Multiple Pain Sites No             Capillary Blood Glucose: No results found for this or any previous visit (from the past 24 hours).    Social History   Tobacco Use  Smoking Status Former   Current packs/day: 0.00   Average packs/day: 2.0 packs/day for 28.0 years (56.0 ttl pk-yrs)   Types: Cigarettes   Start date: 08/03/1959   Quit date: 08/03/1987   Years since quitting: 36.4  Smokeless Tobacco Never    Goals Met:  Proper associated with RPD/PD & O2 Sat Independence with exercise equipment Exercise tolerated well No report of concerns or symptoms today Strength training completed today  Goals Unmet:  Not Applicable  Comments: Service time is from 1004 to 1148.    Dr. Mechele Collin is Medical Director for Pulmonary Rehab at [facility].

## 2023-12-27 ENCOUNTER — Encounter (HOSPITAL_COMMUNITY)
Admission: RE | Admit: 2023-12-27 | Discharge: 2023-12-27 | Disposition: A | Discharge status: Home / Self Care | Payer: No Typology Code available for payment source | Source: Ambulatory Visit | Attending: Pulmonary Disease | Admitting: Pulmonary Disease

## 2023-12-27 DIAGNOSIS — J449 Chronic obstructive pulmonary disease, unspecified: Secondary | ICD-10-CM

## 2023-12-27 DIAGNOSIS — Z5189 Encounter for other specified aftercare: Secondary | ICD-10-CM | POA: Diagnosis not present

## 2023-12-27 NOTE — Progress Notes (Signed)
 Daily Session Note  Patient Details  Name: Anthony [name] MRN: [id number] Date of Birth: 06/04/1946 Referring Provider:   Conrad Ports Pulmonary Rehab Walk Test from 11/02/2023 in [facility] for Heart, Vascular, & Lung Health  Referring Provider Briones       Encounter Date: 12/27/2023  Check In:  Session Check In - 12/27/23 1106       Check-In   Supervising physician immediately available to respond to emergencies [facility] MD immediately available    Physician(s) Orren Fabry, NP    Location [facility]    Staff Present Ronal Levin, RN, BSN;Randi Midge BS, ACSM-CEP, Exercise Physiologist;Dravon Nott Claudene, RT    Virtual Visit No    Medication changes reported     No    Fall or balance concerns reported    No    Tobacco Cessation No Change    Warm-up and Cool-down Performed as group-led instruction    Resistance Training Performed Yes    VAD Patient? No    PAD/SET Patient? No      Pain Assessment   Currently in Pain? No/denies    Multiple Pain Sites No             Capillary Blood Glucose: No results found for this or any previous visit (from the past 24 hours).    Social History   Tobacco Use  Smoking Status Former   Current packs/day: 0.00   Average packs/day: 2.0 packs/day for 28.0 years (56.0 ttl pk-yrs)   Types: Cigarettes   Start date: 08/03/1959   Quit date: 08/03/1987   Years since quitting: 36.4  Smokeless Tobacco Never    Goals Met:  Proper associated with RPD/PD & O2 Sat Independence with exercise equipment Exercise tolerated well No report of concerns or symptoms today Strength training completed today  Goals Unmet:  Not Applicable  Comments: Service time is from 1007 to 1135.    Dr. Slater Staff is Medical Director for Pulmonary Rehab at [facility].

## 2023-12-29 ENCOUNTER — Encounter (HOSPITAL_COMMUNITY)
Admission: RE | Admit: 2023-12-29 | Discharge: 2023-12-29 | Disposition: A | Discharge status: Home / Self Care | Payer: No Typology Code available for payment source | Source: Ambulatory Visit | Attending: Pulmonary Disease | Admitting: Pulmonary Disease

## 2023-12-29 DIAGNOSIS — R0609 Other forms of dyspnea: Secondary | ICD-10-CM | POA: Diagnosis not present

## 2023-12-29 DIAGNOSIS — J449 Chronic obstructive pulmonary disease, unspecified: Secondary | ICD-10-CM | POA: Diagnosis present

## 2023-12-29 NOTE — Progress Notes (Signed)
 Daily Session Note  Patient Details  Name: Anthony [name] MRN: [id number] Date of Birth: Oct 03, 1946 Referring Provider:   Conrad Ports Pulmonary Rehab Walk Test from 11/02/2023 in [facility]  Referring Provider Briones       Encounter Date: 12/29/2023  Check In:  Session Check In - 12/29/23 1118       Check-In   Supervising physician immediately available to respond to emergencies [facility] MD immediately available    Physician(s) Rosaline Skains, NP    Location [facility]    Staff Present Ronal Levin, RN, BSN;Daemion Mcniel Midge BS, ACSM-CEP, Exercise Physiologist;Casey Claudene, RT    Virtual Visit No    Medication changes reported     No    Fall or balance concerns reported    No    Tobacco Cessation No Change    Warm-up and Cool-down Performed as group-led instruction    Resistance Training Performed Yes    VAD Patient? No    PAD/SET Patient? No      Pain Assessment   Currently in Pain? No/denies    Multiple Pain Sites No             Capillary Blood Glucose: No results found for this or any previous visit (from the past 24 hours).    Social History   Tobacco Use  Smoking Status Former   Current packs/day: 0.00   Average packs/day: 2.0 packs/day for 28.0 years (56.0 ttl pk-yrs)   Types: Cigarettes   Start date: 08/03/1959   Quit date: 08/03/1987   Years since quitting: 36.4  Smokeless Tobacco Never    Goals Met:  Independence with exercise equipment Exercise tolerated well No report of concerns or symptoms today Strength training completed today  Goals Unmet:  Not Applicable  Comments: Service time is from 1009 to 1147.    Dr. Slater Staff is Medical Director for Pulmonary Rehab at [facility].

## 2024-01-03 ENCOUNTER — Encounter (HOSPITAL_COMMUNITY)
Admission: RE | Admit: 2024-01-03 | Discharge: 2024-01-03 | Disposition: A | Discharge status: Home / Self Care | Payer: No Typology Code available for payment source | Source: Ambulatory Visit | Attending: Pulmonary Disease

## 2024-01-03 VITALS — Wt 224.9 lb

## 2024-01-03 DIAGNOSIS — J449 Chronic obstructive pulmonary disease, unspecified: Secondary | ICD-10-CM | POA: Diagnosis not present

## 2024-01-03 NOTE — Progress Notes (Signed)
 Daily Session Note  Patient Details  Name: Anthony [name] MRN: [id number] Date of Birth: 1946/06/30 Referring Provider:   Conrad Ports Pulmonary Rehab Walk Test from 11/02/2023 in [facility] for [facility], Vascular, & Lung [facility]  Referring Provider Briones       Encounter Date: 01/03/2024  Check In:  Session Check In - 01/03/24 1018       Check-In   Supervising physician immediately available to respond to emergencies [facility] immediately available    Physician(s) Rosaline Skains, NP    [facility]    Staff Present Ronal Levin, RN, BSN;Sherissa Tenenbaum Midge BS, ACSM-CEP, Exercise Physiologist;Casey Claudene Neita Moats, MS, ACSM-CEP, Exercise Physiologist    Virtual Visit No    Medication changes reported     No    Fall or balance concerns reported    No    Tobacco Cessation No Change    Warm-up and Cool-down Performed as group-led instruction    Resistance Training Performed Yes    VAD Patient? No    PAD/SET Patient? No      Pain Assessment   Currently in Pain? No/denies    Multiple Pain Sites No             Capillary Blood Glucose: No results found for this or any previous visit (from the past 24 hours).   Exercise Prescription Changes - 01/03/24 1100       Response to Exercise   Blood Pressure (Admit) 118/62    Blood Pressure (Exercise) 110/50    Blood Pressure (Exit) 98/61    Heart Rate (Admit) 78 bpm    Heart Rate (Exercise) 90 bpm    Heart Rate (Exit) 84 bpm    Oxygen Saturation (Admit) 93 %    Oxygen Saturation (Exercise) 89 %    Oxygen Saturation (Exit) 91 %    Rating of Perceived Exertion (Exercise) 14    Perceived Dyspnea (Exercise) 3    Duration Continue with 30 min of aerobic exercise without signs/symptoms of physical distress.    Intensity THRR unchanged      Progression   Progression Continue to progress workloads to maintain intensity without signs/symptoms of physical distress.      Resistance  Training   Training Prescription Yes    Weight black bands    Reps 10-15    Time 10 Minutes      NuStep   Level 4    Minutes 15    METs 2.6      Track   Laps 7    Minutes 15    METs 2.09             Social History   Tobacco Use  Smoking Status Former   Current packs/day: 0.00   Average packs/day: 2.0 packs/day for 28.0 years (56.0 ttl pk-yrs)   Types: Cigarettes   Start date: 08/03/1959   Quit date: 08/03/1987   Years since quitting: 36.4  Smokeless Tobacco Never    Goals Met:  Independence with exercise equipment Exercise tolerated well No report of concerns or symptoms today Strength training completed today  Goals Unmet:  Not Applicable  Comments: Service time is from 1000 to 1125.    Dr. Slater Staff is Medical Director for Pulmonary Rehab at [facility].

## 2024-01-05 ENCOUNTER — Encounter (HOSPITAL_COMMUNITY)
Admission: RE | Admit: 2024-01-05 | Discharge: 2024-01-05 | Disposition: A | Discharge status: Home / Self Care | Payer: No Typology Code available for payment source | Source: Ambulatory Visit | Attending: Pulmonary Disease | Admitting: Pulmonary Disease

## 2024-01-05 DIAGNOSIS — J449 Chronic obstructive pulmonary disease, unspecified: Secondary | ICD-10-CM

## 2024-01-05 NOTE — Progress Notes (Signed)
 Daily Session Note  Patient Details  Name: Anthony [name] MRN: [id number] Date of Birth: 07-13-1946 Referring Provider:   Conrad Ports Pulmonary Rehab Walk Test from 11/02/2023 in [facility], Vascular, [facility]  Referring Provider Briones       Encounter Date: 01/05/2024  Check In:  Session Check In - 01/05/24 1029       Check-In   Supervising physician immediately available to respond to emergencies [facility] immediately available    Physician(s) Barnie Press, NP    [facility]    Staff Present Ronal Levin, RN, BSN;Cece Milhouse Midge HECKLE, ACSM-CEP, Exercise Physiologist;Casey Claudene Neita Moats, MS, ACSM-CEP, Exercise Physiologist    Virtual Visit No    Medication changes reported     No    Fall or balance concerns reported    No    Tobacco Cessation No Change    Warm-up and Cool-down Performed as group-led instruction    Resistance Training Performed Yes    VAD Patient? No    PAD/SET Patient? No      Pain Assessment   Currently in Pain? No/denies    Multiple Pain Sites No             Capillary Blood Glucose: No results found for this or any previous visit (from the past 24 hours).    Social History   Tobacco Use  Smoking Status Former   Current packs/day: 0.00   Average packs/day: 2.0 packs/day for 28.0 years (56.0 ttl pk-yrs)   Types: Cigarettes   Start date: 08/03/1959   Quit date: 08/03/1987   Years since quitting: 36.4  Smokeless Tobacco Never    Goals Met:  Independence with exercise equipment Exercise tolerated well No report of concerns or symptoms today Strength training completed today  Goals Unmet:  Not Applicable  Comments: Service time is from 1005 to 1138.    Dr. Slater Staff is Medical Director for Pulmonary Rehab at [facility].

## 2024-01-10 ENCOUNTER — Encounter (HOSPITAL_COMMUNITY)
Admission: RE | Admit: 2024-01-10 | Discharge: 2024-01-10 | Discharge status: Home / Self Care | Payer: No Typology Code available for payment source | Source: Ambulatory Visit | Attending: Pulmonary Disease

## 2024-01-10 DIAGNOSIS — J449 Chronic obstructive pulmonary disease, unspecified: Secondary | ICD-10-CM

## 2024-01-10 NOTE — Progress Notes (Signed)
 Daily Session Note  Patient Details  Name: Anthony [name] MRN: [id number] Date of Birth: [birth year] Referring Provider:   Conrad Ports Pulmonary Rehab Walk Test from 11/02/2023 in [facility], Vascular, & [facility]  Referring Provider Briones       Encounter Date: 01/10/2024  Check In:  Session Check In - 01/10/24 1057       Check-In   Supervising physician immediately available to respond to emergencies [facility] immediately available    Physician(s) Kathyrn Lawerance, NP    [facility]    Staff Present Ronal Levin, RN, BSN;Randi Midge HECKLE, ACSM-CEP, Exercise Physiologist;Jimmy Stipes Claudene Neita Moats, MS, ACSM-CEP, Exercise Physiologist    Virtual Visit No    Medication changes reported     No    Fall or balance concerns reported    No    Tobacco Cessation No Change    Warm-up and Cool-down Performed as group-led instruction    Resistance Training Performed Yes    VAD Patient? No    PAD/SET Patient? No      Pain Assessment   Currently in Pain? No/denies    Pain Score 0-No pain    Multiple Pain Sites No             Capillary Blood Glucose: No results found for this or any previous visit (from the past 24 hours).    Social History   Tobacco Use  Smoking Status Former   Current packs/day: 0.00   Average packs/day: 2.0 packs/day for 28.0 years (56.0 ttl pk-yrs)   Types: Cigarettes   Start date: 08/03/1959   Quit date: 08/03/1987   Years since quitting: 36.4  Smokeless Tobacco Never    Goals Met:  Proper associated with RPD/PD & O2 Sat Independence with exercise equipment Exercise tolerated well No report of concerns or symptoms today Strength training completed today  Goals Unmet:  Not Applicable  Comments: Service time is from 1007 to 1135.    Dr. Slater Staff is Medical Director for Pulmonary Rehab at [facility].

## 2024-01-12 ENCOUNTER — Encounter (HOSPITAL_COMMUNITY)
Admission: RE | Admit: 2024-01-12 | Discharge: 2024-01-12 | Disposition: A | Discharge status: Home / Self Care | Payer: No Typology Code available for payment source | Source: Ambulatory Visit | Attending: Pulmonary Disease | Admitting: Pulmonary Disease

## 2024-01-12 DIAGNOSIS — J449 Chronic obstructive pulmonary disease, unspecified: Secondary | ICD-10-CM | POA: Diagnosis not present

## 2024-01-12 NOTE — Progress Notes (Signed)
Daily Session Note  Patient Details  Name: Anthony [name] MRN: [id number] Date of Birth: [birth year] Referring Provider:   Doristine Devoid Pulmonary Rehab Walk Test from 11/02/2023 in [facility], Vascular, & [facility]  Referring Provider Briones       Encounter Date: 01/12/2024  Check In:  Session Check In - 01/12/24 4098       Check-In   Supervising physician immediately available to respond to emergencies [facility] immediately available    Physician(s) Bernadene Person, NP    [facility]    Staff Present Essie Hart, RN, BSN;Randi Dionisio Paschal, ACSM-CEP, Exercise Physiologist;Casey Charlean Sanfilippo, MS, ACSM-CEP, Exercise Physiologist    Virtual Visit No    Medication changes reported     No    Fall or balance concerns reported    No    Tobacco Cessation No Change    Warm-up and Cool-down Performed as group-led instruction    Resistance Training Performed Yes    VAD Patient? No    PAD/SET Patient? No      Pain Assessment   Currently in Pain? No/denies             Capillary Blood Glucose: No results found for this or any previous visit (from the past 24 hours).    Social History   Tobacco Use  Smoking Status Former   Current packs/day: 0.00   Average packs/day: 2.0 packs/day for 28.0 years (56.0 ttl pk-yrs)   Types: Cigarettes   Start date: 08/03/1959   Quit date: 08/03/1987   Years since quitting: 36.4  Smokeless Tobacco Never    Goals Met:  Independence with exercise equipment Exercise tolerated well No report of concerns or symptoms today Strength training completed today  Goals Unmet:  Not Applicable  Comments: Service time is from 0806 to 0816    Dr. Mechele Collin is Medical Director for Pulmonary Rehab at [facility].

## 2024-01-17 ENCOUNTER — Encounter (HOSPITAL_COMMUNITY)
Admission: RE | Admit: 2024-01-17 | Discharge: 2024-01-17 | Disposition: A | Discharge status: Home / Self Care | Payer: No Typology Code available for payment source | Source: Ambulatory Visit | Attending: Pulmonary Disease | Admitting: Pulmonary Disease

## 2024-01-17 VITALS — Wt 222.0 lb

## 2024-01-17 DIAGNOSIS — J449 Chronic obstructive pulmonary disease, unspecified: Secondary | ICD-10-CM

## 2024-01-17 NOTE — Progress Notes (Signed)
Daily Session Note  Patient Details  Name: Anthony [name] MRN: [id number] Date of Birth: Jul 03, 1946 Referring Provider:   Doristine Devoid Pulmonary Rehab Walk Test from 11/02/2023 in [facility] for [facility], Vascular, & Lung [facility]  Referring Provider Briones       Encounter Date: 01/17/2024  Check In:  Session Check In - 01/17/24 1130       Check-In   Supervising physician immediately available to respond to emergencies [facility] immediately available    Physician(s) Bernadene Person, NP    [facility]    Staff Present Essie Hart, RN, BSN;Casey Katrinka Blazing, Zella Richer, MS, ACSM-CEP, Exercise Physiologist;Johnny Hale Bogus, MS, Exercise Physiologist    Virtual Visit No    Medication changes reported     No    Fall or balance concerns reported    No    Tobacco Cessation No Change    Warm-up and Cool-down Performed as group-led instruction    Resistance Training Performed Yes    VAD Patient? No    PAD/SET Patient? No      Pain Assessment   Currently in Pain? No/denies    Pain Score 0-No pain    Multiple Pain Sites No             Capillary Blood Glucose: No results found for this or any previous visit (from the past 24 hours).   Exercise Prescription Changes - 01/17/24 1200       Response to Exercise   Blood Pressure (Admit) 98/50    Blood Pressure (Exercise) 110/58    Blood Pressure (Exit) 94/48    Heart Rate (Admit) 73 bpm    Heart Rate (Exercise) 88 bpm    Heart Rate (Exit) 82 bpm    Oxygen Saturation (Admit) 92 %    Oxygen Saturation (Exercise) 88 %    Oxygen Saturation (Exit) 93 %    Rating of Perceived Exertion (Exercise) 12    Perceived Dyspnea (Exercise) 2    Duration Continue with 30 min of aerobic exercise without signs/symptoms of physical distress.    Intensity THRR unchanged      Progression   Progression Continue to progress workloads to maintain intensity without signs/symptoms of physical distress.       Resistance Training   Training Prescription Yes    Weight black bands    Reps 10-15    Time 10 Minutes      Treadmill   MPH 1.8    Grade 0    Minutes 15    METs 2.2      NuStep   Level 4    SPM 88    Minutes 15    METs 3.5             Social History   Tobacco Use  Smoking Status Former   Current packs/day: 0.00   Average packs/day: 2.0 packs/day for 28.0 years (56.0 ttl pk-yrs)   Types: Cigarettes   Start date: 08/03/1959   Quit date: 08/03/1987   Years since quitting: 36.4  Smokeless Tobacco Never    Goals Met:  Independence with exercise equipment Exercise tolerated well No report of concerns or symptoms today Strength training completed today  Goals Unmet:  Not Applicable  Comments: Service time is from 1007 to 1133    Dr. Mechele Collin is Medical Director for Pulmonary Rehab at [facility]

## 2024-01-18 NOTE — Progress Notes (Signed)
Pulmonary Individual Treatment Plan  Patient Details  Name: Anthony [name] MRN: [id number] Date of Birth: 1946/01/06 Referring Provider:   Doristine Devoid Pulmonary Rehab Walk Test from 11/02/2023 in [facility]  Referring Provider Briones       Initial Encounter Date:  Flowsheet Row Pulmonary Rehab Walk Test from 11/02/2023 in [facility]  Date 11/02/23       Visit Diagnosis: Stage 2 moderate COPD by GOLD classification (HCC)  Patient's Home Medications on Admission:   Current Outpatient Medications:    albuterol (VENTOLIN HFA) 108 (90 Base) MCG/ACT inhaler, Inhale 2 puffs into the lungs every 6 (six) hours as needed for wheezing or shortness of breath., Disp: , Rfl:    ASPIRIN LOW DOSE 81 MG EC tablet, Take 81 mg by mouth at bedtime., Disp: , Rfl:    b complex vitamins capsule, Take 1 capsule by mouth daily., Disp: , Rfl:    BENFOTIAMINE PO, Take 1 tablet by mouth 2 (two) times daily., Disp: , Rfl:    bisoprolol (ZEBETA) 5 MG tablet, Take 1 tablet by mouth every evening., Disp: , Rfl:    carboxymethylcellulose 1 % ophthalmic solution, Apply 1 drop to eye 3 (three) times daily., Disp: , Rfl:    CINNAMON PO, Take 1 tablet by mouth daily., Disp: , Rfl:    doxazosin (CARDURA) 2 MG tablet, Take 2 mg by mouth every evening., Disp: , Rfl:    doxycycline (ADOXA) 50 MG tablet, Take 50 mg by mouth daily at 12 noon., Disp: , Rfl:    empagliflozin (JARDIANCE) 25 MG TABS tablet, Take 12.5 mg by mouth daily., Disp: , Rfl:    fluticasone (FLONASE) 50 MCG/ACT nasal spray, Place 2 sprays into both nostrils at bedtime as needed for allergies or rhinitis. , Disp: , Rfl:    furosemide (LASIX) 20 MG tablet, Take 20 mg by mouth daily at 12 noon. , Disp: , Rfl:    gabapentin (NEURONTIN) 100 MG capsule, Take 200 mg by mouth every evening., Disp: , Rfl:    GARLIC PO, Take 1 tablet by mouth  daily., Disp: , Rfl:    hydrocortisone 2.5 % ointment, Apply 1 Application topically daily as needed (skin irritation on face)., Disp: , Rfl:    insulin glargine-yfgn (SEMGLEE, YFGN,) 100 UNIT/ML Pen, Inject 15 Units into the skin every evening., Disp: , Rfl:    Insulin Pen Needle (PEN NEEDLES) 32G X 4 MM MISC, Inject q daily with Rx of Lantus DxE-11.22 for 90 days, Disp: , Rfl:    isosorbide mononitrate (IMDUR) 30 MG 24 hr tablet, TAKE 1 TABLET BY MOUTH  DAILY, Disp: 90 tablet, Rfl: 3   L-LYSINE PO, Take 1 tablet by mouth daily., Disp: , Rfl:    loratadine (CLARITIN) 10 MG tablet, Take 10 mg by mouth daily., Disp: , Rfl:    losartan (COZAAR) 100 MG tablet, Take 100 mg by mouth daily., Disp: , Rfl:    metFORMIN (GLUCOPHAGE-XR) 500 MG 24 hr tablet, Take 1,000 mg by mouth 2 (two) times daily., Disp: , Rfl:    Mometasone Furoate 200 MCG/ACT AERO, Take 200 mcg by mouth daily., Disp: , Rfl:    Multiple Vitamin (MULTIVITAMIN) tablet, Take 1 tablet by mouth daily., Disp: , Rfl:    nitroGLYCERIN (NITROSTAT) 0.4 MG SL tablet, Place 1 tablet (0.4 mg total) under the tongue every 5 (five) minutes as needed for chest  pain., Disp: 90 tablet, Rfl: 3   Omega-3 Fatty Acids (FISH OIL PO), Take 1 capsule by mouth 2 (two) times daily., Disp: , Rfl:    pentoxifylline (TRENTAL) 400 MG CR tablet, TAKE 1 TABLET BY MOUTH  TWICE DAILY, Disp: 180 tablet, Rfl: 3   potassium chloride SA (KLOR-CON M) 20 MEQ tablet, Take 20 mEq by mouth daily., Disp: , Rfl:    Probiotic Product (PROBIOTIC PO), Take 1 capsule by mouth every evening., Disp: , Rfl:    rOPINIRole (REQUIP) 1 MG tablet, Take 1 mg by mouth every evening. , Disp: , Rfl:    Semaglutide, 2 MG/DOSE, (OZEMPIC, 2 MG/DOSE,) 8 MG/3ML SOPN, Inject 2 mg into the skin See admin instructions. Inject 2 mg once a week on Thursdays., Disp: , Rfl:    simvastatin (ZOCOR) 40 MG tablet, Take 40 mg by mouth every evening., Disp: , Rfl:    triamcinolone ointment (KENALOG) 0.1 %, Apply 1  Application topically 2 (two) times daily., Disp: , Rfl:    umeclidinium-vilanterol (ANORO ELLIPTA) 62.5-25 MCG/INH AEPB, INHALE 1 INHALATION BY  MOUTH INTO THE LUNGS DAILY, Disp: 180 each, Rfl: 3  Past Medical History: Past Medical History:  Diagnosis Date   Atherosclerosis of native artery of both lower extremities with intermittent claudication (HCC)    followed by vascular , dr Myra Gianotti;  s/p angioplasty and stenting bilaterally 2015 and 11/ 2021 (per lov note 06-08-2021 bilateral iliofemoral intervention's are widely patent)   CAD (coronary artery disease)    cardiologist-- dr Gala Romney;  s/p stent placed 06/02/2000 in [place];  s/p cath with patent LCx , PCI w/ DES to pLAD and PCI to POBA of D1   Chronic diastolic (congestive) heart failure (HCC)    followed by cardiology   COPD (chronic obstructive pulmonary disease) with chronic bronchitis [facility])    pulmonology-- dr Craige Cotta--  (09-23-2021  per pt checks O2 stats at home, [place] 96-97% on RA)   Edema of left lower extremity    per pt since vasuclar surgery 11/ 2021, but told mild   History of COVID-19 06/2021   per pt mild symtpoms that resolved   Hyperlipidemia    Hypertension    followed by cardiology and pcp   OA (osteoarthritis)    OSA on CPAP    followed by dr Craige Cotta--  study in epic 08-23-2012 severe osa (no additional oxygen)   PAD (peripheral artery disease) (HCC)    Peripheral neuropathy    feet   Peripheral vascular disease (HCC)    Rosacea    S/P drug eluting coronary stent placement    2001  x1 stent (unsure if DES);  05/ 2020 DES to pLAD   Shortness of breath    with exertion with stairs but recovers quickly,  ok w/ household chores   Type 2 diabetes mellitus (HCC)    followed by pcp   (09-23-2021 per pt check blood sugar at home twice weekly in am,  fasting sugar-- 170)    Tobacco Use: Social History   Tobacco Use  Smoking Status Former   Current packs/day: 0.00   Average packs/day: 2.0 packs/day for 28.0  years (56.0 ttl pk-yrs)   Types: Cigarettes   Start date: 08/03/1959   Quit date: 08/03/1987   Years since quitting: 36.4  Smokeless Tobacco Never    Labs: Review Flowsheet  More data exists      Latest Ref Rng & Units 05/11/2019 09/16/2020 11/06/2020 10/04/2023 10/05/2023  Labs for ITP Cardiac and Pulmonary Rehab  Cholestrol 0 - 200 mg/dL - - 469  - 629   LDL (calc) 0 - 99 mg/dL - - 52  - 51   HDL-C >52 mg/dL - - 50  - 44   Trlycerides <150 mg/dL - - 81  - 841   Hemoglobin A1c 4.8 - 5.6 % - - - 7.7  -  PH, Arterial 7.350 - 7.450 7.299  - - - -  PCO2 arterial 32.0 - 48.0 mmHg 42.8  - - - -  Bicarbonate 20.0 - 28.0 mmol/L 23.8  22.7  21.0  - - - -  TCO2 22 - 32 mmol/L 25  24  22  25   - - -  Acid-base deficit 0.0 - 2.0 mmol/L 4.0  5.0  5.0  - - - -  O2 Saturation % 71.0  69.0  98.0  - - - -    Details       Multiple values from one day are sorted in reverse-chronological order         Capillary Blood Glucose: Lab Results  Component Value Date   GLUCAP 134 (H) 11/10/2023   GLUCAP 127 (H) 11/10/2023   GLUCAP 162 (H) 11/08/2023   GLUCAP 174 (H) 11/08/2023   GLUCAP 184 (H) 10/06/2023    POCT Glucose     Row Name 11/02/23 1042             POCT Blood Glucose   Pre-Exercise 105 mg/dL                Pulmonary Assessment Scores:  Pulmonary Assessment Scores     Row Name 11/02/23 1216 01/10/24 1202       ADL UCSD   ADL Phase Entry Exit    SOB Score total 32 23      CAT Score   CAT Score 5 6      mMRC Score   mMRC Score 3 --            UCSD: Self-administered rating of dyspnea associated with activities of daily living (ADLs) 6-point scale (0 = "not at all" to 5 = "maximal or unable to do because of breathlessness")  Scoring Scores range from 0 to 120.  Minimally important difference is 5 units  CAT: CAT can identify the health impairment of COPD patients and is better correlated with disease progression.  CAT has a scoring range of zero to 40. The  CAT score is classified into four groups of low (less than 10), medium (10 - 20), high (21-30) and very high (31-40) based on the impact level of disease on health status. A CAT score over 10 suggests significant symptoms.  A worsening CAT score could be explained by an exacerbation, poor medication adherence, poor inhaler technique, or progression of COPD or comorbid conditions.  CAT MCID is 2 points  mMRC: mMRC (Modified Medical Research Council) Dyspnea Scale is used to assess the degree of baseline functional disability in patients of respiratory disease due to dyspnea. No minimal important difference is established. A decrease in score of 1 point or greater is considered a positive change.   Pulmonary Function Assessment:  Pulmonary Function Assessment - 11/02/23 1051       Breath   Bilateral Breath Sounds Clear    Shortness of Breath Yes;Limiting activity             Exercise Target Goals: Exercise Program Goal: Individual exercise prescription set using results from initial 6 min walk test and THRR while considering  patient's activity barriers and safety.   Exercise Prescription Goal: Initial exercise prescription builds to 30-45 minutes a day of aerobic activity, 2-3 days per week.  Home exercise guidelines will be given to patient during program as part of exercise prescription that the participant will acknowledge.  Activity Barriers & Risk Stratification:  Activity Barriers & Cardiac Risk Stratification - 11/02/23 1048       Activity Barriers & Cardiac Risk Stratification   Activity Barriers Deconditioning;Muscular Weakness;Shortness of Breath             6 Minute Walk:  6 Minute Walk     Row Name 11/02/23 1128         6 Minute Walk   Phase Initial     Distance 925 feet     Walk Time 6 minutes     # of Rest Breaks 3  2:09-2:36, 3:49-4:10, 5:30-6:00     MPH 1.75     METS 1.25     RPE 15     Perceived Dyspnea  3     VO2 Peak 4.38     Symptoms No      Resting HR 70 bpm     Resting BP 110/60     Resting Oxygen Saturation  94 %     Exercise Oxygen Saturation  during 6 min walk 92 %     Max Ex. HR 91 bpm     Max Ex. BP 124/56     2 Minute Post BP 112/52       Interval HR   1 Minute HR 80     2 Minute HR 79     3 Minute HR 90     4 Minute HR 88     5 Minute HR 86     6 Minute HR 84  91 @ 5:45     2 Minute Post HR 75     Interval Heart Rate? Yes       Interval Oxygen   Interval Oxygen? Yes     Baseline Oxygen Saturation % 95 %     1 Minute Oxygen Saturation % 94 %     1 Minute Liters of Oxygen 0 L     2 Minute Oxygen Saturation % 92 %     2 Minute Liters of Oxygen 0 L     3 Minute Oxygen Saturation % 92 %     3 Minute Liters of Oxygen 0 L     4 Minute Oxygen Saturation % 93 %     4 Minute Liters of Oxygen 0 L     5 Minute Oxygen Saturation % 92 %     5 Minute Liters of Oxygen 0 L     6 Minute Oxygen Saturation % 93 %     6 Minute Liters of Oxygen 0 L     2 Minute Post Oxygen Saturation % 97 %     2 Minute Post Liters of Oxygen 0 L              Oxygen Initial Assessment:  Oxygen Initial Assessment - 11/02/23 1050       Home Oxygen   Home Oxygen Device None    Sleep Oxygen Prescription None    Home Exercise Oxygen Prescription None    Home Resting Oxygen Prescription None    Compliance with Home Oxygen Use No      Initial 6 min Walk   Oxygen Used None  Program Oxygen Prescription   Program Oxygen Prescription None      Intervention   Short Term Goals To learn and understand importance of maintaining oxygen saturations>88%;To learn and demonstrate proper use of respiratory medications;To learn and understand importance of monitoring SPO2 with pulse oximeter and demonstrate accurate use of the pulse oximeter.;To learn and demonstrate proper pursed lip breathing techniques or other breathing techniques.     Long  Term Goals Maintenance of O2 saturations>88%;Compliance with respiratory medication;Verbalizes  importance of monitoring SPO2 with pulse oximeter and return demonstration;Exhibits proper breathing techniques, such as pursed lip breathing or other method taught during program session;Demonstrates proper use of MDI's             Oxygen Re-Evaluation:  Oxygen Re-Evaluation     Row Name 11/16/23 1148 12/12/23 0947 01/12/24 0759         Program Oxygen Prescription   Program Oxygen Prescription None None None       Home Oxygen   Home Oxygen Device None None None     Sleep Oxygen Prescription None None None     Home Exercise Oxygen Prescription None None None     Home Resting Oxygen Prescription None None None     Compliance with Home Oxygen Use No No No       Goals/Expected Outcomes   Short Term Goals To learn and understand importance of maintaining oxygen saturations>88%;To learn and demonstrate proper use of respiratory medications;To learn and understand importance of monitoring SPO2 with pulse oximeter and demonstrate accurate use of the pulse oximeter.;To learn and demonstrate proper pursed lip breathing techniques or other breathing techniques.  To learn and understand importance of maintaining oxygen saturations>88%;To learn and demonstrate proper use of respiratory medications;To learn and understand importance of monitoring SPO2 with pulse oximeter and demonstrate accurate use of the pulse oximeter.;To learn and demonstrate proper pursed lip breathing techniques or other breathing techniques.  To learn and understand importance of maintaining oxygen saturations>88%;To learn and demonstrate proper use of respiratory medications;To learn and understand importance of monitoring SPO2 with pulse oximeter and demonstrate accurate use of the pulse oximeter.;To learn and demonstrate proper pursed lip breathing techniques or other breathing techniques.      Long  Term Goals Maintenance of O2 saturations>88%;Compliance with respiratory medication;Verbalizes importance of monitoring SPO2  with pulse oximeter and return demonstration;Exhibits proper breathing techniques, such as pursed lip breathing or other method taught during program session;Demonstrates proper use of MDI's Maintenance of O2 saturations>88%;Compliance with respiratory medication;Verbalizes importance of monitoring SPO2 with pulse oximeter and return demonstration;Exhibits proper breathing techniques, such as pursed lip breathing or other method taught during program session;Demonstrates proper use of MDI's Maintenance of O2 saturations>88%;Compliance with respiratory medication;Verbalizes importance of monitoring SPO2 with pulse oximeter and return demonstration;Exhibits proper breathing techniques, such as pursed lip breathing or other method taught during program session;Demonstrates proper use of MDI's     Goals/Expected Outcomes Compliance and understanding of oxygen saturation monitoring and breathing techniques to decrease shortness of breath. Compliance and understanding of oxygen saturation monitoring and breathing techniques to decrease shortness of breath. Compliance and understanding of oxygen saturation monitoring and breathing techniques to decrease shortness of breath.              Oxygen Discharge (Final Oxygen Re-Evaluation):  Oxygen Re-Evaluation - 01/12/24 0759       Program Oxygen Prescription   Program Oxygen Prescription None      Home Oxygen   Home Oxygen Device None  Sleep Oxygen Prescription None    Home Exercise Oxygen Prescription None    Home Resting Oxygen Prescription None    Compliance with Home Oxygen Use No      Goals/Expected Outcomes   Short Term Goals To learn and understand importance of maintaining oxygen saturations>88%;To learn and demonstrate proper use of respiratory medications;To learn and understand importance of monitoring SPO2 with pulse oximeter and demonstrate accurate use of the pulse oximeter.;To learn and demonstrate proper pursed lip breathing techniques  or other breathing techniques.     Long  Term Goals Maintenance of O2 saturations>88%;Compliance with respiratory medication;Verbalizes importance of monitoring SPO2 with pulse oximeter and return demonstration;Exhibits proper breathing techniques, such as pursed lip breathing or other method taught during program session;Demonstrates proper use of MDI's    Goals/Expected Outcomes Compliance and understanding of oxygen saturation monitoring and breathing techniques to decrease shortness of breath.             Initial Exercise Prescription:  Initial Exercise Prescription - 11/02/23 1100       Date of Initial Exercise RX and Referring Provider   Date 11/02/23    Referring Provider Briones    Expected Discharge Date 01/26/24      NuStep   Level 1    SPM 60    Minutes 15    METs 1.5      Track   Minutes 15    METs 1.5      Prescription Details   Frequency (times per week) 2    Duration Progress to 30 minutes of continuous aerobic without signs/symptoms of physical distress      Intensity   THRR 40-80% of Max Heartrate 56-113    Ratings of Perceived Exertion 11-13    Perceived Dyspnea 0-4      Progression   Progression Continue to progress workloads to maintain intensity without signs/symptoms of physical distress.      Resistance Training   Training Prescription Yes    Weight red bands    Reps 10-15             Perform Capillary Blood Glucose checks as needed.  Exercise Prescription Changes:   Exercise Prescription Changes     Row Name 11/08/23 1100 11/22/23 1100 12/06/23 1100 12/20/23 1200 01/03/24 1100     Response to Exercise   Blood Pressure (Admit) 110/50 90/50 106/60 104/52 118/62   Blood Pressure (Exercise) 140/66 138/62 116/60 118/62 110/50   Blood Pressure (Exit) 95/63 90/50 90/56  96/44 98/61   Heart Rate (Admit) 68 bpm 68 bpm 69 bpm 73 bpm 78 bpm   Heart Rate (Exercise) 86 bpm 101 bpm 92 bpm 91 bpm 90 bpm   Heart Rate (Exit) 82 bpm 80 bpm 77 bpm  92 bpm 84 bpm   Oxygen Saturation (Admit) 94 % 91 % 93 % 73 % 93 %   Oxygen Saturation (Exercise) 95 % 91 % 93 % 92 % 89 %   Oxygen Saturation (Exit) 93 % 94 % 93 % 92 % 91 %   Rating of Perceived Exertion (Exercise) 15 14 13 13 14    Perceived Dyspnea (Exercise) 3 3 3 3 3    Duration Continue with 30 min of aerobic exercise without signs/symptoms of physical distress. Continue with 30 min of aerobic exercise without signs/symptoms of physical distress. Continue with 30 min of aerobic exercise without signs/symptoms of physical distress. Continue with 30 min of aerobic exercise without signs/symptoms of physical distress. Continue with 30 min of aerobic exercise  without signs/symptoms of physical distress.   Intensity THRR unchanged THRR unchanged THRR unchanged THRR unchanged THRR unchanged     Progression   Progression Continue to progress workloads to maintain intensity without signs/symptoms of physical distress. Continue to progress workloads to maintain intensity without signs/symptoms of physical distress. Continue to progress workloads to maintain intensity without signs/symptoms of physical distress. Continue to progress workloads to maintain intensity without signs/symptoms of physical distress. Continue to progress workloads to maintain intensity without signs/symptoms of physical distress.   Average METs -- -- -- 2.5 --     Resistance Training   Training Prescription Yes Yes Yes Yes Yes   Weight red bands blue bands blue bands blue bands black bands   Reps 10-15 10-15 10-15 10-15 10-15   Time 10 Minutes 10 Minutes 10 Minutes 10 Minutes 10 Minutes     NuStep   Level 2 3 3 3 4    SPM -- 93 -- 90 --   Minutes 15 15 15 15 15    METs 2 2.6 2.6 2.5 2.6     Track   Laps 7 8 8 7 7    Minutes 15 15 15 15 15    METs 2.08 2.23 2.23 2.09 2.09    Row Name 01/17/24 1200             Response to Exercise   Blood Pressure (Admit) 98/50       Blood Pressure (Exercise) 110/58       Blood  Pressure (Exit) 94/48       Heart Rate (Admit) 73 bpm       Heart Rate (Exercise) 88 bpm       Heart Rate (Exit) 82 bpm       Oxygen Saturation (Admit) 92 %       Oxygen Saturation (Exercise) 88 %       Oxygen Saturation (Exit) 93 %       Rating of Perceived Exertion (Exercise) 12       Perceived Dyspnea (Exercise) 2       Duration Continue with 30 min of aerobic exercise without signs/symptoms of physical distress.       Intensity THRR unchanged         Progression   Progression Continue to progress workloads to maintain intensity without signs/symptoms of physical distress.         Resistance Training   Training Prescription Yes       Weight black bands       Reps 10-15       Time 10 Minutes         Treadmill   MPH 1.8       Grade 0       Minutes 15       METs 2.2         NuStep   Level 4       SPM 88       Minutes 15       METs 3.5                Exercise Comments:   Exercise Comments     Row Name 12/12/23 0948           Exercise Comments Completed home exercise plan. Raffaele is not currently exercising at home. He access to a fitness center at his home. I encouraged Justyce to use the fitness center and exercise on the bike or other aerobic equipment at least 1 non-rehab day/wk for 30 min/day.  He agreed with my recommendations. I asked Jahcari to show me a picture of the fitness center so I could advise on what equipment would benefit him the best. Will follow up on exercise equipment. Otho seems motivated to exercise and decrease his shortness of breath.                Exercise Goals and Review:   Exercise Goals     Row Name 11/02/23 1048             Exercise Goals   Increase Physical Activity Yes       Intervention Provide advice, education, support and counseling about physical activity/exercise needs.;Develop an individualized exercise prescription for aerobic and resistive training based on initial evaluation findings, risk stratification,  comorbidities and participant's personal goals.       Expected Outcomes Short Term: Attend rehab on a regular basis to increase amount of physical activity.;Long Term: Exercising regularly at least 3-5 days a week.;Long Term: Add in home exercise to make exercise part of routine and to increase amount of physical activity.       Increase Strength and Stamina Yes       Intervention Provide advice, education, support and counseling about physical activity/exercise needs.;Develop an individualized exercise prescription for aerobic and resistive training based on initial evaluation findings, risk stratification, comorbidities and participant's personal goals.       Expected Outcomes Short Term: Increase workloads from initial exercise prescription for resistance, speed, and METs.;Short Term: Perform resistance training exercises routinely during rehab and add in resistance training at home;Long Term: Improve cardiorespiratory fitness, muscular endurance and strength as measured by increased METs and functional capacity ( )       Able to understand and use rate of perceived exertion (RPE) scale Yes       Intervention Provide education and explanation on how to use RPE scale       Expected Outcomes Short Term: Able to use RPE daily in rehab to express subjective intensity level;Long Term:  Able to use RPE to guide intensity level when exercising independently       Able to understand and use Dyspnea scale Yes       Intervention Provide education and explanation on how to use Dyspnea scale       Expected Outcomes Short Term: Able to use Dyspnea scale daily in rehab to express subjective sense of shortness of breath during exertion;Long Term: Able to use Dyspnea scale to guide intensity level when exercising independently       Knowledge and understanding of Target Heart Rate Range (THRR) Yes       Intervention Provide education and explanation of THRR including how the numbers were predicted and where they  are located for reference       Expected Outcomes Short Term: Able to state/look up THRR;Short Term: Able to use daily as guideline for intensity in rehab;Long Term: Able to use THRR to govern intensity when exercising independently       Understanding of Exercise Prescription Yes       Intervention Provide education, explanation, and written materials on patient's individual exercise prescription       Expected Outcomes Short Term: Able to explain program exercise prescription;Long Term: Able to explain home exercise prescription to exercise independently                Exercise Goals Re-Evaluation :  Exercise Goals Re-Evaluation     Row Name 11/16/23 1141 12/12/23 0945 01/12/24 4132  Exercise Goal Re-Evaluation   Exercise Goals Review Increase Physical Activity;Able to understand and use Dyspnea scale;Understanding of Exercise Prescription;Increase Strength and Stamina;Knowledge and understanding of Target Heart Rate Range (THRR);Able to understand and use rate of perceived exertion (RPE) scale Increase Physical Activity;Able to understand and use Dyspnea scale;Understanding of Exercise Prescription;Increase Strength and Stamina;Knowledge and understanding of Target Heart Rate Range (THRR);Able to understand and use rate of perceived exertion (RPE) scale Increase Physical Activity;Able to understand and use Dyspnea scale;Understanding of Exercise Prescription;Increase Strength and Stamina;Knowledge and understanding of Target Heart Rate Range (THRR);Able to understand and use rate of perceived exertion (RPE) scale     Comments Gurshawn has completed 3 exercise sessions. He exercises for 15 min on the track and Nustep. He averages 2.23 METs on the track and 2.5 METs at level 2 on the Nustep. He performs the warmup and cooldown standing without limitations. It is too soon to notate any discernable progressions. Will continue to monitor and progess as able. Ariana has completed 9 exercise  sessions. He exercises for 15 min on the track and Nustep. He averages 2.23 METs on the track and 2.7 METs at level 3 on the Nustep. He performs the warmup and cooldown standing without limitations. Edie has increased his level for the Nustep as METs have remained relatively the same. Will encourage Constant to increase track laps and level on Nustep. We have recently discussed home exercise as Anothy is not exercising at home. I encouraged him to use a fitness center to exercise. Will continue to monitor and progess as able. Georffrey has completed 18 exercise sessions. He exercises for 15 min on the track and Nustep. He averages 1.9 METs at 1.4 mph on the treadmill and 2.6 METs at level 4 on the Nustep. He performs the warmup and cooldown standing without limitations. Raydel has increased his level for the Nustep as METs have remained relatively the same. He tolerates the increased level well. Ramces has also progressed from track walking to treadmill walking. We went over home exercise again as Jaquel mentioned exercising at the gym a couple of times. I am confident in [place] exercising outside of rehab. Will continue to monitor and progress as able.     Expected Outcomes Through exercise at rehab and home, the patient will decrease shortness of breath with daily activities and feel confident in carrying out an exercise regimen at home. Through exercise at rehab and home, the patient will decrease shortness of breath with daily activities and feel confident in carrying out an exercise regimen at home. Through exercise at rehab and home, the patient will decrease shortness of breath with daily activities and feel confident in carrying out an exercise regimen at home.              Discharge Exercise Prescription (Final Exercise Prescription Changes):  Exercise Prescription Changes - 01/17/24 1200       Response to Exercise   Blood Pressure (Admit) 98/50    Blood Pressure (Exercise) 110/58    Blood  Pressure (Exit) 94/48    Heart Rate (Admit) 73 bpm    Heart Rate (Exercise) 88 bpm    Heart Rate (Exit) 82 bpm    Oxygen Saturation (Admit) 92 %    Oxygen Saturation (Exercise) 88 %    Oxygen Saturation (Exit) 93 %    Rating of Perceived Exertion (Exercise) 12    Perceived Dyspnea (Exercise) 2    Duration Continue with 30 min of aerobic exercise without signs/symptoms of  physical distress.    Intensity THRR unchanged      Progression   Progression Continue to progress workloads to maintain intensity without signs/symptoms of physical distress.      Resistance Training   Training Prescription Yes    Weight black bands    Reps 10-15    Time 10 Minutes      Treadmill   MPH 1.8    Grade 0    Minutes 15    METs 2.2      NuStep   Level 4    SPM 88    Minutes 15    METs 3.5             Nutrition:  Target Goals: Understanding of nutrition guidelines, daily intake of sodium 1500mg , cholesterol 200mg , calories 30% from fat and 7% or less from saturated fats, daily to have 5 or more servings of fruits and vegetables.  Biometrics:    Nutrition Therapy Plan and Nutrition Goals:  Nutrition Therapy & Goals - 11/08/23 1127       Nutrition Therapy   Diet Heart Healthy/Carbohydrate Consistent Diet    Drug/Food Interactions Statins/Certain Fruits      Personal Nutrition Goals   Nutrition Goal Patient to improve diet quality by using the plate method as a guide for meal planning to include lean protein/plant protein, fruits, vegetables, whole grains, nonfat dairy as part of a well-balanced diet.    Comments Concepcion has medical history of COPD, CAD, PAD, HTN, DM2, CKD3. His A1c remains above goal of <7% (7.7); he continues ozempic, metformin, semglee. He reports working on weight loss through diet and exercise. His spouse is supportive. Patient will benefit from participation in pulmonary rehab for nutrition, exercise, and lifestyle modification.      Intervention Plan    Intervention Prescribe, educate and counsel regarding individualized specific dietary modifications aiming towards targeted core components such as weight, hypertension, lipid management, diabetes, heart failure and other comorbidities.;Nutrition handout(s) given to patient.    Expected Outcomes Short Term Goal: Understand basic principles of dietary content, such as calories, fat, sodium, cholesterol and nutrients.;Long Term Goal: Adherence to prescribed nutrition plan.             Nutrition Assessments:  Nutrition Assessments - 01/10/24 1200       Rate Your Plate Scores   Post Score 48            MEDIFICTS Score Key: >=70 Need to make dietary changes  40-70 Heart Healthy Diet <= 40 Therapeutic Level Cholesterol Diet  Flowsheet Row PULMONARY REHAB CHRONIC OBSTRUCTIVE PULMONARY DISEASE from 11/08/2023 in [facility]  Picture Your Plate Total Score on Admission 46      Picture Your Plate Scores: <95 Unhealthy dietary pattern with much room for improvement. 41-50 Dietary pattern unlikely to meet recommendations for good health and room for improvement. 51-60 More healthful dietary pattern, with some room for improvement.  >60 Healthy dietary pattern, although there may be some specific behaviors that could be improved.    Nutrition Goals Re-Evaluation:  Nutrition Goals Re-Evaluation     Row Name 11/08/23 1127             Goals   Current Weight 229 lb 8 oz (104.1 kg)       Comment triglycerides 154, A1c 7.7, GFR WNL, LDL 51       Expected Outcome Teren has medical history of COPD, CAD, PAD, HTN, DM2, CKD3.  His A1c remains above goal of <7% (7.7); he continues ozempic, metformin, semglee. He reports working on weight loss through diet and exercise. His spouse is supportive. Patient will benefit from participation in pulmonary rehab for nutrition, exercise, and lifestyle modification.                 Nutrition Goals Discharge (Final Nutrition Goals Re-Evaluation):  Nutrition Goals Re-Evaluation - 11/08/23 1127       Goals   Current Weight 229 lb 8 oz (104.1 kg)    Comment triglycerides 154, A1c 7.7, GFR WNL, LDL 51    Expected Outcome Mustafe has medical history of COPD, CAD, PAD, HTN, DM2, CKD3. His A1c remains above goal of <7% (7.7); he continues ozempic, metformin, semglee. He reports working on weight loss through diet and exercise. His spouse is supportive. Patient will benefit from participation in pulmonary rehab for nutrition, exercise, and lifestyle modification.             Psychosocial: Target Goals: Acknowledge presence or absence of significant depression and/or stress, maximize coping skills, provide positive support system. Participant is able to verbalize types and ability to use techniques and skills needed for reducing stress and depression.  Initial Review & Psychosocial Screening:  Initial Psych Review & Screening - 11/02/23 1051       Initial Review   Current issues with None Identified      Family Dynamics   Good Support System? Yes    Comments Pt denies any psy/soc barriers      Barriers   Psychosocial barriers to participate in program There are no identifiable barriers or psychosocial needs.      Screening Interventions   Interventions Encouraged to exercise    Expected Outcomes Long Term Goal: Stressors or current issues are controlled or eliminated.;Short Term goal: Identification and review with participant of any Quality of Life or Depression concerns found by scoring the questionnaire.             Quality of Life Scores:  Scores of 19 and below usually indicate a poorer quality of life in these areas.  A difference of  2-3 points is a clinically meaningful difference.  A difference of 2-3 points in the total score of the Quality of Life Index has been associated with significant improvement in overall quality of life, self-image,  physical symptoms, and general health in studies assessing change in quality of life.  PHQ-9: Review Flowsheet       01/10/2024 11/02/2023  Depression screen PHQ 2/9  Decreased Interest 0 0  Down, Depressed, Hopeless 0 0  PHQ - 2 Score 0 0  Altered sleeping 0 0  Tired, decreased energy 0 0  Change in appetite 0 0  Feeling bad or failure about yourself  0 0  Trouble concentrating 0 0  Moving slowly or fidgety/restless 0 0  Suicidal thoughts 0 0  PHQ-9 Score 0 0  Difficult doing work/chores Not difficult at all Not difficult at all   Interpretation of Total Score  Total Score Depression Severity:  1-4 = Minimal depression, 5-9 = Mild depression, 10-14 = Moderate depression, 15-19 = Moderately severe depression, 20-27 = Severe depression   Psychosocial Evaluation and Intervention:  Psychosocial Evaluation - 11/02/23 1052       Psychosocial Evaluation & Interventions   Interventions Encouraged to exercise with the program and follow exercise prescription    Comments Pt denies any psy/soc barriers or concerns    Expected Outcomes For Geroge to particiapte  in PR without any psy/soc barriers    Continue Psychosocial Services  No Follow up required             Psychosocial Re-Evaluation:  Psychosocial Re-Evaluation     Row Name 11/16/23 0949 12/09/23 1412 01/13/24 3244         Psychosocial Re-Evaluation   Current issues with None Identified None Identified None Identified     Comments Elijaah denies any psychosocial barriers or concerns at this time. Elster continues to deny any psychosocial barriers or concerns at this time. Sergio is doing well in PR. He continues to deny any psychosocial barriers or concerns at this time.     Expected Outcomes For Kycen to participate in PR free of any psychosocial barriers or concerns. For Elfego to participate in PR free of any psychosocial barriers or concerns. For Burce to participate in PR free of any psychosocial barriers or  concerns.     Interventions Encouraged to attend Pulmonary Rehabilitation for the exercise Encouraged to attend Pulmonary Rehabilitation for the exercise Encouraged to attend Pulmonary Rehabilitation for the exercise     Continue Psychosocial Services  No Follow up required No Follow up required No Follow up required              Psychosocial Discharge (Final Psychosocial Re-Evaluation):  Psychosocial Re-Evaluation - 01/13/24 0102       Psychosocial Re-Evaluation   Current issues with None Identified    Comments Damauri is doing well in PR. He continues to deny any psychosocial barriers or concerns at this time.    Expected Outcomes For Raymund to participate in PR free of any psychosocial barriers or concerns.    Interventions Encouraged to attend Pulmonary Rehabilitation for the exercise    Continue Psychosocial Services  No Follow up required             Education: Education Goals: Education classes will be provided on a weekly basis, covering required topics. Participant will state understanding/return demonstration of topics presented.  Learning Barriers/Preferences:  Learning Barriers/Preferences - 11/02/23 1218       Learning Barriers/Preferences   Learning Barriers None    Learning Preferences Written Material;Group Instruction             Education Topics: Know Your Numbers Group instruction that is supported by a PowerPoint presentation. Instructor discusses importance of knowing and understanding resting, exercise, and post-exercise oxygen saturation, heart rate, and blood pressure. Oxygen saturation, heart rate, blood pressure, rating of perceived exertion, and dyspnea are reviewed along with a normal range for these values.  Flowsheet Row PULMONARY REHAB CHRONIC OBSTRUCTIVE PULMONARY DISEASE from 12/29/2023 in [facility] for Heart, Vascular, & Lung [facility]  Date 12/29/23  Educator EP  Instruction Review Code 1- Verbalizes  Understanding       Exercise for the Pulmonary Patient Group instruction that is supported by a PowerPoint presentation. Instructor discusses benefits of exercise, core components of exercise, frequency, duration, and intensity of an exercise routine, importance of utilizing pulse oximetry during exercise, safety while exercising, and options of places to exercise outside of rehab.  Flowsheet Row PULMONARY REHAB CHRONIC OBSTRUCTIVE PULMONARY DISEASE from 12/22/2023 in [facility] for Heart, Vascular, & Lung Health  Date 12/22/23  Educator EP  Instruction Review Code 1- Verbalizes Understanding       MET Level  Group instruction provided by PowerPoint, verbal discussion, and written material to support subject matter. Instructor reviews what METs are and how to  increase METs.  Flowsheet Row PULMONARY REHAB CHRONIC OBSTRUCTIVE PULMONARY DISEASE from 11/17/2023 in [facility] for Heart, Vascular, & Lung Health  Date 11/17/23  Educator EP  Instruction Review Code 1- Verbalizes Understanding       Pulmonary Medications Verbally interactive group education provided by instructor with focus on inhaled medications and proper administration. Flowsheet Row PULMONARY REHAB CHRONIC OBSTRUCTIVE PULMONARY DISEASE from 12/15/2023 in [facility] for Heart, Vascular, & Lung Health  Date 12/15/23  Educator RT  Instruction Review Code 1- Verbalizes Understanding       Anatomy and Physiology of the Respiratory System Group instruction provided by PowerPoint, verbal discussion, and written material to support subject matter. Instructor reviews respiratory cycle and anatomical components of the respiratory system and their functions. Instructor also reviews differences in obstructive and restrictive respiratory diseases with examples of each.  Flowsheet Row PULMONARY REHAB CHRONIC OBSTRUCTIVE PULMONARY DISEASE from 12/08/2023 in  [facility] for Heart, Vascular, & Lung Health  Date 12/08/23  Educator RT  Instruction Review Code 1- Verbalizes Understanding       Oxygen Safety Group instruction provided by PowerPoint, verbal discussion, and written material to support subject matter. There is an overview of "What is Oxygen" and "Why do we need it".  Instructor also reviews how to create a safe environment for oxygen use, the importance of using oxygen as prescribed, and the risks of noncompliance. There is a brief discussion on traveling with oxygen and resources the patient may utilize. Flowsheet Row PULMONARY REHAB CHRONIC OBSTRUCTIVE PULMONARY DISEASE from 01/05/2024 in [facility] for Heart, Vascular, & Lung Health  Date 01/05/24  Educator RN  Instruction Review Code 1- Verbalizes Understanding       Oxygen Use Group instruction provided by PowerPoint, verbal discussion, and written material to discuss how supplemental oxygen is prescribed and different types of oxygen supply systems. Resources for more information are provided.  Flowsheet Row PULMONARY REHAB CHRONIC OBSTRUCTIVE PULMONARY DISEASE from 01/12/2024 in [facility] for Heart, Vascular, & Lung Health  Date 01/12/24  Educator RT  Instruction Review Code 1- Verbalizes Understanding       Breathing Techniques Group instruction that is supported by demonstration and informational handouts. Instructor discusses the benefits of pursed lip and diaphragmatic breathing and detailed demonstration on how to perform both.     Risk Factor Reduction Group instruction that is supported by a PowerPoint presentation. Instructor discusses the definition of a risk factor, different risk factors for pulmonary disease, and how the heart and lungs work together. Flowsheet Row PULMONARY REHAB CHRONIC OBSTRUCTIVE PULMONARY DISEASE from 11/10/2023 in [facility] for Heart,  Vascular, & Lung Health  Date 11/10/23  Educator EP  Instruction Review Code 1- Verbalizes Understanding       Pulmonary Diseases Group instruction provided by PowerPoint, verbal discussion, and written material to support subject matter. Instructor gives an overview of the different type of pulmonary diseases. There is also a discussion on risk factors and symptoms as well as ways to manage the diseases. Flowsheet Row PULMONARY REHAB CHRONIC OBSTRUCTIVE PULMONARY DISEASE from 12/01/2023 in [facility] for Heart, Vascular, & Lung Health  Date 12/01/23  Educator RT  Instruction Review Code 1- Verbalizes Understanding       Stress and Energy Conservation Group instruction provided by PowerPoint, verbal discussion, and written material to support subject matter. Instructor gives an overview of stress and  the impact it can have on the body. Instructor also reviews ways to reduce stress. There is also a discussion on energy conservation and ways to conserve energy throughout the day.   Warning Signs and Symptoms Group instruction provided by PowerPoint, verbal discussion, and written material to support subject matter. Instructor reviews warning signs and symptoms of stroke, heart attack, cold and flu. Instructor also reviews ways to prevent the spread of infection.   Other Education Group or individual verbal, written, or video instructions that support the educational goals of the pulmonary rehab program.    Knowledge Questionnaire Score:  Knowledge Questionnaire Score - 01/10/24 1202       Knowledge Questionnaire Score   Post Score 18/18             Core Components/Risk Factors/Patient Goals at Admission:  Personal Goals and Risk Factors at Admission - 11/02/23 1052       Core Components/Risk Factors/Patient Goals on Admission    Weight Management Weight Loss;Yes    Improve shortness of breath with ADL's Yes    Intervention Provide education,  individualized exercise plan and daily activity instruction to help decrease symptoms of SOB with activities of daily living.    Expected Outcomes Short Term: Improve cardiorespiratory fitness to achieve a reduction of symptoms when performing ADLs;Long Term: Be able to perform more ADLs without symptoms or delay the onset of symptoms             Core Components/Risk Factors/Patient Goals Review:   Goals and Risk Factor Review     Row Name 11/16/23 0950 12/09/23 1412 01/13/24 0853         Core Components/Risk Factors/Patient Goals Review   Personal Goals Review Weight Management/Obesity;Improve shortness of breath with ADL's;Develop more efficient breathing techniques such as purse lipped breathing and diaphragmatic breathing and practicing self-pacing with activity. Weight Management/Obesity;Improve shortness of breath with ADL's Weight Management/Obesity;Improve shortness of breath with ADL's     Review Creeden has only attended 3 sessions so far. Goal progressing for weight loss. Camrynn is working with the staff dietitian to achieve his weight loss goals. Goal progressing on improving shortness of breath with ADL's. Goal progressing on developing more efficient breathing techniques such as purse lipped breathing and diaphragmatic breathing; and practicing self-pacing with activity. Goal progressing for weight loss. Goal progressing on improving shortness of breath with ADL's. Goal met on developing more efficient breathing techniques such as purse lipped breathing and diaphragmatic breathing; and practicing self-pacing with activity. Naftoli is able to demonstrate purse lip breathing when he gets SOB. He also knows how to self pace when walking the track. He is able to maintain sats >88% on RA while exercising. We will continue to monitor Lindy's progress throughout the program. Goal progressing for weight loss. Goal progressing on improving shortness of breath with ADL's. Delaine is able to  maintain sats >88% on RA while exercising. Makiyah has a good attitude in class and always gives his best effort. We will continue to monitor Dain's progress throughout the program.     Expected Outcomes To improve shortness of breath with ADL's. Develop more efficient breathing techniques such as purse lipped breathing and diaphragmatic breathing; and practicing self-pacing with activity and lose weight. To improve shortness of breath with ADL's and lose weight To improve shortness of breath with ADL's and lose weight              Core Components/Risk Factors/Patient Goals at Discharge (Final Review):   Goals and  Risk Factor Review - 01/13/24 0853       Core Components/Risk Factors/Patient Goals Review   Personal Goals Review Weight Management/Obesity;Improve shortness of breath with ADL's    Review Goal progressing for weight loss. Goal progressing on improving shortness of breath with ADL's. Hayk is able to maintain sats >88% on RA while exercising. Amair has a good attitude in class and always gives his best effort. We will continue to monitor Calob's progress throughout the program.    Expected Outcomes To improve shortness of breath with ADL's and lose weight             ITP Comments: Pt is making expected progress toward Pulmonary Rehab goals after completing 20 session(s). Recommend continued exercise, life style modification, education, and utilization of breathing techniques to increase stamina and strength, while also decreasing shortness of breath with exertion.  Dr. Mechele Collin is Medical Director for Pulmonary Rehab at [facility].

## 2024-01-19 ENCOUNTER — Encounter (HOSPITAL_COMMUNITY)
Admission: RE | Admit: 2024-01-19 | Discharge: 2024-01-19 | Disposition: A | Discharge status: Home / Self Care | Payer: No Typology Code available for payment source | Source: Ambulatory Visit | Attending: Pulmonary Disease | Admitting: Pulmonary Disease

## 2024-01-19 DIAGNOSIS — J449 Chronic obstructive pulmonary disease, unspecified: Secondary | ICD-10-CM

## 2024-01-19 NOTE — Progress Notes (Signed)
Daily Session Note  Patient Details  Name: Anthony [name] MRN: [id number] Date of Birth: [birth year] Referring Provider:   Doristine Devoid Pulmonary Rehab Walk Test from 11/02/2023 in [facility] for Heart, Vascular, & Lung [facility]  Referring Provider Briones       Encounter Date: 01/19/2024  Check In:  Session Check In - 01/19/24 1109       Check-In   Supervising physician immediately available to respond to emergencies [facility] MD immediately available    Physician(s) Robin Searing, NP    Location [facility]    Staff Present Essie Hart, RN, BSN;Casey Katrinka Blazing, Zella Richer, MS, ACSM-CEP, Exercise Physiologist;Bailey Wallace Cullens, MS, Exercise Physiologist;David Manus Gunning, MS, ACSM-CEP, CCRP, Exercise Physiologist    Virtual Visit No    Medication changes reported     No    Fall or balance concerns reported    No    Tobacco Cessation No Change    Warm-up and Cool-down Performed as group-led instruction    Resistance Training Performed Yes    VAD Patient? No    PAD/SET Patient? No      Pain Assessment   Currently in Pain? No/denies    Multiple Pain Sites No             Capillary Blood Glucose: No results found for this or any previous visit (from the past 24 hours).    Social History   Tobacco Use  Smoking Status Former   Current packs/day: 0.00   Average packs/day: 2.0 packs/day for 28.0 years (56.0 ttl pk-yrs)   Types: Cigarettes   Start date: 08/03/1959   Quit date: 08/03/1987   Years since quitting: 36.4  Smokeless Tobacco Never    Goals Met:  Exercise tolerated well No report of concerns or symptoms today Strength training completed today  Goals Unmet:  Not Applicable  Comments: Service time is from 1006 to 1143.    Dr. Mechele Collin is Medical Director for Pulmonary Rehab at [facility].

## 2024-01-24 ENCOUNTER — Encounter (HOSPITAL_COMMUNITY)
Admission: RE | Admit: 2024-01-24 | Discharge: 2024-01-24 | Disposition: A | Discharge status: Home / Self Care | Payer: No Typology Code available for payment source | Source: Ambulatory Visit | Attending: Pulmonary Disease | Admitting: Pulmonary Disease

## 2024-01-24 DIAGNOSIS — J449 Chronic obstructive pulmonary disease, unspecified: Secondary | ICD-10-CM

## 2024-01-24 NOTE — Progress Notes (Signed)
Daily Session Note  Patient Details  Name: Anthony [name] MRN: [id number] Date of Birth: 05/03/1946 Referring Provider:   Doristine Devoid Pulmonary Rehab Walk Test from 11/02/2023 in [facility], [facility], [facility]  Referring Provider Briones       Encounter Date: 01/24/2024  Check In:  Session Check In - 01/24/24 1450       Check-In   Supervising physician immediately available to respond to emergencies [facility] MD immediately available    Physician(s) Tereso Newcomer, PA    Location [facility]    Staff Present Essie Hart, RN, BSN;Casey Katrinka Blazing, Zella Richer, MS, ACSM-CEP, Exercise Physiologist;Olinty Peggye Pitt, MS, ACSM-CEP, Exercise Physiologist;David Manus Gunning, MS, ACSM-CEP, CCRP, Exercise Physiologist    Virtual Visit No    Medication changes reported     No    Fall or balance concerns reported    No    Tobacco Cessation No Change    Warm-up and Cool-down Performed as group-led instruction    Resistance Training Performed Yes    VAD Patient? No    PAD/SET Patient? No      Pain Assessment   Currently in Pain? No/denies    Pain Score 0-No pain    Multiple Pain Sites No             Capillary Blood Glucose: No results found for this or any previous visit (from the past 24 hours).    Social History   Tobacco Use  Smoking Status Former   Current packs/day: 0.00   Average packs/day: 2.0 packs/day for 28.0 years (56.0 ttl pk-yrs)   Types: Cigarettes   Start date: 08/03/1959   Quit date: 08/03/1987   Years since quitting: 36.5  Smokeless Tobacco Never    Goals Met:  Independence with exercise equipment Exercise tolerated well No report of concerns or symptoms today Strength training completed today  Goals Unmet:  O2 Sat, pt's O2 low today, had to be placed on 2L of oxygen  Comments: Service time is from 1316 to 1433    Dr. Mechele Collin is Medical Director for Pulmonary Rehab at [facility].

## 2024-01-26 ENCOUNTER — Encounter (HOSPITAL_COMMUNITY)
Admission: RE | Admit: 2024-01-26 | Discharge: 2024-01-26 | Disposition: A | Discharge status: Home / Self Care | Payer: No Typology Code available for payment source | Source: Ambulatory Visit | Attending: Pulmonary Disease | Admitting: Pulmonary Disease

## 2024-01-26 DIAGNOSIS — J449 Chronic obstructive pulmonary disease, unspecified: Secondary | ICD-10-CM

## 2024-01-26 NOTE — Progress Notes (Signed)
Daily Session Note  Patient Details  Name: Anthony [name] MRN: [id number] Date of Birth: 01/03/1946 Referring Provider:   Doristine Devoid Pulmonary Rehab Walk Test from 11/02/2023 in [facility] for [facility], V[facility], & [facility]  Referring Provider Briones       Encounter Date: 01/26/2024  Check In:  Session Check In - 01/26/24 1118       Check-In   Supervising physician immediately available to respond to emergencies [facility] MD immediately available    Physician(s) Edd Fabian, NP    Location [facility]    Staff Present Essie Hart, RN, BSN;Luda Charbonneau Katrinka Blazing, Zella Richer, MS, ACSM-CEP, Exercise Physiologist;Randi Dionisio Paschal, ACSM-CEP, Exercise Physiologist;Jetta Walker BS, ACSM-CEP, Exercise Physiologist    Virtual Visit No    Medication changes reported     No    Fall or balance concerns reported    No    Tobacco Cessation No Change    Warm-up and Cool-down Performed as group-led instruction    Resistance Training Performed Yes    VAD Patient? No    PAD/SET Patient? No      Pain Assessment   Currently in Pain? No/denies    Multiple Pain Sites No             Capillary Blood Glucose: No results found for this or any previous visit (from the past 24 hours).    Social History   Tobacco Use  Smoking Status Former   Current packs/day: 0.00   Average packs/day: 2.0 packs/day for 28.0 years (56.0 ttl pk-yrs)   Types: Cigarettes   Start date: 08/03/1959   Quit date: 08/03/1987   Years since quitting: 36.5  Smokeless Tobacco Never    Goals Met:  Proper associated with RPD/PD & O2 Sat Independence with exercise equipment Exercise tolerated well No report of concerns or symptoms today Strength training completed today  Goals Unmet:  Not Applicable  Comments: Service time is from 1006 to 1140.    Dr. Mechele Collin is Medical Director for Pulmonary Rehab at [facility].

## 2024-01-26 NOTE — Progress Notes (Signed)
Discharge Progress Report  Patient Details  Name: Anthony [name] MRN: [id number] Date of Birth: [birth year] Referring Provider:   Doristine Devoid Pulmonary Rehab Walk Test from 11/02/2023 in [facility] for [facility], Vascular, & Lung [facility]  Referring Provider Briones        Number of Visits: 74  Reason for Discharge:  Patient has met program and personal goals.  Smoking History:  Social History   Tobacco Use  Smoking Status Former   Current packs/day: 0.00   Average packs/day: 2.0 packs/day for 28.0 years (56.0 ttl pk-yrs)   Types: Cigarettes   Start date: 08/03/1959   Quit date: 08/03/1987   Years since quitting: 36.5  Smokeless Tobacco Never    Diagnosis:  Stage 2 moderate COPD by GOLD classification (HCC)  ADL UCSD:  Pulmonary Assessment Scores     Row Name 11/02/23 1216 01/10/24 1202       ADL UCSD   ADL Phase Entry Exit    SOB Score total 32 23      CAT Score   CAT Score 5 6      mMRC Score   mMRC Score 3 --             Initial Exercise Prescription:  Initial Exercise Prescription - 11/02/23 1100       Date of Initial Exercise RX and Referring Provider   Date 11/02/23    Referring Provider Briones    Expected Discharge Date 01/26/24      NuStep   Level 1    SPM 60    Minutes 15    METs 1.5      Track   Minutes 15    METs 1.5      Prescription Details   Frequency (times per week) 2    Duration Progress to 30 minutes of continuous aerobic without signs/symptoms of physical distress      Intensity   THRR 40-80% of Max Heartrate 56-113    Ratings of Perceived Exertion 11-13    Perceived Dyspnea 0-4      Progression   Progression Continue to progress workloads to maintain intensity without signs/symptoms of physical distress.      Resistance Training   Training Prescription Yes    Weight red bands    Reps 10-15             Discharge Exercise Prescription (Final Exercise Prescription Changes):  Exercise  Prescription Changes - 01/17/24 1200       Response to Exercise   Blood Pressure (Admit) 98/50    Blood Pressure (Exercise) 110/58    Blood Pressure (Exit) 94/48    Heart Rate (Admit) 73 bpm    Heart Rate (Exercise) 88 bpm    Heart Rate (Exit) 82 bpm    Oxygen Saturation (Admit) 92 %    Oxygen Saturation (Exercise) 88 %    Oxygen Saturation (Exit) 93 %    Rating of Perceived Exertion (Exercise) 12    Perceived Dyspnea (Exercise) 2    Duration Continue with 30 min of aerobic exercise without signs/symptoms of physical distress.    Intensity THRR unchanged      Progression   Progression Continue to progress workloads to maintain intensity without signs/symptoms of physical distress.      Resistance Training   Training Prescription Yes    Weight black bands    Reps 10-15    Time 10 Minutes      Treadmill   MPH 1.8  Grade 0    Minutes 15    METs 2.2      NuStep   Level 4    SPM 88    Minutes 15    METs 3.5             Functional Capacity:  6 Minute Walk     Row Name 11/02/23 1128 01/26/24 1454       6 Minute Walk   Phase Initial Discharge    Distance 925 feet 1015 feet    Distance % Change -- 10.33 %    Distance Feet Change -- 95 ft    Walk Time 6 minutes 6 minutes    # of Rest Breaks 3  2:09-2:36, 3:49-4:10, 5:30-6:00 2  2:10-2:27, 3:18-3:35    MPH 1.75 1.92    METS 1.25 1.55    RPE 15 12    Perceived Dyspnea  3 2    VO2 Peak 4.38 5.44    Symptoms No No    Resting HR 70 bpm 77 bpm    Resting BP 110/60 110/56    Resting Oxygen Saturation  94 % 91 %    Exercise Oxygen Saturation  during 6 min walk 92 % 86 %    Max Ex. HR 91 bpm 105 bpm    Max Ex. BP 124/56 120/58    2 Minute Post BP 112/52 122/60      Interval HR   1 Minute HR 80 105    2 Minute HR 79 95    3 Minute HR 90 91    4 Minute HR 88 87    5 Minute HR 86 91    6 Minute HR 84  91 @ 5:45 99    2 Minute Post HR 75 82    Interval Heart Rate? Yes Yes      Interval Oxygen   Interval  Oxygen? Yes Yes    Baseline Oxygen Saturation % 95 % 91 %    1 Minute Oxygen Saturation % 94 % 94 %    1 Minute Liters of Oxygen 0 L 0 L    2 Minute Oxygen Saturation % 92 % 89 %    2 Minute Liters of Oxygen 0 L 0 L    3 Minute Oxygen Saturation % 92 % 88 %    3 Minute Liters of Oxygen 0 L 0 L    4 Minute Oxygen Saturation % 93 % 94 %  3:35- 86%    4 Minute Liters of Oxygen 0 L 1 L  increased to 1L    5 Minute Oxygen Saturation % 92 % 94 %    5 Minute Liters of Oxygen 0 L 1 L    6 Minute Oxygen Saturation % 93 % 90 %    6 Minute Liters of Oxygen 0 L 1 L    2 Minute Post Oxygen Saturation % 97 % 94 %    2 Minute Post Liters of Oxygen 0 L 1 L             Psychological, QOL, Others - Outcomes: PHQ 2/9:    01/10/2024   12:01 PM 11/02/2023    1:39 PM  Depression screen PHQ 2/9  Decreased Interest 0 0  Down, Depressed, Hopeless 0 0  PHQ - 2 Score 0 0  Altered sleeping 0 0  Tired, decreased energy 0 0  Change in appetite 0 0  Feeling bad or failure about yourself  0 0  Trouble  concentrating 0 0  Moving slowly or fidgety/restless 0 0  Suicidal thoughts 0 0  PHQ-9 Score 0 0  Difficult doing work/chores Not difficult at all Not difficult at all    Quality of Life:   Personal Goals: Goals established at orientation with interventions provided to work toward goal.  Personal Goals and Risk Factors at Admission - 11/02/23 1052       Core Components/Risk Factors/Patient Goals on Admission    Weight Management Weight Loss;Yes    Improve shortness of breath with ADL's Yes    Intervention Provide education, individualized exercise plan and daily activity instruction to help decrease symptoms of SOB with activities of daily living.    Expected Outcomes Short Term: Improve cardiorespiratory fitness to achieve a reduction of symptoms when performing ADLs;Long Term: Be able to perform more ADLs without symptoms or delay the onset of symptoms              Personal Goals  Discharge:  Goals and Risk Factor Review     Row Name 11/16/23 0950 12/09/23 1412 01/13/24 0853 01/26/24 1516       Core Components/Risk Factors/Patient Goals Review   Personal Goals Review Weight Management/Obesity;Improve shortness of breath with ADL's;Develop more efficient breathing techniques such as purse lipped breathing and diaphragmatic breathing and practicing self-pacing with activity. Weight Management/Obesity;Improve shortness of breath with ADL's Weight Management/Obesity;Improve shortness of breath with ADL's --    Review Sarthak has only attended 3 sessions so far. Goal progressing for weight loss. Rashawd is working with the staff dietitian to achieve his weight loss goals. Goal progressing on improving shortness of breath with ADL's. Goal progressing on developing more efficient breathing techniques such as purse lipped breathing and diaphragmatic breathing; and practicing self-pacing with activity. Goal progressing for weight loss. Goal progressing on improving shortness of breath with ADL's. Goal met on developing more efficient breathing techniques such as purse lipped breathing and diaphragmatic breathing; and practicing self-pacing with activity. Nickalos is able to demonstrate purse lip breathing when he gets SOB. He also knows how to self pace when walking the track. He is able to maintain sats >88% on RA while exercising. We will continue to monitor Ardith's progress throughout the program. Goal progressing for weight loss. Goal progressing on improving shortness of breath with ADL's. Ozan is able to maintain sats >88% on RA while exercising. Creedon has a good attitude in class and always gives his best effort. We will continue to monitor Tomoki's progress throughout the program. Goal met for weight loss. Kimarion is down 5#'s since the start of the program. Goal met for improving shortness of breath with ADL's. His SOB score decreased from 32 to 23. Tayshawn unfortunately did need 1L of  oxygen on his post walk test to maintain sats >87%.  Mareon did very well in the program and was a pleasure to work with.    Expected Outcomes To improve shortness of breath with ADL's. Develop more efficient breathing techniques such as purse lipped breathing and diaphragmatic breathing; and practicing self-pacing with activity and lose weight. To improve shortness of breath with ADL's and lose weight To improve shortness of breath with ADL's and lose weight --             Exercise Goals and Review:  Exercise Goals     Row Name 11/02/23 1048             Exercise Goals   Increase Physical Activity Yes  Intervention Provide advice, education, support and counseling about physical activity/exercise needs.;Develop an individualized exercise prescription for aerobic and resistive training based on initial evaluation findings, risk stratification, comorbidities and participant's personal goals.       Expected Outcomes Short Term: Attend rehab on a regular basis to increase amount of physical activity.;Long Term: Exercising regularly at least 3-5 days a week.;Long Term: Add in home exercise to make exercise part of routine and to increase amount of physical activity.       Increase Strength and Stamina Yes       Intervention Provide advice, education, support and counseling about physical activity/exercise needs.;Develop an individualized exercise prescription for aerobic and resistive training based on initial evaluation findings, risk stratification, comorbidities and participant's personal goals.       Expected Outcomes Short Term: Increase workloads from initial exercise prescription for resistance, speed, and METs.;Short Term: Perform resistance training exercises routinely during rehab and add in resistance training at home;Long Term: Improve cardiorespiratory fitness, muscular endurance and strength as measured by increased METs and functional capacity ( )       Able to understand  and use rate of perceived exertion (RPE) scale Yes       Intervention Provide education and explanation on how to use RPE scale       Expected Outcomes Short Term: Able to use RPE daily in rehab to express subjective intensity level;Long Term:  Able to use RPE to guide intensity level when exercising independently       Able to understand and use Dyspnea scale Yes       Intervention Provide education and explanation on how to use Dyspnea scale       Expected Outcomes Short Term: Able to use Dyspnea scale daily in rehab to express subjective sense of shortness of breath during exertion;Long Term: Able to use Dyspnea scale to guide intensity level when exercising independently       Knowledge and understanding of Target Heart Rate Range (THRR) Yes       Intervention Provide education and explanation of THRR including how the numbers were predicted and where they are located for reference       Expected Outcomes Short Term: Able to state/look up THRR;Short Term: Able to use daily as guideline for intensity in rehab;Long Term: Able to use THRR to govern intensity when exercising independently       Understanding of Exercise Prescription Yes       Intervention Provide education, explanation, and written materials on patient's individual exercise prescription       Expected Outcomes Short Term: Able to explain program exercise prescription;Long Term: Able to explain home exercise prescription to exercise independently                Exercise Goals Re-Evaluation:  Exercise Goals Re-Evaluation     Row Name 11/16/23 1141 12/12/23 0945 01/12/24 0754 01/26/24 1501       Exercise Goal Re-Evaluation   Exercise Goals Review Increase Physical Activity;Able to understand and use Dyspnea scale;Understanding of Exercise Prescription;Increase Strength and Stamina;Knowledge and understanding of Target Heart Rate Range (THRR);Able to understand and use rate of perceived exertion (RPE) scale Increase Physical  Activity;Able to understand and use Dyspnea scale;Understanding of Exercise Prescription;Increase Strength and Stamina;Knowledge and understanding of Target Heart Rate Range (THRR);Able to understand and use rate of perceived exertion (RPE) scale Increase Physical Activity;Able to understand and use Dyspnea scale;Understanding of Exercise Prescription;Increase Strength and Stamina;Knowledge and understanding of Target Heart Rate Range (THRR);Able  to understand and use rate of perceived exertion (RPE) scale Increase Physical Activity;Able to understand and use Dyspnea scale;Understanding of Exercise Prescription;Increase Strength and Stamina;Knowledge and understanding of Target Heart Rate Range (THRR);Able to understand and use rate of perceived exertion (RPE) scale    Comments Derwin has completed 3 exercise sessions. He exercises for 15 min on the track and Nustep. He averages 2.23 METs on the track and 2.5 METs at level 2 on the Nustep. He performs the warmup and cooldown standing without limitations. It is too soon to notate any discernable progressions. Will continue to monitor and progess as able. Junah has completed 9 exercise sessions. He exercises for 15 min on the track and Nustep. He averages 2.23 METs on the track and 2.7 METs at level 3 on the Nustep. He performs the warmup and cooldown standing without limitations. Ji has increased his level for the Nustep as METs have remained relatively the same. Will encourage Daishawn to increase track laps and level on Nustep. We have recently discussed home exercise as Merick is not exercising at home. I encouraged him to use a fitness center to exercise. Will continue to monitor and progess as able. Isrrael has completed 18 exercise sessions. He exercises for 15 min on the track and Nustep. He averages 1.9 METs at 1.4 mph on the treadmill and 2.6 METs at level 4 on the Nustep. He performs the warmup and cooldown standing without limitations. Yishai has  increased his level for the Nustep as METs have remained relatively the same. He tolerates the increased level well. Rae has also progressed from track walking to treadmill walking. We went over home exercise again as Treshawn mentioned exercising at the gym a couple of times. I am confident in [place] exercising outside of rehab. Will continue to monitor and progress as able. Arren has completed 23 exercise sessions. Peak METs were 3.6 on the Nustep and 2.3 on the treadmill. Arshad plans to continue exercise at the gym within his living facility.    Expected Outcomes Through exercise at rehab and home, the patient will decrease shortness of breath with daily activities and feel confident in carrying out an exercise regimen at home. Through exercise at rehab and home, the patient will decrease shortness of breath with daily activities and feel confident in carrying out an exercise regimen at home. Through exercise at rehab and home, the patient will decrease shortness of breath with daily activities and feel confident in carrying out an exercise regimen at home. Through exercise at rehab and home, the patient will decrease shortness of breath with daily activities and feel confident in carrying out an exercise regimen at home.             Nutrition & Weight - Outcomes:    Nutrition:  Nutrition Therapy & Goals - 11/08/23 1127       Nutrition Therapy   Diet Heart Healthy/Carbohydrate Consistent Diet    Drug/Food Interactions Statins/Certain Fruits      Personal Nutrition Goals   Nutrition Goal Patient to improve diet quality by using the plate method as a guide for meal planning to include lean protein/plant protein, fruits, vegetables, whole grains, nonfat dairy as part of a well-balanced diet.    Comments Plumer has medical history of COPD, CAD, PAD, HTN, DM2, CKD3. His A1c remains above goal of <7% (7.7); he continues ozempic, metformin, semglee. He reports working on weight loss through  diet and exercise. His spouse is supportive. Patient will benefit from participation  in pulmonary rehab for nutrition, exercise, and lifestyle modification.      Intervention Plan   Intervention Prescribe, educate and counsel regarding individualized specific dietary modifications aiming towards targeted core components such as weight, hypertension, lipid management, diabetes, heart failure and other comorbidities.;Nutrition handout(s) given to patient.    Expected Outcomes Short Term Goal: Understand basic principles of dietary content, such as calories, fat, sodium, cholesterol and nutrients.;Long Term Goal: Adherence to prescribed nutrition plan.             Nutrition Discharge:  Nutrition Assessments - 01/10/24 1200       Rate Your Plate Scores   Post Score 48             Education Questionnaire Score:  Knowledge Questionnaire Score - 01/10/24 1202       Knowledge Questionnaire Score   Post Score 18/18             Goals reviewed with patient; copy given to patient.

## 2024-01-27 NOTE — Progress Notes (Signed)
   01/26/24 1454  6 Minute Walk  Phase Discharge  Distance 1015 feet  Distance % Change 10.33 %  Distance Feet Change 95 ft  Walk Time 6 minutes  # of Rest Breaks 2 (2:10-2:27, 3:18-3:35)  MPH 1.92  METS 1.55  RPE 12  Perceived Dyspnea  2  VO2 Peak 5.44  Symptoms No  Resting HR 77 bpm  Resting BP 110/56  Resting Oxygen Saturation  91 %  Exercise Oxygen Saturation  during 6 min walk 86 %  Max Ex. HR 105 bpm  Max Ex. BP 120/58  2 Minute Post BP 122/60  Interval HR  1 Minute HR 105  2 Minute HR 95  3 Minute HR 91  4 Minute HR 87  5 Minute HR 91  6 Minute HR 99  2 Minute Post HR 82  Interval Heart Rate? Yes  Interval Oxygen  Interval Oxygen? Yes  Baseline Oxygen Saturation % 91 %  1 Minute Oxygen Saturation % 94 %  1 Minute Liters of Oxygen 0 L  2 Minute Oxygen Saturation % 89 %  2 Minute Liters of Oxygen 0 L  3 Minute Oxygen Saturation % 88 %  3 Minute Liters of Oxygen 0 L  4 Minute Oxygen Saturation % 94 % (86% @ 3:35)  4 Minute Liters of Oxygen 1 L (increased to 1L)  5 Minute Oxygen Saturation % 94 %  5 Minute Liters of Oxygen 1 L  6 Minute Oxygen Saturation % 90 %  6 Minute Liters of Oxygen 1 L  2 Minute Post Oxygen Saturation % 94 %  2 Minute Post Liters of Oxygen 1 L

## 2024-02-01 ENCOUNTER — Ambulatory Visit (HOSPITAL_COMMUNITY)
Admission: RE | Admit: 2024-02-01 | Discharge: 2024-02-01 | Disposition: A | Discharge status: Home / Self Care | Payer: No Typology Code available for payment source | Source: Ambulatory Visit | Attending: Internal Medicine | Admitting: Internal Medicine

## 2024-02-01 ENCOUNTER — Encounter (HOSPITAL_COMMUNITY): Payer: Self-pay | Admitting: Internal Medicine

## 2024-02-01 VITALS — BP 120/70 | HR 74 | Wt 217.2 lb

## 2024-02-01 DIAGNOSIS — Z7902 Long term (current) use of antithrombotics/antiplatelets: Secondary | ICD-10-CM | POA: Insufficient documentation

## 2024-02-01 DIAGNOSIS — E785 Hyperlipidemia, unspecified: Secondary | ICD-10-CM | POA: Insufficient documentation

## 2024-02-01 DIAGNOSIS — I130 Hypertensive heart and chronic kidney disease with heart failure and stage 1 through stage 4 chronic kidney disease, or unspecified chronic kidney disease: Secondary | ICD-10-CM | POA: Insufficient documentation

## 2024-02-01 DIAGNOSIS — Z7985 Long-term (current) use of injectable non-insulin antidiabetic drugs: Secondary | ICD-10-CM | POA: Diagnosis not present

## 2024-02-01 DIAGNOSIS — I739 Peripheral vascular disease, unspecified: Secondary | ICD-10-CM

## 2024-02-01 DIAGNOSIS — G4733 Obstructive sleep apnea (adult) (pediatric): Secondary | ICD-10-CM | POA: Diagnosis not present

## 2024-02-01 DIAGNOSIS — N183 Chronic kidney disease, stage 3 unspecified: Secondary | ICD-10-CM | POA: Diagnosis not present

## 2024-02-01 DIAGNOSIS — J449 Chronic obstructive pulmonary disease, unspecified: Secondary | ICD-10-CM | POA: Insufficient documentation

## 2024-02-01 DIAGNOSIS — Z833 Family history of diabetes mellitus: Secondary | ICD-10-CM | POA: Insufficient documentation

## 2024-02-01 DIAGNOSIS — Z955 Presence of coronary angioplasty implant and graft: Secondary | ICD-10-CM | POA: Diagnosis not present

## 2024-02-01 DIAGNOSIS — Z7984 Long term (current) use of oral hypoglycemic drugs: Secondary | ICD-10-CM | POA: Insufficient documentation

## 2024-02-01 DIAGNOSIS — E669 Obesity, unspecified: Secondary | ICD-10-CM | POA: Diagnosis not present

## 2024-02-01 DIAGNOSIS — Z79899 Other long term (current) drug therapy: Secondary | ICD-10-CM | POA: Insufficient documentation

## 2024-02-01 DIAGNOSIS — N1831 Chronic kidney disease, stage 3a: Secondary | ICD-10-CM | POA: Diagnosis not present

## 2024-02-01 DIAGNOSIS — Z794 Long term (current) use of insulin: Secondary | ICD-10-CM | POA: Insufficient documentation

## 2024-02-01 DIAGNOSIS — E1151 Type 2 diabetes mellitus with diabetic peripheral angiopathy without gangrene: Secondary | ICD-10-CM | POA: Diagnosis not present

## 2024-02-01 DIAGNOSIS — Z87891 Personal history of nicotine dependence: Secondary | ICD-10-CM | POA: Insufficient documentation

## 2024-02-01 DIAGNOSIS — I2510 Atherosclerotic heart disease of native coronary artery without angina pectoris: Secondary | ICD-10-CM | POA: Diagnosis not present

## 2024-02-01 DIAGNOSIS — E1122 Type 2 diabetes mellitus with diabetic chronic kidney disease: Secondary | ICD-10-CM | POA: Insufficient documentation

## 2024-02-01 DIAGNOSIS — R9431 Abnormal electrocardiogram [ECG] [EKG]: Secondary | ICD-10-CM | POA: Insufficient documentation

## 2024-02-01 DIAGNOSIS — I5032 Chronic diastolic (congestive) heart failure: Secondary | ICD-10-CM | POA: Insufficient documentation

## 2024-02-01 DIAGNOSIS — Z8616 Personal history of COVID-19: Secondary | ICD-10-CM | POA: Insufficient documentation

## 2024-02-01 DIAGNOSIS — Z8349 Family history of other endocrine, nutritional and metabolic diseases: Secondary | ICD-10-CM | POA: Diagnosis not present

## 2024-02-01 DIAGNOSIS — Z8249 Family history of ischemic heart disease and other diseases of the circulatory system: Secondary | ICD-10-CM | POA: Diagnosis not present

## 2024-02-01 NOTE — Progress Notes (Signed)
 Acute Heart Failure Clinic Note    Date:  02/01/2024   ID:  Anthony [name], DOB March 11, 1946, MRN [id number]  Location: Home  Provider location: [facility] Type of Visit: Established patient  PCP:  Onita Rush, MD  Cardiologist:  None Primary HF: Keysi [place]  Chief Complaint: Heart Failure follow-up   History of Present Illness:  Anthony [name] is a 78 y.o. male with h/o obesity, HTN, HL, severe osteoarthritis, DM2 (diagnosed 2009), OSA, former smoker (quit 1989), CAD s/p stent 2001 and PAD. He is the father-in-law of Dr. Juliene Balder in Interventional Radiology.    Had cath in [place]  in 2001. Was told he had 70% blockage in one artery and the one in the back was totally blocked. Eventually underwent stenting.    Has severe PAD R>L followed by Dr. Serene. Now status post right external iliac, common femoral, superficial femoral endarterectomy with bovine pericardial patch angioplasty on 10/10/2014  Saw Dr. Shellia and placed on home O2 but now weaned off with inhalers. Checks sats with pulse oximeter    Echo 8/20 EF 50% RV ok. Personally reviewed   Was having more SOB and some CP. Underwent repeat cath 05/11/19  1. Left dominant coronary system with 2V CAD 2. Small dominant RCA with chronic proximal total occlusion 3. Widely patent dominant LCX 4. High grade 95% proximal LAD likely within or just prior to previous stent. Probable chronic occlusion of distal LAD but not seen well -> PCI/DES to prox LAD and POBA of D1 5. LVEF 60-65% with normal filling pressures 6. Normal right heart pressures   11/21 Had extensive endarterectomy of left external iliac, CFA and SFA with patch angioplasty and stenting.   Sleep study with severe OS - AHI 39.    PFT 11/28/19 >> FEV1 2.30 (78%), FEV1% 75, TLC 5.84 (85%), DLCO 70%, no BD  Here for routine f/u. On October 7 had left temporal CVA. Treated medically. No significant residual. Subsequently went to Pulmonary  Rehab. Seeing Dr. Tor at [facility]. CT c/w with emphysema. Recently placed on abx for significant sinus disease. Remains SOB with very mild activity. No CP. On lasix  20 daily. Not taking extra.  No edema, orthopnea or PND. Still with bilateral claudication.     Past Medical History:  Diagnosis Date   Atherosclerosis of native artery of both lower extremities with intermittent claudication (HCC)    followed by vascular , dr serene;  s/p angioplasty and stenting bilaterally 2015 and 11/ 2021 (per lov note 06-08-2021 bilateral iliofemoral intervention's are widely patent)   CAD (coronary artery disease)    cardiologist-- dr cherrie;  s/p stent placed 06/02/2000 in [place] ;  s/p cath with patent LCx , PCI w/ DES to pLAD and PCI to POBA of D1   Chronic diastolic (congestive) heart failure (HCC)    followed by cardiology   COPD (chronic obstructive pulmonary disease) with chronic bronchitis [facility])    pulmonology-- dr shellia--  (09-23-2021  per pt checks O2 stats at home, avereage 96-97% on RA)   Edema of left lower extremity    per pt since vasuclar surgery 11/ 2021, but told mild   History of COVID-19 06/2021   per pt mild symtpoms that resolved   Hyperlipidemia    Hypertension    followed by cardiology and pcp   OA (osteoarthritis)    OSA on CPAP    followed by dr shellia--  study in epic 08-23-2012 severe osa (  no additional oxygen)   PAD (peripheral artery disease) (HCC)    Peripheral neuropathy    feet   Peripheral vascular disease (HCC)    Rosacea    S/P drug eluting coronary stent placement    2001  x1 stent (unsure if DES);  05/ 2020 DES to pLAD   Shortness of breath    with exertion with stairs but recovers quickly,  ok w/ household chores   Type 2 diabetes mellitus (HCC)    followed by pcp   (09-23-2021 per pt check blood sugar at home twice weekly in am,  fasting sugar-- 170)   Past Surgical History:  Procedure Laterality Date   ABDOMINAL AORTAGRAM N/A  11/29/2012   Procedure: ABDOMINAL AORTAGRAM;  Surgeon: Gaile LELON New, MD;  Location: [facility] CATH LAB;  Service: Cardiovascular;  Laterality: N/A;   ABDOMINAL AORTAGRAM N/A 09/03/2014   Procedure: ABDOMINAL EZELLA;  Surgeon: Gaile LELON New, MD;  Location: [facility] CATH LAB;  Service: Cardiovascular;  Laterality: N/A;   ABDOMINAL AORTOGRAM W/LOWER EXTREMITY N/A 09/16/2020   Procedure: ABDOMINAL AORTOGRAM W/LOWER EXTREMITY;  Surgeon: New Gaile LELON, MD;  Location: [facility] INVASIVE CV LAB;  Service: Cardiovascular;  Laterality: N/A;   ANTERIOR CERVICAL DECOMP/DISCECTOMY FUSION  10/2001   C5--C7   ANTERIOR LATERAL LUMBAR FUSION 4 LEVELS Right 05/28/2014   Procedure: Right L4-5 L3-4 L2-3, L1-2  Anterior lateral lumbar fusion with percutaneaous pedicle screws. Lumbar four/five, three/four, two/three and possible two/one;  Surgeon: Fairy Levels, MD;  Location: [facility] NEURO ORS;  Service: Neurosurgery;  Laterality: Right;  Lumbar One-Five Fusion with Percutaneous Screws   BUNIONECTOMY Right    1991 and 1994   CARDIAC CATHETERIZATION  03/26/1999   in [place] ;  per pt medically managed , no intervention   CARPAL TUNNEL RELEASE Right 09/30/2006   CATARACT EXTRACTION W/ INTRAOCULAR LENS IMPLANT Bilateral 2014   COLONOSCOPY  08/26/2020   CORONARY ANGIOPLASTY WITH STENT PLACEMENT  06/02/2000   in [place] ; x1 stent   CORONARY STENT INTERVENTION N/A 05/11/2019   Procedure: CORONARY STENT INTERVENTION;  Surgeon: Verlin Lonni BIRCH, MD;  Location: [facility] INVASIVE CV LAB;  Service: Cardiovascular;  Laterality: N/A;   ENDARTERECTOMY FEMORAL Right 10/10/2014   Procedure: RIGHT FEMORAL ARTERY ENDARTERECTOMY  WITH VASCU GUARD PATCH ANGIOPLASTY;  Surgeon: Gaile LELON New, MD;  Location: [facility] OR;  Service: Vascular;  Laterality: Right;   ENDARTERECTOMY FEMORAL Left 11/05/2020   Procedure: LEFT FEMORAL ENDARTERECTOMY WITH PATCH ANGIOPLASTY;  Surgeon: New Gaile LELON, MD;  Location: [facility] OR;  Service: Vascular;  Laterality: Left;    FEMORAL ENDARTERECTOMY Left 11/05/2020   LEFT FEMORAL ENDARTERECTOMY WITH PATCH ANGIOPLASTY (Left    FINGER ARTHRODESIS Right 09/2009   right thumb   HAMMER TOE SURGERY Left    06/ 2007 and 08/ 2008   HYDRADENITIS EXCISION N/A 09/25/2021   Procedure: EXCISION HIDRADENITIS, INTERROGATION OF PERINEAL WOUND;  Surgeon: Teresa Lonni HERO, MD;  Location: [facility];  Service: General;  Laterality: N/A;   INSERTION OF ILIAC STENT Left 11/05/2020    INSERTION OF ILIAC STENT (Left )   INSERTION OF ILIAC STENT Left 11/05/2020   Procedure: INSERTION OF ILIAC STENT;  Surgeon: New Gaile LELON, MD;  Location: [facility] OR;  Service: Vascular;  Laterality: Left;   LUMBAR PERCUTANEOUS PEDICLE SCREW 4 LEVEL N/A 05/28/2014   Procedure: LUMBAR PERCUTANEOUS PEDICLE SCREW 4 LEVEL;  Surgeon: Fairy Levels, MD;  Location: [facility] NEURO ORS;  Service: Neurosurgery;  Laterality: N/A;   LUMBAR SPINE SURGERY  12/ 1997 and 04/ 2005   NEUROMA SURGERY Right 1996   right foot   PERIPHERAL INTRAVASCULAR LITHOTRIPSY Left 11/05/2020   Procedure: INTRAVASCULAR LITHOTRIPSY;  Surgeon: Serene Gaile ORN, MD;  Location: [facility] OR;  Service: Vascular;  Laterality: Left;  shockwave   PERIPHERAL VASCULAR CATHETERIZATION N/A 12/07/2016   Procedure: Abdominal Aortogram w/Lower Extremity;  Surgeon: Gaile ORN Serene, MD;  Location: [facility] INVASIVE CV LAB;  Service: Cardiovascular;  Laterality: N/A;   RECTAL EXAM UNDER ANESTHESIA N/A 09/25/2021   Procedure: ANORECTAL EXAM UNDER ANESTHESIA;  Surgeon: Teresa Lonni HERO, MD;  Location: [facility];  Service: General;  Laterality: N/A;   RIGHT/LEFT HEART CATH AND CORONARY ANGIOGRAPHY N/A 05/11/2019   Procedure: RIGHT/LEFT HEART CATH AND CORONARY ANGIOGRAPHY;  Surgeon: Cherrie Toribio SAUNDERS, MD;  Location: [facility] INVASIVE CV LAB;  Service: Cardiovascular;  Laterality: N/A;   TONSILLECTOMY  1965     Current Outpatient Medications  Medication Sig Dispense Refill   albuterol   (VENTOLIN  HFA) 108 (90 Base) MCG/ACT inhaler Inhale 2 puffs into the lungs every 6 (six) hours as needed for wheezing or shortness of breath.     amoxicillin (AMOXIL) 875 MG tablet Take 875 mg by mouth 2 (two) times daily. X 10 days     b complex vitamins capsule Take 1 capsule by mouth daily.     BENFOTIAMINE PO Take 1 tablet by mouth 2 (two) times daily.     bisoprolol  (ZEBETA ) 5 MG tablet Take 1 tablet by mouth every evening.     carboxymethylcellulose 1 % ophthalmic solution Apply 1 drop to eye 3 (three) times daily.     CINNAMON PO Take 1 tablet by mouth daily.     clopidogrel  (PLAVIX ) 75 MG tablet Take 75 mg by mouth daily.     doxazosin  (CARDURA ) 2 MG tablet Take 2 mg by mouth every evening.     doxycycline  (ADOXA) 50 MG tablet Take 50 mg by mouth daily at 12 noon.     empagliflozin  (JARDIANCE ) 25 MG TABS tablet Take 12.5 mg by mouth daily.     fluticasone  (FLONASE ) 50 MCG/ACT nasal spray Place 2 sprays into both nostrils at bedtime as needed for allergies or rhinitis.      furosemide  (LASIX ) 20 MG tablet Take 20 mg by mouth daily at 12 noon.      gabapentin (NEURONTIN) 100 MG capsule Take 200 mg by mouth every evening.     GARLIC PO Take 1 tablet by mouth daily.     hydrocortisone  2.5 % ointment Apply 1 Application topically daily as needed (skin irritation on face).     insulin  glargine-yfgn (SEMGLEE, YFGN,) 100 UNIT/ML Pen Inject 15 Units into the skin every evening.     Insulin  Pen Needle (PEN NEEDLES) 32G X 4 MM MISC Inject q daily with Rx of Lantus DxE-11.22 for 90 days     isosorbide  mononitrate (IMDUR ) 30 MG 24 hr tablet TAKE 1 TABLET BY MOUTH  DAILY 90 tablet 3   L-LYSINE  PO Take 1 tablet by mouth daily.     loratadine (CLARITIN) 10 MG tablet Take 10 mg by mouth daily.     losartan  (COZAAR ) 100 MG tablet Take 100 mg by mouth daily.     metFORMIN  (GLUCOPHAGE -XR) 500 MG 24 hr tablet Take 1,000 mg by mouth 2 (two) times daily.     Mometasone  Furoate 200 MCG/ACT AERO Take 200 mcg  by mouth daily.     Multiple Vitamin (MULTIVITAMIN) tablet Take 1 tablet by mouth  daily.     nitroGLYCERIN  (NITROSTAT ) 0.4 MG SL tablet Place 1 tablet (0.4 mg total) under the tongue every 5 (five) minutes as needed for chest pain. 90 tablet 3   Omega-3 Fatty Acids (FISH OIL PO) Take 1 capsule by mouth 2 (two) times daily.     pentoxifylline  (TRENTAL ) 400 MG CR tablet TAKE 1 TABLET BY MOUTH  TWICE DAILY 180 tablet 3   predniSONE (DELTASONE) 10 MG tablet Take 10 mg by mouth daily with breakfast.     Probiotic Product (PROBIOTIC PO) Take 1 capsule by mouth every evening.     rOPINIRole  (REQUIP ) 1 MG tablet Take 1 mg by mouth every evening.      Semaglutide, 2 MG/DOSE, (OZEMPIC, 2 MG/DOSE,) 8 MG/3ML SOPN Inject 2 mg into the skin See admin instructions. Inject 2 mg once a week on Thursdays.     simvastatin  (ZOCOR ) 40 MG tablet Take 40 mg by mouth every evening.     triamcinolone ointment (KENALOG) 0.1 % Apply 1 Application topically 2 (two) times daily.     umeclidinium-vilanterol (ANORO ELLIPTA ) 62.5-25 MCG/INH AEPB INHALE 1 INHALATION BY  MOUTH INTO THE LUNGS DAILY 180 each 3   No current facility-administered medications for this encounter.    Allergies:   Beef-derived drug products, Pork-derived products, and Spiriva  respimat [tiotropium bromide  monohydrate]   Social History:  The patient  reports that he quit smoking about 36 years ago. His smoking use included cigarettes. He started smoking about 64 years ago. He has a 56 pack-year smoking history. He has never used smokeless tobacco. He reports current alcohol  use. He reports that he does not use drugs.   Family History:  The patient's family history includes Cancer in his father and mother; Deep vein thrombosis in his mother; Diabetes in his mother; Heart attack in his father; Heart disease (age of onset: 47) in his father; Hyperlipidemia in his father and mother; Hypertension in his father and mother.   ROS:  Please see the history of  present illness.   All other systems are personally reviewed and negative.   Exam:  General:  Sitting up on table. No resp difficulty HEENT: normal Neck: supple. no JVD. Carotids 2+ bilat; no bruits. No lymphadenopathy or thryomegaly appreciated. Cor: PMI nondisplaced. Regular rate & rhythm. No rubs, gallops or murmurs. Lungs: clear Abdomen: soft, nontender, nondistended. No hepatosplenomegaly. No bruits or masses. Good bowel sounds. Extremities: no cyanosis, clubbing, rash, edema Neuro: alert & orientedx3, cranial nerves grossly intact. moves all 4 extremities w/o difficulty. Affect pleasant    ECG: NSR 74 anterolateral Qs Personally reviewed    Recent Labs: 10/04/2023: ALT 15 10/06/2023: BUN 29; Creatinine, Ser 1.22; Hemoglobin 14.5; Magnesium  2.3; Platelets 140; Potassium 4.3; Sodium 138  Personally reviewed   Wt Readings from Last 3 Encounters:  02/01/24 98.5 kg (217 lb 3.2 oz)  01/17/24 100.7 kg (222 lb 0.1 oz)  01/03/24 102 kg (224 lb 13.9 oz)      ASSESSMENT AND PLAN:    1) Chronic Diastolic HF-   - EF 45-50% with mild RV dysfunction on ECHO 5/16. - Echo 8/20 EF 50% RV ok. - Echo 10/24 EF 55-60% RV ok  - Stable NYHA III - multifactorial - Volume status ok  - Continue lasix  20 mg daily - Continue Jardiance   2) CAD - Last cath 05/11/19 with PCI LAD with DES and POBA of D1 - No s/s angina - Continue Plavix /statin/Jardiance  - Lipids followed by Dr. Onita. Goal LDL < 70  3) PAD - s/p R femoral artery endarterectomy in 2015. - 11/21 Had extensive endarterectomy of left external iliac, CFA and SFA with patch angioplasty and stenting.  - stable claudication. Followed by Dr. Serene   4) HTN - Blood pressure well controlled. Continue current regimen.  5) OSA on CPAP -Complaint with CPAP   6) Hyperlipidemia - Followed by Dr. Onita. Continue statin. Goal LDL < 70 - Last lipids 10/24. LDL 51   7) COPD - Follows with Dr. Tor at [facility]  8). CKD  3a - Scr 1.3-1.4 - Continue Jardiance  - recent labs reviewed with Scr 1.22    Signed, Toribio Fuel, MD  02/01/2024 9:58 AM  [facility] [address] [facility] [place] [address] [phone] (office) [phone] (fax)

## 2024-02-01 NOTE — Patient Instructions (Signed)
 Great to see you today!!!  Your physician recommends that you schedule a follow-up appointment in: 6 months (August), *PLEASE CALL OUR OFFICE IN JUNE TO SCHEDULE THIS APPOINTMENT

## 2024-04-02 ENCOUNTER — Ambulatory Visit (INDEPENDENT_AMBULATORY_CARE_PROVIDER_SITE_OTHER): Payer: Medicare Other | Admitting: Physician Assistant

## 2024-04-02 ENCOUNTER — Ambulatory Visit (HOSPITAL_COMMUNITY)
Admission: RE | Admit: 2024-04-02 | Discharge: 2024-04-02 | Disposition: A | Discharge status: Home / Self Care | Payer: Medicare Other | Source: Ambulatory Visit | Attending: Surgery | Admitting: Surgery

## 2024-04-02 VITALS — BP 124/67 | HR 61 | Temp 97.7°F | Ht 68.0 in | Wt 225.1 lb

## 2024-04-02 DIAGNOSIS — I6521 Occlusion and stenosis of right carotid artery: Secondary | ICD-10-CM

## 2024-04-02 NOTE — Progress Notes (Signed)
 Office Note   History of Present Illness   Anthony [name] is a 78 y.o. (1946-06-06) male who presents for surveillance of carotid artery stenosis.  His last duplex in October 2024 demonstrated right ICA 60 to 79% stenosis and left ICA 1 to 39% stenosis.  We also follow the patient for PAD with a history of lifestyle limiting claudication.  He has a history of left iliofemoral endarterectomy with lithotripsy and stenting of the left iliac artery on 11/05/2020.  He also has a remote history of extensive right common femoral/iliac artery endarterectomy on 10/10/2014.  The patient returns today for follow up.  He states he is doing well at today's visit.  He was recently hospitalized on 10/04/2023 after having a stroke.  He experienced dizziness and slurred speech, which lasted for approximately 5 hours.  MRI demonstrated an acute left thalamic infarct, due to intracranial large vessel disease.  He was started on aspirin and Plavix for 3 months.  He now just takes daily Plavix.  He denies any recent strokelike symptoms since his hospitalization.  He also denies any rest pain or tissue loss.  Current Outpatient Medications  Medication Sig Dispense Refill   albuterol (VENTOLIN HFA) 108 (90 Base) MCG/ACT inhaler Inhale 2 puffs into the lungs every 6 (six) hours as needed for wheezing or shortness of breath.     amoxicillin (AMOXIL) 875 MG tablet Take 875 mg by mouth 2 (two) times daily. X 10 days     b complex vitamins capsule Take 1 capsule by mouth daily.     BENFOTIAMINE PO Take 1 tablet by mouth 2 (two) times daily.     bisoprolol (ZEBETA) 5 MG tablet Take 1 tablet by mouth every evening.     carboxymethylcellulose 1 % ophthalmic solution Apply 1 drop to eye 3 (three) times daily.     CINNAMON PO Take 1 tablet by mouth daily.     clopidogrel (PLAVIX) 75 MG tablet Take 75 mg by mouth daily.     doxazosin (CARDURA) 2 MG tablet Take 2 mg by mouth every evening.     doxycycline (ADOXA) 50 MG tablet  Take 50 mg by mouth daily at 12 noon.     empagliflozin (JARDIANCE) 25 MG TABS tablet Take 12.5 mg by mouth daily.     fluticasone (FLONASE) 50 MCG/ACT nasal spray Place 2 sprays into both nostrils at bedtime as needed for allergies or rhinitis.      furosemide (LASIX) 20 MG tablet Take 20 mg by mouth daily at 12 noon.      gabapentin (NEURONTIN) 100 MG capsule Take 200 mg by mouth every evening.     GARLIC PO Take 1 tablet by mouth daily.     hydrocortisone 2.5 % ointment Apply 1 Application topically daily as needed (skin irritation on face).     insulin glargine-yfgn (SEMGLEE, YFGN,) 100 UNIT/ML Pen Inject 15 Units into the skin every evening.     Insulin Pen Needle (PEN NEEDLES) 32G X 4 MM MISC Inject q daily with Rx of Lantus DxE-11.22 for 90 days     isosorbide mononitrate (IMDUR) 30 MG 24 hr tablet TAKE 1 TABLET BY MOUTH  DAILY 90 tablet 3   L-LYSINE PO Take 1 tablet by mouth daily.     loratadine (CLARITIN) 10 MG tablet Take 10 mg by mouth daily.     losartan (COZAAR) 100 MG tablet Take 100 mg by mouth daily.     metFORMIN (GLUCOPHAGE-XR) 500 MG 24  hr tablet Take 1,000 mg by mouth 2 (two) times daily.     Mometasone Furoate 200 MCG/ACT AERO Take 200 mcg by mouth daily.     Multiple Vitamin (MULTIVITAMIN) tablet Take 1 tablet by mouth daily.     nitroGLYCERIN (NITROSTAT) 0.4 MG SL tablet Place 1 tablet (0.4 mg total) under the tongue every 5 (five) minutes as needed for chest pain. 90 tablet 3   Omega-3 Fatty Acids (FISH OIL PO) Take 1 capsule by mouth 2 (two) times daily.     pentoxifylline (TRENTAL) 400 MG CR tablet TAKE 1 TABLET BY MOUTH  TWICE DAILY 180 tablet 3   predniSONE (DELTASONE) 10 MG tablet Take 10 mg by mouth daily with breakfast.     Probiotic Product (PROBIOTIC PO) Take 1 capsule by mouth every evening.     rOPINIRole (REQUIP) 1 MG tablet Take 1 mg by mouth every evening.      Semaglutide, 2 MG/DOSE, (OZEMPIC, 2 MG/DOSE,) 8 MG/3ML SOPN Inject 2 mg into the skin See admin  instructions. Inject 2 mg once a week on Thursdays.     simvastatin (ZOCOR) 40 MG tablet Take 40 mg by mouth every evening.     triamcinolone ointment (KENALOG) 0.1 % Apply 1 Application topically 2 (two) times daily.     umeclidinium-vilanterol (ANORO ELLIPTA) 62.5-25 MCG/INH AEPB INHALE 1 INHALATION BY  MOUTH INTO THE LUNGS DAILY 180 each 3   No current facility-administered medications for this visit.    REVIEW OF SYSTEMS (negative unless checked):   Cardiac:  []  Chest pain or chest pressure? []  Shortness of breath upon activity? []  Shortness of breath when lying flat? []  Irregular heart rhythm?  Vascular:  []  Pain in calf, thigh, or hip brought on by walking? []  Pain in feet at night that wakes you up from your sleep? []  Blood clot in your veins? []  Leg swelling?  Pulmonary:  []  Oxygen at home? []  Productive cough? []  Wheezing?  Neurologic:  []  Sudden weakness in arms or legs? []  Sudden numbness in arms or legs? []  Sudden onset of difficult speaking or slurred speech? []  Temporary loss of vision in one eye? []  Problems with dizziness?  Gastrointestinal:  []  Blood in stool? []  Vomited blood?  Genitourinary:  []  Burning when urinating? []  Blood in urine?  Psychiatric:  []  Major depression  Hematologic:  []  Bleeding problems? []  Problems with blood clotting?  Dermatologic:  []  Rashes or ulcers?  Constitutional:  []  Fever or chills?  Ear/Nose/Throat:  []  Change in hearing? []  Nose bleeds? []  Sore throat?  Musculoskeletal:  []  Back pain? []  Joint pain? []  Muscle pain?   Physical Examination   Vitals:   04/02/24 1124 04/02/24 1126  BP: 125/68 124/67  Pulse: 61   Temp: 97.7 F (36.5 C)   TempSrc: Temporal   SpO2: 92%   Weight: 225 lb 1.6 oz (102.1 kg)   Height: 5\' 8"  (1.727 m)    Body mass index is 34.23 kg/m.  General:  WDWN in NAD; vital signs documented above Gait: Not observed HENT: WNL, normocephalic Pulmonary: normal non-labored  breathing , without rales, rhonchi,  wheezing Cardiac: regular Abdomen: soft, NT, no masses Skin: without rashes Vascular Exam/Pulses: palpable radial pulses bilaterally Extremities: without ischemic changes, without gangrene , without cellulitis; without open wounds;  Musculoskeletal: no muscle wasting or atrophy  Neurologic: A&O X 3;  No focal weakness or paresthesias are detected Psychiatric:  The pt has Normal affect.  Non-Invasive Vascular Imaging   Bilateral  Carotid Duplex (04/02/2024):  R ICA stenosis:  40-59% R VA:  patent and antegrade L ICA stenosis:  1-39% L VA:  patent and antegrade   Medical Decision Making   ELEUTERIO DOLLAR is a 78 y.o. male who presents for surveillance of carotid artery stenosis  Based on the patient's vascular studies, his carotid artery stenosis is stable bilaterally.  His right ICA stenosis is 40 to 59%, and it was last measured as 60 to 79% in October 2024.  His left ICA stenosis is 1 to 39% He recently had an acute left thalamic stroke in October, causing transient dizziness and slurred speech.  Neurology felt this was due to large intracranial vessel disease.  He was on 3 months of dual antiplatelet therapy, and he is now on Plavix alone. Since his stroke in October, he denies any recurrent strokelike symptoms such as slurred speech, facial droop, sudden visual changes, or sudden weakness/numbness.  His neuroexam is back at baseline.  He has palpable and equal radial pulses bilaterally He will continue his daily Plavix and statin and follow up with our office in 6 months with repeat carotid duplex   Loel Dubonnet PA-C [facility] Office: [phone]  Clinic MD: Myra Gianotti

## 2024-04-04 ENCOUNTER — Other Ambulatory Visit: Payer: Self-pay

## 2024-04-04 DIAGNOSIS — I6521 Occlusion and stenosis of right carotid artery: Secondary | ICD-10-CM

## 2024-06-04 ENCOUNTER — Other Ambulatory Visit (HOSPITAL_COMMUNITY): Payer: Self-pay

## 2024-06-04 DIAGNOSIS — Z0189 Encounter for other specified special examinations: Secondary | ICD-10-CM

## 2024-06-11 ENCOUNTER — Ambulatory Visit (HOSPITAL_COMMUNITY)
Admission: RE | Admit: 2024-06-11 | Discharge: 2024-06-11 | Disposition: A | Discharge status: Home / Self Care | Source: Ambulatory Visit

## 2024-06-11 DIAGNOSIS — Z0189 Encounter for other specified special examinations: Secondary | ICD-10-CM | POA: Insufficient documentation

## 2024-07-24 NOTE — Progress Notes (Signed)
 [facility]   Date:  07/24/2024   ID:  Anthony [name], DOB 05-27-1946, MRN [id number]  Location: Home  Provider location: [facility] Type of Visit: Established patient  PCP:  Onita Rush, MD  HF Cardiologist: Bensimhon  HPI: Anthony [name] is a 78 y.o. male with h/o obesity, HTN, HL, severe osteoarthritis, DM2 (diagnosed 2009), OSA, former smoker (quit 1989), CAD s/p stent 2001 and PAD. He is the father-in-law of Dr. Juliene Balder in Interventional Radiology.    Had cath in [place]  in 2001. Was told he had 70% blockage in one artery and the one in the back was totally blocked. Eventually underwent stenting.    Has severe PAD R>L followed by Dr. Serene. Now status post right external iliac, common femoral, superficial femoral endarterectomy with bovine pericardial patch angioplasty on 10/10/2014  Saw Dr. Shellia and placed on home O2 but now weaned off with inhalers. Checks sats with pulse oximeter   Echo 8/20 EF 50% RV ok.   Was having more SOB and some CP. Underwent repeat cath 05/11/19  1. Left dominant coronary system with 2V CAD 2. Small dominant RCA with chronic proximal total occlusion 3. Widely patent dominant LCX 4. High grade 95% proximal LAD likely within or just prior to previous stent. Probable chronic occlusion of distal LAD but not seen well -> PCI/DES to prox LAD and POBA of D1 5. LVEF 60-65% with normal filling pressures 6. Normal right heart pressures   11/21 Had extensive endarterectomy of left external iliac, CFA and SFA with patch angioplasty and stenting.   Sleep study with severe OS - AHI 39.    PFT 11/28/19 >> FEV1 2.30 (78%), FEV1% 75, TLC 5.84 (85%), DLCO 70%, no BD  Had left temporal CVA 10/03/23, treated medically. Echo 10/24: EF 55-60%, normal RV  Today he returns for HF follow up with his wife. Overall feeling fine. Denies increasing SOB, palpitations, abnormal bleeding, CP, dizziness, edema, or  PND/Orthopnea. Weight at home 220 pounds. Taking all medications. Seeing Dr. Tor at [facility]. Wears CPAP nightly. Labs with PCP ~ 07/05/24 showed K 4.5, SCr 1.4, stable. Planning travel to [place] soon, just got back from [place], [place] and [place]    Past Medical History:  Diagnosis Date   Atherosclerosis of native artery of both lower extremities with intermittent claudication (HCC)    followed by vascular , dr serene;  s/p angioplasty and stenting bilaterally 2015 and 11/ 2021 (per lov note 06-08-2021 bilateral iliofemoral intervention's are widely patent)   CAD (coronary artery disease)    cardiologist-- dr cherrie;  s/p stent placed 06/02/2000 in [place] ;  s/p cath with patent LCx , PCI w/ DES to pLAD and PCI to POBA of D1   Chronic diastolic (congestive) heart failure (HCC)    followed by cardiology   COPD (chronic obstructive pulmonary disease) with chronic bronchitis [facility])    pulmonology-- dr shellia--  (09-23-2021  per pt checks O2 stats at home, a[place]age 96-97% on RA)   Edema of left lower extremity    per pt since vasuclar surgery 11/ 2021, but told mild   History of COVID-19 06/2021   per pt mild symtpoms that resolved   Hyperlipidemia    Hypertension    followed by cardiology and pcp   OA (osteoarthritis)    OSA on CPAP    followed by dr shellia--  study in epic 08-23-2012 severe osa (no additional oxygen)  PAD (peripheral artery disease) (HCC)    Peripheral neuropathy    feet   Peripheral vascular disease (HCC)    Rosacea    S/P drug eluting coronary stent placement    2001  x1 stent (unsure if DES);  05/ 2020 DES to pLAD   Shortness of breath    with exertion with stairs but recovers quickly,  ok w/ household chores   Type 2 diabetes mellitus (HCC)    followed by pcp   (09-23-2021 per pt check blood sugar at home twice weekly in am,  fasting sugar-- 170)   Past Surgical History:  Procedure Laterality Date   ABDOMINAL AORTAGRAM N/A 11/29/2012    Procedure: ABDOMINAL AORTAGRAM;  Surgeon: Gaile LELON New, MD;  Location: [facility] CATH LAB;  Service: Cardiovascular;  Laterality: N/A;   ABDOMINAL AORTAGRAM N/A 09/03/2014   Procedure: ABDOMINAL EZELLA;  Surgeon: Gaile LELON New, MD;  Location: [facility] CATH LAB;  Service: Cardiovascular;  Laterality: N/A;   ABDOMINAL AORTOGRAM W/LOWER EXTREMITY N/A 09/16/2020   Procedure: ABDOMINAL AORTOGRAM W/LOWER EXTREMITY;  Surgeon: New Gaile LELON, MD;  Location: [facility] INVASIVE CV LAB;  Service: Cardiovascular;  Laterality: N/A;   ANTERIOR CERVICAL DECOMP/DISCECTOMY FUSION  10/2001   C5--C7   ANTERIOR LATERAL LUMBAR FUSION 4 LEVELS Right 05/28/2014   Procedure: Right L4-5 L3-4 L2-3, L1-2  Anterior lateral lumbar fusion with percutaneaous pedicle screws. Lumbar four/five, three/four, two/three and possible two/one;  Surgeon: Fairy Levels, MD;  Location: [facility] NEURO ORS;  Service: Neurosurgery;  Laterality: Right;  Lumbar One-Five Fusion with Percutaneous Screws   BUNIONECTOMY Right    1991 and 1994   CARDIAC CATHETERIZATION  03/26/1999   in [place] ;  per pt medically managed , no intervention   CARPAL TUNNEL RELEASE Right 09/30/2006   CATARACT EXTRACTION W/ INTRAOCULAR LENS IMPLANT Bilateral 2014   COLONOSCOPY  08/26/2020   CORONARY ANGIOPLASTY WITH STENT PLACEMENT  06/02/2000   in [place] ; x1 stent   CORONARY STENT INTERVENTION N/A 05/11/2019   Procedure: CORONARY STENT INTERVENTION;  Surgeon: Verlin Lonni BIRCH, MD;  Location: [facility] INVAS[facility] CV LAB;  Service: Cardiovascular;  Laterality: N/A;   ENDARTERECTOMY FEMORAL Right 10/10/2014   Procedure: RIGHT FEMORAL ARTERY ENDARTERECTOMY  WITH VASCU GUARD PATCH ANGIOPLASTY;  Surgeon: Gaile LELON New, MD;  Location: [facility] OR;  Service: Vascular;  Laterality: Right;   ENDARTERECTOMY FEMORAL Left 11/05/2020   Procedure: LEFT FEMORAL ENDARTERECTOMY WITH PATCH ANGIOPLASTY;  Surgeon: New Gaile LELON, MD;  Location: [facility] OR;  Service: Vascular;  Laterality: Left;   FEMORAL  ENDARTERECTOMY Left 11/05/2020   LEFT FEMORAL ENDARTERECTOMY WITH PATCH ANGIOPLASTY (Left    FINGER ARTHRODESIS Right 09/2009   right thumb   HAMMER TOE SURGERY Left    06/ 2007 and 08/ 2008   HYDRADENITIS EXCISION N/A 09/25/2021   Procedure: EXCISION HIDRADENITIS, INTERROGATION OF PERINEAL WOUND;  Surgeon: Teresa Lonni HERO, MD;  Location: [facility];  Service: General;  Laterality: N/A;   INSERTION OF ILIAC STENT Left 11/05/2020    INSERTION OF ILIAC STENT (Left )   INSERTION OF ILIAC STENT Left 11/05/2020   Procedure: INSERTION OF ILIAC STENT;  Surgeon: New Gaile LELON, MD;  Location: [facility] OR;  Service: Vascular;  Laterality: Left;   LUMBAR PERCUTANEOUS PEDICLE SCREW 4 LEVEL N/A 05/28/2014   Procedure: LUMBAR PERCUTANEOUS PEDICLE SCREW 4 LEVEL;  Surgeon: Fairy Levels, MD;  Location: [facility];  Service: Neurosurgery;  Laterality: N/A;   LUMBAR SPINE SURGERY     12/ 1997 and 04/ 2005  NEUROMA SURGERY Right 1996   right foot   PERIPHERAL INTRAVASCULAR LITHOTRIPSY Left 11/05/2020   Procedure: INTRAVASCULAR LITHOTRIPSY;  Surgeon: Serene Gaile ORN, MD;  Location: [facility] OR;  Service: Vascular;  Laterality: Left;  shockwave   PERIPHERAL VASCULAR CATHETERIZATION N/A 12/07/2016   Procedure: Abdominal Aortogram w/Lower Extremity;  Surgeon: Gaile ORN Serene, MD;  Location: [facility] LAB;  Service: Cardiovascular;  Laterality: N/A;   RECTAL EXAM UNDER ANESTHESIA N/A 09/25/2021   Procedure: ANORECTAL EXAM UNDER ANESTHESIA;  Surgeon: Teresa Lonni HERO, MD;  Location: [facility];  Service: General;  Laterality: N/A;   RIGHT/LEFT HEART CATH AND CORONARY ANGIOGRAPHY N/A 05/11/2019   Procedure: RIGHT/LEFT HEART CATH AND CORONARY ANGIOGRAPHY;  Surgeon: Cherrie Toribio SAUNDERS, MD;  Location: [facility] LAB;  Service: Cardiovascular;  Laterality: N/A;   TONSILLECTOMY  1965   Current Outpatient Medications  Medication Sig Dispense Refill   albuterol  (VENTOLIN  HFA)  108 (90 Base) MCG/ACT inhaler Inhale 2 puffs into the lungs every 6 (six) hours as needed for wheezing or shortness of breath.     amoxicillin (AMOXIL) 875 MG tablet Take 875 mg by mouth 2 (two) times daily. X 10 days     b complex vitamins capsule Take 1 capsule by mouth daily.     BENFOTIAMINE PO Take 1 tablet by mouth 2 (two) times daily.     bisoprolol  (ZEBETA ) 5 MG tablet Take 1 tablet by mouth every evening.     carboxymethylcellulose 1 % ophthalmic solution Apply 1 drop to eye 3 (three) times daily.     CINNAMON PO Take 1 tablet by mouth daily.     clopidogrel  (PLAVIX ) 75 MG tablet Take 75 mg by mouth daily.     doxazosin  (CARDURA ) 2 MG tablet Take 2 mg by mouth every evening.     doxycycline  (ADOXA) 50 MG tablet Take 50 mg by mouth daily at 12 noon.     empagliflozin  (JARDIANCE ) 25 MG TABS tablet Take 12.5 mg by mouth daily.     fluticasone  (FLONASE ) 50 MCG/ACT nasal spray Place 2 sprays into both nostrils at bedtime as needed for allergies or rhinitis.      furosemide  (LASIX ) 20 MG tablet Take 20 mg by mouth daily at 12 noon.      gabapentin (NEURONTIN) 100 MG capsule Take 200 mg by mouth every evening.     GARLIC PO Take 1 tablet by mouth daily.     hydrocortisone  2.5 % ointment Apply 1 Application topically daily as needed (skin irritation on face).     insulin  glargine-yfgn (SEMGLEE, YFGN,) 100 UNIT/ML Pen Inject 15 Units into the skin every evening.     Insulin  Pen Needle (PEN NEEDLES) 32G X 4 MM MISC Inject q daily with Rx of Lantus DxE-11.22 for 90 days     isosorbide  mononitrate (IMDUR ) 30 MG 24 hr tablet TAKE 1 TABLET BY MOUTH  DAILY 90 tablet 3   L-LYSINE  PO Take 1 tablet by mouth daily.     loratadine (CLARITIN) 10 MG tablet Take 10 mg by mouth daily.     losartan  (COZAAR ) 100 MG tablet Take 100 mg by mouth daily.     metFORMIN  (GLUCOPHAGE -XR) 500 MG 24 hr tablet Take 1,000 mg by mouth 2 (two) times daily.     Mometasone  Furoate 200 MCG/ACT AERO Take 200 mcg by mouth daily.      Multiple Vitamin (MULTIVITAMIN) tablet Take 1 tablet by mouth daily.     nitroGLYCERIN  (NITROSTAT ) 0.4 MG  SL tablet Place 1 tablet (0.4 mg total) under the tongue every 5 (five) minutes as needed for chest pain. 90 tablet 3   Omega-3 Fatty Acids (FISH OIL PO) Take 1 capsule by mouth 2 (two) times daily.     pentoxifylline  (TRENTAL ) 400 MG CR tablet TAKE 1 TABLET BY MOUTH  TWICE DAILY 180 tablet 3   predniSONE (DELTASONE) 10 MG tablet Take 10 mg by mouth daily with breakfast.     Probiotic Product (PROBIOTIC PO) Take 1 capsule by mouth every evening.     rOPINIRole  (REQUIP ) 1 MG tablet Take 1 mg by mouth every evening.      Semaglutide, 2 MG/DOSE, (OZEMPIC, 2 MG/DOSE,) 8 MG/3ML SOPN Inject 2 mg into the skin See admin instructions. Inject 2 mg once a week on Thursdays.     simvastatin  (ZOCOR ) 40 MG tablet Take 40 mg by mouth every evening.     triamcinolone ointment (KENALOG) 0.1 % Apply 1 Application topically 2 (two) times daily.     umeclidinium-vilanterol (ANORO ELLIPTA ) 62.5-25 MCG/INH AEPB INHALE 1 INHALATION BY  MOUTH INTO THE LUNGS DAILY 180 each 3   No current facility-administered medications for this visit.   Allergies:   Beef-derived drug products, Pork-derived products, and Spiriva  respimat [tiotropium bromide  monohydrate]   Social History:  The patient  reports that he quit smoking about 37 years ago. His smoking use included cigarettes. He started smoking about 65 years ago. He has a 56 pack-year smoking history. He has never used smokeless tobacco. He reports current alcohol  use. He reports that he does not use drugs.   Family History:  The patient's family history includes Cancer in his father and mother; Deep vein thrombosis in his mother; Diabetes in his mother; Heart attack in his father; Heart disease (age of onset: 72) in his father; Hyperlipidemia in his father and mother; Hypertension in his father and mother.   ROS:  Please see the history of present illness.   All  other systems are personally reviewed and negative.   Wt Readings from Last 3 Encounters:  07/27/24 104.7 kg (230 lb 12.8 oz)  04/02/24 102.1 kg (225 lb 1.6 oz)  02/01/24 98.5 kg (217 lb 3.2 oz)   BP 138/80   Pulse 64   Ht 5' 8 (1.727 m)   Wt 104.7 kg (230 lb 12.8 oz)   SpO2 95%   BMI 35.09 kg/m   Physical Exam:  General:  NAD. No resp difficulty, walked into clinic HEENT: Normal Neck: Supple. No JVD. Cor: Regular rate & rhythm. No rubs, gallops or murmurs. Lungs: Clear, faint crackles RLL Abdomen: Soft, obese, nontender, nondistended.  Extremities: No cyanosis, clubbing, rash, edema; venous stasis changes to BLE Neuro: Alert & oriented x 3, moves all 4 extremities w/o difficulty. Affect pleasant.   Assessment & Plan:  1) Chronic Diastolic HF:   - Echo 5/16: EF 45-50% with mild RV dysfunction  - Echo 8/20: EF 50% RV ok. - Echo 10/24: EF 55-60% RV ok  - Stable NYHA II - multifactorial. Volume OK today - Continue Lasix  20 mg daily - Continue Jardiance  12.5 mg daily. - Continue losartan  100 mg daily. - Labs with PCP ~ 07/05/24 showed K 4.5, SCr 1.4, stable.  2) CAD - Last cath 05/11/19 with PCI LAD with DES and POBA of D1 - No CP - Continue Plavix /statin/Jardiance  - Most recent LDL 52, stable (reviewed from PCP visit 07/18/24)  3) PAD - s/p R femoral artery endarterectomy in 2015. -  11/21 Had extensive endarterectomy of left external iliac, CFA and SFA with patch angioplasty and stenting.  - Followed by Dr. Serene   4) HTN - Blood pressure well controlled.  - Continue current regimen.  5) OSA on CPAP - Complaint with CPAP  - No change  6) Hyperlipidemia  - Followed by Dr. Onita.  - Continue simvastatin  40 mg daily. - Goal LDL < 70, most recent LDL 52  7) COPD - [facility] Dr. Tor at [facility]  8). CKD 3a - Scr 1.3-1.4 - Continue Jardiance  - Most recent lab showed SCr 1.4, stable  Follow up in 6 months with Dr. Bensimhon. Doing  well!  Bonney Harlene CHRISTELLA Glena, FNP  07/24/2024 4:22 PM  [facility] [address] [facility] [place] [address] [phone] (office) [phone] (fax)

## 2024-07-26 ENCOUNTER — Telehealth (HOSPITAL_COMMUNITY): Payer: Self-pay

## 2024-07-26 NOTE — Telephone Encounter (Signed)
 Called to confirm/remind patient of their appointment at the [facility] on 07/27/24.   Appointment:   [] Confirmed  [x] Left mess   [] No answer/No voice mail  [] VM Full/unable to leave message  [] Phone not in service  And to bring in all medications and/or complete list.

## 2024-07-27 ENCOUNTER — Ambulatory Visit (HOSPITAL_COMMUNITY)
Admission: RE | Admit: 2024-07-27 | Discharge: 2024-07-27 | Disposition: A | Discharge status: Home / Self Care | Source: Ambulatory Visit | Attending: Family Medicine | Admitting: Family Medicine

## 2024-07-27 ENCOUNTER — Encounter (HOSPITAL_COMMUNITY): Payer: Self-pay

## 2024-07-27 VITALS — BP 138/80 | HR 64 | Ht 68.0 in | Wt 230.8 lb

## 2024-07-27 DIAGNOSIS — I739 Peripheral vascular disease, unspecified: Secondary | ICD-10-CM | POA: Diagnosis not present

## 2024-07-27 DIAGNOSIS — Z7902 Long term (current) use of antithrombotics/antiplatelets: Secondary | ICD-10-CM | POA: Diagnosis not present

## 2024-07-27 DIAGNOSIS — I2510 Atherosclerotic heart disease of native coronary artery without angina pectoris: Secondary | ICD-10-CM | POA: Diagnosis not present

## 2024-07-27 DIAGNOSIS — N183 Chronic kidney disease, stage 3 unspecified: Secondary | ICD-10-CM

## 2024-07-27 DIAGNOSIS — J449 Chronic obstructive pulmonary disease, unspecified: Secondary | ICD-10-CM | POA: Diagnosis not present

## 2024-07-27 DIAGNOSIS — M1990 Unspecified osteoarthritis, unspecified site: Secondary | ICD-10-CM | POA: Diagnosis not present

## 2024-07-27 DIAGNOSIS — I130 Hypertensive heart and chronic kidney disease with heart failure and stage 1 through stage 4 chronic kidney disease, or unspecified chronic kidney disease: Secondary | ICD-10-CM | POA: Insufficient documentation

## 2024-07-27 DIAGNOSIS — Z79899 Other long term (current) drug therapy: Secondary | ICD-10-CM | POA: Diagnosis not present

## 2024-07-27 DIAGNOSIS — E785 Hyperlipidemia, unspecified: Secondary | ICD-10-CM

## 2024-07-27 DIAGNOSIS — I10 Essential (primary) hypertension: Secondary | ICD-10-CM

## 2024-07-27 DIAGNOSIS — Z6835 Body mass index (BMI) 35.0-35.9, adult: Secondary | ICD-10-CM | POA: Insufficient documentation

## 2024-07-27 DIAGNOSIS — G4733 Obstructive sleep apnea (adult) (pediatric): Secondary | ICD-10-CM | POA: Diagnosis not present

## 2024-07-27 DIAGNOSIS — Z95820 Peripheral vascular angioplasty status with implants and grafts: Secondary | ICD-10-CM | POA: Insufficient documentation

## 2024-07-27 DIAGNOSIS — I5032 Chronic diastolic (congestive) heart failure: Secondary | ICD-10-CM | POA: Diagnosis present

## 2024-07-27 DIAGNOSIS — Z955 Presence of coronary angioplasty implant and graft: Secondary | ICD-10-CM | POA: Insufficient documentation

## 2024-07-27 DIAGNOSIS — Z794 Long term (current) use of insulin: Secondary | ICD-10-CM | POA: Diagnosis not present

## 2024-07-27 DIAGNOSIS — E669 Obesity, unspecified: Secondary | ICD-10-CM | POA: Diagnosis not present

## 2024-07-27 DIAGNOSIS — E1122 Type 2 diabetes mellitus with diabetic chronic kidney disease: Secondary | ICD-10-CM | POA: Insufficient documentation

## 2024-07-27 DIAGNOSIS — Z7985 Long-term (current) use of injectable non-insulin antidiabetic drugs: Secondary | ICD-10-CM | POA: Diagnosis not present

## 2024-07-27 DIAGNOSIS — Z87891 Personal history of nicotine dependence: Secondary | ICD-10-CM | POA: Diagnosis not present

## 2024-07-27 DIAGNOSIS — N1831 Chronic kidney disease, stage 3a: Secondary | ICD-10-CM | POA: Diagnosis not present

## 2024-07-27 DIAGNOSIS — Z7984 Long term (current) use of oral hypoglycemic drugs: Secondary | ICD-10-CM | POA: Diagnosis not present

## 2024-07-27 NOTE — Patient Instructions (Addendum)
 Good to see you today!  No medication changes were made  Your physician recommends that you schedule a follow-up appointment 6 months(February) Call office in January to schedule an appointment  If you have any questions or concerns before your next appointment please send us  a message through [facility] or call our office at [phone].    TO LEAVE A MESSAGE FOR THE NURSE SELECT OPTION 2, PLEASE LEAVE A MESSAGE INCLUDING: YOUR NAME DATE OF BIRTH CALL BACK NUMBER REASON FOR CALL**this is important as we prioritize the call backs  YOU WILL RECEIVE A CALL BACK THE SAME DAY AS LONG AS YOU CALL BEFORE 4:00 PM  At the [facility], you and your health needs are our priority. As part of our continuing mission to provide you with exceptional heart care, we have created designated Provider Care Teams. These Care Teams include your primary Cardiologist (physician) and Advanced Practice Providers (APPs- Physician Assistants and Nurse Practitioners) who all work together to provide you with the care you need, when you need it.   You may see any of the following providers on your designated Care Team at your next follow up: Dr Toribio Fuel Dr Ezra Shuck Dr. Ria Commander Dr. Morene Brownie Amy Lenetta, NP Caffie Shed, [place] [facility], [place] Beckey Coe, NP [place] Lee, NP Ellouise Class, NP Tinnie Redman, PharmD Jaun Bash, PharmD   Please be sure to bring in all your medications bottles to every appointment.    Thank you for choosing [facility]

## 2024-09-12 ENCOUNTER — Other Ambulatory Visit: Payer: Self-pay

## 2024-09-12 DIAGNOSIS — I6521 Occlusion and stenosis of right carotid artery: Secondary | ICD-10-CM

## 2024-10-15 ENCOUNTER — Ambulatory Visit

## 2024-10-15 ENCOUNTER — Encounter (HOSPITAL_COMMUNITY)

## 2024-11-16 NOTE — Progress Notes (Unsigned)
 HISTORY AND PHYSICAL     CC:  follow up. Requesting Provider:  Onita Rush, MD  HPI: This is a 78 y.o. male here for follow up for carotid artery stenosis.  He also has hx of PAD with hx described below.    Vascular hx: -right external iliac, common femoral, and superficial femoral artery endarterectomy with bovine pericardial patch angioplasty on 10/10/2014 by Dr. Serene for right leg claudication. - Extensive endarterectomy of the left external iliac, common femoral, superficial femoral, and profundofemoral artery, bovine pericardial patch angioplasty of the left common femoral and profundofemoral artery, separate bovine pericardial patch angioplasty of the right superficial femoral artery, arterial lithotripsy of the left common and left external iliac artery, stent, left common and left external iliac artery 11/05/2020 Dr. Serene  Pt was last seen 04/02/2024 and at that time he was not having any lower extremity sx.  He was hospitalized in October 2024 for acute left thalamic infarct that was felt to be due to intracranial large vessel disease.  He was started on asa/plavix  for 3 months and then just on Plavix  thereafter.    Pt returns today for follow up.    Pt denies any amaurosis fugax, speech difficulties, weakness, numbness, paralysis or clumsiness or facial droop.    He does get some right leg claudication but denies rest pain or non healing wounds.   The pt is on a statin for cholesterol management.  The pt is not on a daily aspirin .   Other AC:  Plavix  The pt is on ARB, diuretic, BB for hypertension.   The pt is  on medication for diabetes Tobacco hx:  former  Pt does not have family hx of AAA.  Past Medical History:  Diagnosis Date   Atherosclerosis of native artery of both lower extremities with intermittent claudication    followed by vascular , dr serene;  s/p angioplasty and stenting bilaterally 2015 and 11/ 2021 (per lov note 06-08-2021 bilateral iliofemoral  intervention's are widely patent)   CAD (coronary artery disease)    cardiologist-- dr cherrie;  s/p stent placed 06/02/2000 in [place] ;  s/p cath with patent LCx , PCI w/ DES to pLAD and PCI to POBA of D1   Chronic diastolic (congestive) heart failure (HCC)    followed by cardiology   COPD (chronic obstructive pulmonary disease) with chronic bronchitis [facility])    pulmonology-- dr shellia--  (09-23-2021  per pt checks O2 stats at home, [place]age 96-97% on RA)   Edema of left lower extremity    per pt since vasuclar surgery 11/ 2021, but told mild   History of COVID-19 06/2021   per pt mild symtpoms that resolved   Hyperlipidemia    Hypertension    followed by cardiology and pcp   OA (osteoarthritis)    OSA on CPAP    followed by dr shellia--  study in epic 08-23-2012 severe osa (no additional oxygen)   PAD (peripheral artery disease)    Peripheral neuropathy    feet   Peripheral vascular disease    Rosacea    S/P drug eluting coronary stent placement    2001  x1 stent (unsure if DES);  05/ 2020 DES to pLAD   Shortness of breath    with exertion with stairs but recovers quickly,  ok w/ household chores   Type 2 diabetes mellitus (HCC)    followed by pcp   (09-23-2021 per pt check blood sugar at home twice weekly in am,  fasting sugar-- 170)  Past Surgical History:  Procedure Laterality Date   ABDOMINAL AORTAGRAM N/A 11/29/2012   Procedure: ABDOMINAL AORTAGRAM;  Surgeon: Gaile LELON New, MD;  Location: [facility] CATH LAB;  Service: Cardiovascular;  Laterality: N/A;   ABDOMINAL AORTAGRAM N/A 09/03/2014   Procedure: ABDOMINAL EZELLA;  Surgeon: Gaile LELON New, MD;  Location: [facility] CATH LAB;  Service: Cardiovascular;  Laterality: N/A;   ABDOMINAL AORTOGRAM W/LOWER EXTREMITY N/A 09/16/2020   Procedure: ABDOMINAL AORTOGRAM W/LOWER EXTREMITY;  Surgeon: New Gaile LELON, MD;  Location: [facility] INVASIVE CV LAB;  Service: Cardiovascular;  Laterality: N/A;   ANTERIOR CERVICAL DECOMP/DISCECTOMY FUSION   10/2001   C5--C7   ANTERIOR LATERAL LUMBAR FUSION 4 LEVELS Right 05/28/2014   Procedure: Right L4-5 L3-4 L2-3, L1-2  Anterior lateral lumbar fusion with percutaneaous pedicle screws. Lumbar four/five, three/four, two/three and possible two/one;  Surgeon: Fairy Levels, MD;  Location: [facility] NEURO ORS;  Service: Neurosurgery;  Laterality: Right;  Lumbar One-Five Fusion with Percutaneous Screws   BUNIONECTOMY Right    1991 and 1994   CARDIAC CATHETERIZATION  03/26/1999   in [place] ;  per pt medically managed , no intervention   CARPAL TUNNEL RELEASE Right 09/30/2006   CATARACT EXTRACTION W/ INTRAOCULAR LENS IMPLANT Bilateral 2014   COLONOSCOPY  08/26/2020   CORONARY ANGIOPLASTY WITH STENT PLACEMENT  06/02/2000   in [place] ; x1 stent   CORONARY STENT INTERVENTION N/A 05/11/2019   Procedure: CORONARY STENT INTERVENTION;  Surgeon: Verlin Lonni BIRCH, MD;  Location: [facility] INVASIVE CV LAB;  Service: Cardiovascular;  Laterality: N/A;   ENDARTERECTOMY FEMORAL Right 10/10/2014   Procedure: RIGHT FEMORAL ARTERY ENDARTERECTOMY  WITH VASCU GUARD PATCH ANGIOPLASTY;  Surgeon: Gaile LELON New, MD;  Location: [facility] OR;  Service: Vascular;  Laterality: Right;   ENDARTERECTOMY FEMORAL Left 11/05/2020   Procedure: LEFT FEMORAL ENDARTERECTOMY WITH PATCH ANGIOPLASTY;  Surgeon: New Gaile LELON, MD;  Location: [facility] OR;  Service: Vascular;  Laterality: Left;   FEMORAL ENDARTERECTOMY Left 11/05/2020   LEFT FEMORAL ENDARTERECTOMY WITH PATCH ANGIOPLASTY (Left    FINGER ARTHRODESIS Right 09/2009   right thumb   HAMMER TOE SURGERY Left    06/ 2007 and 08/ 2008   HYDRADENITIS EXCISION N/A 09/25/2021   Procedure: EXCISION HIDRADENITIS, INTERROGATION OF PERINEAL WOUND;  Surgeon: Teresa Lonni HERO, MD;  Location: [facility];  Service: General;  Laterality: N/A;   INSERTION OF ILIAC STENT Left 11/05/2020    INSERTION OF ILIAC STENT (Left )   INSERTION OF ILIAC STENT Left 11/05/2020   Procedure: INSERTION OF  ILIAC STENT;  Surgeon: New Gaile LELON, MD;  Location: [facility] OR;  Service: Vascular;  Laterality: Left;   LUMBAR PERCUTANEOUS PEDICLE SCREW 4 LEVEL N/A 05/28/2014   Procedure: LUMBAR PERCUTANEOUS PEDICLE SCREW 4 LEVEL;  Surgeon: Fairy Levels, MD;  Location: [facility] NEURO [facility]S;  Service: Neurosurgery;  Laterality: N/A;   LUMBAR SPINE SURGERY     12/ 1997 and 04/ 2005   NEUROMA SURGERY Right 1996   right foot   PERIPHERAL INTRAVASCULAR LITHOTRIPSY Left 11/05/2020   Procedure: INTRAVASCULAR LITHOTRIPSY;  Surgeon: New Gaile LELON, MD;  Location: [facility] OR;  Service: Vascular;  Laterality: Left;  shockwave   PERIPHERAL VASCULAR CATHETERIZATION N/A 12/07/2016   Procedure: Abdominal Aortogram w/Lower Extremity;  Surgeon: Gaile LELON New, MD;  Location: [facility] INVAS[facility] CV LAB;  Service: Cardiovascular;  Laterality: N/A;   RECTAL EXAM UNDER ANESTHESIA N/A 09/25/2021   Procedure: ANORECTAL EXAM UNDER ANESTHESIA;  Surgeon: Teresa Lonni HERO, MD;  Location: [facility];  Service: General;  Laterality: N/A;   RIGHT/LEFT HEART CATH AND CORONARY ANGIOGRAPHY N/A 05/11/2019   Procedure: RIGHT/LEFT HEART CATH AND CORONARY ANGIOGRAPHY;  Surgeon: Cherrie Toribio SAUNDERS, MD;  Location: [facility] INVASIVE CV LAB;  Service: Cardiovascular;  Laterality: N/A;   TONSILLECTOMY  1965    Allergies  Allergen Reactions   Bovine (Beef) Protein-Containing Drug Products Diarrhea   Porcine (Pork) Protein-Containing Drug Products Diarrhea   Spiriva  Respimat [Tiotropium Bromide ] Other (See Comments)    Throat irritation    Current Outpatient Medications  Medication Sig Dispense Refill   albuterol  (VENTOLIN  HFA) 108 (90 Base) MCG/ACT inhaler Inhale 2 puffs into the lungs every 6 (six) hours as needed for wheezing or shortness of breath.     b complex vitamins capsule Take 1 capsule by mouth daily.     BENFOTIAMINE PO Take 1 tablet by mouth 2 (two) times daily.     bisoprolol  (ZEBETA ) 5 MG tablet Take 1 tablet by mouth every  evening.     carboxymethylcellulose 1 % ophthalmic solution Apply 1 drop to eye 3 (three) times daily.     CINNAMON PO Take 1 tablet by mouth daily.     clopidogrel  (PLAVIX ) 75 MG tablet Take 75 mg by mouth daily.     doxazosin  (CARDURA ) 2 MG tablet Take 2 mg by mouth every evening.     doxycycline  (ADOXA) 50 MG tablet Take 50 mg by mouth daily at 12 noon.     empagliflozin  (JARDIANCE ) 25 MG TABS tablet Take 12.5 mg by mouth daily.     fluticasone  (FLONASE ) 50 MCG/ACT nasal spray Place 2 sprays into both nostrils at bedtime as needed for allergies or rhinitis.      furosemide  (LASIX ) 20 MG tablet Take 20 mg by mouth daily at 12 noon.      gabapentin (NEURONTIN) 100 MG capsule Take 200 mg by mouth every evening.     GARLIC PO Take 1 tablet by mouth daily.     hydrocortisone  2.5 % ointment Apply 1 Application topically daily as needed (skin irritation on face).     insulin  glargine-yfgn (SEMGLEE, YFGN,) 100 UNIT/ML Pen Inject 15 Units into the skin every evening.     Insulin  Pen Needle (PEN NEEDLES) 32G X 4 MM MISC Inject q daily with Rx of Lantus DxE-11.22 for 90 days     isosorbide  mononitrate (IMDUR ) 30 MG 24 hr tablet TAKE 1 TABLET BY MOUTH  DAILY 90 tablet 3   L-LYSINE  PO Take 1 tablet by mouth daily.     loperamide (IMODIUM) 2 MG capsule Take 2 mg by mouth.     loratadine (CLARITIN) 10 MG tablet Take 10 mg by mouth daily.     losartan  (COZAAR ) 100 MG tablet Take 100 mg by mouth daily.     metFORMIN  (GLUCOPHAGE -XR) 500 MG 24 hr tablet Take 1,000 mg by mouth 2 (two) times daily.     Mometasone  Furoate 200 MCG/ACT AERO Take 200 mcg by mouth daily.     Multiple Vitamin (MULTIVITAMIN) tablet Take 1 tablet by mouth daily.     nitroGLYCERIN  (NITROSTAT ) 0.4 MG SL tablet Place 1 tablet (0.4 mg total) under the tongue every 5 (five) minutes as needed for chest pain. 90 tablet 3   Omega-3 Fatty Acids (FISH OIL PO) Take 1 capsule by mouth 2 (two) times daily.     pentoxifylline  (TRENTAL ) 400 MG CR  tablet TAKE 1 TABLET BY MOUTH  TWICE DAILY 180 tablet 3   predniSONE (DELTASONE) 10 MG tablet Take 10  mg by mouth daily with breakfast. (Patient not taking: Reported on 07/27/2024)     Probiotic Product (PROBIOTIC PO) Take 1 capsule by mouth every evening.     rOPINIRole  (REQUIP ) 1 MG tablet Take 1 mg by mouth every evening.      Semaglutide, 2 MG/DOSE, (OZEMPIC, 2 MG/DOSE,) 8 MG/3ML SOPN Inject 2 mg into the skin See admin instructions. Inject 2 mg once a week on Thursdays.     simvastatin  (ZOCOR ) 40 MG tablet Take 40 mg by mouth every evening.     triamcinolone ointment (KENALOG) 0.1 % Apply 1 Application topically 2 (two) times daily.     umeclidinium-vilanterol (ANORO ELLIPTA ) 62.5-25 MCG/INH AEPB INHALE 1 INHALATION BY  MOUTH INTO THE LUNGS DAILY 180 each 3   No current facility-administered medications for this visit.    Family History  Problem Relation Age of Onset   Heart disease Father 37   Cancer Father    Hyperlipidemia Father    Hypertension Father    Heart attack Father    Cancer Mother    Deep vein thrombosis Mother        Varicose veins   Diabetes Mother    Hyperlipidemia Mother    Hypertension Mother     Social History   Socioeconomic History   Marital status: Married    Spouse name: Aldona   Number of children: 1   Years of education: 13   Highest education level: Not on file  Occupational History   Occupation: retired  Tobacco Use   Smoking status: Former    Current packs/day: 0.00    Average packs/day: 2.0 packs/day for 28.0 years (56.0 ttl pk-yrs)    Types: Cigarettes    Start date: 08/03/1959    Quit date: 08/03/1987    Years since quitting: 37.3   Smokeless tobacco: Never  Vaping Use   Vaping status: Never Used  Substance and Sexual Activity   Alcohol  use: Yes    Comment: socially   Drug use: No   Sexual activity: Yes  Other Topics Concern   Not on file  Social History Narrative   Spend time with family, 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,  engineering geologist)   Social Drivers of Corporate Investment Banker Strain: Not on file  Food Insecurity: No Food Insecurity (10/05/2023)   Hunger Vital Sign    Worried About Running Out of Food in the Last Year: Never true    Ran Out of Food in the Last Year: Never true  Transportation Needs: No Transportation Needs (10/05/2023)   PRAPARE - Administrator, Civil Service (Medical): No    Lack of Transportation (Non-Medical): No  Physical Activity: Not on file  Stress: Not on file  Social Connections: Not on file  Intimate Partner Violence: Not At Risk (10/05/2023)   Humiliation, Afraid, Rape, and Kick questionnaire    Fear of Current or Ex-Partner: No    Emotionally Abused: No    Physically Abused: No    Sexually Abused: No     REVIEW OF SYSTEMS:   [X]  denotes positive finding, [ ]  denotes negative finding Cardiac  Comments:  Chest pain or chest pressure:    Shortness of breath upon exertion:    Short of breath when lying flat:    Irregular heart rhythm:        Vascular    Pain in calf, thigh, or hip brought on by ambulation: x   Pain in feet at night that wakes  you up from your sleep:     Blood clot in your veins:    Leg swelling:         Pulmonary    Oxygen at home:    Productive cough:     Wheezing:         Neurologic    Sudden weakness in arms or legs:     Sudden numbness in arms or legs:     Sudden onset of difficulty speaking or slurred speech:    Temporary loss of vision in one eye:     Problems with dizziness:         Gastrointestinal    Blood in stool:     Vomited blood:         Genitourinary    Burning when urinating:     Blood in urine:        Psychiatric    Major depression:         Hematologic    Bleeding problems:    Problems with blood clotting too easily:        Skin    Rashes or ulcers:        Constitutional    Fever or chills:      PHYSICAL EXAMINATION:  Today's Vitals   11/19/24 1344 11/19/24 1346  BP: (!) 106/55 106/60   Pulse: 73 73  Temp: 98.4 F (36.9 C)   TempSrc: Temporal   Weight: 232 lb 9.6 oz (105.5 kg)   PainSc: 0-No pain    Body mass index is 35.37 kg/m.   General:  WDWN in NAD; vital signs documented above Gait: Not observed HENT: WNL, normocephalic Pulmonary: normal non-labored breathing Cardiac: regular HR, without carotid bruits Abdomen: soft, NT; aortic pulse is not palpable Skin: without rashes Vascular Exam/Pulses:  Right Left  Radial 2+ (normal) 2+ (normal)  DP monophasic multiphasic  PT monophasic multiphasic   Extremities: without open wounds Musculoskeletal: no muscle wasting or atrophy  Neurologic: A&O X 3; moving all extremities equally; speech is fluent/normal Psychiatric:  The pt has Normal affect.   Non-Invasive Vascular Imaging:   Carotid Duplex on 11/19/2024 Right:  40-59% ICA stenosis Left:  1-39% ICA stenosis Vertebrals:  Bilateral vertebral arteries demonstrate antegrade flow.  Subclavians: Normal flow hemodynamics were seen in bilateral subclavian arteries.   Previous Carotid duplex on 04/02/2024: Right: 40-59% ICA stenosis Left:   1-39% ICA stenosis    ASSESSMENT/PLAN:: 78 y.o. male here for follow up carotid artery stenosis and also has hx of PAD with: -right external iliac, common femoral, and superficial femoral artery endarterectomy with bovine pericardial patch angioplasty on 10/10/2014 by Dr. Serene for right leg claudication. - Extensive endarterectomy of the left external iliac, common femoral, superficial femoral, and profundofemoral artery, bovine pericardial patch angioplasty of the left common femoral and profundofemoral artery, separate bovine pericardial patch angioplasty of the right superficial femoral artery, arterial lithotripsy of the left common and left external iliac artery, stent, left common and left external iliac artery 11/05/2020 Dr. Serene  Carotid disease -duplex today reveals he remains in the 40-59% stenosis on the right  and 1-39% on the left.  He has remained in the 40-59% range for the past two visit.  Will extend him out one year. -discussed s/s of stroke with pt and he understands should he develop any of these sx, he will go to the nearest ER or call 911. -pt will f/u in one year with carotid duplex  PAD -will need bilateral aortoiliac duplex  and ABI-will get these scheduled by the end of the year. -pt will call sooner should he have any issues. -continue statin/Plavix   -when he returns for his lower extremity studies, will also schedule his carotid ultrasound at that time.    Lucie Apt, [facility] [phone]  Clinic MD:  Gretta on call MD

## 2024-11-19 ENCOUNTER — Ambulatory Visit (HOSPITAL_COMMUNITY)
Admission: RE | Admit: 2024-11-19 | Discharge: 2024-11-19 | Disposition: A | Discharge status: Home / Self Care | Source: Ambulatory Visit | Attending: Surgery | Admitting: Surgery

## 2024-11-19 ENCOUNTER — Ambulatory Visit (INDEPENDENT_AMBULATORY_CARE_PROVIDER_SITE_OTHER): Admitting: Physician Assistant

## 2024-11-19 VITALS — BP 106/60 | HR 73 | Temp 98.4°F | Wt 232.6 lb

## 2024-11-19 DIAGNOSIS — I6523 Occlusion and stenosis of bilateral carotid arteries: Secondary | ICD-10-CM | POA: Insufficient documentation

## 2024-11-19 DIAGNOSIS — I6521 Occlusion and stenosis of right carotid artery: Secondary | ICD-10-CM | POA: Diagnosis present

## 2024-11-19 DIAGNOSIS — I739 Peripheral vascular disease, unspecified: Secondary | ICD-10-CM

## 2024-11-21 ENCOUNTER — Other Ambulatory Visit: Payer: Self-pay

## 2024-11-21 DIAGNOSIS — I70213 Atherosclerosis of native arteries of extremities with intermittent claudication, bilateral legs: Secondary | ICD-10-CM

## 2024-12-19 NOTE — Progress Notes (Signed)
 " HISTORY AND PHYSICAL     CC:  follow up. Requesting Provider:  Onita Rush, MD  HPI: This is a 78 y.o. male who is here today for follow up for PAD and carotid artery stenosis.    Vascular hx: -right external iliac, common femoral, and superficial femoral artery endarterectomy with bovine pericardial patch angioplasty on 10/10/2014 by Dr. Serene for right leg claudication. - Extensive endarterectomy of the left external iliac, common femoral, superficial femoral, and profundofemoral artery, bovine pericardial patch angioplasty of the left common femoral and profundofemoral artery, separate bovine pericardial patch angioplasty of the right superficial femoral artery, arterial lithotripsy of the left common and left external iliac artery, stent, left common and left external iliac artery 11/05/2020 Dr. Serene  Pt was last seen 11/19/2024 and at that time, he was not having any neurological sx.  He did get some right leg claudication but no rest pain or non healing wounds.  He had monophasic doppler signals on the right foot and multiphasic on the left.  His carotid duplex was 40-59% right ICA stenosis and left 1-39% ICA stenosis.  Given it was in the 40-59% range twice, will push out one year.   The pt returns today for follow up and here with his wife.  He denies any claudication, rest pain or non healing wounds.  He does get some burning in his feet at night.  He states he does have some sciatica pains.    The pt is on a statin for cholesterol management.    The pt is not on an aspirin .    Other AC:  Plavix  The pt is on ARB, diuretic for hypertension.  The pt is  on diabetic medication. Tobacco hx:  former  Pt does not have family hx of AAA.  Past Medical History:  Diagnosis Date   Atherosclerosis of native artery of both lower extremities with intermittent claudication    followed by vascular , dr serene;  s/p angioplasty and stenting bilaterally 2015 and 11/ 2021 (per lov note  06-08-2021 bilateral iliofemoral intervention's are widely patent)   CAD (coronary artery disease)    cardiologist-- dr cherrie;  s/p stent placed 06/02/2000 in [place] ;  s/p cath with patent LCx , PCI w/ DES to pLAD and PCI to POBA of D1   Chronic diastolic (congestive) heart failure (HCC)    followed by cardiology   COPD (chronic obstructive pulmonary disease) with chronic bronchitis [facility])    pulmonology-- dr shellia--  (09-23-2021  per pt checks O2 stats at home, [place] 96-97% on RA)   Edema of left lower extremity    per pt since vasuclar surgery 11/ 2021, but told mild   History of COVID-19 06/2021   per pt mild symtpoms that resolved   Hyperlipidemia    Hypertension    followed by cardiology and pcp   OA (osteoarthritis)    OSA on CPAP    followed by dr shellia--  study in epic 08-23-2012 severe osa (no additional oxygen)   PAD (peripheral artery disease)    Peripheral neuropathy    feet   Peripheral vascular disease    Rosacea    S/P drug eluting coronary stent placement    2001  x1 stent (unsure if DES);  05/ 2020 DES to pLAD   Shortness of breath    with exertion with stairs but recovers quickly,  ok w/ household chores   Type 2 diabetes mellitus (HCC)    followed by pcp   (09-23-2021  per pt check blood sugar at home twice weekly in am,  fasting sugar-- 170)    Past Surgical History:  Procedure Laterality Date   ABDOMINAL AORTAGRAM N/A 11/29/2012   Procedure: ABDOMINAL AORTAGRAM;  Surgeon: Gaile LELON New, MD;  Location: [facility] CATH LAB;  Service: Cardiovascular;  Laterality: N/A;   ABDOMINAL AORTAGRAM N/A 09/03/2014   Procedure: ABDOMINAL EZELLA;  Surgeon: Gaile LELON New, MD;  Location: [facility] CATH LAB;  Service: Cardiovascular;  Laterality: N/A;   ABDOMINAL AORTOGRAM W/LOWER EXTREMITY N/A 09/16/2020   Procedure: ABDOMINAL AORTOGRAM W/LOWER EXTREMITY;  Surgeon: New Gaile LELON, MD;  Location: [facility] INVASIVE CV LAB;  Service: Cardiovascular;  Laterality: N/A;   ANTERIOR CERVICAL  DECOMP/DISCECTOMY FUSION  10/2001   C5--C7   ANTERIOR LATERAL LUMBAR FUSION 4 LEVELS Right 05/28/2014   Procedure: Right L4-5 L3-4 L2-3, L1-2  Anterior lateral lumbar fusion with percutaneaous pedicle screws. Lumbar four/five, three/four, two/three and possible two/one;  Surgeon: Fairy Levels, MD;  Location: [facility] NEURO ORS;  Service: Neurosurgery;  Laterality: Right;  Lumbar One-Five Fusion with Percutaneous Screws   BUNIONECTOMY Right    1991 and 1994   CARDIAC CATHETERIZATION  03/26/1999   in [place] ;  per pt medically managed , no intervention   CARPAL TUNNEL RELEASE Right 09/30/2006   CATARACT EXTRACTION W/ INTRAOCULAR LENS IMPLANT Bilateral 2014   COLONOSCOPY  08/26/2020   CORONARY ANGIOPLASTY WITH STENT PLACEMENT  06/02/2000   in [place] ; x1 stent   CORONARY STENT INTERVENTION N/A 05/11/2019   Procedure: CORONARY STENT INTERVENTION;  Surgeon: Verlin Lonni BIRCH, MD;  Location: [facility] INVASIVE CV LAB;  Service: Cardiovascular;  Laterality: N/A;   ENDARTERECTOMY FEMORAL Right 10/10/2014   Procedure: RIGHT FEMORAL ARTERY ENDARTERECTOMY  WITH VASCU GUARD PATCH ANGIOPLASTY;  Surgeon: Gaile LELON New, MD;  Location: [facility] OR;  Service: Vascular;  Laterality: Right;   ENDARTERECTOMY FEMORAL Left 11/05/2020   Procedure: LEFT FEMORAL ENDARTERECTOMY WITH PATCH ANGIOPLASTY;  Surgeon: New Gaile LELON, MD;  Location: [facility] OR;  Service: Vascular;  Laterality: Left;   FEMORAL ENDARTERECTOMY Left 11/05/2020   LEFT FEMORAL ENDARTERECTOMY WITH PATCH ANGIOPLASTY (Left    FINGER ARTHRODESIS Right 09/2009   right thumb   HAMMER TOE SURGERY Left    06/ 2007 and 08/ 2008   HYDRADENITIS EXCISION N/A 09/25/2021   Procedure: EXCISION HIDRADENITIS, INTERROGATION OF PERINEAL WOUND;  Surgeon: Teresa Lonni HERO, MD;  Location: [facility];  Service: General;  Laterality: N/A;   INSERTION OF ILIAC STENT Left 11/05/2020    INSERTION OF ILIAC STENT (Left )   INSERTION OF ILIAC STENT Left 11/05/2020    Procedure: INSERTION OF ILIAC STENT;  Surgeon: New Gaile LELON, MD;  Location: [facility] OR;  Service: Vascular;  Laterality: Left;   LUMBAR PERCUTANEOUS PEDICLE SCREW 4 LEVEL N/A 05/28/2014   Procedure: LUMBAR PERCUTANEOUS PEDICLE SCREW 4 LEVEL;  Surgeon: Fairy Levels, MD;  Location: [facility] NEURO [facility]S;  Service: Neurosurgery;  Laterality: N/A;   LUMBAR SPINE SURGERY     12/ 1997 and 04/ 2005   NEUROMA SURGERY Right 1996   right foot   PERIPHERAL INTRAVASCULAR LITHOTRIPSY Left 11/05/2020   Procedure: INTRAVASCULAR LITHOTRIPSY;  Surgeon: New Gaile LELON, MD;  Location[facility] OR;  Service: Vascular;  Laterality: Left;  shockwave   PERIPHERAL VASCULAR CATHETERIZATION N/A 12/07/2016   Procedure: Abdominal Aortogram w/Lower Extremity;  Surgeon: Gaile LELON New, MD;  Location: [facility]VAS[facility] CV LAB;  Service: Cardiovascular;  Laterality: N/A;   RECTAL EXAM UNDER ANESTHESIA N/A 09/25/2021   Procedure: ANORECTAL  EXAM UNDER ANESTHESIA;  Surgeon: Teresa Lonni HERO, MD;  Location: [facility];  Service: General;  Laterality: N/A;   RIGHT/LEFT HEART CATH AND CORONARY ANGIOGRAPHY N/A 05/11/2019   Procedure: RIGHT/LEFT HEART CATH AND CORONARY ANGIOGRAPHY;  Surgeon: Cherrie Toribio SAUNDERS, MD;  Location: [facility] INVASIVE CV LAB;  Service: Cardiovascular;  Laterality: N/A;   TONSILLECTOMY  1965    Allergies[1]  Current Outpatient Medications  Medication Sig Dispense Refill   albuterol  (VENTOLIN  HFA) 108 (90 Base) MCG/ACT inhaler Inhale 2 puffs into the lungs every 6 (six) hours as needed for wheezing or shortness of breath.     b complex vitamins capsule Take 1 capsule by mouth daily.     BENFOTIAMINE PO Take 1 tablet by mouth 2 (two) times daily.     bisoprolol  (ZEBETA ) 5 MG tablet Take 1 tablet by mouth every evening.     carboxymethylcellulose 1 % ophthalmic solution Apply 1 drop to eye 3 (three) times daily.     CINNAMON PO Take 1 tablet by mouth daily.     clopidogrel  (PLAVIX ) 75 MG tablet Take 75 mg by  mouth daily.     doxazosin  (CARDURA ) 2 MG tablet Take 2 mg by mouth every evening.     doxycycline  (ADOXA) 50 MG tablet Take 50 mg by mouth daily at 12 noon.     empagliflozin  (JARDIANCE ) 25 MG TABS tablet Take 12.5 mg by mouth daily.     fluticasone  (FLONASE ) 50 MCG/ACT nasal spray Place 2 sprays into both nostrils at bedtime as needed for allergies or rhinitis.      furosemide  (LASIX ) 20 MG tablet Take 20 mg by mouth daily at 12 noon.      gabapentin (NEURONTIN) 100 MG capsule Take 200 mg by mouth every evening.     GARLIC PO Take 1 tablet by mouth daily.     hydrocortisone  2.5 % ointment Apply 1 Application topically daily as needed (skin irritation on face).     insulin  glargine-yfgn (SEMGLEE, YFGN,) 100 UNIT/ML Pen Inject 15 Units into the skin every evening.     Insulin  Pen Needle (PEN NEEDLES) 32G X 4 MM MISC Inject q daily with Rx of Lantus DxE-11.22 for 90 days     isosorbide  mononitrate (IMDUR ) 30 MG 24 hr tablet TAKE 1 TABLET BY MOUTH  DAILY 90 tablet 3   L-LYSINE  PO Take 1 tablet by mouth daily.     loperamide (IMODIUM) 2 MG capsule Take 2 mg by mouth.     loratadine (CLARITIN) 10 MG tablet Take 10 mg by mouth daily.     losartan  (COZAAR ) 100 MG tablet Take 100 mg by mouth daily.     metFORMIN  (GLUCOPHAGE -XR) 500 MG 24 hr tablet Take 1,000 mg by mouth 2 (two) times daily.     Mometasone  Furoate 200 MCG/ACT AERO Take 200 mcg by mouth daily.     Multiple Vitamin (MULTIVITAMIN) tablet Take 1 tablet by mouth daily.     nitroGLYCERIN  (NITROSTAT ) 0.4 MG SL tablet Place 1 tablet (0.4 mg total) under the tongue every 5 (five) minutes as needed for chest pain. 90 tablet 3   Omega-3 Fatty Acids (FISH OIL PO) Take 1 capsule by mouth 2 (two) times daily.     pentoxifylline  (TRENTAL ) 400 MG CR tablet TAKE 1 TABLET BY MOUTH  TWICE DAILY 180 tablet 3   predniSONE (DELTASONE) 10 MG tablet Take 10 mg by mouth daily with breakfast. (Patient not taking: Reported on 07/27/2024)     Probiotic  Product  (PROBIOTIC PO) Take 1 capsule by mouth every evening.     rOPINIRole  (REQUIP ) 1 MG tablet Take 1 mg by mouth every evening.      Semaglutide, 2 MG/DOSE, (OZEMPIC, 2 MG/DOSE,) 8 MG/3ML SOPN Inject 2 mg into the skin See admin instructions. Inject 2 mg once a week on Thursdays.     simvastatin  (ZOCOR ) 40 MG tablet Take 40 mg by mouth every evening.     triamcinolone ointment (KENALOG) 0.1 % Apply 1 Application topically 2 (two) times daily.     umeclidinium-vilanterol (ANORO ELLIPTA ) 62.5-25 MCG/INH AEPB INHALE 1 INHALATION BY  MOUTH INTO THE LUNGS DAILY 180 each 3   No current facility-administered medications for this visit.    Family History  Problem Relation Age of Onset   Heart disease Father 43   Cancer Father    Hyperlipidemia Father    Hypertension Father    Heart attack Father    Cancer Mother    Deep vein thrombosis Mother        Varicose veins   Diabetes Mother    Hyperlipidemia Mother    Hypertension Mother     Social History   Socioeconomic History   Marital status: Married    Spouse name: Aldona   Number of children: 1   Years of education: 13   Highest education level: Not on file  Occupational History   Occupation: retired  Tobacco Use   Smoking status: Former    Current packs/day: 0.00    Average packs/day: 2.0 packs/day for 28.0 years (56.0 ttl pk-yrs)    Types: Cigarettes    Start date: 08/03/1959    Quit date: 08/03/1987    Years since quitting: 37.4   Smokeless tobacco: Never  Vaping Use   Vaping status: Never Used  Substance and Sexual Activity   Alcohol  use: Yes    Comment: socially   Drug use: No   Sexual activity: Yes  Other Topics Concern   Not on file  Social History Narrative   Spend time with family, 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, engineering geologist)   Social Drivers of Health   Tobacco Use: Medium Risk (11/19/2024)   Patient History    Smoking Tobacco Use: Former    Smokeless Tobacco Use: Never    Passive Exposure: Not on Programmer, Applications Strain: Not on file  Food Insecurity: No Food Insecurity (10/05/2023)   Hunger Vital Sign    Worried About Running Out of Food in the Last Year: Never true    Ran Out of Food in the Last Year: Never true  Transportation Needs: No Transportation Needs (10/05/2023)   PRAPARE - Administrator, Civil Service (Medical): No    Lack of Transportation (Non-Medical): No  Physical Activity: Not on file  Stress: Not on file  Social Connections: Not on file  Intimate Partner Violence: Not At Risk (10/05/2023)   Humiliation, Afraid, Rape, and Kick questionnaire    Fear of Current or Ex-Partner: No    Emotionally Abused: No    Physically Abused: No    Sexually Abused: No  Depression (PHQ2-9): Low Risk (01/10/2024)   Depression (PHQ2-9)    PHQ-2 Score: 0  Alcohol  Screen: Not on file  Housing: Low Risk (10/05/2023)   Housing    Last Housing Risk Score: 0  Utilities: Not At Risk (10/05/2023)   AHC Utilities    Threatened with loss of utilities: No  Health Literacy: Not on file  REVIEW OF SYSTEMS:   [X]  denotes positive finding, [ ]  denotes negative finding Cardiac  Comments:  Chest pain or chest pressure:    Shortness of breath upon exertion:    Short of breath when lying flat:    Irregular heart rhythm:        Vascular    Pain in calf, thigh, or hip brought on by ambulation:    Pain in feet at night that wakes you up from your sleep:     Blood clot in your veins:    Leg swelling:         Pulmonary    Oxygen at home:    Productive cough:     Wheezing:         Neurologic    Sudden weakness in arms or legs:     Sudden numbness in arms or legs:     Sudden onset of difficulty speaking or slurred speech:    Temporary loss of vision in one eye:     Problems with dizziness:         Gastrointestinal    Blood in stool:     Vomited blood:         Genitourinary    Burning when urinating:     Blood in urine:        Psychiatric    Major depression:          Hematologic    Bleeding problems:    Problems with blood clotting too easily:        Skin    Rashes or ulcers:        Constitutional    Fever or chills:      PHYSICAL EXAMINATION:  Today's Vitals   12/26/24 0854  BP: 121/62  Pulse: 69  Resp: 20  Temp: 98.8 F (37.1 C)  TempSrc: Temporal  SpO2: 93%  Weight: 233 lb 3.2 oz (105.8 kg)  Height: 5' 8 (1.727 m)  PainSc: 4    Body mass index is 35.46 kg/m.   General:  WDWN in NAD; vital signs documented above Gait: Not observed HENT: WNL, normocephalic Pulmonary: normal non-labored breathing , without wheezing Cardiac: regular HR, without carotid bruits Abdomen: soft, NT; aortic pulse is not palpable Skin: without rashes Vascular Exam/Pulses: Bilateral radial arteries are palpable Brisk monophasic doppler flow bilateral DP/PT Right femoral pulse is difficult to palpate; left femoral pulse is palpable Extremities: without ischemic changes, without Gangrene , without cellulitis; without open wounds Musculoskeletal: no muscle wasting or atrophy  Neurologic: A&O X 3 Psychiatric:  The pt has Normal affect.   Non-Invasive Vascular Imaging:   ABI's/TBI's on 12/26/2024: Right:  0.66/0.34 - Great toe pressure: 44 Left:  1.16/0.82 - Great toe pressure: 106  Arterial duplex on 12/26/2024: Abdominal Aorta Findings:  +-------------+-------+----------+----------+----------+--------+----------  Location    AP (cm)Trans (cm)PSV (cm/s)Waveform  ThrombusComments   +-------------+-------+----------+----------+----------+--------+----------  RT CIA Prox                   119       biphasic                     +-------------+-------+----------+----------+----------+--------+----------  RT CIA Mid                    62        biphasic                     +-------------+-------+----------+----------+----------+--------+----------  RT CIA  Distal                 73        biphasic                      +-------------+-------+----------+----------+----------+--------+----------  RT EIA Prox                   331       monophasic                   +-------------+-------+----------+----------+----------+--------+----------  RT EIA Mid                    226       monophasic                   +-------------+-------+----------+----------+----------+--------+----------  RT EIA Distal                 118       biphasic                     +-------------+-------+----------+----------+----------+--------+----------  LT CIA Prox                   96        biphasic                     +-------------+-------+----------+----------+----------+--------+----------  LT CIA Mid                    128       biphasic                     +-------------+-------+----------+----------+----------+--------+----------   LT CIA Distal                                             not visualized  +-------------+-------+----------+----------+----------+--------+----------  LT EIA Prox                   148       biphasic                     +-------------+-------+----------+----------+----------+--------+----------  LT EIA Mid                    201       biphasic                     +-------------+-------+----------+----------+----------+--------+----------  LT EIA Distal                 150       biphasic                     +-------------+-------+----------+----------+----------+--------+----------   Summary:  Stenosis:  >50% right EIA stenosis. Patent left CIA and EIA stents where visualized.   Previous ABI's/TBI's on 10/03/2023: Right:  0.64/0.39 - Great toe pressure: 53 Left:  1.11/0.72 - Great toe pressure:  98  Previous arterial duplex on 10/03/2023: Patent left CIA / EIA stent without obvious evidence of stenosis.     ASSESSMENT/PLAN:: 78 y.o. male here for follow up for PAD and carotid artery stenosis Vascular hx: -right external iliac,  common femoral, and superficial femoral artery endarterectomy with bovine pericardial patch angioplasty on  10/10/2014 by Dr. Serene for right leg claudication. - Extensive endarterectomy of the left external iliac, common femoral, superficial femoral, and profundofemoral artery, bovine pericardial patch angioplasty of the left common femoral and profundofemoral artery, separate bovine pericardial patch angioplasty of the right superficial femoral artery, arterial lithotripsy of the left common and left external iliac artery, stent, left common and left external iliac artery 11/05/2020 Dr. Serene  PAD -pt denies claudication, rest pain or non healing wounds. -his ABI is essentially unchanged.  His left iliac stent is patent.  The right EIA has increased velocities and more increased since last visit.  Discussed with Dr. Sheree and given, this is native artery and he is asymptomatic, will have him f/u in 6 months and see Dr. Serene.   -continue statin/plavix  -discussed importance of increased walking daily -pt will f/u in 6 months with aortoiliac duplex and ABI and see Dr. Serene.  He knows to call sooner if any issues before then.   Carotid artery stenosis -pt seen last month and right ICA stenosis of 40-59% and left 1-39%. -plan for carotid duplex in one year    Lucie Apt, [facility] [phone]  Clinic MD:   Sheree     [1]  Allergies Allergen Reactions   Bovine (Beef) Protein-Containing Drug Products Diarrhea   Porcine (Pork) Protein-Containing Drug Products Diarrhea   Spiriva  Respimat [Tiotropium Bromide ] Other (See Comments)    Throat irritation   "

## 2024-12-26 ENCOUNTER — Ambulatory Visit (INDEPENDENT_AMBULATORY_CARE_PROVIDER_SITE_OTHER): Admitting: Physician Assistant

## 2024-12-26 ENCOUNTER — Ambulatory Visit (HOSPITAL_COMMUNITY)
Admission: RE | Admit: 2024-12-26 | Discharge: 2024-12-26 | Disposition: A | Discharge status: Home / Self Care | Source: Ambulatory Visit | Attending: Surgery | Admitting: Surgery

## 2024-12-26 ENCOUNTER — Encounter: Payer: Self-pay | Admitting: Physician Assistant

## 2024-12-26 ENCOUNTER — Ambulatory Visit (HOSPITAL_BASED_OUTPATIENT_CLINIC_OR_DEPARTMENT_OTHER)
Admission: RE | Admit: 2024-12-26 | Discharge: 2024-12-26 | Disposition: A | Discharge status: Home / Self Care | Source: Ambulatory Visit | Attending: Surgery | Admitting: Surgery

## 2024-12-26 VITALS — BP 121/62 | HR 69 | Temp 98.8°F | Resp 20 | Ht 68.0 in | Wt 233.2 lb

## 2024-12-26 DIAGNOSIS — I739 Peripheral vascular disease, unspecified: Secondary | ICD-10-CM | POA: Insufficient documentation

## 2024-12-26 DIAGNOSIS — I70213 Atherosclerosis of native arteries of extremities with intermittent claudication, bilateral legs: Secondary | ICD-10-CM | POA: Insufficient documentation

## 2024-12-26 LAB — VAS US ABI WITH/WO TBI
Left ABI: 1.16
Right ABI: 0.66

## 2025-01-02 ENCOUNTER — Other Ambulatory Visit: Payer: Self-pay

## 2025-01-02 DIAGNOSIS — I70213 Atherosclerosis of native arteries of extremities with intermittent claudication, bilateral legs: Secondary | ICD-10-CM

## 2025-01-15 ENCOUNTER — Telehealth (HOSPITAL_COMMUNITY): Payer: Self-pay | Admitting: Cardiology

## 2025-01-15 NOTE — Telephone Encounter (Signed)
 Called to confirm/remind patient of their appointment at the [facility] on 01/15/25.   Appointment:   [x] Confirmed  [] Left mess   [] No answer/No voice mail  [] VM Full/unable to leave message  [] Phone not in service  Patient reminded to bring all medications and/or complete list.  Confirmed patient has transportation. Gave directions, instructed to utilize valet parking.

## 2025-01-16 ENCOUNTER — Ambulatory Visit (HOSPITAL_COMMUNITY)
Admission: RE | Admit: 2025-01-16 | Discharge: 2025-01-16 | Disposition: A | Discharge status: Home / Self Care | Source: Ambulatory Visit | Attending: Cardiology | Admitting: Cardiology

## 2025-01-16 VITALS — BP 108/68 | HR 59 | Wt 236.4 lb

## 2025-01-16 DIAGNOSIS — Z7902 Long term (current) use of antithrombotics/antiplatelets: Secondary | ICD-10-CM | POA: Insufficient documentation

## 2025-01-16 DIAGNOSIS — G4733 Obstructive sleep apnea (adult) (pediatric): Secondary | ICD-10-CM | POA: Diagnosis not present

## 2025-01-16 DIAGNOSIS — I739 Peripheral vascular disease, unspecified: Secondary | ICD-10-CM | POA: Diagnosis not present

## 2025-01-16 DIAGNOSIS — Z794 Long term (current) use of insulin: Secondary | ICD-10-CM | POA: Diagnosis not present

## 2025-01-16 DIAGNOSIS — Z7984 Long term (current) use of oral hypoglycemic drugs: Secondary | ICD-10-CM | POA: Diagnosis not present

## 2025-01-16 DIAGNOSIS — R42 Dizziness and giddiness: Secondary | ICD-10-CM | POA: Insufficient documentation

## 2025-01-16 DIAGNOSIS — R9431 Abnormal electrocardiogram [ECG] [EKG]: Secondary | ICD-10-CM | POA: Diagnosis not present

## 2025-01-16 DIAGNOSIS — I2510 Atherosclerotic heart disease of native coronary artery without angina pectoris: Secondary | ICD-10-CM | POA: Diagnosis not present

## 2025-01-16 DIAGNOSIS — J449 Chronic obstructive pulmonary disease, unspecified: Secondary | ICD-10-CM | POA: Insufficient documentation

## 2025-01-16 DIAGNOSIS — N1831 Chronic kidney disease, stage 3a: Secondary | ICD-10-CM | POA: Insufficient documentation

## 2025-01-16 DIAGNOSIS — Z7985 Long-term (current) use of injectable non-insulin antidiabetic drugs: Secondary | ICD-10-CM | POA: Diagnosis not present

## 2025-01-16 DIAGNOSIS — I110 Hypertensive heart disease with heart failure: Secondary | ICD-10-CM | POA: Diagnosis present

## 2025-01-16 DIAGNOSIS — E1151 Type 2 diabetes mellitus with diabetic peripheral angiopathy without gangrene: Secondary | ICD-10-CM | POA: Diagnosis not present

## 2025-01-16 DIAGNOSIS — Z79899 Other long term (current) drug therapy: Secondary | ICD-10-CM | POA: Diagnosis not present

## 2025-01-16 DIAGNOSIS — I5032 Chronic diastolic (congestive) heart failure: Secondary | ICD-10-CM

## 2025-01-16 DIAGNOSIS — E785 Hyperlipidemia, unspecified: Secondary | ICD-10-CM | POA: Insufficient documentation

## 2025-01-16 DIAGNOSIS — Z7951 Long term (current) use of inhaled steroids: Secondary | ICD-10-CM | POA: Insufficient documentation

## 2025-01-16 NOTE — Patient Instructions (Signed)
" °  Follow-Up in: 6 months with Dr. Cherrie PLEASE CALL OUR OFFICE AROUND May 2026 TO GET SCHEDULED FOR YOUR APPOINTMENT. PHONE NUMBER IS [phone] OPTION 2   At the [facility], you and your health needs are our priority. We have a designated team specialized in the treatment of Heart Failure. This Care Team includes your primary Heart Failure Specialized Cardiologist (physician), Advanced Practice Providers (APPs- Physician Assistants and Nurse Practitioners), and Pharmacist who all work together to provide you with the care you need, when you need it.   You may see any of the following providers on your designated Care Team at your next follow up:  Dr. Toribio Cherrie Dr. Ezra Shuck Dr. Odis Brownie Greig Mosses, NP Caffie Shed, [place] [facility] [place], [place] Beckey Coe, NP Jordan Lee, NP Tinnie Redman, PharmD   Please be sure to bring in all your medications bottles to every appointment.   Need to Contact Us :  If you have any questions or concerns before your next appointment please send us  a message through [place] or call our office at [phone].    TO LEAVE A MESSAGE FOR THE NURSE SELECT OPTION 2, PLEASE LEAVE A MESSAGE INCLUDING: YOUR NAME DATE OF BIRTH CALL BACK NUMBER REASON FOR CALL**this is important as we prioritize the call backs  YOU WILL RECEIVE A CALL BACK THE SAME DAY AS LONG AS YOU CALL BEFORE 4:00 PM  "

## 2025-01-16 NOTE — Progress Notes (Signed)
 "    [facility]   Date:  01/16/2025   ID:  Anthony [name], DOB Oct 10, 1946, MRN [id number]  Location: Home  Provider location: [facility] Type of Visit: Established patient  Reason for visit: f/u for chronic diastolic heart failure   PCP:  Onita Rush, MD  HF Cardiologist: Bensimhon  HPI: Anthony [name] is a 79 y.o. male with h/o obesity, HTN, HL, severe osteoarthritis, DM2 (diagnosed 2009), OSA, former smoker (quit 1989), CAD s/p stent 2001 and PAD. He is the father-in-law of Dr. Juliene Balder in Interventional Radiology.    Had cath in [place]  in 2001. Was told he had 70% blockage in one artery and the one in the back was totally blocked. Eventually underwent stenting.    Has severe PAD R>L followed by Dr. Serene. Now status post right external iliac, common femoral, superficial femoral endarterectomy with bovine pericardial patch angioplasty on 10/10/2014  Saw Dr. Shellia and placed on home O2 but now weaned off with inhalers. Checks sats with pulse oximeter   Echo 8/20 EF 50% RV ok.   Was having more SOB and some CP. Underwent repeat cath 05/11/19  1. Left dominant coronary system with 2V CAD 2. Small dominant RCA with chronic proximal total occlusion 3. Widely patent dominant LCX 4. High grade 95% proximal LAD likely within or just prior to previous stent. Probable chronic occlusion of distal LAD but not seen well -> PCI/DES to prox LAD and POBA of D1 5. LVEF 60-65% with normal filling pressures 6. Normal right heart pressures   11/21 Had extensive endarterectomy of left external iliac, CFA and SFA with patch angioplasty and stenting.   Sleep study with severe OS - AHI 39.    PFT 11/28/19 >> FEV1 2.30 (78%), FEV1% 75, TLC 5.84 (85%), DLCO 70%, no BD  Had left temporal CVA 10/03/23, treated medically. Echo 10/24: EF 55-60%, normal RV  Today he returns for HF follow up with his wife. Doing well. No complaints. Denies resting  dyspnea. Chronically SOB w/ moderate activity, NYHA Class II but stable. No LEE. Denies ischemic CP. BP well controlled, 108/68. He notes some occasional positional dizziness, but not frequent.   EKG shows NSR 60 bpm, personally reviewed.      Past Medical History:  Diagnosis Date   Atherosclerosis of native artery of both lower extremities with intermittent claudication    followed by vascular , dr serene;  s/p angioplasty and stenting bilaterally 2015 and 11/ 2021 (per lov note 06-08-2021 bilateral iliofemoral intervention's are widely patent)   CAD (coronary artery disease)    cardiologist-- dr cherrie;  s/p stent placed 06/02/2000 in [place] ;  s/p cath with patent LCx , PCI w/ DES to pLAD and PCI to POBA of D1   Chronic diastolic (congestive) heart failure (HCC)    followed by cardiology   COPD (chronic obstructive pulmonary disease) with chronic bronchitis [facility])    pulmonology-- dr shellia--  (09-23-2021  per pt checks O2 stats at home, [place] 96-97% on RA)   Edema of left lower extremity    per pt since vasuclar surgery 11/ 2021, but told mild   History of COVID-19 06/2021   per pt mild symtpoms that resolved   Hyperlipidemia    Hypertension    followed by cardiology and pcp   OA (osteoarthritis)    OSA on CPAP    followed by dr shellia--  study in epic 08-23-2012 severe osa (no additional oxygen)  PAD (peripheral artery disease)    Peripheral neuropathy    feet   Peripheral vascular disease    Rosacea    S/P drug eluting coronary stent placement    2001  x1 stent (unsure if DES);  05/ 2020 DES to pLAD   Shortness of breath    with exertion with stairs but recovers quickly,  ok w/ household chores   Type 2 diabetes mellitus (HCC)    followed by pcp   (09-23-2021 per pt check blood sugar at home twice weekly in am,  fasting sugar-- 170)   Past Surgical History:  Procedure Laterality Date   ABDOMINAL AORTAGRAM N/A 11/29/2012   Procedure: ABDOMINAL AORTAGRAM;  Surgeon:  Gaile LELON New, MD;  Location: [facility] CATH LAB;  Service: Cardiovascular;  Laterality: N/A;   ABDOMINAL AORTAGRAM N/A 09/03/2014   Procedure: ABDOMINAL EZELLA;  Surgeon: Gaile LELON New, MD;  Location: [facility] CATH LAB;  Service: Cardiovascular;  Laterality: N/A;   ABDOMINAL AORTOGRAM W/LOWER EXTREMITY N/A 09/16/2020   Procedure: ABDOMINAL AORTOGRAM W/LOWER EXTREMITY;  Surgeon: New Gaile LELON, MD;  Location: [facility] INVASIVE CV LAB;  Service: Cardiovascular;  Laterality: N/A;   ANTERIOR CERVICAL DECOMP/DISCECTOMY FUSION  10/2001   C5--C7   ANTERIOR LATERAL LUMBAR FUSION 4 LEVELS Right 05/28/2014   Procedure: Right L4-5 L3-4 L2-3, L1-2  Anterior lateral lumbar fusion with percutaneaous pedicle screws. Lumbar four/five, three/four, two/three and possible two/one;  Surgeon: Fairy Levels, MD;  Location: [facility] NEURO ORS;  Service: Neurosurgery;  Laterality: Right;  Lumbar One-Five Fusion with Percutaneous Screws   BUNIONECTOMY Right    1991 and 1994   CARDIAC CATHETERIZATION  03/26/1999   in [place] ;  per pt medically managed , no intervention   CARPAL TUNNEL RELEASE Right 09/30/2006   CATARACT EXTRACTION W/ INTRAOCULAR LENS IMPLANT Bilateral 2014   COLONOSCOPY  08/26/2020   CORONARY ANGIOPLASTY WITH STENT PLACEMENT  06/02/2000   in [place] ; x1 stent   CORONARY STENT INTERVENTION N/A 05/11/2019   Procedure: CORONARY STENT INTERVENTION;  Surgeon: Verlin Lonni BIRCH, MD;  Location: [facility] INVASIVE CV LAB;  Service: Cardiovascular;  Laterality: N/A;   ENDARTERECTOMY FEMORAL Right 10/10/2014   Procedure: RIGHT FEMORAL ARTERY ENDARTERECTOMY  WITH VASCU GUARD PATCH ANGIOPLASTY;  Surgeon: Gaile LELON New, MD;  Location: [facility] OR;  Service: Vascular;  Laterality: Right;   ENDARTERECTOMY FEMORAL Left 11/05/2020   Procedure: LEFT FEMORAL ENDARTERECTOMY WITH PATCH ANGIOPLASTY;  Surgeon: New Gaile LELON, MD;  Location: [facility] OR;  Service: Vascular;  Laterality: Left;   FEMORAL ENDARTERECTOMY Left 11/05/2020   LEFT  FEMORAL ENDARTERECTOMY WITH PATCH ANGIOPLASTY (Left    FINGER ARTHRODESIS Right 09/2009   right thumb   HAMMER TOE SURGERY Left    06/ 2007 and 08/ 2008   HYDRADENITIS EXCISION N/A 09/25/2021   Procedure: EXCISION HIDRADENITIS, INTERROGATION OF PERINEAL WOUND;  Surgeon: Teresa Lonni HERO, MD;  Location: [facility];  Service: General;  Laterality: N/A;   INSERTION OF ILIAC STENT Left 11/05/2020    INSERTION OF ILIAC STENT (Left )   INSERTION OF ILIAC STENT Left 11/05/2020   Procedure: INSERTION OF ILIAC STENT;  Surgeon: New Gaile LELON, MD;  Location: [facility] OR;  Service: Vascular;  Laterality: Left;   LUMBAR PERCUTANEOUS PEDICLE SCREW 4 LEVEL N/A 05/28/2014   Procedure: LUMBAR PERCUTANEOUS PEDICLE SCREW 4 LEVEL;  Surgeon: Fairy Levels, MD;  Location: [facility]O [facility];  Service: Neurosurgery;  Laterality: N/A;   LUMBAR SPINE SURGERY     12/ 1997 and 04/ 2005  NEUROMA SURGERY Right 1996   right foot   PERIPHERAL INTRAVASCULAR LITHOTRIPSY Left 11/05/2020   Procedure: INTRAVASCULAR LITHOTRIPSY;  Surgeon: Serene Gaile ORN, MD;  Location: [facility] OR;  Service: Vascular;  Laterality: Left;  shockwave   PERIPHERAL VASCULAR CATHETERIZATION N/A 12/07/2016   Procedure: Abdominal Aortogram w/Lower Extremity;  Surgeon: Gaile ORN Serene, MD;  Location: [facility] CV LAB;  Service: Cardiovascular;  Laterality: N/A;   RECTAL EXAM UNDER ANESTHESIA N/A 09/25/2021   Procedure: ANORECTAL EXAM UNDER ANESTHESIA;  Surgeon: Teresa Lonni HERO, MD;  Location: [facility];  Service: General;  Laterality: N/A;   RIGHT/LEFT HEART CATH AND CORONARY ANGIOGRAPHY N/A 05/11/2019   Procedure: RIGHT/LEFT HEART CATH AND CORONARY ANGIOGRAPHY;  Surgeon: Cherrie Toribio SAUNDERS, MD;  Location: [facility] INVAS[facility] CV LAB;  Service: Cardiovascular;  Laterality: N/A;   TONSILLECTOMY  1965   Current Outpatient Medications  Medication Sig Dispense Refill   albuterol  (VENTOLIN  HFA) 108 (90 Base) MCG/ACT inhaler Inhale 2  puffs into the lungs every 6 (six) hours as needed for wheezing or shortness of breath.     b complex vitamins capsule Take 1 capsule by mouth daily.     BENFOTIAMINE PO Take 1 tablet by mouth 2 (two) times daily.     bisoprolol  (ZEBETA ) 5 MG tablet Take 1 tablet by mouth every evening.     carboxymethylcellulose 1 % ophthalmic solution Apply 1 drop to eye 3 (three) times daily.     CINNAMON PO Take 1 tablet by mouth daily.     clopidogrel  (PLAVIX ) 75 MG tablet Take 75 mg by mouth daily.     doxazosin  (CARDURA ) 2 MG tablet Take 2 mg by mouth every evening.     doxycycline  (ADOXA) 50 MG tablet Take 50 mg by mouth daily at 12 noon.     empagliflozin  (JARDIANCE ) 25 MG TABS tablet Take 12.5 mg by mouth daily.     fluticasone  (FLONASE ) 50 MCG/ACT nasal spray Place 2 sprays into both nostrils at bedtime as needed for allergies or rhinitis.      furosemide  (LASIX ) 20 MG tablet Take 20 mg by mouth daily at 12 noon.      gabapentin (NEURONTIN) 100 MG capsule Take 200 mg by mouth every evening.     GARLIC PO Take 1 tablet by mouth daily.     hydrocortisone  2.5 % ointment Apply 1 Application topically daily as needed (skin irritation on face).     insulin  glargine-yfgn (SEMGLEE, YFGN,) 100 UNIT/ML Pen Inject 15 Units into the skin every evening.     Insulin  Pen Needle (PEN NEEDLES) 32G X 4 MM MISC Inject q daily with Rx of Lantus DxE-11.22 for 90 days     isosorbide  mononitrate (IMDUR ) 30 MG 24 hr tablet TAKE 1 TABLET BY MOUTH  DAILY 90 tablet 3   L-LYSINE  PO Take 1 tablet by mouth daily.     lidocaine  (LIDODERM ) 5 % Place 1 patch onto the skin daily.     loperamide (IMODIUM) 2 MG capsule Take 2 mg by mouth.     loratadine (CLARITIN) 10 MG tablet Take 10 mg by mouth daily.     losartan  (COZAAR ) 100 MG tablet Take 100 mg by mouth daily.     metFORMIN  (GLUCOPHAGE -XR) 500 MG 24 hr tablet Take 1,000 mg by mouth 2 (two) times daily.     Mometasone  Furoate 200 MCG/ACT AERO Take 200 mcg by mouth daily.      Multiple Vitamin (MULTIVITAMIN) tablet Take 1 tablet by mouth daily.  nitroGLYCERIN  (NITROSTAT ) 0.4 MG SL tablet Place 1 tablet (0.4 mg total) under the tongue every 5 (five) minutes as needed for chest pain. 90 tablet 3   Omega-3 Fatty Acids (FISH OIL PO) Take 1 capsule by mouth 2 (two) times daily.     pentoxifylline  (TRENTAL ) 400 MG CR tablet TAKE 1 TABLET BY MOUTH  TWICE DAILY 180 tablet 3   Probiotic Product (PROBIOTIC PO) Take 1 capsule by mouth every evening.     rOPINIRole  (REQUIP ) 1 MG tablet Take 1 mg by mouth every evening.      Semaglutide, 2 MG/DOSE, (OZEMPIC, 2 MG/DOSE,) 8 MG/3ML SOPN Inject 2 mg into the skin See admin instructions. Inject 2 mg once a week on Thursdays.     simvastatin  (ZOCOR ) 40 MG tablet Take 40 mg by mouth every evening.     tiZANidine (ZANAFLEX) 4 MG capsule Take 4 mg by mouth 3 (three) times daily.     triamcinolone ointment (KENALOG) 0.1 % Apply 1 Application topically 2 (two) times daily.     umeclidinium-vilanterol (ANORO ELLIPTA ) 62.5-25 MCG/INH AEPB INHALE 1 INHALATION BY  MOUTH INTO THE LUNGS DAILY 180 each 3   No current facility-administered medications for this encounter.   Allergies:   Bovine (beef) protein-containing drug products, Porcine (pork) protein-containing drug products, and Spiriva  respimat [tiotropium bromide ]   Social History:  The patient  reports that he quit smoking about 37 years ago. His smoking use included cigarettes. He started smoking about 65 years ago. He has a 56 pack-year smoking history. He has never used smokeless tobacco. He reports current alcohol  use. He reports that he does not use drugs.   Family History:  The patient's family history includes Cancer in his father and mother; Deep vein thrombosis in his mother; Diabetes in his mother; Heart attack in his father; Heart disease (age of onset: 69) in his father; Hyperlipidemia in his father and mother; Hypertension in his father and mother.   ROS:  Please see the  history of present illness.   All other systems are personally reviewed and negative.   Wt Readings from Last 3 Encounters:  01/16/25 107.2 kg (236 lb 6.4 oz)  12/26/24 105.8 kg (233 lb 3.2 oz)  11/19/24 105.5 kg (232 lb 9.6 oz)   BP 108/68   Pulse (!) 59   Wt 107.2 kg (236 lb 6.4 oz)   SpO2 95%   BMI 35.94 kg/m   Physical Exam:   GENERAL: NAD Lungs- clear  CARDIAC:  JVP not elevated          Normal rate with regular rhythm. no murmur. No LE edema.  ABDOMEN: Soft, non-tender, non-distended.  EXTREMITIES: Warm and well perfused.  NEUROLOGIC: No obvious FND    Assessment & Plan:  1) Chronic Diastolic HF:   - Echo 5/16: EF 45-50% with mild RV dysfunction  - Echo 8/20: EF 50% RV ok. - Echo 10/24: EF 55-60% RV ok  - Stable NYHA Class II, confounded by COPD - Volume ok, Euvolemic on exam  - Continue Lasix  20 mg daily - Continue Jardiance  12.5 mg daily. - Continue losartan  100 mg daily. - Labs followed by PCP, has upcomming appt   2) CAD - Last cath 05/11/19 with PCI to LAD with DES and POBA of D1 - stable w/o chest pain  - Continue Plavix /statin/Jardiance   3) PAD - s/p R femoral artery endarterectomy in 2015. - 11/21 Had extensive endarterectomy of left external iliac, CFA and SFA with patch angioplasty and  stenting.  - denies claudication  - Followed by Dr. Serene   4) HTN - well controlled on current regimen - no change   5) OSA on CPAP - compliant w/ CPAP   6) Hyperlipidemia  - Followed by Dr. Onita.  - Continue simvastatin  40 mg daily. - Goal LDL < 55, most recent LDL 52  7) COPD - Follows with Dr. Tor at [facility]  8). CKD 3a - Scr 1.3-1.4 - Continue Jardiance  - labs followed by PCP and nephrology   F/u in 6 months w/ Dr. Cherrie   Signed, Caffie Shed, PA-C  01/16/2025 2:44 PM  [facility] [address] [facility] [place] [address] [phone]  (office) [phone] (fax) "

## 2025-06-24 ENCOUNTER — Ambulatory Visit (HOSPITAL_COMMUNITY)

## 2025-06-24 ENCOUNTER — Ambulatory Visit: Admitting: Surgery
